# Patient Record
Sex: Female | Born: 1945 | Hispanic: No | Marital: Married | Smoking: Never smoker
Health system: Southern US, Community
[De-identification: ages and names within clinical notes are randomized; demographics above are authoritative.]

## PROBLEM LIST (undated history)

## (undated) DIAGNOSIS — I10 Essential (primary) hypertension: Secondary | ICD-10-CM

## (undated) DIAGNOSIS — D649 Anemia, unspecified: Secondary | ICD-10-CM

## (undated) HISTORY — PX: ABDOMINAL HYSTERECTOMY: SHX81

---

## 2009-07-20 ENCOUNTER — Ambulatory Visit: Payer: Self-pay | Admitting: Internal Medicine

## 2014-09-28 ENCOUNTER — Ambulatory Visit: Payer: Self-pay | Admitting: Family Medicine

## 2014-09-28 IMAGING — US US EXTREM LOW VENOUS*R*
1 series · 13 of 24 positions shown · non-contrast
Comparison: None.

CLINICAL DATA: Right lower extremity swelling and pain



[Series 1: us extrem low venous*right* · 0.06mm/px · 13 of 46 slices shown]
[im 1/46]
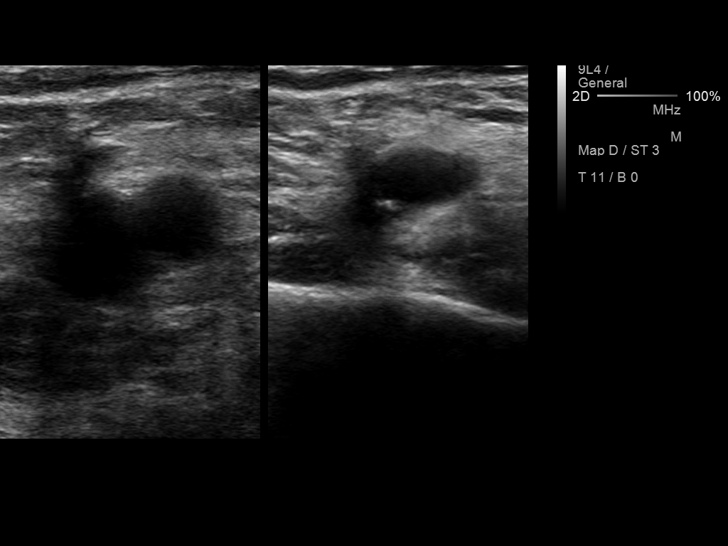
[im 4/46]
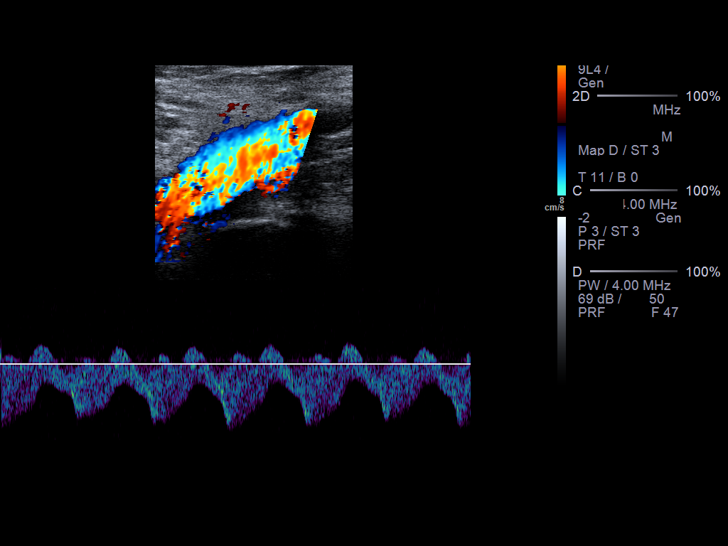
[im 8/46]
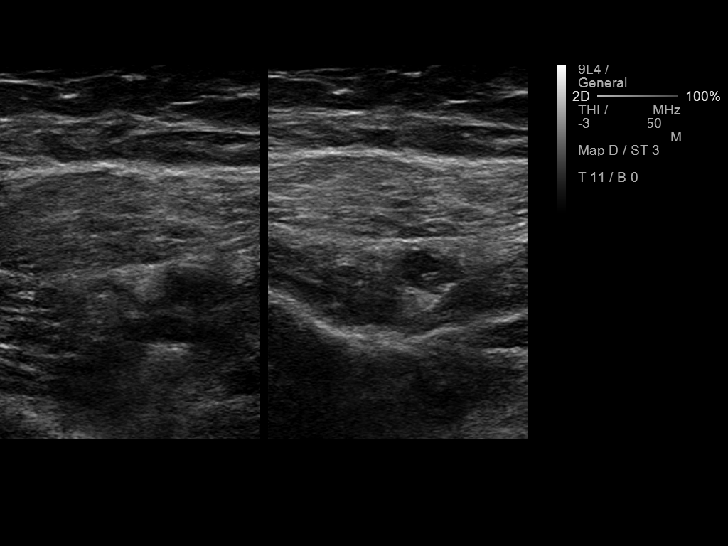
[im 12/46]
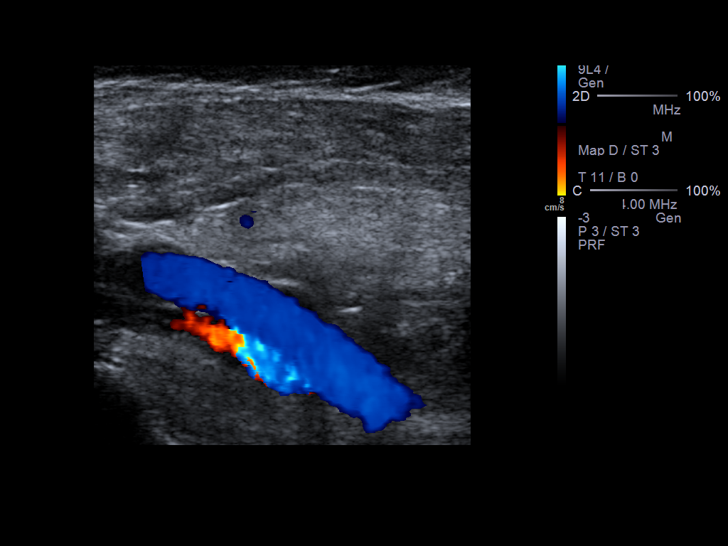
[im 16/46]
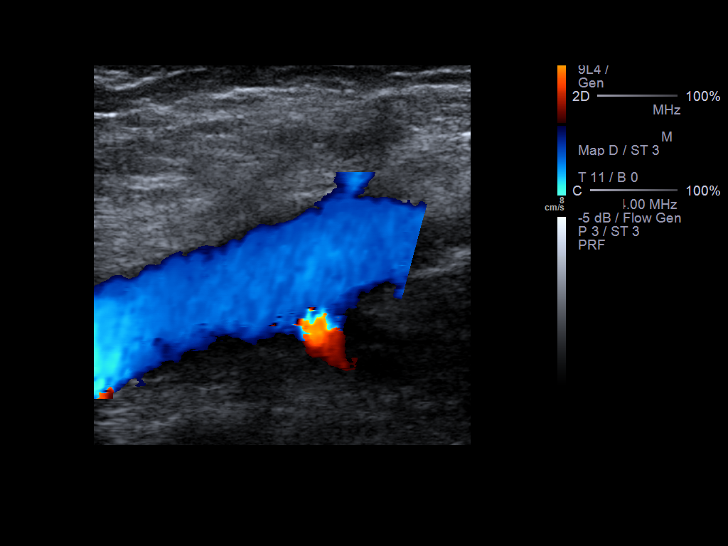
[im 20/46]
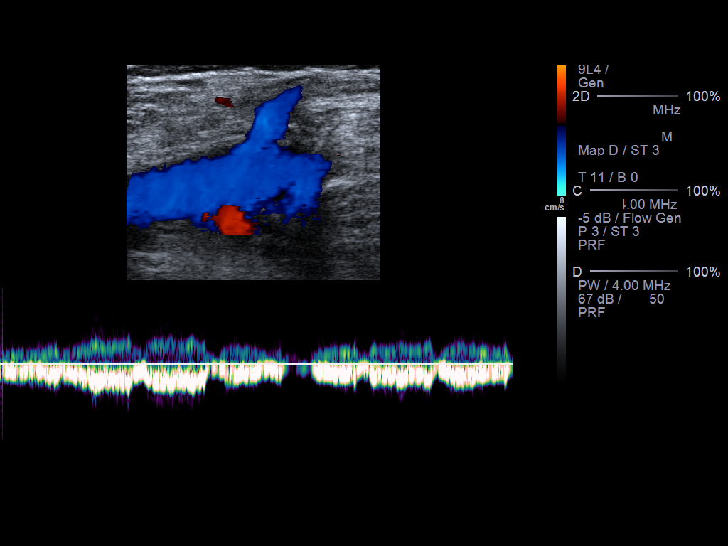
[im 24/46]
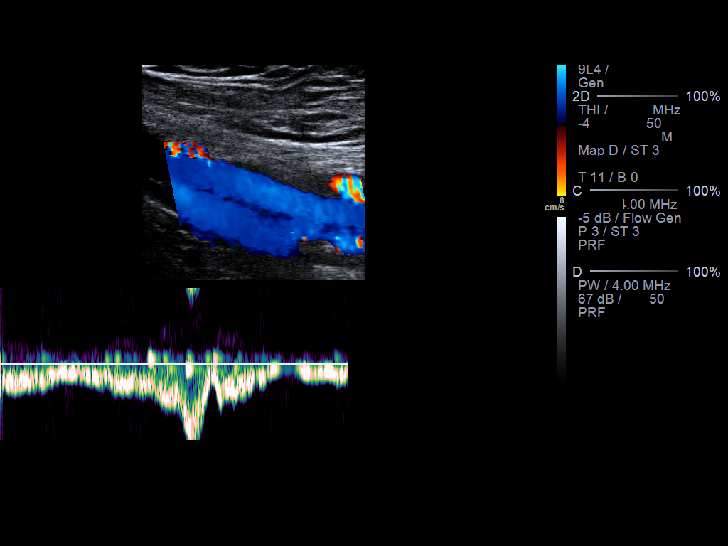
[im 26/46]
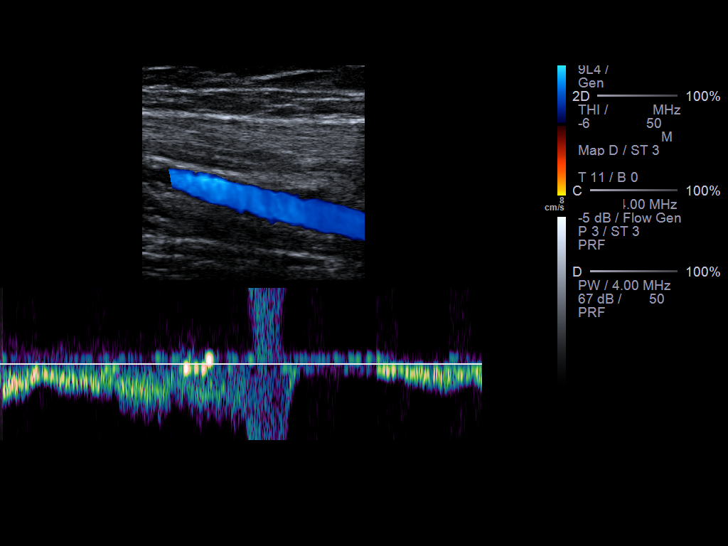
[im 30/46]
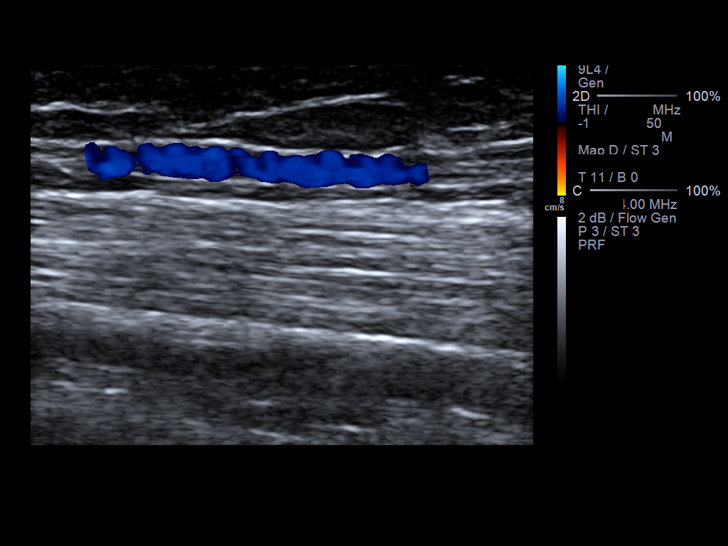
[im 34/46]
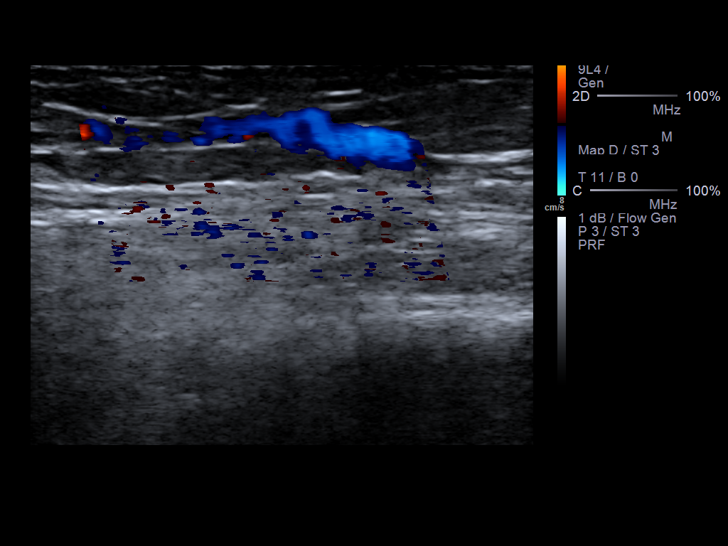
[im 38/46]
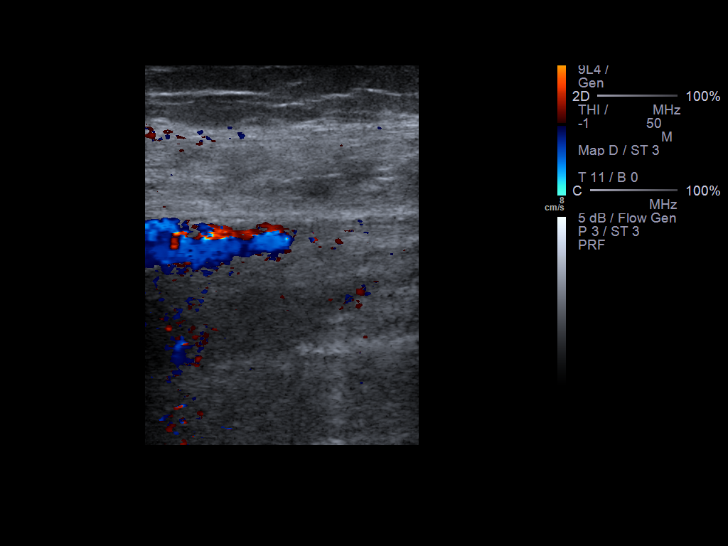
[im 42/46]
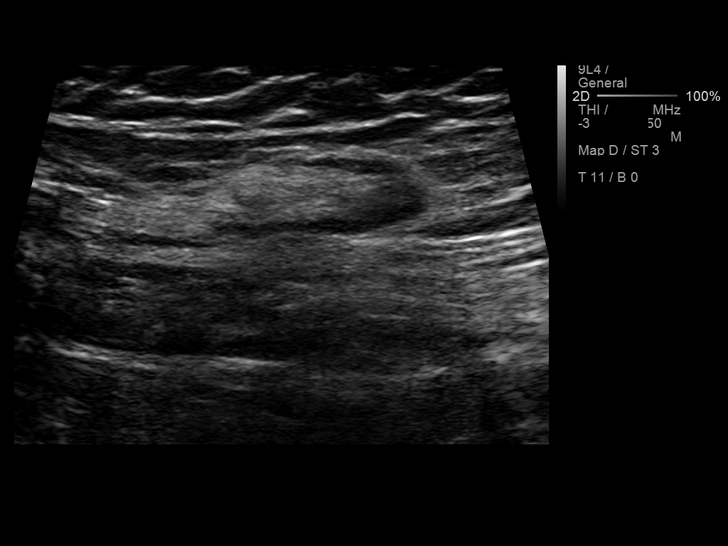
[im 46/46]
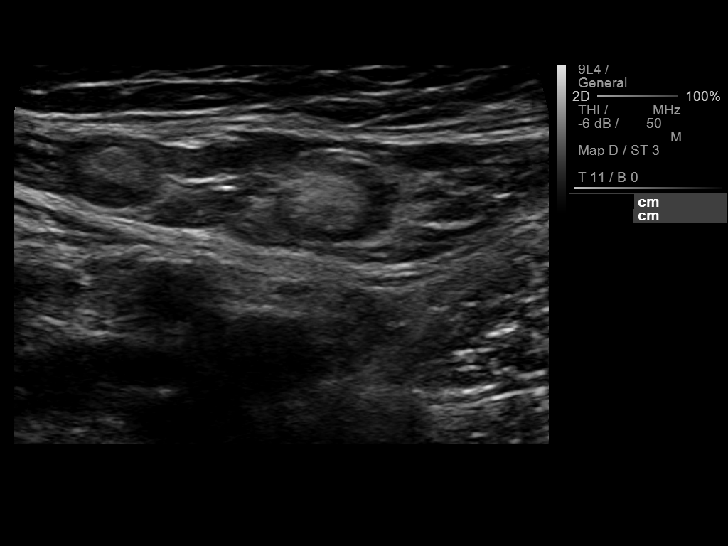

[13 of 24 positions shown; findings below may reference images not displayed]

FINDINGS: Contralateral Common Femoral Vein: Respiratory phasicity is normal
and symmetric with the symptomatic side. No evidence of thrombus.
Normal compressibility.

Common Femoral Vein: No evidence of thrombus. Normal
compressibility, respiratory phasicity and response to augmentation.

Saphenofemoral Junction: No evidence of thrombus. Normal
compressibility and flow on color Doppler imaging.

Profunda Femoral Vein: No evidence of thrombus. Normal
compressibility and flow on color Doppler imaging.

Femoral Vein: No evidence of thrombus. Normal compressibility,
respiratory phasicity and response to augmentation.

Popliteal Vein: No evidence of thrombus. Normal compressibility,
respiratory phasicity and response to augmentation.

Calf Veins: No evidence of thrombus. Normal compressibility and flow
on color Doppler imaging.

Superficial Great Saphenous Vein: No evidence of thrombus. Normal
compressibility and flow on color Doppler imaging.

Venous Reflux:  None.

Other Findings:  None.
IMPRESSION: No evidence of deep venous thrombosis.

These results will be called to the ordering clinician or
representative by the Radiologist Assistant, and communication
documented in the PACS or zVision Dashboard.

## 2014-12-25 ENCOUNTER — Emergency Department: Payer: Self-pay | Admitting: Emergency Medicine

## 2014-12-25 LAB — BASIC METABOLIC PANEL
ANION GAP: 8 (ref 7–16)
BUN: 15 mg/dL (ref 7–18)
Calcium, Total: 9.2 mg/dL (ref 8.5–10.1)
Chloride: 105 mmol/L (ref 98–107)
Co2: 28 mmol/L (ref 21–32)
Creatinine: 0.78 mg/dL (ref 0.60–1.30)
Glucose: 108 mg/dL — ABNORMAL HIGH (ref 65–99)
OSMOLALITY: 283 (ref 275–301)
Potassium: 3.5 mmol/L (ref 3.5–5.1)
Sodium: 141 mmol/L (ref 136–145)

## 2014-12-25 LAB — CBC WITH DIFFERENTIAL/PLATELET
Basophil #: 0.1 10*3/uL (ref 0.0–0.1)
Basophil %: 0.7 %
EOS ABS: 0.2 10*3/uL (ref 0.0–0.7)
Eosinophil %: 2.5 %
HCT: 40.9 % (ref 35.0–47.0)
HGB: 13.2 g/dL (ref 12.0–16.0)
LYMPHS PCT: 19 %
Lymphocyte #: 1.6 10*3/uL (ref 1.0–3.6)
MCH: 29 pg (ref 26.0–34.0)
MCHC: 32.3 g/dL (ref 32.0–36.0)
MCV: 90 fL (ref 80–100)
Monocyte #: 0.5 x10 3/mm (ref 0.2–0.9)
Monocyte %: 6 %
NEUTROS PCT: 71.8 %
Neutrophil #: 6.2 10*3/uL (ref 1.4–6.5)
Platelet: 224 10*3/uL (ref 150–440)
RBC: 4.56 10*6/uL (ref 3.80–5.20)
RDW: 14.6 % — ABNORMAL HIGH (ref 11.5–14.5)
WBC: 8.7 10*3/uL (ref 3.6–11.0)

## 2014-12-25 LAB — TROPONIN I
Troponin-I: <0.02 ng/mL
Troponin-I: <0.02 ng/mL

## 2015-05-08 ENCOUNTER — Other Ambulatory Visit: Payer: Self-pay | Admitting: Family Medicine

## 2015-05-08 ENCOUNTER — Ambulatory Visit
Admission: RE | Admit: 2015-05-08 | Discharge: 2015-05-08 | Disposition: A | Discharge status: Home / Self Care | Payer: Medicare Other | Source: Ambulatory Visit | Attending: Family Medicine | Admitting: Family Medicine

## 2015-05-08 DIAGNOSIS — Z1231 Encounter for screening mammogram for malignant neoplasm of breast: Secondary | ICD-10-CM

## 2015-05-08 IMAGING — MG MM DIGITAL SCREENING BILAT W/ CAD
5 series · 5 of 5 positions shown · non-contrast
Comparison: Outside prior exams from [place] are not available

CLINICAL DATA: Screening.

EXAM:
DIGITAL SCREENING BILATERAL MAMMOGRAM WITH CAD

[R MLO]
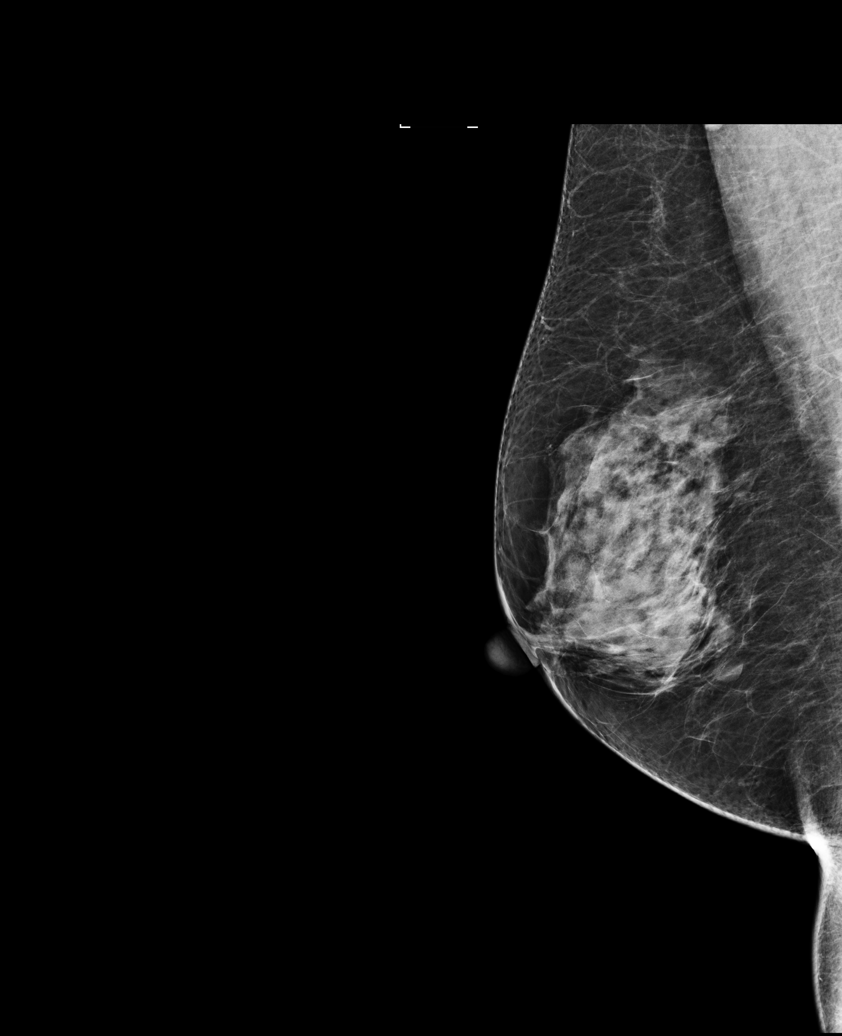

[L MLO]
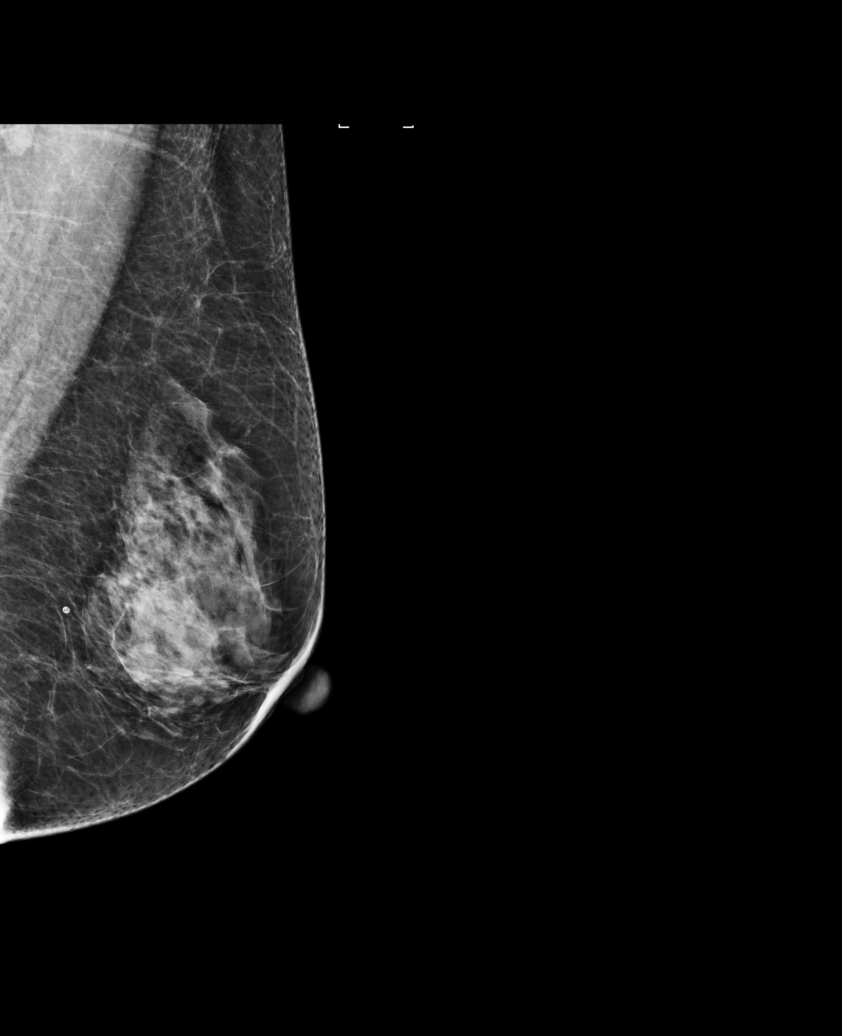

[L XCCL]
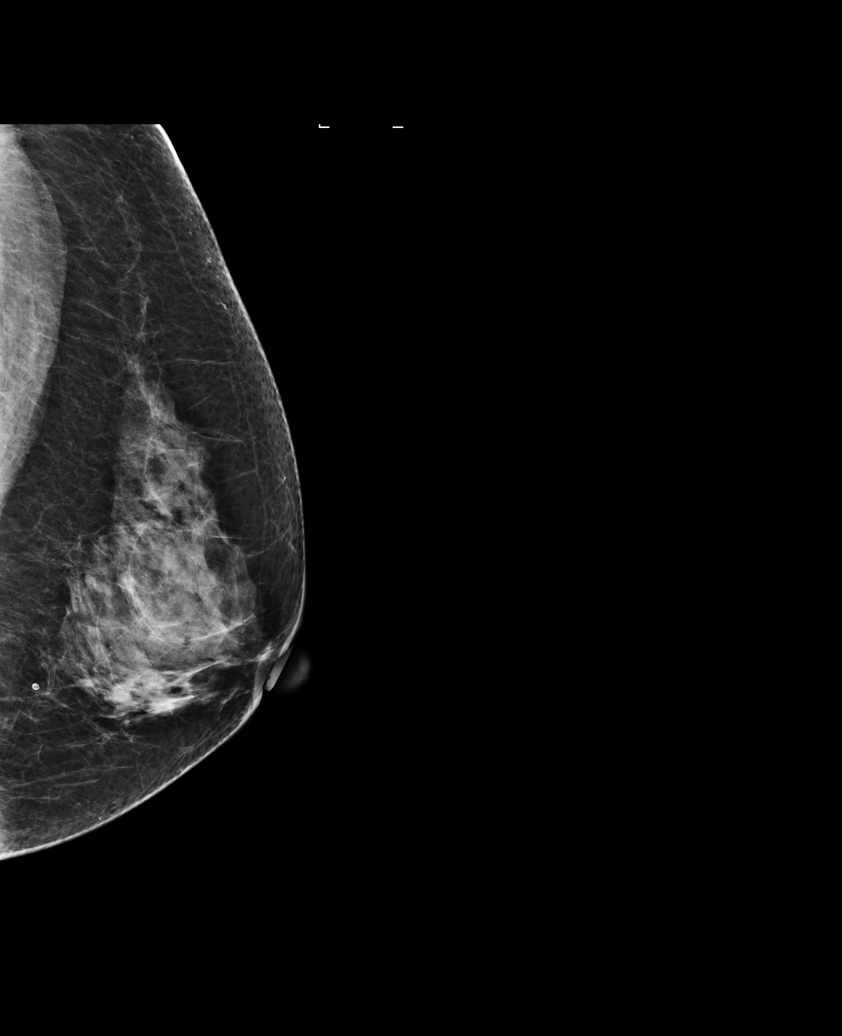

[L CC]
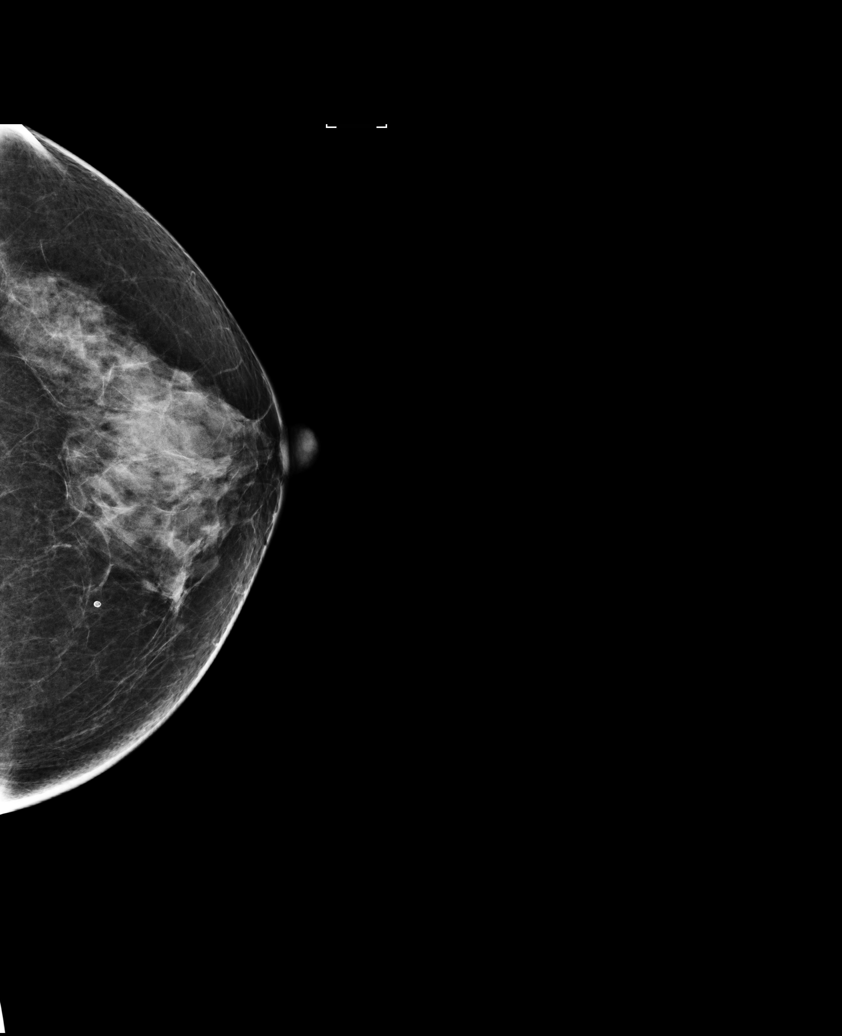

[R CC]
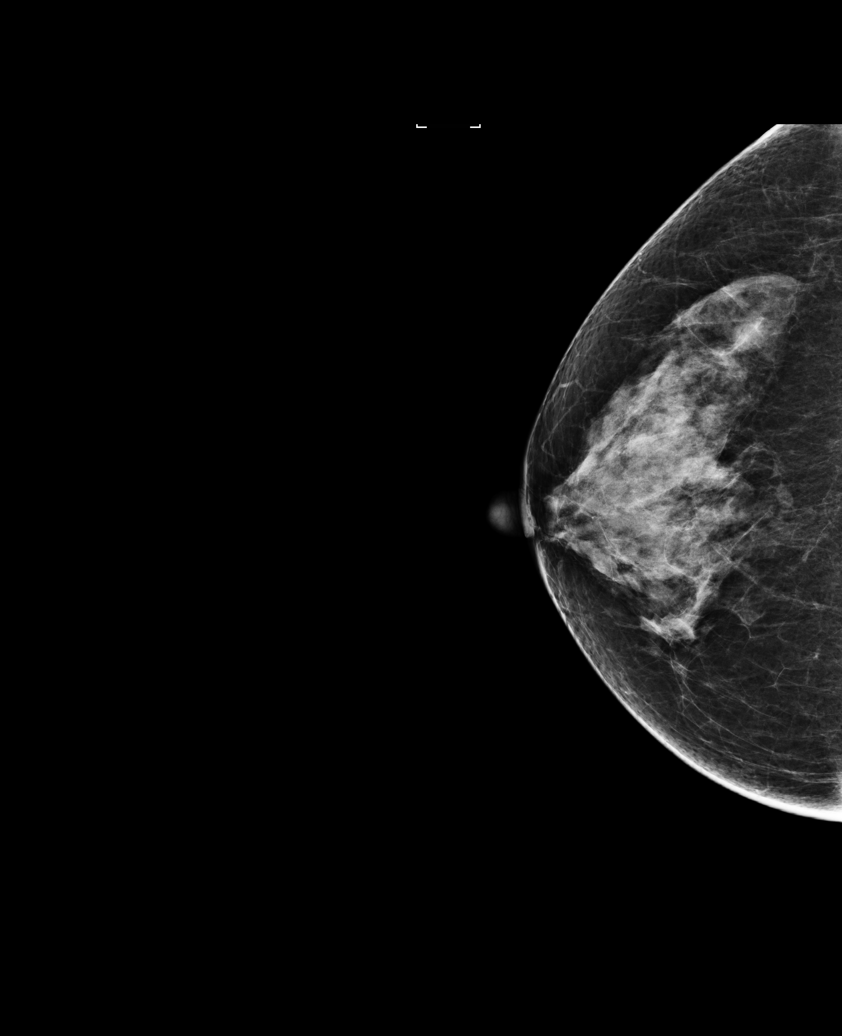

[5 of 5 positions shown; findings below may reference images not displayed]

ACR Breast Density Category d: The breast tissue is extremely dense,
which lowers the sensitivity of mammography.
FINDINGS: There are no findings suspicious for malignancy. Images were
processed with CAD.
IMPRESSION: No mammographic evidence of malignancy. A result letter of this
screening mammogram will be mailed directly to the patient.

RECOMMENDATION:
Screening mammogram in one year. (Code:[W9])

BI-RADS CATEGORY  1: Negative.

## 2015-12-31 ENCOUNTER — Other Ambulatory Visit: Payer: Self-pay | Admitting: Family Medicine

## 2015-12-31 DIAGNOSIS — N644 Mastodynia: Secondary | ICD-10-CM

## 2016-01-01 ENCOUNTER — Ambulatory Visit: Payer: Medicare Other

## 2016-01-01 ENCOUNTER — Other Ambulatory Visit: Payer: Self-pay | Admitting: Family Medicine

## 2016-01-01 DIAGNOSIS — M7989 Other specified soft tissue disorders: Principal | ICD-10-CM

## 2016-01-01 DIAGNOSIS — M79661 Pain in right lower leg: Secondary | ICD-10-CM

## 2016-01-13 ENCOUNTER — Other Ambulatory Visit: Payer: Medicare Other

## 2016-01-13 ENCOUNTER — Ambulatory Visit: Payer: Medicare Other | Attending: Family Medicine

## 2016-01-17 ENCOUNTER — Other Ambulatory Visit: Payer: Self-pay | Admitting: Family Medicine

## 2016-01-17 DIAGNOSIS — Z13820 Encounter for screening for osteoporosis: Secondary | ICD-10-CM

## 2016-04-29 ENCOUNTER — Other Ambulatory Visit: Payer: Self-pay | Admitting: Family Medicine

## 2016-04-29 DIAGNOSIS — Z1231 Encounter for screening mammogram for malignant neoplasm of breast: Secondary | ICD-10-CM

## 2016-05-27 ENCOUNTER — Ambulatory Visit: Payer: Medicare Other

## 2016-07-21 ENCOUNTER — Other Ambulatory Visit: Payer: Self-pay | Admitting: Family Medicine

## 2016-07-21 ENCOUNTER — Ambulatory Visit
Admission: RE | Admit: 2016-07-21 | Discharge: 2016-07-21 | Disposition: A | Discharge status: Home / Self Care | Payer: Medicare Other | Source: Ambulatory Visit | Attending: Family Medicine | Admitting: Family Medicine

## 2016-07-21 DIAGNOSIS — N959 Unspecified menopausal and perimenopausal disorder: Secondary | ICD-10-CM | POA: Insufficient documentation

## 2016-07-21 DIAGNOSIS — Z1231 Encounter for screening mammogram for malignant neoplasm of breast: Secondary | ICD-10-CM

## 2016-07-21 DIAGNOSIS — M8580 Other specified disorders of bone density and structure, unspecified site: Secondary | ICD-10-CM | POA: Diagnosis not present

## 2016-07-21 DIAGNOSIS — Z13820 Encounter for screening for osteoporosis: Secondary | ICD-10-CM | POA: Diagnosis present

## 2016-07-21 IMAGING — MG MM DIGITAL SCREENING BILAT W/ TOMO W/ CAD
9 of 12 series · 9 of 28 positions shown · non-contrast
Comparison: Previous exam(s).

CLINICAL DATA: Screening.

EXAM:
2D DIGITAL SCREENING BILATERAL MAMMOGRAM WITH CAD AND ADJUNCT TOMO

[L CC]
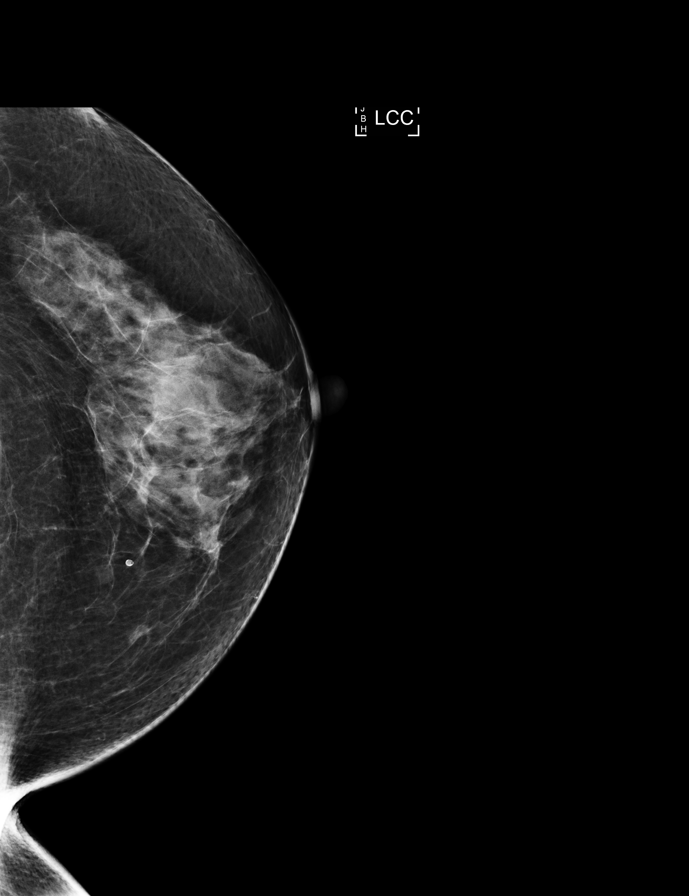

[L CC synth-2D]
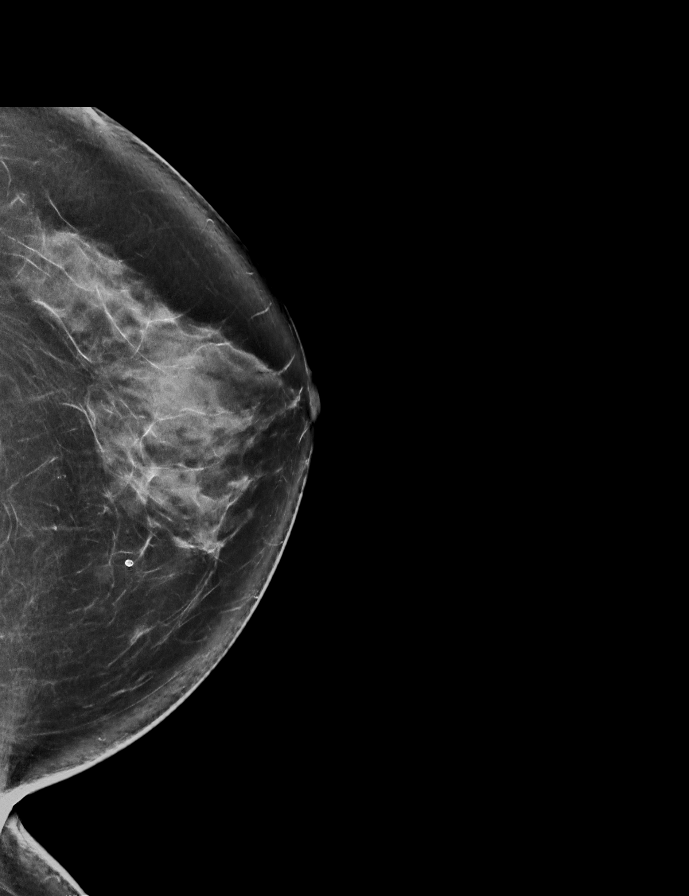

[R CC synth-2D]
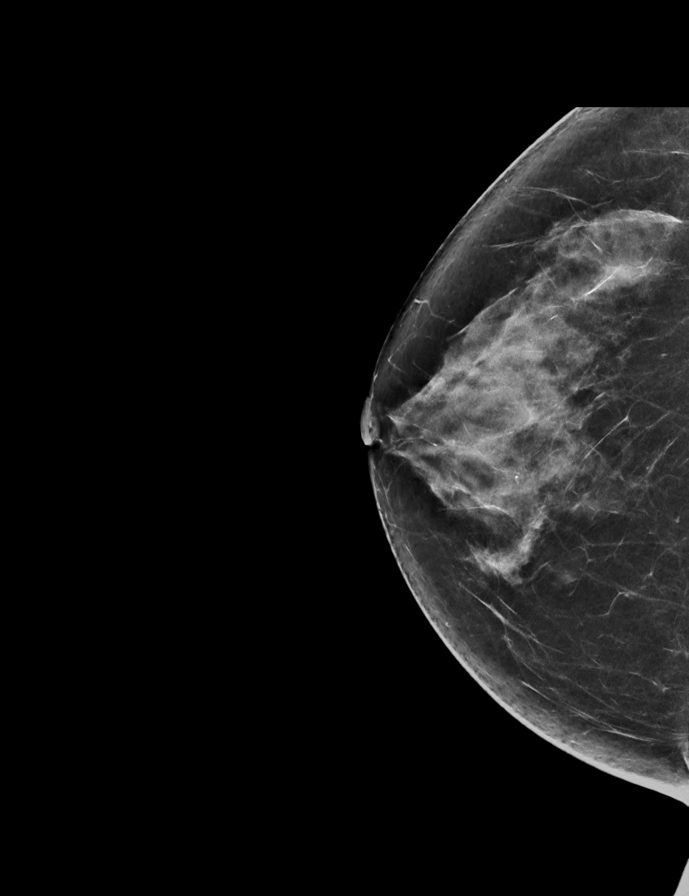

[L MLO synth-2D]
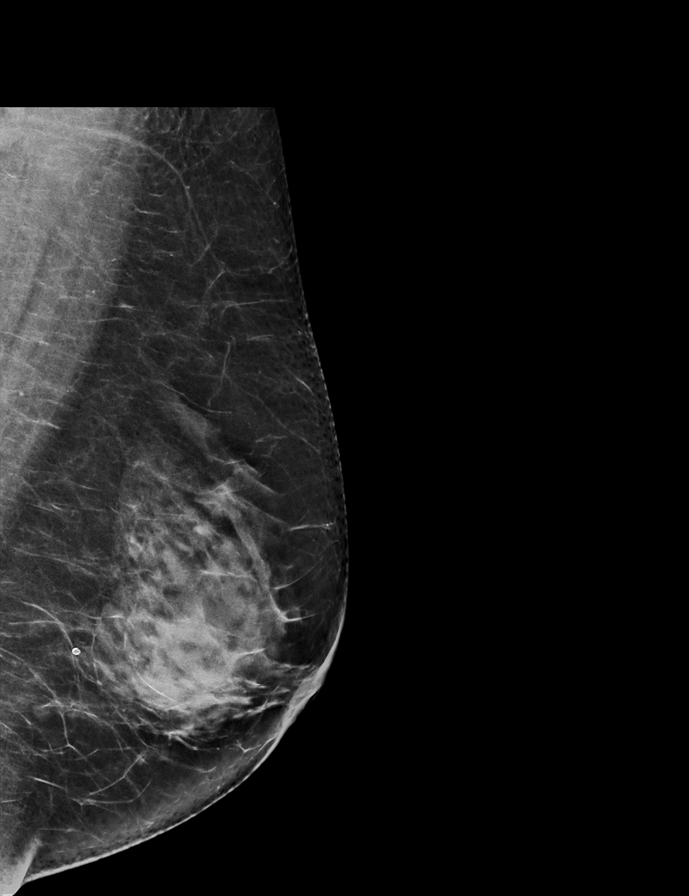

[R MLO]
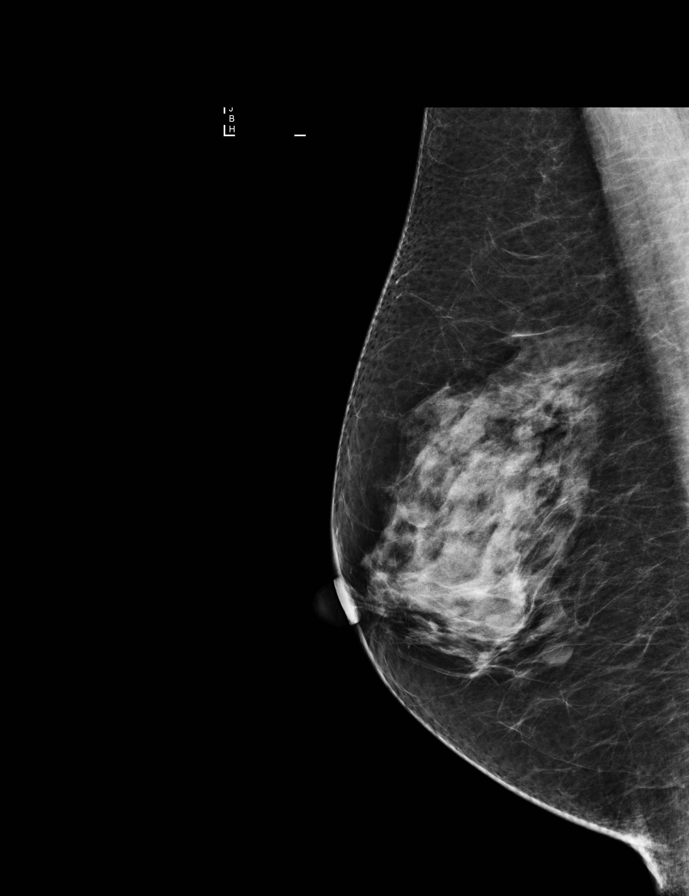

[L MLO]
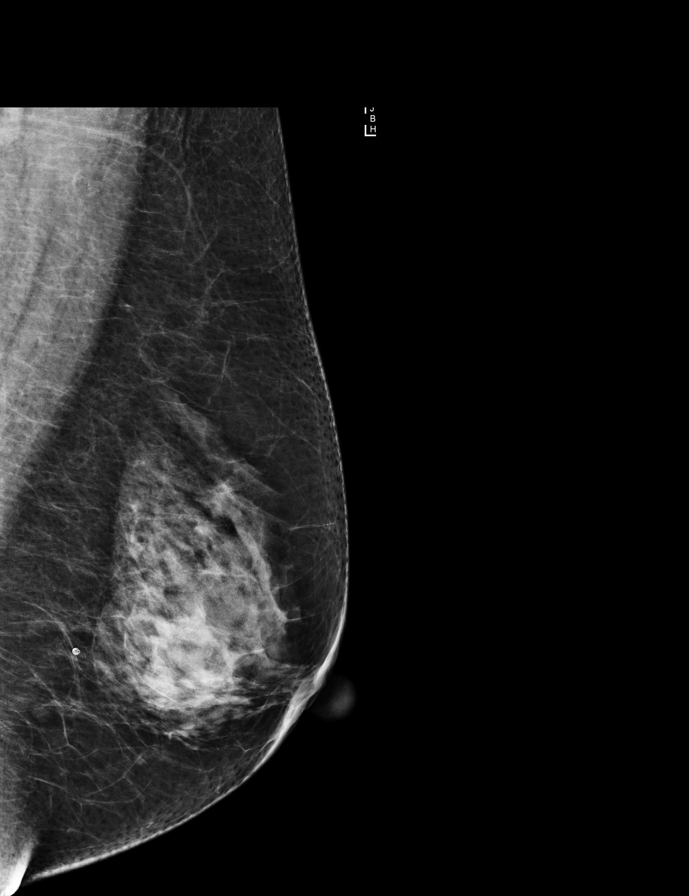

[R MLO synth-2D]
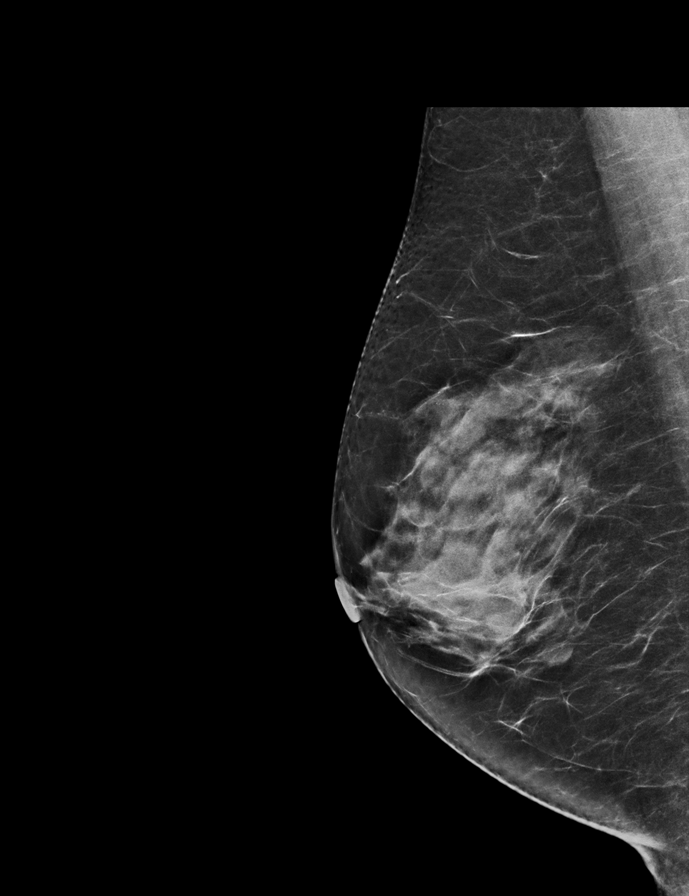

[R CC]
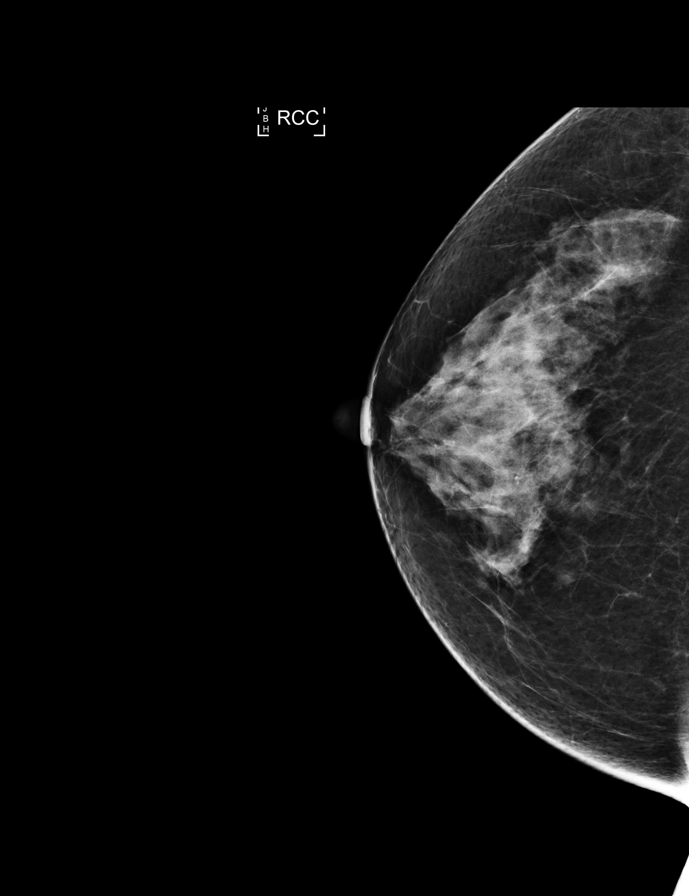

[L CC tomo · tomo slice 40/79.0]
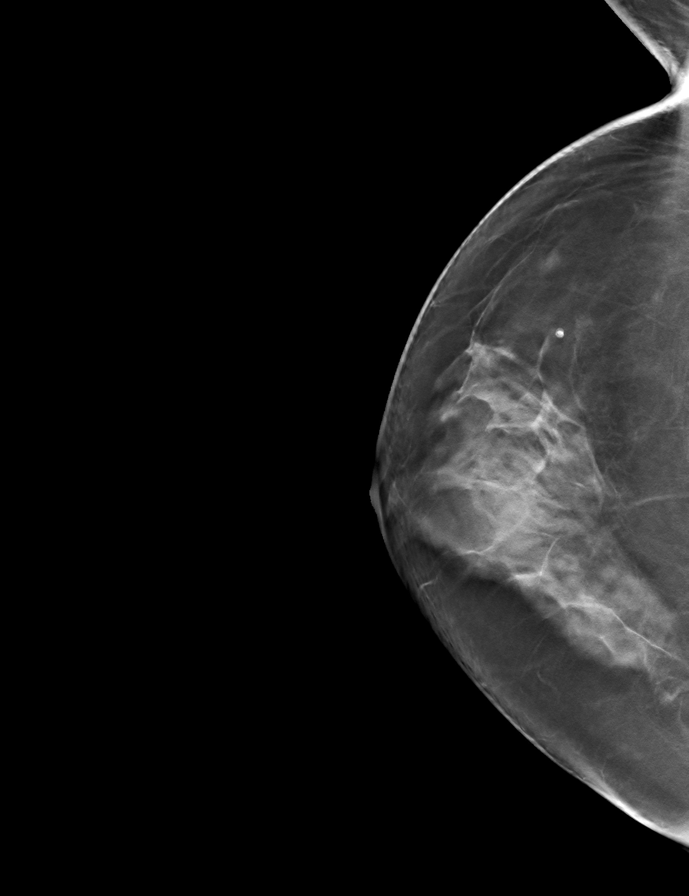

[9 of 28 positions shown; findings below may reference images not displayed]

ACR Breast Density Category c: The breast tissue is heterogeneously
dense, which may obscure small masses.
FINDINGS: There are no findings suspicious for malignancy. Images were
processed with CAD.
IMPRESSION: No mammographic evidence of malignancy. A result letter of this
screening mammogram will be mailed directly to the patient.

RECOMMENDATION:
Screening mammogram in one year. (Code:[TA])

BI-RADS CATEGORY  1: Negative.

## 2019-02-20 ENCOUNTER — Other Ambulatory Visit: Payer: Self-pay | Admitting: Orthopedic Surgery

## 2019-02-20 DIAGNOSIS — M4807 Spinal stenosis, lumbosacral region: Secondary | ICD-10-CM

## 2019-02-20 DIAGNOSIS — M545 Low back pain, unspecified: Secondary | ICD-10-CM

## 2019-03-16 ENCOUNTER — Ambulatory Visit
Admission: RE | Admit: 2019-03-16 | Discharge: 2019-03-16 | Disposition: A | Discharge status: Home / Self Care | Payer: Medicare Other | Source: Ambulatory Visit | Attending: Orthopedic Surgery | Admitting: Orthopedic Surgery

## 2019-03-16 ENCOUNTER — Ambulatory Visit: Payer: Medicare Other

## 2019-03-16 ENCOUNTER — Other Ambulatory Visit: Payer: Self-pay

## 2019-03-16 DIAGNOSIS — M545 Low back pain, unspecified: Secondary | ICD-10-CM

## 2019-03-16 DIAGNOSIS — M4807 Spinal stenosis, lumbosacral region: Secondary | ICD-10-CM | POA: Diagnosis not present

## 2019-03-16 IMAGING — MR MRI LUMBAR SPINE WITHOUT CONTRAST
5 of 7 series · 31 of 48 positions shown · non-contrast
Comparison: None.

CLINICAL DATA: Low back pain and bilateral lower extremity pain

EXAM:
MRI LUMBAR SPINE WITHOUT CONTRAST
TECHNIQUE: Multiplanar, multisequence MR imaging of the lumbar spine was
performed. No intravenous contrast was administered.

[Series 5: T2 · sagittal · 4.0mm · 0.75mm/px · 8 of 17 slices shown (1 of 3)]
[im 1/17]
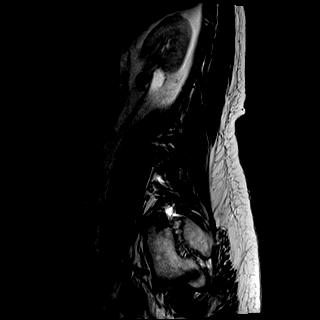
[im 3/17]
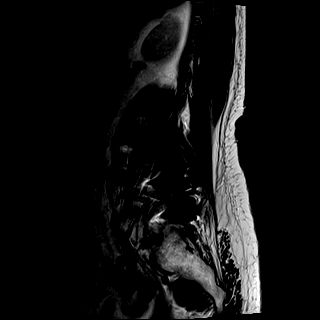
[im 5/17]
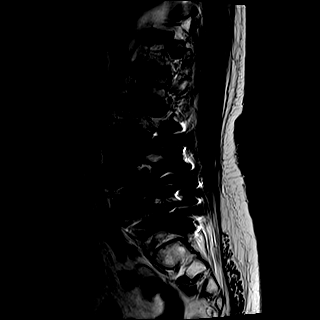
[im 7/17]
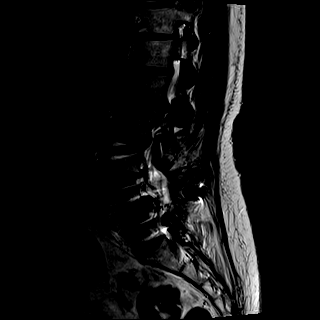
[im 10/17]
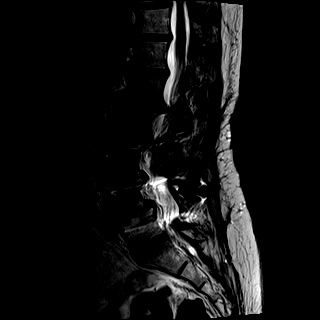
[im 12/17]
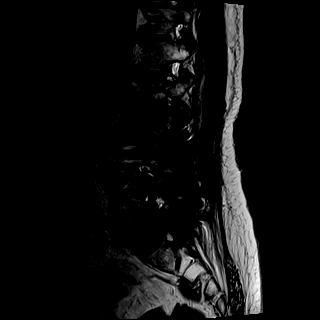
[im 14/17]
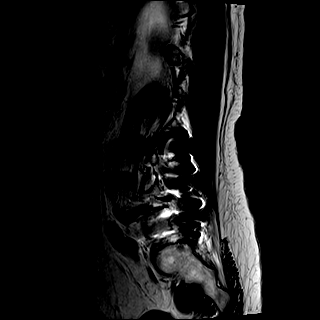
[im 17/17]
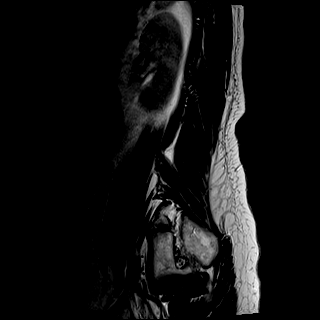

[Series 6: T1 · sagittal · 4.0mm · 0.81mm/px · 8 of 17 slices shown (1 of 2)]
[im 1/17]
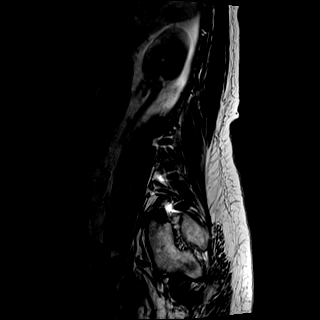
[im 3/17]
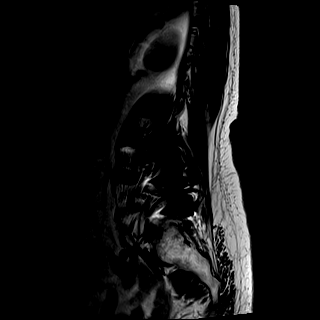
[im 5/17]
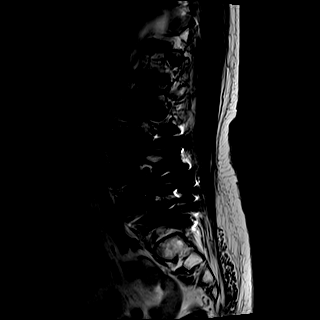
[im 7/17]
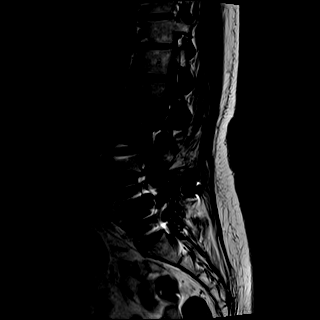
[im 10/17]
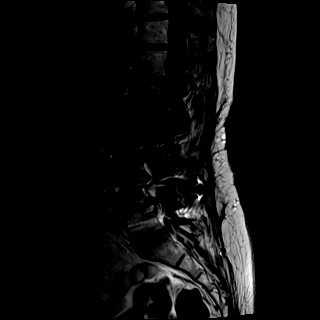
[im 12/17]
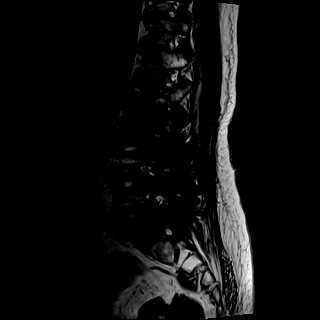
[im 14/17]
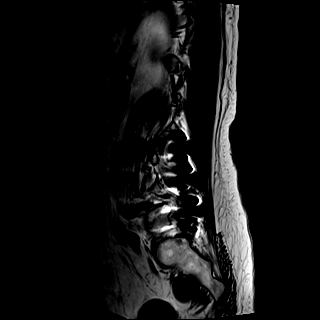
[im 17/17]
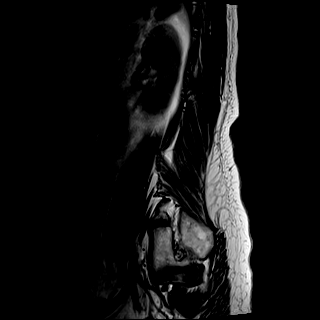

[Series 8: T2 · axial · 4.0mm · 0.57mm/px · z∈[+1,+55]mm · 5 of 12 slices shown (2 of 3)]
[im 1/12]
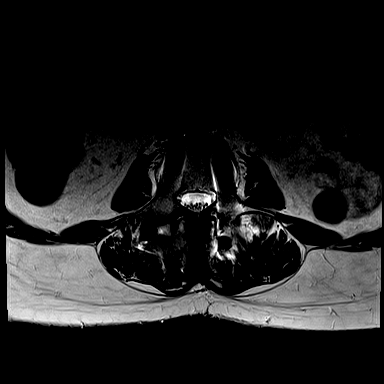
[im 3/12]
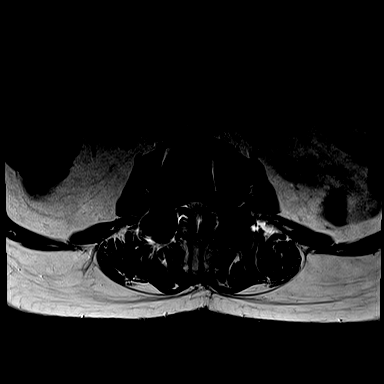
[im 6/12]
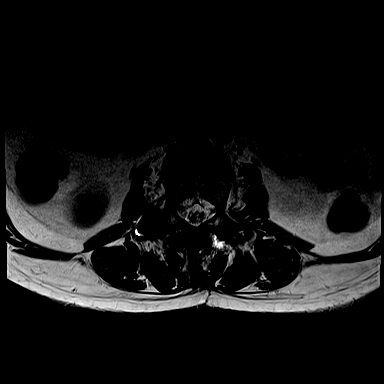
[im 9/12]
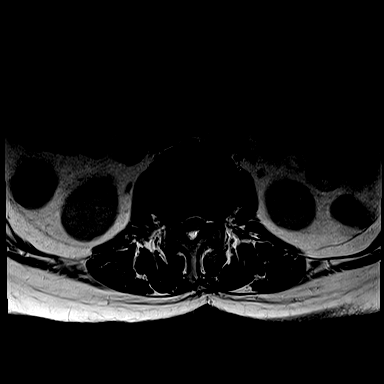
[im 12/12]
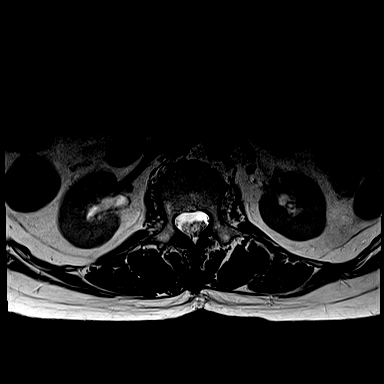

[Series 9: T2 · axial · 4.0mm · 0.57mm/px · z∈[-105,-27]mm · 7 of 17 slices shown (3 of 3)]
[im 1/17]
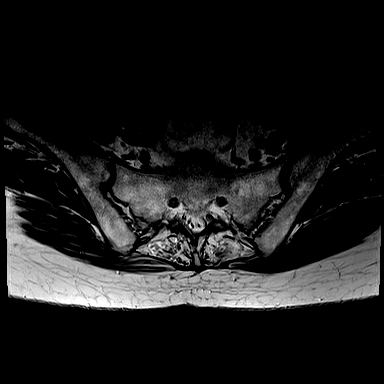
[im 3/17]
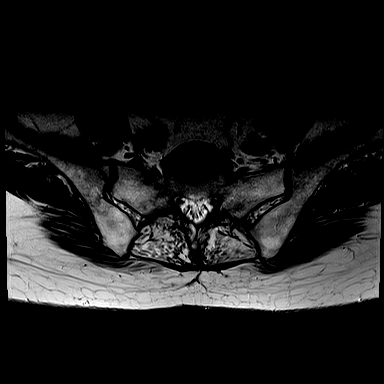
[im 6/17]
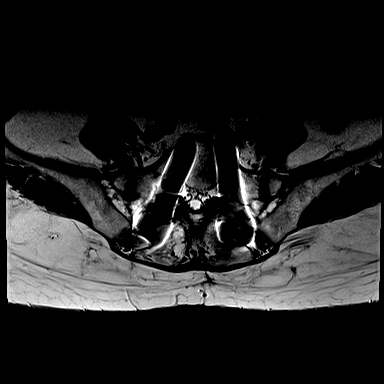
[im 9/17]
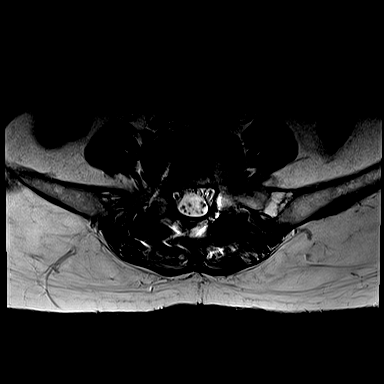
[im 11/17]
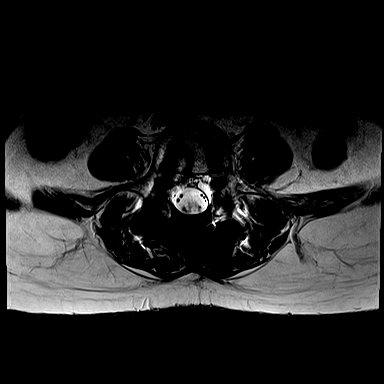
[im 14/17]
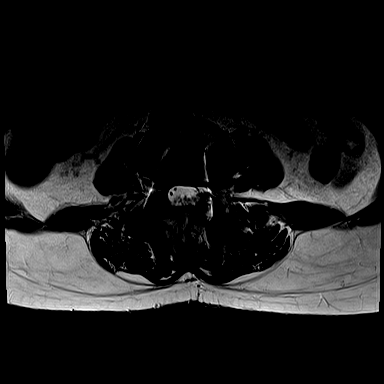
[im 17/17]
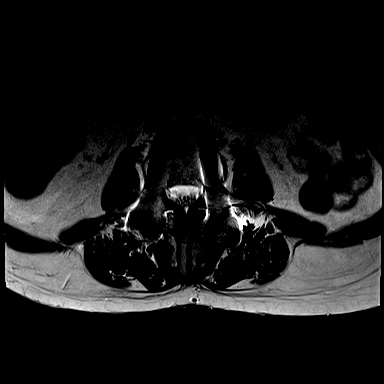

[Series 10: T1 · axial · 4.0mm · 0.34mm/px · z∈[+1,+26]mm · 3 of 12 slices shown (2 of 2)]
[im 1/12]
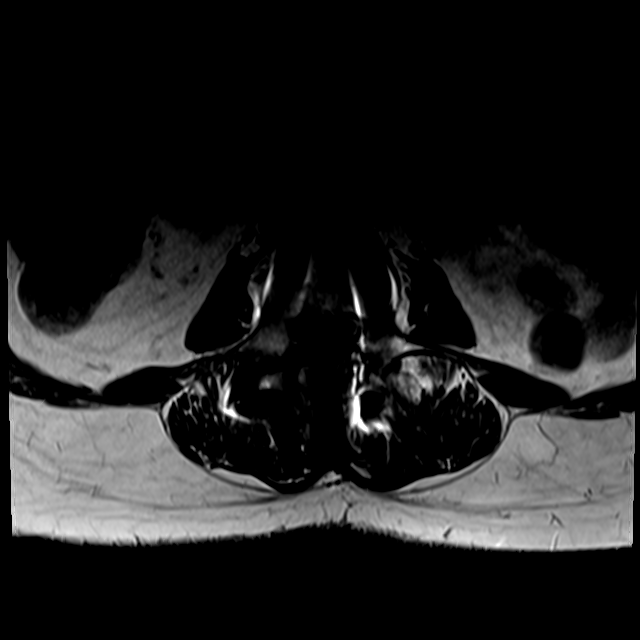
[im 3/12]
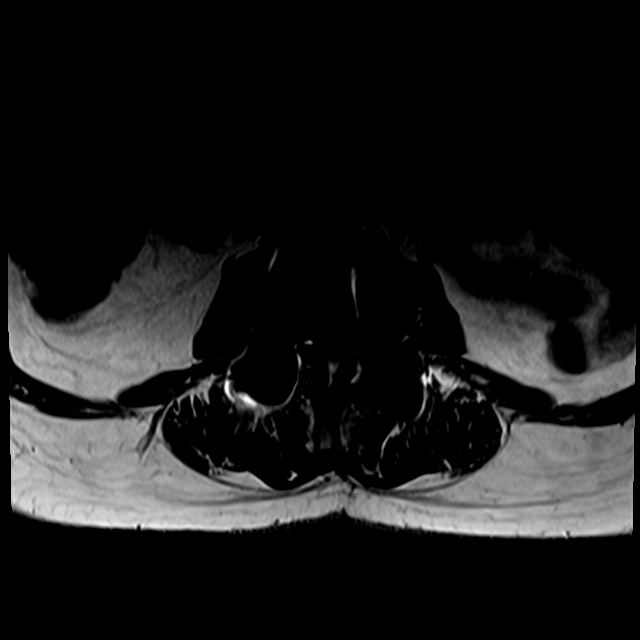
[im 6/12]
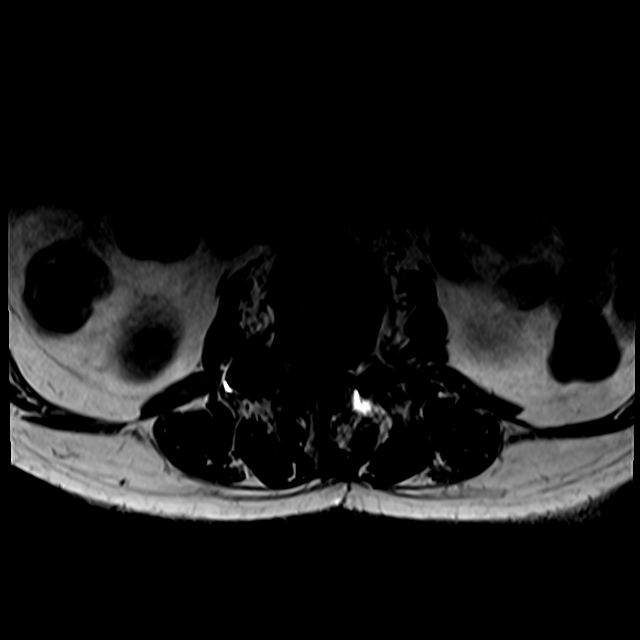

[31 of 48 positions shown; findings below may reference images not displayed]

FINDINGS: Segmentation:  Standard.

Alignment: There is grade 1 L2-L3 retrolisthesis and grade 1
anterolisthesis at L3-4 and L4-5.

Vertebrae: There is L3-L5 PLIF. Degenerative endplate changes at
L1-2. No acute fracture or discitis-osteomyelitis.

Conus medullaris and cauda equina: Conus extends to the T12-L1
level. Conus and cauda equina appear normal.

Paraspinal and other soft tissues: Negative.

Disc levels:

T10-11: Small disc bulge without spinal canal or neural foraminal
stenosis.

T11-12: Small disc bulge without spinal canal or neural foraminal
stenosis.

L1-2: Large disc osteophyte complex with moderate spinal canal
stenosis. Moderate right and severe left neural foraminal stenosis.
Mild facet hypertrophy.

L2-3: Disc desiccation with mild bulge. Mild spinal canal stenosis.
Mild right and moderate left neural foraminal stenosis.

L3-4: Disc desiccation and small bulge with postfusion changes. No
spinal canal stenosis. Moderate right and mild left neural foraminal
stenosis.

L4-5: Intermediate disc bulge and postfusion changes. No spinal
canal stenosis. Severe right and mild left neural foraminal
stenosis.

L5-S1: Normal disc.  No spinal canal or neural foraminal stenosis.
IMPRESSION: 1. L1-2 moderate spinal canal stenosis with moderate right and
severe left neural foraminal stenosis.
2. L2-3 mild spinal canal stenosis and mild right, moderate left
neural foraminal stenosis.
3. L3-4 moderate right neural foraminal stenosis.
4. L4-5 severe right neural foraminal stenosis.
5. Grade 1 L2-3 retrolisthesis and grade 1 L3-4, L4-5
anterolisthesis.

## 2019-04-14 ENCOUNTER — Emergency Department: Payer: Medicare Other

## 2019-04-14 ENCOUNTER — Encounter: Payer: Self-pay | Admitting: Emergency Medicine

## 2019-04-14 ENCOUNTER — Inpatient Hospital Stay
Admission: EM | Admit: 2019-04-14 | Discharge: 2019-04-30 | DRG: 871 | Disposition: A | Discharge status: Home Health | Payer: Medicare Other | Attending: Internal Medicine | Admitting: Internal Medicine

## 2019-04-14 ENCOUNTER — Other Ambulatory Visit: Payer: Self-pay

## 2019-04-14 ENCOUNTER — Observation Stay: Payer: Medicare Other

## 2019-04-14 DIAGNOSIS — G8929 Other chronic pain: Secondary | ICD-10-CM | POA: Diagnosis present

## 2019-04-14 DIAGNOSIS — I480 Paroxysmal atrial fibrillation: Secondary | ICD-10-CM | POA: Diagnosis present

## 2019-04-14 DIAGNOSIS — Z20828 Contact with and (suspected) exposure to other viral communicable diseases: Secondary | ICD-10-CM | POA: Diagnosis present

## 2019-04-14 DIAGNOSIS — E876 Hypokalemia: Secondary | ICD-10-CM | POA: Diagnosis present

## 2019-04-14 DIAGNOSIS — N179 Acute kidney failure, unspecified: Secondary | ICD-10-CM

## 2019-04-14 DIAGNOSIS — R7989 Other specified abnormal findings of blood chemistry: Secondary | ICD-10-CM

## 2019-04-14 DIAGNOSIS — D62 Acute posthemorrhagic anemia: Secondary | ICD-10-CM | POA: Diagnosis not present

## 2019-04-14 DIAGNOSIS — K2900 Acute gastritis without bleeding: Secondary | ICD-10-CM

## 2019-04-14 DIAGNOSIS — K254 Chronic or unspecified gastric ulcer with hemorrhage: Secondary | ICD-10-CM | POA: Diagnosis present

## 2019-04-14 DIAGNOSIS — A040 Enteropathogenic Escherichia coli infection: Secondary | ICD-10-CM | POA: Diagnosis present

## 2019-04-14 DIAGNOSIS — G459 Transient cerebral ischemic attack, unspecified: Secondary | ICD-10-CM | POA: Diagnosis not present

## 2019-04-14 DIAGNOSIS — Z23 Encounter for immunization: Secondary | ICD-10-CM

## 2019-04-14 DIAGNOSIS — K567 Ileus, unspecified: Secondary | ICD-10-CM

## 2019-04-14 DIAGNOSIS — R571 Hypovolemic shock: Secondary | ICD-10-CM | POA: Diagnosis not present

## 2019-04-14 DIAGNOSIS — N170 Acute kidney failure with tubular necrosis: Secondary | ICD-10-CM | POA: Diagnosis not present

## 2019-04-14 DIAGNOSIS — E860 Dehydration: Secondary | ICD-10-CM

## 2019-04-14 DIAGNOSIS — D6959 Other secondary thrombocytopenia: Secondary | ICD-10-CM | POA: Diagnosis present

## 2019-04-14 DIAGNOSIS — R6520 Severe sepsis without septic shock: Secondary | ICD-10-CM | POA: Diagnosis present

## 2019-04-14 DIAGNOSIS — N183 Chronic kidney disease, stage 3 (moderate): Secondary | ICD-10-CM | POA: Diagnosis present

## 2019-04-14 DIAGNOSIS — D500 Iron deficiency anemia secondary to blood loss (chronic): Secondary | ICD-10-CM

## 2019-04-14 DIAGNOSIS — K264 Chronic or unspecified duodenal ulcer with hemorrhage: Secondary | ICD-10-CM | POA: Diagnosis present

## 2019-04-14 DIAGNOSIS — R29700 NIHSS score 0: Secondary | ICD-10-CM | POA: Diagnosis present

## 2019-04-14 DIAGNOSIS — R29810 Facial weakness: Secondary | ICD-10-CM | POA: Diagnosis not present

## 2019-04-14 DIAGNOSIS — M8580 Other specified disorders of bone density and structure, unspecified site: Secondary | ICD-10-CM | POA: Diagnosis present

## 2019-04-14 DIAGNOSIS — K648 Other hemorrhoids: Secondary | ICD-10-CM | POA: Diagnosis present

## 2019-04-14 DIAGNOSIS — I9589 Other hypotension: Secondary | ICD-10-CM | POA: Diagnosis present

## 2019-04-14 DIAGNOSIS — R945 Abnormal results of liver function studies: Secondary | ICD-10-CM | POA: Diagnosis present

## 2019-04-14 DIAGNOSIS — Z7982 Long term (current) use of aspirin: Secondary | ICD-10-CM

## 2019-04-14 DIAGNOSIS — A4151 Sepsis due to Escherichia coli [E. coli]: Principal | ICD-10-CM | POA: Diagnosis present

## 2019-04-14 DIAGNOSIS — K644 Residual hemorrhoidal skin tags: Secondary | ICD-10-CM | POA: Diagnosis present

## 2019-04-14 DIAGNOSIS — I129 Hypertensive chronic kidney disease with stage 1 through stage 4 chronic kidney disease, or unspecified chronic kidney disease: Secondary | ICD-10-CM | POA: Diagnosis present

## 2019-04-14 DIAGNOSIS — D696 Thrombocytopenia, unspecified: Secondary | ICD-10-CM

## 2019-04-14 DIAGNOSIS — D649 Anemia, unspecified: Secondary | ICD-10-CM

## 2019-04-14 DIAGNOSIS — Z452 Encounter for adjustment and management of vascular access device: Secondary | ICD-10-CM

## 2019-04-14 DIAGNOSIS — K529 Noninfective gastroenteritis and colitis, unspecified: Secondary | ICD-10-CM | POA: Diagnosis present

## 2019-04-14 HISTORY — DX: Essential (primary) hypertension: I10

## 2019-04-14 HISTORY — DX: Anemia, unspecified: D64.9

## 2019-04-14 LAB — TROPONIN I: Troponin I: 0.04 ng/mL (ref <0.03)

## 2019-04-14 LAB — URINALYSIS, COMPLETE (UACMP) WITH MICROSCOPIC
Bacteria, UA: NONE SEEN
Bilirubin Urine: NEGATIVE
Glucose, UA: NEGATIVE mg/dL
Ketones, ur: 5 mg/dL — AB
Leukocytes,Ua: NEGATIVE
Nitrite: NEGATIVE
Protein, ur: 100 mg/dL — AB
Specific Gravity, Urine: 1.014 (ref 1.005–1.030)
Squamous Epithelial / HPF: NONE SEEN (ref 0–5)
pH: 6 (ref 5.0–8.0)

## 2019-04-14 LAB — COMPREHENSIVE METABOLIC PANEL
ALT: 78 U/L — ABNORMAL HIGH (ref 0–44)
AST: 102 U/L — ABNORMAL HIGH (ref 15–41)
Albumin: 3.5 g/dL (ref 3.5–5.0)
Alkaline Phosphatase: 130 U/L — ABNORMAL HIGH (ref 38–126)
Anion gap: 11 (ref 5–15)
BUN: 27 mg/dL — ABNORMAL HIGH (ref 8–23)
CO2: 27 mmol/L (ref 22–32)
Calcium: 9.1 mg/dL (ref 8.9–10.3)
Chloride: 97 mmol/L — ABNORMAL LOW (ref 98–111)
Creatinine, Ser: 1.3 mg/dL — ABNORMAL HIGH (ref 0.44–1.00)
GFR calc Af Amer: 47 mL/min — ABNORMAL LOW (ref >60)
GFR calc non Af Amer: 41 mL/min — ABNORMAL LOW (ref >60)
Glucose, Bld: 113 mg/dL — ABNORMAL HIGH (ref 70–99)
Potassium: 2.2 mmol/L — CL (ref 3.5–5.1)
Sodium: 135 mmol/L (ref 135–145)
Total Bilirubin: 1.1 mg/dL (ref 0.3–1.2)
Total Protein: 7.4 g/dL (ref 6.5–8.1)

## 2019-04-14 LAB — LIPASE, BLOOD: Lipase: 26 U/L (ref 11–51)

## 2019-04-14 LAB — CBC WITH DIFFERENTIAL/PLATELET
Abs Immature Granulocytes: 0.06 10*3/uL (ref 0.00–0.07)
Basophils Absolute: 0 10*3/uL (ref 0.0–0.1)
Basophils Relative: 0 %
Eosinophils Absolute: 0.2 10*3/uL (ref 0.0–0.5)
Eosinophils Relative: 2 %
HCT: 35.5 % — ABNORMAL LOW (ref 36.0–46.0)
Hemoglobin: 11.8 g/dL — ABNORMAL LOW (ref 12.0–15.0)
Immature Granulocytes: 1 %
Lymphocytes Relative: 7 %
Lymphs Abs: 0.6 10*3/uL — ABNORMAL LOW (ref 0.7–4.0)
MCH: 31.1 pg (ref 26.0–34.0)
MCHC: 33.2 g/dL (ref 30.0–36.0)
MCV: 93.7 fL (ref 80.0–100.0)
Monocytes Absolute: 0.5 10*3/uL (ref 0.1–1.0)
Monocytes Relative: 6 %
Neutro Abs: 7.6 10*3/uL (ref 1.7–7.7)
Neutrophils Relative %: 84 %
Platelets: 149 10*3/uL — ABNORMAL LOW (ref 150–400)
RBC: 3.79 MIL/uL — ABNORMAL LOW (ref 3.87–5.11)
RDW: 12.8 % (ref 11.5–15.5)
WBC: 8.9 10*3/uL (ref 4.0–10.5)
nRBC: 0 % (ref 0.0–0.2)

## 2019-04-14 LAB — POTASSIUM
Potassium: 2.6 mmol/L — CL (ref 3.5–5.1)
Potassium: 3.1 mmol/L — ABNORMAL LOW (ref 3.5–5.1)

## 2019-04-14 LAB — MAGNESIUM
Magnesium: 1.8 mg/dL (ref 1.7–2.4)
Magnesium: 1.9 mg/dL (ref 1.7–2.4)

## 2019-04-14 LAB — SARS CORONAVIRUS 2 BY RT PCR (HOSPITAL ORDER, PERFORMED IN ~~LOC~~ HOSPITAL LAB): SARS Coronavirus 2: NEGATIVE

## 2019-04-14 LAB — PHOSPHORUS: Phosphorus: 3 mg/dL (ref 2.5–4.6)

## 2019-04-14 LAB — MRSA PCR SCREENING: MRSA by PCR: NEGATIVE

## 2019-04-14 IMAGING — DX PORTABLE CHEST - 1 VIEW
1 series · 1 of 1 positions shown · non-contrast
Comparison: None.

CLINICAL DATA: Weakness.  Nausea and vomiting for 3 days.

EXAM:
PORTABLE CHEST 1 VIEW

[chest ap]
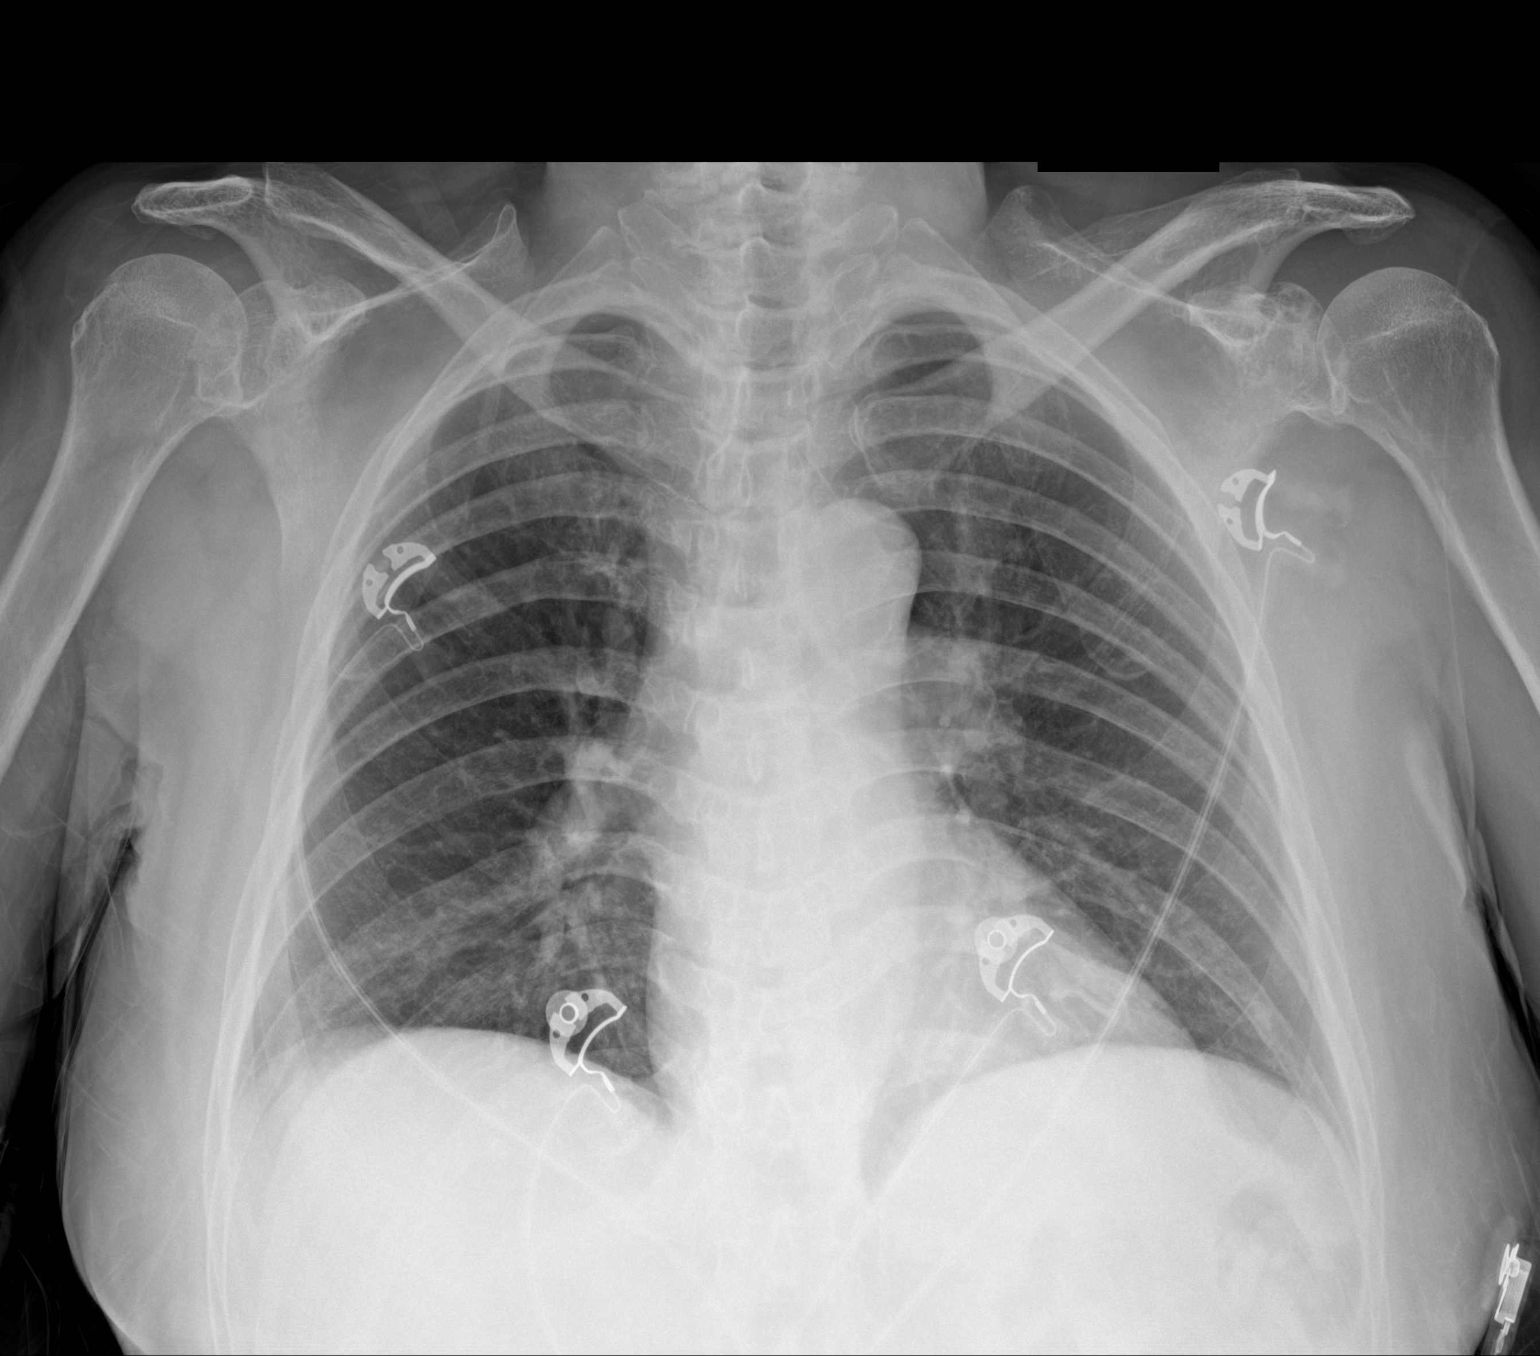

[1 of 1 positions shown; findings below may reference images not displayed]

FINDINGS: The cardiomediastinal silhouette is within normal limits. The lungs
are mildly hypoinflated with mild bronchovascular crowding in the
lung bases. No airspace consolidation, edema, pleural effusion,
pneumothorax is identified. No acute osseous abnormality is seen.
IMPRESSION: No active disease.

## 2019-04-14 IMAGING — DX ABDOMEN - 1 VIEW
1 series · 1 of 1 positions shown · non-contrast
Comparison: Chest radiograph same date.

CLINICAL DATA: Nausea and vomiting for 3 days.

EXAM:
ABDOMEN - 1 VIEW

[abdomen supine]
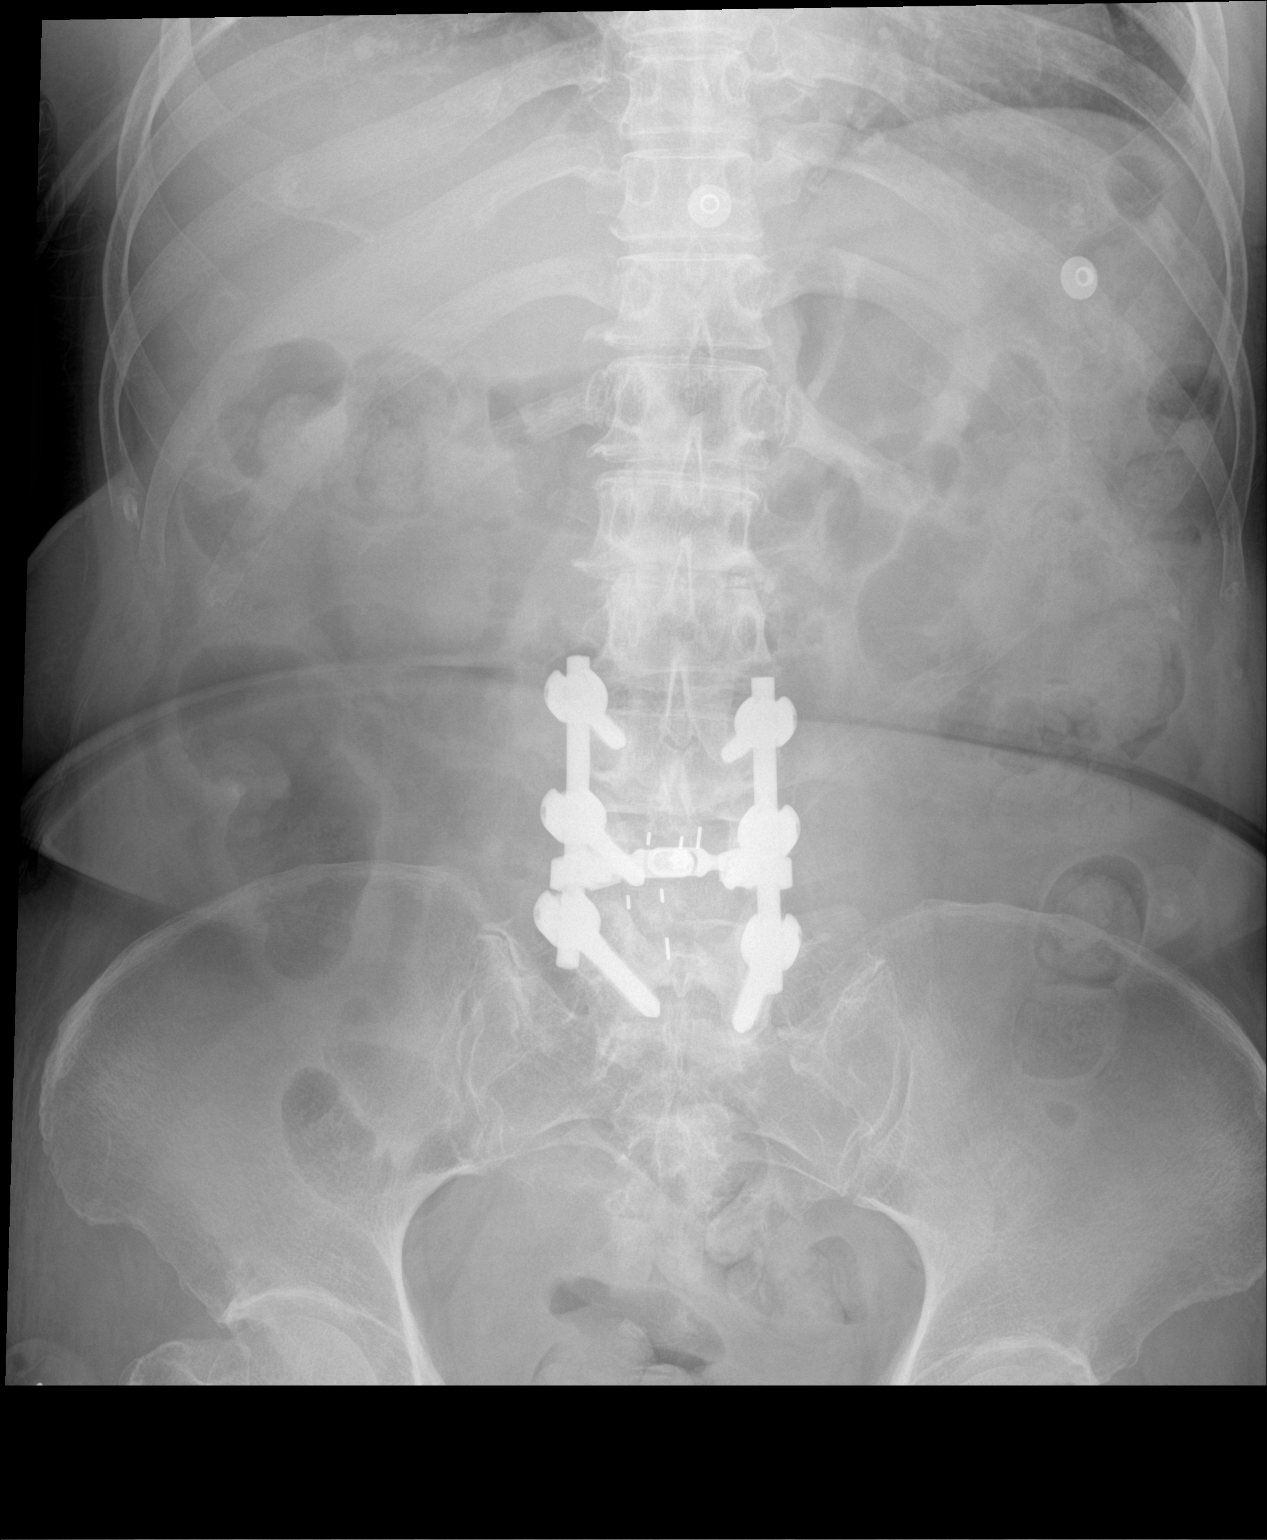

[1 of 1 positions shown; findings below may reference images not displayed]

FINDINGS: The bowel gas pattern is normal. There is mildly prominent stool in
the distal colon. There is no supine evidence of free
intraperitoneal air or suspicious abdominal calcification. There are
postsurgical changes in the lumbar spine status post L3 through 5
fusion. No acute osseous findings.
IMPRESSION: No acute abdominal findings. Mildly increased distal colonic stool
burden, suggesting constipation.

## 2019-04-14 MED ORDER — ENOXAPARIN SODIUM 30 MG/0.3ML ~~LOC~~ SOLN
30.0000 mg | SUBCUTANEOUS | Status: DC
  Administered 2019-04-14: 30 mg via SUBCUTANEOUS
  Filled 2019-04-14: qty 0.3

## 2019-04-14 MED ORDER — ACETAMINOPHEN 650 MG RE SUPP: 650.0000 mg | Freq: Four times a day (QID) | RECTAL | Status: DC | PRN

## 2019-04-14 MED ORDER — POTASSIUM CHLORIDE CRYS ER 20 MEQ PO TBCR: 40.0000 meq | EXTENDED_RELEASE_TABLET | Freq: Once | ORAL | Status: DC

## 2019-04-14 MED ORDER — MAGNESIUM SULFATE 2 GM/50ML IV SOLN
2.0000 g | Freq: Once | INTRAVENOUS | Status: AC
  Administered 2019-04-14: 2 g via INTRAVENOUS
  Filled 2019-04-14: qty 50

## 2019-04-14 MED ORDER — FELODIPINE ER 10 MG PO TB24
10.0000 mg | ORAL_TABLET | Freq: Every day | ORAL | Status: DC
  Administered 2019-04-14 – 2019-04-15 (×2): 10 mg via ORAL
  Filled 2019-04-14 (×4): qty 1

## 2019-04-14 MED ORDER — MELOXICAM 7.5 MG PO TABS
15.0000 mg | ORAL_TABLET | Freq: Every day | ORAL | Status: DC
  Administered 2019-04-14 – 2019-04-15 (×2): 15 mg via ORAL
  Filled 2019-04-14 (×3): qty 2

## 2019-04-14 MED ORDER — ONDANSETRON HCL 4 MG/2ML IJ SOLN
4.0000 mg | Freq: Once | INTRAMUSCULAR | Status: AC
  Administered 2019-04-14: 4 mg via INTRAVENOUS
  Filled 2019-04-14: qty 2

## 2019-04-14 MED ORDER — ASPIRIN 81 MG PO CHEW
81.0000 mg | CHEWABLE_TABLET | Freq: Every day | ORAL | Status: DC
  Administered 2019-04-14 – 2019-04-30 (×14): 81 mg via ORAL
  Filled 2019-04-14 (×15): qty 1

## 2019-04-14 MED ORDER — PNEUMOCOCCAL VAC POLYVALENT 25 MCG/0.5ML IJ INJ
0.5000 mL | INJECTION | INTRAMUSCULAR | Status: AC
  Administered 2019-04-15: 0.5 mL via INTRAMUSCULAR
  Filled 2019-04-14: qty 0.5

## 2019-04-14 MED ORDER — SENNOSIDES-DOCUSATE SODIUM 8.6-50 MG PO TABS: 1.0000 | ORAL_TABLET | Freq: Every evening | ORAL | Status: DC | PRN

## 2019-04-14 MED ORDER — ACETAMINOPHEN 325 MG PO TABS
650.0000 mg | ORAL_TABLET | Freq: Four times a day (QID) | ORAL | Status: DC | PRN
  Administered 2019-04-14 – 2019-04-18 (×6): 650 mg via ORAL
  Filled 2019-04-14 (×6): qty 2

## 2019-04-14 MED ORDER — POTASSIUM CHLORIDE IN NACL 40-0.9 MEQ/L-% IV SOLN
INTRAVENOUS | Status: DC
  Administered 2019-04-14 – 2019-04-15 (×2): 100 mL/h via INTRAVENOUS
  Filled 2019-04-14 (×3): qty 1000

## 2019-04-14 MED ORDER — POTASSIUM CHLORIDE 10 MEQ/100ML IV SOLN
10.0000 meq | Freq: Once | INTRAVENOUS | Status: AC
  Administered 2019-04-14: 12:00:00 10 meq via INTRAVENOUS
  Filled 2019-04-14: qty 100

## 2019-04-14 MED ORDER — ONDANSETRON HCL 4 MG PO TABS: 4.0000 mg | ORAL_TABLET | Freq: Four times a day (QID) | ORAL | Status: DC | PRN

## 2019-04-14 MED ORDER — SIMVASTATIN 20 MG PO TABS: 20.0000 mg | ORAL_TABLET | Freq: Every day | ORAL | Status: DC

## 2019-04-14 MED ORDER — ATORVASTATIN CALCIUM 20 MG PO TABS
80.0000 mg | ORAL_TABLET | Freq: Every day | ORAL | Status: DC
  Administered 2019-04-14 – 2019-04-18 (×5): 80 mg via ORAL
  Filled 2019-04-14 (×5): qty 4

## 2019-04-14 MED ORDER — ONDANSETRON HCL 4 MG/2ML IJ SOLN
4.0000 mg | Freq: Four times a day (QID) | INTRAMUSCULAR | Status: DC | PRN
  Administered 2019-04-14 – 2019-04-23 (×4): 4 mg via INTRAVENOUS
  Filled 2019-04-14 (×4): qty 2

## 2019-04-14 MED ORDER — POTASSIUM CHLORIDE CRYS ER 20 MEQ PO TBCR
40.0000 meq | EXTENDED_RELEASE_TABLET | ORAL | Status: AC
  Administered 2019-04-14 (×2): 40 meq via ORAL
  Filled 2019-04-14 (×3): qty 2

## 2019-04-14 MED ORDER — LOSARTAN POTASSIUM 50 MG PO TABS
50.0000 mg | ORAL_TABLET | Freq: Every day | ORAL | Status: DC
  Administered 2019-04-14 – 2019-04-15 (×2): 50 mg via ORAL
  Filled 2019-04-14 (×2): qty 1

## 2019-04-14 MED ORDER — SODIUM CHLORIDE 0.9% FLUSH
3.0000 mL | Freq: Once | INTRAVENOUS | Status: AC
  Administered 2019-04-22: 10:00:00 3 mL via INTRAVENOUS

## 2019-04-14 MED ORDER — VENLAFAXINE HCL ER 37.5 MG PO CP24
37.5000 mg | ORAL_CAPSULE | Freq: Every day | ORAL | Status: DC
  Administered 2019-04-15 – 2019-04-30 (×14): 37.5 mg via ORAL
  Filled 2019-04-14 (×16): qty 1

## 2019-04-14 MED ORDER — POTASSIUM CHLORIDE 10 MEQ/100ML IV SOLN
10.0000 meq | INTRAVENOUS | Status: AC
  Administered 2019-04-14 (×5): 10 meq via INTRAVENOUS
  Filled 2019-04-14 (×5): qty 100

## 2019-04-14 MED ORDER — SODIUM CHLORIDE 0.9 % IV SOLN
1000.0000 mL | Freq: Once | INTRAVENOUS | Status: AC
  Administered 2019-04-14: 1000 mL via INTRAVENOUS

## 2019-04-14 NOTE — ED Notes (Signed)
ED TO INPATIENT HANDOFF REPORT  ED Nurse Name and Phone #: Vikki Portsvalerie [phone]  S Name/Age/Gender Sarah Sullivan 73 y.o. female Room/Bed: ED17A/ED17A  Code Status   Code Status: Not on file  Home/SNF/Other Home Patient oriented to: self, place, time and situation Is this baseline? Yes   Triage Complete: Triage complete  Chief Complaint nausea;vomiting  Triage Note Arrives via [facility].  Per EMS patient c/o N/V x 3 days.  Unable to tolerate PO. Also c/o weakness and recent falls.  Arrives from home.  #18 g LFA started by EMS.   Allergies No Known Allergies  Level of Care/Admitting Diagnosis ED Disposition    ED Disposition Condition Comment   Admit  Hospital [facility]: [facility] [[id number]]  Level of Care: Med-Surg [16]  Covid Evaluation: N/A  Diagnosis: Hypokalemia [[id number]][[id number]]  Admitting Physician: Ihor AustinPYREDDY, PAVAN [[id number]]  Attending Physician: Ihor AustinPYREDDY, PAVAN [[id number]]  PT Class (Do Not Modify): Observation [104]  PT Acc Code (Do Not Modify): Observation [10022]       B Medical/Surgery History Past Medical History:  Diagnosis Date  . Hypertension    Past Surgical History:  Procedure Laterality Date  . ABDOMINAL HYSTERECTOMY       A IV Location/Drains/Wounds Patient Lines/Drains/Airways Status   Active Line/Drains/Airways    Name:   Placement date:   Placement time:   Site:   Days:   Peripheral IV 04/14/19 Left Forearm   04/14/19    1031    Forearm   less than 1          Intake/Output Last 24 hours No intake or output data in the 24 hours ending 04/14/19 1411  Labs/Imaging Results for orders placed or performed during the hospital encounter of 04/14/19 (from the past 48 hour(s))  CBC with Differential     Status: Abnormal   Collection Time: 04/14/19 10:32 AM  Result Value Ref Range   WBC 8.9 4.0 - 10.5 K/uL   RBC 3.79 (L) 3.87 - 5.11 MIL/uL   Hemoglobin 11.8 (L) 12.0 - 15.0 g/dL   HCT 56.235.5 (L) 13.036.0 - 86.546.0 %   MCV 93.7 80.0 - 100.0 fL   MCH  31.1 26.0 - 34.0 pg   MCHC 33.2 30.0 - 36.0 g/dL   RDW 78.412.8 69.611.5 - 29.515.5 %   Platelets 149 (L) 150 - 400 K/uL   nRBC 0.0 0.0 - 0.2 %   Neutrophils Relative % 84 %   Neutro Abs 7.6 1.7 - 7.7 K/uL   Lymphocytes Relative 7 %   Lymphs Abs 0.6 (L) 0.7 - 4.0 K/uL   Monocytes Relative 6 %   Monocytes Absolute 0.5 0.1 - 1.0 K/uL   Eosinophils Relative 2 %   Eosinophils Absolute 0.2 0.0 - 0.5 K/uL   Basophils Relative 0 %   Basophils Absolute 0.0 0.0 - 0.1 K/uL   Immature Granulocytes 1 %   Abs Immature Granulocytes 0.06 0.00 - 0.07 K/uL    Comment: Performed at [facility], [address] [place], [place] [address]  Comprehensive metabolic panel     Status: Abnormal   Collection Time: 04/14/19 10:32 AM  Result Value Ref Range   Sodium 135 135 - 145 mmol/L   Potassium 2.2 (LL) 3.5 - 5.1 mmol/L    Comment: CRITICAL RESULT CALLED TO, READ BACK BY AND VERIFIED WITH Mechelle Pates AT 1100 ON 04/14/19 KLM   Chloride 97 (L) 98 - 111 mmol/L   CO2 27 22 - 32 mmol/L   Glucose, Bld  113 (H) 70 - 99 mg/dL   BUN 27 (H) 8 - 23 mg/dL   Creatinine, Ser 1.61 (H) 0.44 - 1.00 mg/dL   Calcium 9.1 8.9 - 09.6 mg/dL   Total Protein 7.4 6.5 - 8.1 g/dL   Albumin 3.5 3.5 - 5.0 g/dL   AST 045 (H) 15 - 41 U/L   ALT 78 (H) 0 - 44 U/L   Alkaline Phosphatase 130 (H) 38 - 126 U/L   Total Bilirubin 1.1 0.3 - 1.2 mg/dL   GFR calc non Af Amer 41 (L) >60 mL/min   GFR calc Af Amer 47 (L) >60 mL/min   Anion gap 11 5 - 15    Comment: Performed at [facility], [address] [place], [place] [address]  Lipase, blood     Status: None   Collection Time: 04/14/19 10:32 AM  Result Value Ref Range   Lipase 26 11 - 51 U/L    Comment: Performed at [facility], [address] [place], [place] [address]  Troponin I - ONCE - STAT     Status: Abnormal   Collection Time: 04/14/19 10:32 AM  Result Value Ref Range   Troponin I 0.04 (HH) <0.03 ng/mL    Comment: CRITICAL RESULT CALLED TO, READ  BACK BY AND VERIFIED WITH Daishia Fetterly AT 1100 ON 04/14/19 KLM Performed at [facility], [address] [place], [place] [address]   SARS Coronavirus 2 (CEPHEID - Performed in [facility] hospital lab), Hosp Order     Status: None   Collection Time: 04/14/19 10:32 AM  Result Value Ref Range   SARS Coronavirus 2 NEGATIVE NEGATIVE    Comment: (NOTE) If result is NEGATIVE SARS-CoV-2 target nucleic acids are NOT DETECTED. The SARS-CoV-2 RNA is generally detectable in upper and lower  respiratory specimens during the acute phase of infection. The lowest  concentration of SARS-CoV-2 viral copies this assay can detect is 250  copies / mL. A negative result does not preclude SARS-CoV-2 infection  and should not be used as the sole basis for treatment or other  patient management decisions.  A negative result may occur with  improper specimen collection / handling, submission of specimen other  than nasopharyngeal swab, presence of viral mutation(s) within the  areas targeted by this assay, and inadequate number of viral copies  (<250 copies / mL). A negative result must be combined with clinical  observations, patient history, and epidemiological information. If result is POSITIVE SARS-CoV-2 target nucleic acids are DETECTED. The SARS-CoV-2 RNA is generally detectable in upper and lower  respiratory specimens dur ing the acute phase of infection.  Positive  results are indicative of active infection with SARS-CoV-2.  Clinical  correlation with patient history and other diagnostic information is  necessary to determine patient infection status.  Positive results do  not rule out bacterial infection or co-infection with other viruses. If result is PRESUMPTIVE POSTIVE SARS-CoV-2 nucleic acids MAY BE PRESENT.   A presumptive positive result was obtained on the submitted specimen  and confirmed on repeat testing.  While 2019 novel coronavirus  (SARS-CoV-2) nucleic acids may be  present in the submitted sample  additional confirmatory testing may be necessary for epidemiological  and / or clinical management purposes  to differentiate between  SARS-CoV-2 and other Sarbecovirus currently known to infect humans.  If clinically indicated additional testing with an alternate test  methodology [phone]) is advised. The SARS-CoV-2 RNA is generally  detectable in upper and lower respiratory  sp ecimens during the acute  phase of infection. The expected result is Negative. Fact Sheet for Patients:  BoilerBrush.com.cy Fact Sheet for Healthcare Providers: https://pope.com/ This test is not yet approved or cleared by the [place] FDA and has been authorized for detection and/or diagnosis of SARS-CoV-2 by FDA under an Emergency Use Authorization (EUA).  This EUA will remain in effect (meaning this test can be used) for the duration of the COVID-19 declaration under Section 564(b)(1) of the Act, 21 U.S.C. section 360bbb-3(b)(1), unless the authorization is terminated or revoked sooner. Performed at [facility], [address] [place], [place] [address]   Magnesium     Status: None   Collection Time: 04/14/19 10:32 AM  Result Value Ref Range   Magnesium 1.8 1.7 - 2.4 mg/dL    Comment: Performed at [facility], [address] [place], [place] [address]   Dg Chest Port 1 View  Result Date: 04/14/2019 CLINICAL DATA:  Weakness.  Nausea and vomiting for 3 days. EXAM: PORTABLE CHEST 1 VIEW COMPARISON:  None. FINDINGS: The cardiomediastinal silhouette is within normal limits. The lungs are mildly hypoinflated with mild bronchovascular crowding in the lung bases. No airspace consolidation, edema, pleural effusion, pneumothorax is identified. No acute osseous abnormality is seen. IMPRESSION: No active disease. Electronically Signed   By: Sebastian Ache M.D.   On: 04/14/2019 11:15    Pending Labs Unresulted  Labs (From admission, onward)    Start     Ordered   04/14/19 2000  Potassium  Once-Timed,   STAT     04/14/19 1334   04/14/19 1028  Urinalysis, Complete w Microscopic  ONCE - STAT,   STAT     04/14/19 1027   Signed and Held  CBC  (enoxaparin (LOVENOX)    CrCl >/= 30 ml/min)  Once,   R    Comments:  Baseline for enoxaparin therapy IF NOT ALREADY DRAWN.  Notify MD if PLT < 100 K.    Signed and Held   Signed and Held  Creatinine, serum  (enoxaparin (LOVENOX)    CrCl >/= 30 ml/min)  Once,   R    Comments:  Baseline for enoxaparin therapy IF NOT ALREADY DRAWN.    Signed and Held   Signed and Held  Creatinine, serum  (enoxaparin (LOVENOX)    CrCl >/= 30 ml/min)  Weekly,   R    Comments:  while on enoxaparin therapy    Signed and Held   Signed and Held  Basic metabolic panel  Tomorrow morning,   R     Signed and Held   Signed and Held  CBC  Tomorrow morning,   R     Signed and Held          Vitals/Pain Today's Vitals   04/14/19 1200 04/14/19 1230 04/14/19 1300 04/14/19 1330  BP: (!) 100/46 (!) 109/51 (!) 105/48 (!) 104/46  Pulse: 63 67 68 66  Resp: (!) 23 (!) 23 (!) 26 (!) 22  Temp:      TempSrc:      SpO2: 100% 100% 99% 99%  Weight:      Height:      PainSc:        Isolation Precautions No active isolations  Medications Medications  sodium chloride flush (NS) 0.9 % injection 3 mL (3 mLs Intravenous Not Given 04/14/19 1106)  magnesium sulfate IVPB 2 g 50 mL (2 g Intravenous New Bag/Given 04/14/19 1348)  potassium chloride 10 mEq in 100  mL IVPB (10 mEq Intravenous New Bag/Given 04/14/19 1349)  0.9 %  sodium chloride infusion (0 mLs Intravenous Stopped 04/14/19 1210)  ondansetron (ZOFRAN) injection 4 mg (4 mg Intravenous Given 04/14/19 1036)  potassium chloride 10 mEq in 100 mL IVPB (0 mEq Intravenous Stopped 04/14/19 1342)    Mobility walks Low fall risk   Focused Assessments Cardiac Assessment Handoff:  Cardiac Rhythm: Normal sinus rhythm Lab Results  Component  Value Date   TROPONINI 0.04 (HH) 04/14/2019   No results found for: DDIMER Does the Patient currently have chest pain? No     R Recommendations: See Admitting Provider Note  Report given to:   Additional Notes:  Huston Foley into 17s, MD is aware. Remains able to answer all questions. BP WNL. K and Mg infusing.

## 2019-04-14 NOTE — ED Notes (Signed)
Called and updated patients husband with her verbal permission.

## 2019-04-14 NOTE — Progress Notes (Signed)
Advanced care plan. Purpose of the Encounter: CODE STATUS Parties in Attendance: Patient Patient's Decision Capacity: Good Subjective/Patient's story:  Sarah Sullivan  is a 73 y.o. female with a known history of hypertension presented to the emergency room with nausea and vomiting.  Patient appears dry and dehydrated.  Also complains of dizziness.  No recent travel.  No fever, cough.  She was evaluated in the emergency room found to be dehydrated and potassium level is low. Objective/Medical story Patient is dry and dehydrated.  Needs IV fluids and potassium replacement. Goals of care determination:  Advance care directives goals of care and treatment plan discussed. Patient wants everything done which includes CPR, intubation ventilator if the need arises. CODE STATUS: Full code Time spent discussing advanced care planning: 16 minutes

## 2019-04-14 NOTE — H&P (Signed)
[facility] -  at [facility]   PATIENT NAME: Sarah Sullivan    MR#:  [id number]  DATE OF BIRTH:  1946/11/20  DATE OF ADMISSION:  04/14/2019  PRIMARY CARE PHYSICIAN: Titus MouldWhite, Elizabeth Burney, NP   REQUESTING/REFERRING PHYSICIAN:   CHIEF COMPLAINT:   Chief Complaint  Patient presents with  . Nausea  . Emesis    HISTORY OF PRESENT ILLNESS: Sarah Sullivan  is a 73 y.o. female with a known history of hypertension presented to the emergency room with nausea and vomiting.  Patient appears dry and dehydrated.  Also complains of dizziness.  No recent travel.  No fever, cough.  She was evaluated in the emergency room found to be dehydrated and potassium level is low.  PAST MEDICAL HISTORY:   Past Medical History:  Diagnosis Date  . Hypertension     PAST SURGICAL HISTORY:  Past Surgical History:  Procedure Laterality Date  . ABDOMINAL HYSTERECTOMY      SOCIAL HISTORY:  Social History   Tobacco Use  . Smoking status: Never Smoker  . Smokeless tobacco: Never Used  Substance Use Topics  . Alcohol use: Not Currently    FAMILY HISTORY: Mother and father deceased No history of COPD diabetes cancer in the family  DRUG ALLERGIES: No Known Allergies  REVIEW OF SYSTEMS:   CONSTITUTIONAL: No fever, has fatigue and weakness.  EYES: No blurred or double vision.  EARS, NOSE, AND THROAT: No tinnitus or ear pain.  RESPIRATORY: No cough, shortness of breath, wheezing or hemoptysis.  CARDIOVASCULAR: No chest pain, orthopnea, edema.  GASTROINTESTINAL: No nausea, vomiting, diarrhea or abdominal pain.  GENITOURINARY: No dysuria, hematuria.  ENDOCRINE: No polyuria, nocturia,  HEMATOLOGY: No anemia, easy bruising or bleeding SKIN: No rash or lesion. MUSCULOSKELETAL: No joint pain or arthritis.   NEUROLOGIC: No tingling, numbness, weakness. Has dizziness  PSYCHIATRY: No anxiety or depression.   MEDICATIONS AT HOME:  Prior to Admission medications    Medication Sig Start Date End Date Taking? Authorizing Provider  aspirin 81 MG chewable tablet Chew 81 mg by mouth daily.   Yes [provider]  atorvastatin (LIPITOR) 80 MG tablet Take 80 mg by mouth daily.   Yes [provider]  chlorthalidone (HYGROTON) 25 MG tablet Take 25 mg by mouth daily.   Yes [provider]  desvenlafaxine (PRISTIQ) 50 MG 24 hr tablet Take 50 mg by mouth daily.   Yes [provider]  felodipine (PLENDIL) 10 MG 24 hr tablet Take 10 mg by mouth daily.   Yes [provider]  losartan (COZAAR) 50 MG tablet Take 50 mg by mouth daily.   Yes [provider]  meloxicam (MOBIC) 15 MG tablet Take 15 mg by mouth daily as needed for pain.    Yes [provider]  simvastatin (ZOCOR) 20 MG tablet Take 20 mg by mouth daily.    [provider]      PHYSICAL EXAMINATION:   VITAL SIGNS: Blood pressure (!) 109/51, pulse 67, temperature 99.4 F (37.4 C), temperature source Oral, resp. rate (!) 23, height 4\' 8"  (1.422 m), weight 45.4 kg, SpO2 100 %.  GENERAL:  73 y.o.-year-old patient lying in the bed with no acute distress.  EYES: Pupils equal, round, reactive to light and accommodation. No scleral icterus. Extraocular muscles intact.  HEENT: Head atraumatic, normocephalic. Oropharynx dry and nasopharynx clear.  NECK:  Supple, no jugular venous distention. No thyroid enlargement, no tenderness.  LUNGS: Normal breath sounds bilaterally, no wheezing, rales,rhonchi  or crepitation. No use of accessory muscles of respiration.  CARDIOVASCULAR: S1, S2 normal. No murmurs, rubs, or gallops.  ABDOMEN: Soft, nontender, nondistended. Bowel sounds present. No organomegaly or mass.  EXTREMITIES: No pedal edema, cyanosis, or clubbing.  NEUROLOGIC: Cranial nerves II through XII are intact. Muscle strength 5/5 in all extremities. Sensation intact. Gait not checked.  PSYCHIATRIC: The patient is alert and oriented x 3.  SKIN: No  obvious rash, lesion, or ulcer.   LABORATORY PANEL:   CBC Recent Labs  Lab 04/14/19 1032  WBC 8.9  HGB 11.8*  HCT 35.5*  PLT 149*  MCV 93.7  MCH 31.1  MCHC 33.2  RDW 12.8  LYMPHSABS 0.6*  MONOABS 0.5  EOSABS 0.2  BASOSABS 0.0   ------------------------------------------------------------------------------------------------------------------  Chemistries  Recent Labs  Lab 04/14/19 1032  NA 135  K 2.2*  CL 97*  CO2 27  GLUCOSE 113*  BUN 27*  CREATININE 1.30*  CALCIUM 9.1  MG 1.8  AST 102*  ALT 78*  ALKPHOS 130*  BILITOT 1.1   ------------------------------------------------------------------------------------------------------------------ estimated creatinine clearance is 24.3 mL/min (A) (by C-G formula based on SCr of 1.3 mg/dL (H)). ------------------------------------------------------------------------------------------------------------------ No results for input(s): TSH, T4TOTAL, T3FREE, THYROIDAB in the last 72 hours.  Invalid input(s): FREET3   Coagulation profile No results for input(s): INR, PROTIME in the last 168 hours. ------------------------------------------------------------------------------------------------------------------- No results for input(s): DDIMER in the last 72 hours. -------------------------------------------------------------------------------------------------------------------  Cardiac Enzymes Recent Labs  Lab 04/14/19 1032  TROPONINI 0.04*   ------------------------------------------------------------------------------------------------------------------ Invalid input(s): POCBNP  ---------------------------------------------------------------------------------------------------------------  Urinalysis No results found for: COLORURINE, APPEARANCEUR, LABSPEC, PHURINE, GLUCOSEU, HGBUR, BILIRUBINUR, KETONESUR, PROTEINUR, UROBILINOGEN, NITRITE, LEUKOCYTESUR   RADIOLOGY: Dg Chest Port 1 View  Result Date:  04/14/2019 CLINICAL DATA:  Weakness.  Nausea and vomiting for 3 days. EXAM: PORTABLE CHEST 1 VIEW COMPARISON:  None. FINDINGS: The cardiomediastinal silhouette is within normal limits. The lungs are mildly hypoinflated with mild bronchovascular crowding in the lung bases. No airspace consolidation, edema, pleural effusion, pneumothorax is identified. No acute osseous abnormality is seen. IMPRESSION: No active disease. Electronically Signed   By: Sebastian Ache M.D.   On: 04/14/2019 11:15    EKG: Orders placed or performed during the hospital encounter of 04/14/19  . ED EKG  . ED EKG  . EKG 12-Lead  . EKG 12-Lead    IMPRESSION AND PLAN:  73 year old female patient with history of hypertension presented to the emergency room for nausea and vomiting and weakness  -Acute dehydration Aggressive IV fluids  -Acute hypokalemia Replace potassium intravenously  -Dizziness Could be from dehydration Check for orthostatic hypotension  -Nausea and vomiting Abdominal x-ray to check for any ileus Antiemetics  -DVT prophylaxis subcu Lovenox daily  All the records are reviewed and case discussed with ED provider. Management plans discussed with the patient, family and they are in agreement.  CODE STATUS:Full code  TOTAL TIME TAKING CARE OF THIS PATIENT: 52 minutes.    Ihor Austin M.D on 04/14/2019 at 1:31 PM  Between 7am to 6pm - Pager - [phone]  After 6pm go to [email or web address] - password [email or web address] [facility]  [facility]  Office  [phone]  CC: Primary care physician; Titus Mould, NP

## 2019-04-14 NOTE — Progress Notes (Signed)
PHARMACIST - PHYSICIAN COMMUNICATION  CONCERNING:  Enoxaparin (Lovenox) for DVT Prophylaxis    RECOMMENDATION: Patient was prescribed enoxaprin 40mg  q24 hours for VTE prophylaxis.   Filed Weights   04/14/19 1026  Weight: 100 lb (45.4 kg)    Body mass index is 22.42 kg/m.  Estimated Creatinine Clearance: 24.3 mL/min (A) (by C-G formula based on SCr of 1.3 mg/dL (H)).   Patient is candidate for enoxaparin 30mg  every 24 hours based on CrCl <98ml/min or Weight less then 45kg for female and 50kg for female  DESCRIPTION: Pharmacy has adjusted enoxaparin dose per [facility] policy.  Patient is now receiving enoxaparin 30mg  every 24 hours.  Dior Stepter L, RPh 04/14/2019 4:12 PM

## 2019-04-14 NOTE — ED Notes (Signed)
Dr Cyril Loosen notified of k 2.2 and troponin 0.04

## 2019-04-14 NOTE — Consult Note (Signed)
PHARMACY CONSULT NOTE - FOLLOW UP  Pharmacy Consult for Electrolyte Monitoring and Replacement   Recent Labs: Potassium (mmol/L)  Date Value  04/14/2019 2.2 (LL)  12/25/2014 3.5   Magnesium (mg/dL)  Date Value  09/32/6712 1.8   Calcium (mg/dL)  Date Value  45/80/9983 9.1   Calcium, Total (mg/dL)  Date Value  38/25/0539 9.2   Albumin (g/dL)  Date Value  76/73/4193 3.5   Sodium (mmol/L)  Date Value  04/14/2019 135  12/25/2014 141    Assessment: Pharmacy consulted for electrolyte monitoring and replacement in 73 yo female admitted with N/V x 3 days and hypokalemia. Potassium 2.2 on admission.   Goal of Therapy:  Electrolytes WNL K: 3.5-5 Mg: >1.8   Plan:  Patient has received potassium 10 mEq IV x 1 dose. Will order Magnesium 2g IV x 1 dose and KCL IV x 5 doses. Will recheck K+ level this evening after last IV dose.   Pharmacy will continue to monitor labs and order replacement as needed.   Gardner Candle, PharmD, BCPS Clinical Pharmacist 04/14/2019 1:33 PM

## 2019-04-14 NOTE — ED Notes (Signed)
Attempted to call husband and update about room number. No answer and no voicemail set up

## 2019-04-14 NOTE — Consult Note (Addendum)
PHARMACY CONSULT NOTE - FOLLOW UP  Pharmacy Consult for Electrolyte Monitoring and Replacement   Recent Labs: Potassium (mmol/L)  Date Value  04/14/2019 3.1 (L)  12/25/2014 3.5   Magnesium (mg/dL)  Date Value  00/17/4944 1.8  04/14/2019 1.9   Calcium (mg/dL)  Date Value  96/75/9163 9.1   Calcium, Total (mg/dL)  Date Value  84/66/5993 9.2   Albumin (g/dL)  Date Value  57/11/7791 3.5   Phosphorus (mg/dL)  Date Value  90/30/0923 3.0   Sodium (mmol/L)  Date Value  04/14/2019 135  12/25/2014 141    Assessment: Pharmacy consulted for electrolyte monitoring and replacement in 73 yo female admitted with N/V x 3 days and hypokalemia. Potassium 2.2 on admission.   Goal of Therapy:  Electrolytes WNL K: 3.5-5 Mg: >1.8   Plan:  Patient has received potassium 10 mEq IV x 6, magnesium 2 g IV x 1. Will order KCl PO 40 mEq x 2. Pt also has a NS w/ KCl 40 mEq bag running.  Will recheck K+ level with AM labs.   Pharmacy will continue to monitor labs and order replacement as needed.   Ronnald Ramp, PharmD, BCPS Clinical Pharmacist 04/14/2019 8:18 PM

## 2019-04-14 NOTE — Progress Notes (Signed)
MD notified: Critical lab value potassium 2.6

## 2019-04-14 NOTE — Care Management Obs Status (Signed)
MEDICARE OBSERVATION STATUS NOTIFICATION   Patient Details  Name: Sarah Sullivan MRN: [id number] Date of Birth: Sep 22, 1946   Medicare Observation Status Notification Given:  Yes    Eber Hong, RN 04/14/2019, 2:18 PM

## 2019-04-14 NOTE — ED Triage Notes (Signed)
Arrives via [facility].  Per EMS patient c/o N/V x 3 days.  Unable to tolerate PO. Also c/o weakness and recent falls.  Arrives from home.  #18 g LFA started by EMS.

## 2019-04-14 NOTE — ED Provider Notes (Signed)
[facility] Emergency Department Provider Note   ____________________________________________    I have reviewed the triage vital signs and the nursing notes.   HISTORY  Chief Complaint Nausea and Emesis     HPI Sarah Sullivan is a 73 y.o. female who presents with complaints of nausea and vomiting for approximately 3 days.  She reports whenever she eats something it seems to come right back up again.  She denies abdominal pain.  Denies fevers or chills.  No sick contacts.  No recent travel.  Denies diarrhea.  Has not taken anything for this.  Feels weak and dizzy and dehydrated   Past Medical History:  Diagnosis Date  . Hypertension     There are no active problems to display for this patient.   Past Surgical History:  Procedure Laterality Date  . ABDOMINAL HYSTERECTOMY      Prior to Admission medications   Medication Sig Start Date End Date Taking? Authorizing Provider  aspirin 81 MG chewable tablet Chew 81 mg by mouth daily.   Yes [provider]  atorvastatin (LIPITOR) 80 MG tablet Take 80 mg by mouth daily.   Yes [provider]  chlorthalidone (HYGROTON) 25 MG tablet Take 25 mg by mouth daily.   Yes [provider]  desvenlafaxine (PRISTIQ) 50 MG 24 hr tablet Take 50 mg by mouth daily.   Yes [provider]  felodipine (PLENDIL) 10 MG 24 hr tablet Take 10 mg by mouth daily.   Yes [provider]  losartan (COZAAR) 50 MG tablet Take 50 mg by mouth daily.   Yes [provider]  meloxicam (MOBIC) 15 MG tablet Take 15 mg by mouth daily as needed for pain.    Yes [provider]  simvastatin (ZOCOR) 20 MG tablet Take 20 mg by mouth daily.    [provider]     Allergies Patient has no known allergies.  No family history on file.  Social History Social History   Tobacco Use  . Smoking status: Never Smoker  . Smokeless tobacco: Never Used  Substance Use Topics   . Alcohol use: Not Currently  . Drug use: Not Currently    Review of Systems  Constitutional: No fever/chills Eyes: No visual changes.  ENT: No sore throat. Cardiovascular: Denies chest pain. Respiratory: Denies shortness of breath. Gastrointestinal: As above Genitourinary: Negative for dysuria. Musculoskeletal: No myalgias Skin: Negative for rash. Neurological: Negative for headaches     ____________________________________________   PHYSICAL EXAM:  VITAL SIGNS: ED Triage Vitals  Enc Vitals Group     BP 04/14/19 1028 105/63     Pulse Rate 04/14/19 1028 85     Resp 04/14/19 1028 17     Temp 04/14/19 1029 99.4 F (37.4 C)     Temp Source 04/14/19 1029 Oral     SpO2 04/14/19 1028 100 %     Weight 04/14/19 1026 45.4 kg (100 lb)     Height 04/14/19 1026 1.422 m (4\' 8" )     Head Circumference --      Peak Flow --      Pain Score 04/14/19 1026 0     Pain Loc --      Pain Edu? --      Excl. in GC? --     Constitutional: Alert and oriented.  Eyes: Conjunctivae are normal.   Nose: No congestion/rhinnorhea. Mouth/Throat: Mucous membranes are dry Neck:  Painless ROM Cardiovascular: Normal rate, regular rhythm. Grossly normal heart  sounds.  Good peripheral circulation. Respiratory: Normal respiratory effort.  No retractions. Lungs CTAB. Gastrointestinal: Soft and nontender. No distention.  No CVA tenderness.  Musculoskeletal: Warm and well perfused Neurologic:  Normal speech and language. No gross focal neurologic deficits are appreciated.  Skin:  Skin is warm, dry and intact. No rash noted. Psychiatric: Mood and affect are normal. Speech and behavior are normal.  ____________________________________________   LABS (all labs ordered are listed, but only abnormal results are displayed)  Labs Reviewed  CBC WITH DIFFERENTIAL/PLATELET - Abnormal; Notable for the following components:      Result Value   RBC 3.79 (*)    Hemoglobin 11.8 (*)    HCT 35.5 (*)     Platelets 149 (*)    Lymphs Abs 0.6 (*)    All other components within normal limits  COMPREHENSIVE METABOLIC PANEL - Abnormal; Notable for the following components:   Potassium 2.2 (*)    Chloride 97 (*)    Glucose, Bld 113 (*)    BUN 27 (*)    Creatinine, Ser 1.30 (*)    AST 102 (*)    ALT 78 (*)    Alkaline Phosphatase 130 (*)    GFR calc non Af Amer 41 (*)    GFR calc Af Amer 47 (*)    All other components within normal limits  TROPONIN I - Abnormal; Notable for the following components:   Troponin I 0.04 (*)    All other components within normal limits  SARS CORONAVIRUS 2 (HOSPITAL ORDER, PERFORMED IN [place] [facility])  LIPASE, BLOOD  URINALYSIS, COMPLETE (UACMP) WITH MICROSCOPIC   ____________________________________________  EKG  ED ECG REPORT I, Jene Everyobert Treydon Henricks, the attending physician, personally viewed and interpreted this ECG.  Date: 04/14/2019  Rhythm: normal sinus rhythm QRS Axis: normal Intervals: normal ST/T Wave abnormalities: normal Narrative Interpretation: PVCs  ____________________________________________  RADIOLOGY  Chest x-ray unremarkable ____________________________________________   PROCEDURES  Procedure(s) performed: No  Procedures   Critical Care performed: No ____________________________________________   INITIAL IMPRESSION / ASSESSMENT AND PLAN / ED COURSE  Pertinent labs & imaging results that were available during my care of the patient were reviewed by me and considered in my medical decision making (see chart for details).  Patient presents with nausea vomiting without abdominal pain for several days.  Clinically appears dehydrated, no abdominal tenderness to palpation.  Will give IV fluids check labs including electrolytes including chest x-ray COVID, giving description of weakness  Electrolytes significant for potassium of 2.2, we will replete with IV potassium.  Chest x-ray unremarkable.  BUN/creatinine  consistent with dehydration  COVID test is negative.  Patient will require admission for significant dehydration and hypokalemia   ____________________________________________   FINAL CLINICAL IMPRESSION(S) / ED DIAGNOSES  Final diagnoses:  Dehydration  Hypokalemia  Acute gastritis without hemorrhage, unspecified gastritis type        Note:  This document was prepared using Dragon voice recognition software and may include unintentional dictation errors.   Jene EveryKinner, Pluma Diniz, MD 04/14/19 [phone]

## 2019-04-14 NOTE — ED Notes (Signed)
Dr Cyril Loosen notified pt intermittently brady into upper 30/low 40

## 2019-04-14 NOTE — ED Notes (Signed)
Pt oriented. NAD at this time.

## 2019-04-15 ENCOUNTER — Observation Stay: Payer: Medicare Other

## 2019-04-15 ENCOUNTER — Other Ambulatory Visit: Payer: Self-pay

## 2019-04-15 LAB — BASIC METABOLIC PANEL
Anion gap: 6 (ref 5–15)
Anion gap: 9 (ref 5–15)
Anion gap: 8 (ref 5–15)
BUN: 33 mg/dL — ABNORMAL HIGH (ref 8–23)
BUN: 35 mg/dL — ABNORMAL HIGH (ref 8–23)
BUN: 32 mg/dL — ABNORMAL HIGH (ref 8–23)
CO2: 20 mmol/L — ABNORMAL LOW (ref 22–32)
CO2: 20 mmol/L — ABNORMAL LOW (ref 22–32)
CO2: 18 mmol/L — ABNORMAL LOW (ref 22–32)
Calcium: 8 mg/dL — ABNORMAL LOW (ref 8.9–10.3)
Calcium: 8.1 mg/dL — ABNORMAL LOW (ref 8.9–10.3)
Calcium: 7.3 mg/dL — ABNORMAL LOW (ref 8.9–10.3)
Chloride: 111 mmol/L (ref 98–111)
Chloride: 107 mmol/L (ref 98–111)
Chloride: 111 mmol/L (ref 98–111)
Creatinine, Ser: 2.22 mg/dL — ABNORMAL HIGH (ref 0.44–1.00)
Creatinine, Ser: 2.38 mg/dL — ABNORMAL HIGH (ref 0.44–1.00)
Creatinine, Ser: 2.28 mg/dL — ABNORMAL HIGH (ref 0.44–1.00)
GFR calc Af Amer: 25 mL/min — ABNORMAL LOW (ref >60)
GFR calc Af Amer: 23 mL/min — ABNORMAL LOW (ref >60)
GFR calc Af Amer: 24 mL/min — ABNORMAL LOW (ref >60)
GFR calc non Af Amer: 21 mL/min — ABNORMAL LOW (ref >60)
GFR calc non Af Amer: 20 mL/min — ABNORMAL LOW (ref >60)
GFR calc non Af Amer: 21 mL/min — ABNORMAL LOW (ref >60)
Glucose, Bld: 158 mg/dL — ABNORMAL HIGH (ref 70–99)
Glucose, Bld: 101 mg/dL — ABNORMAL HIGH (ref 70–99)
Glucose, Bld: 100 mg/dL — ABNORMAL HIGH (ref 70–99)
Potassium: 5.4 mmol/L — ABNORMAL HIGH (ref 3.5–5.1)
Potassium: 4.6 mmol/L (ref 3.5–5.1)
Potassium: 3.8 mmol/L (ref 3.5–5.1)
Sodium: 137 mmol/L (ref 135–145)
Sodium: 136 mmol/L (ref 135–145)
Sodium: 137 mmol/L (ref 135–145)

## 2019-04-15 LAB — THYROID PANEL WITH TSH
Free Thyroxine Index: 2.1 (ref 1.2–4.9)
T3 Uptake Ratio: 28 % (ref 24–39)
T4, Total: 7.5 ug/dL (ref 4.5–12.0)
TSH: 0.468 u[IU]/mL (ref 0.450–4.500)

## 2019-04-15 LAB — CBC
HCT: 30.7 % — ABNORMAL LOW (ref 36.0–46.0)
Hemoglobin: 9.9 g/dL — ABNORMAL LOW (ref 12.0–15.0)
MCH: 31 pg (ref 26.0–34.0)
MCHC: 32.2 g/dL (ref 30.0–36.0)
MCV: 96.2 fL (ref 80.0–100.0)
Platelets: 107 10*3/uL — ABNORMAL LOW (ref 150–400)
RBC: 3.19 MIL/uL — ABNORMAL LOW (ref 3.87–5.11)
RDW: 13.1 % (ref 11.5–15.5)
WBC: 8.5 10*3/uL (ref 4.0–10.5)
nRBC: 0 % (ref 0.0–0.2)

## 2019-04-15 LAB — TROPONIN I: Troponin I: <0.03 ng/mL (ref <0.03)

## 2019-04-15 LAB — LACTIC ACID, PLASMA
Lactic Acid, Venous: 2.7 mmol/L (ref 0.5–1.9)
Lactic Acid, Venous: 3.9 mmol/L (ref 0.5–1.9)
Lactic Acid, Venous: 1.2 mmol/L (ref 0.5–1.9)

## 2019-04-15 LAB — MAGNESIUM: Magnesium: 1.9 mg/dL (ref 1.7–2.4)

## 2019-04-15 IMAGING — CT CT ABDOMEN AND PELVIS WITHOUT CONTRAST
2 of 4 series · 16 of 46 positions shown, 18 images · non-contrast
Comparison: None.

CLINICAL DATA: Nausea and vomiting, history of hypertension,
dizziness.

EXAM:
CT ABDOMEN AND PELVIS WITHOUT CONTRAST
TECHNIQUE: Multidetector CT imaging of the abdomen and pelvis was performed
following the standard protocol without IV contrast.

[Series 2: routine abd/pel wo · axial · 0.61mm/px · z∈[-1214,-824]mm · 13 of 86 slices shown, 15 images]
[im 4/86  soft-tissue]
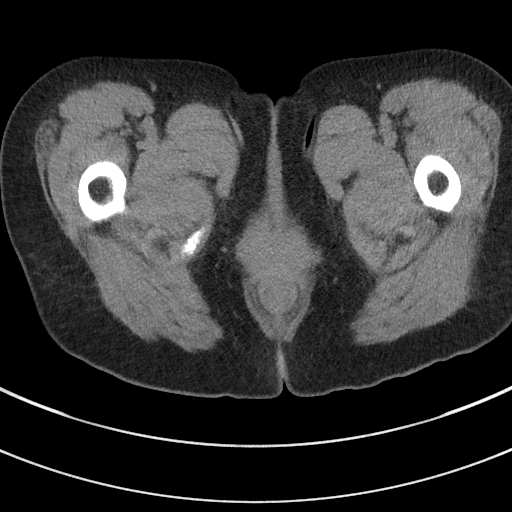
[im 4/86  bone]
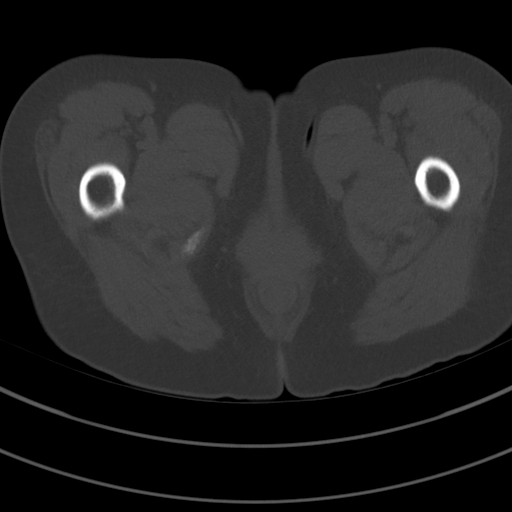
[im 11/86  soft-tissue]
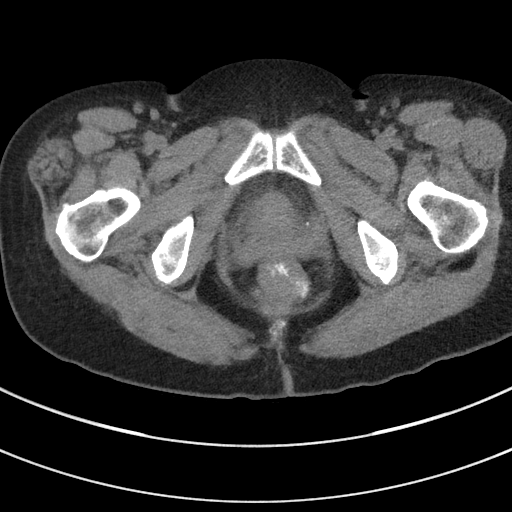
[im 18/86  soft-tissue]
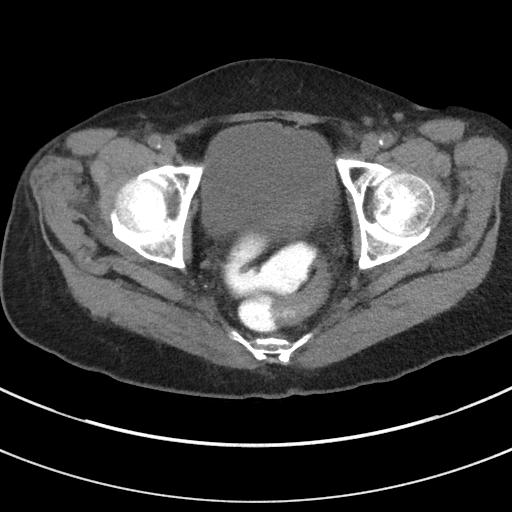
[im 24/86  soft-tissue]
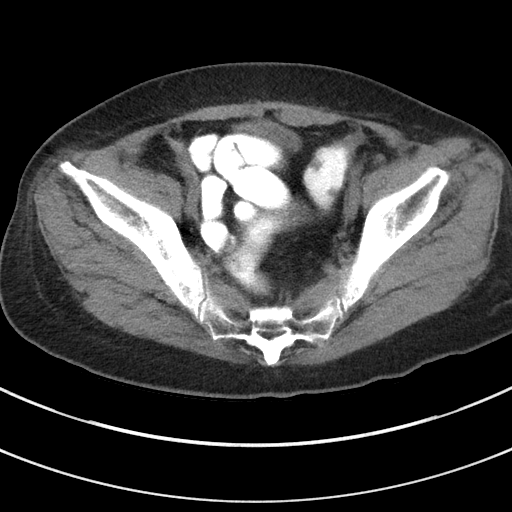
[im 31/86  soft-tissue]
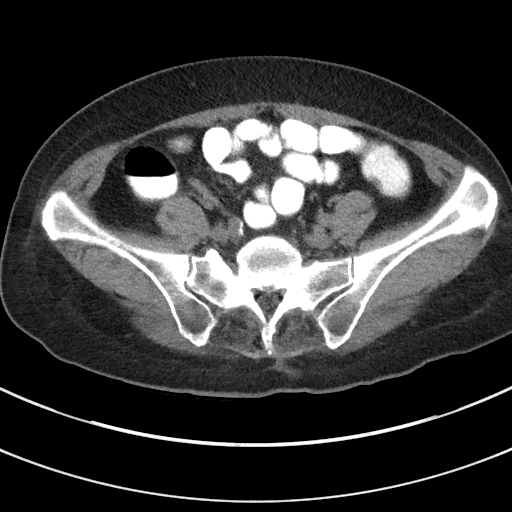
[im 38/86  soft-tissue]
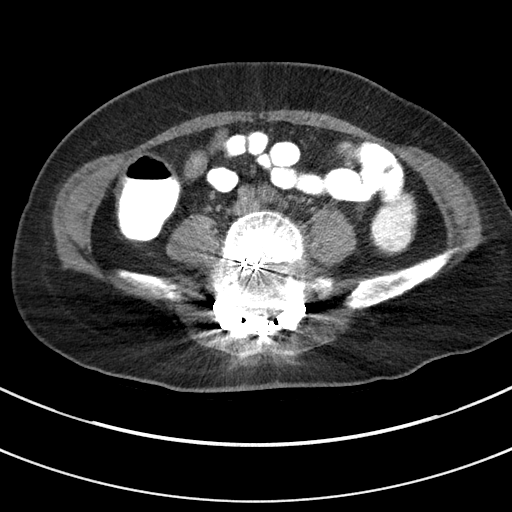
[im 45/86  soft-tissue]
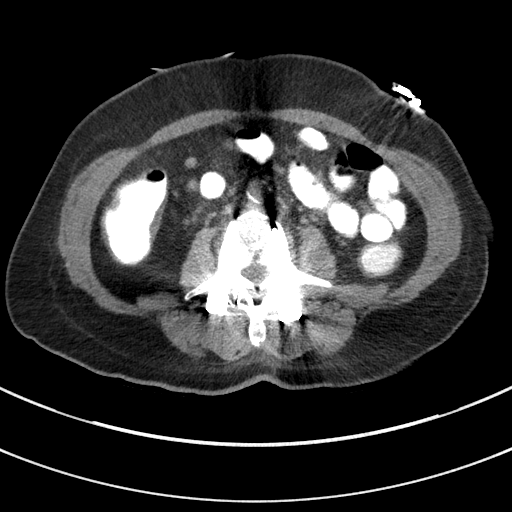
[im 48/86  soft-tissue]
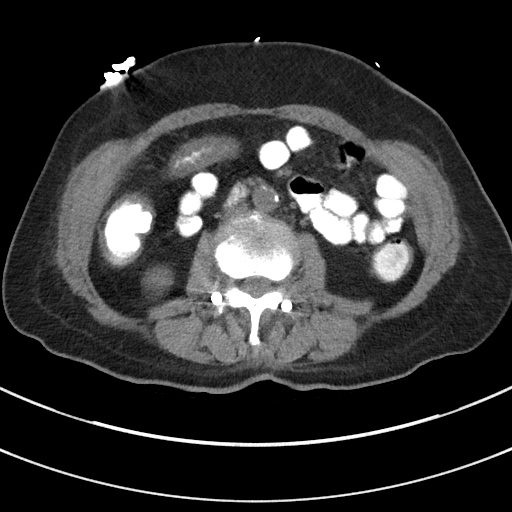
[im 55/86  soft-tissue]
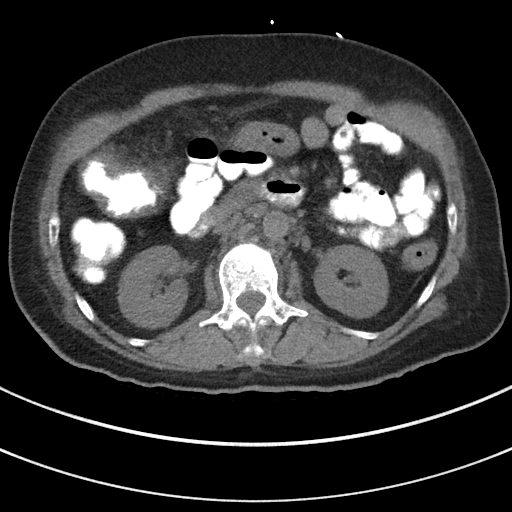
[im 55/86  bone]
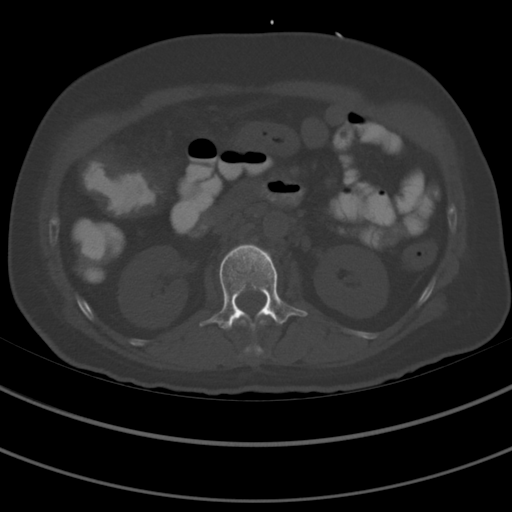
[im 62/86  soft-tissue]
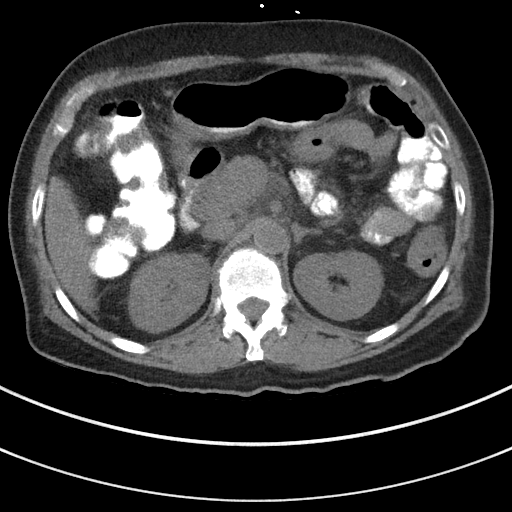
[im 69/86  soft-tissue]
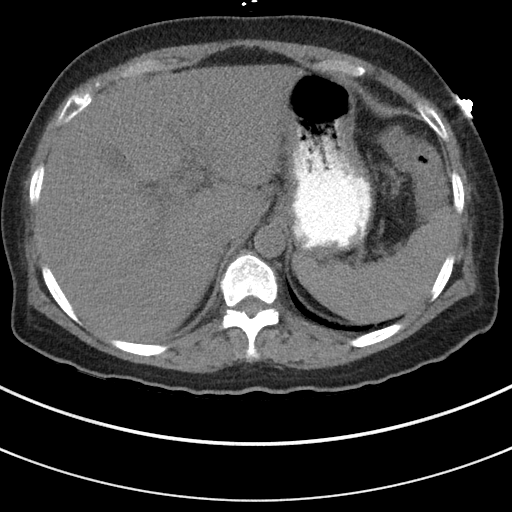
[im 75/86  soft-tissue]
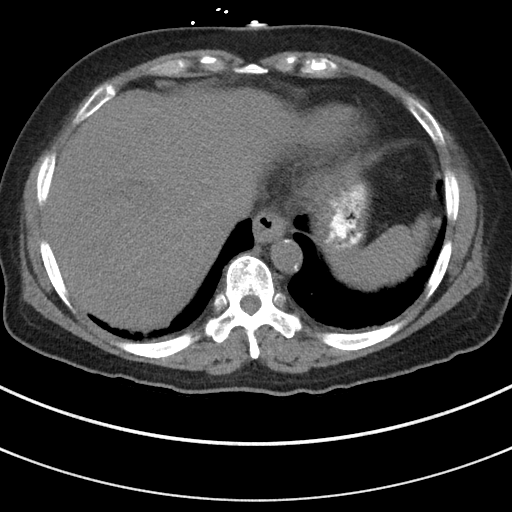
[im 82/86  soft-tissue]
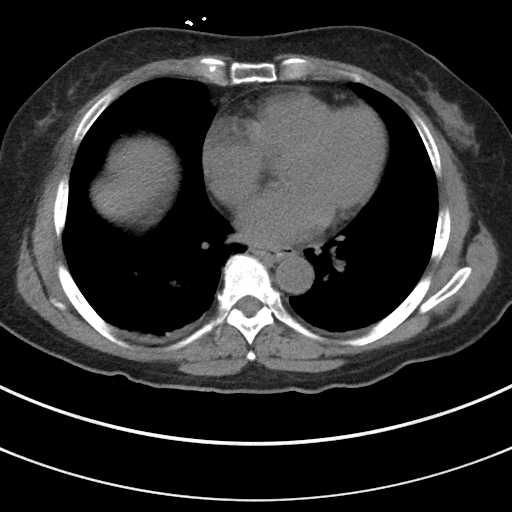

[Series 5: coronal st · coronal · 0.64mm/px · 3 of 76 slices shown]
[im 26/76  soft-tissue]
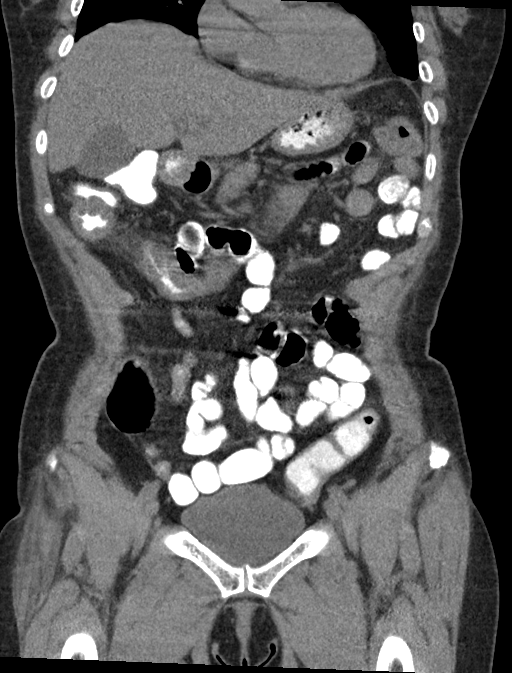
[im 34/76  soft-tissue]
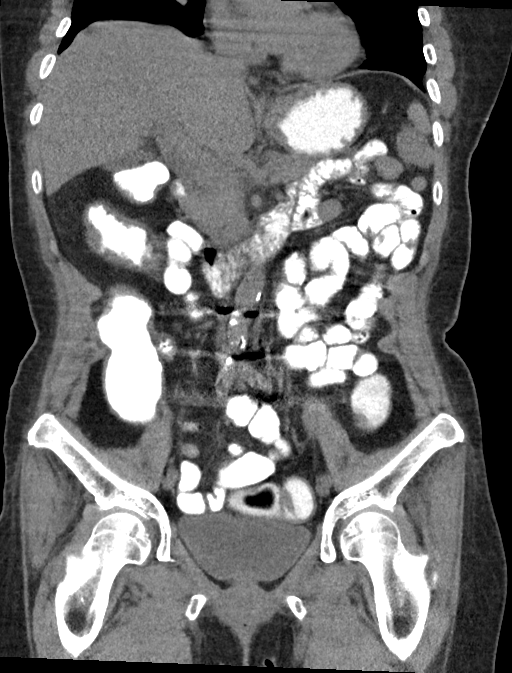
[im 42/76  soft-tissue]
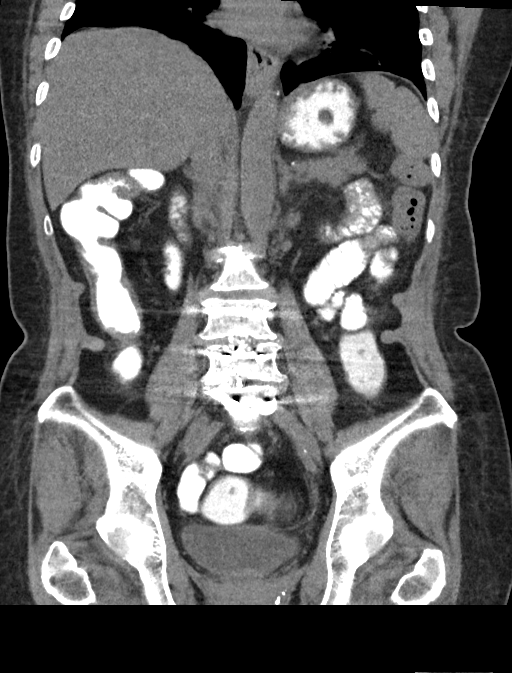

[16 of 46 positions shown; findings below may reference images not displayed]

FINDINGS: Lower chest: No acute abnormality.

Hepatobiliary: No focal liver abnormality is seen. No gallstones,
gallbladder wall thickening, or biliary dilatation.

Pancreas: Unremarkable. No pancreatic ductal dilatation or
surrounding inflammatory changes.

Spleen: Normal in size without focal abnormality.

Adrenals/Urinary Tract: Adrenal glands appear normal. Kidneys are
unremarkable without mass, stone or hydronephrosis. No perinephric
fluid. No ureteral or bladder calculi identified. Bladder appears
normal, partially decompressed.

Stomach/Bowel: No dilated large or small bowel loops. Questionable
thickening of the walls of the RIGHT colon and transverse colon with
mild pericolonic inflammation suggesting acute colitis. Appendix is
normal. Stomach is unremarkable, partially decompressed.

Vascular/Lymphatic: Aortic atherosclerosis. No enlarged lymph nodes
seen.

Reproductive: Presumed hysterectomy. No adnexal mass or free fluid
seen.

Other: No free fluid or abscess collection. No free intraperitoneal
air.

Musculoskeletal: No acute or suspicious osseous finding. Fixation
hardware at the L3 through L5 levels appears intact and
appropriately position.
IMPRESSION: 1. Suspected mild colitis of the transverse colon and RIGHT colon.
2. Remainder of the abdomen and pelvis CT is unremarkable. No bowel
obstruction. No free fluid or abscess collection. No evidence of
acute solid organ abnormality. Appendix is normal.

Aortic Atherosclerosis ([80]-[80]).

## 2019-04-15 MED ORDER — SODIUM CHLORIDE 0.9 % IV BOLUS
500.0000 mL | Freq: Once | INTRAVENOUS | Status: AC
  Administered 2019-04-15: 500 mL via INTRAVENOUS

## 2019-04-15 MED ORDER — SODIUM CHLORIDE 0.9 % IV SOLN
INTRAVENOUS | Status: DC
  Administered 2019-04-15 – 2019-04-18 (×4): via INTRAVENOUS

## 2019-04-15 MED ORDER — PIPERACILLIN-TAZOBACTAM 3.375 G IVPB
3.3750 g | Freq: Two times a day (BID) | INTRAVENOUS | Status: DC
  Administered 2019-04-15 – 2019-04-17 (×5): 3.375 g via INTRAVENOUS
  Filled 2019-04-15 (×5): qty 50

## 2019-04-15 MED ORDER — METOPROLOL TARTRATE 5 MG/5ML IV SOLN
2.5000 mg | Freq: Once | INTRAVENOUS | Status: AC
  Administered 2019-04-15: 2.5 mg via INTRAVENOUS
  Filled 2019-04-15: qty 5

## 2019-04-15 MED ORDER — SODIUM CHLORIDE 0.9 % IV BOLUS
1000.0000 mL | Freq: Once | INTRAVENOUS | Status: AC
  Administered 2019-04-15: 1000 mL via INTRAVENOUS

## 2019-04-15 MED ORDER — HEPARIN SODIUM (PORCINE) 5000 UNIT/ML IJ SOLN
5000.0000 [IU] | Freq: Three times a day (TID) | INTRAMUSCULAR | Status: DC
  Administered 2019-04-15 – 2019-04-16 (×4): 5000 [IU] via SUBCUTANEOUS
  Filled 2019-04-15 (×4): qty 1

## 2019-04-15 MED ORDER — IOHEXOL 240 MG/ML SOLN
25.0000 mL | INTRAMUSCULAR | Status: AC
  Administered 2019-04-15 (×2): 25 mL via ORAL

## 2019-04-15 NOTE — Progress Notes (Signed)
Pharmacy Electrolyte Monitoring Consult:  Pharmacy consulted to assist in monitoring and replacing electrolytes in this 73 y.o. female admitted on 04/14/2019 with Nausea and Emesis   Labs:  Sodium (mmol/L)  Date Value  04/15/2019 137  12/25/2014 141   Potassium (mmol/L)  Date Value  04/15/2019 5.4 (H)  12/25/2014 3.5   Magnesium (mg/dL)  Date Value  27/04/2375 1.8  04/14/2019 1.9   Phosphorus (mg/dL)  Date Value  28/31/5176 3.0   Calcium (mg/dL)  Date Value  16/05/3709 8.0 (L)   Calcium, Total (mg/dL)  Date Value  62/69/4854 9.2   Albumin (g/dL)  Date Value  62/70/3500 3.5    Assessment/Plan: Patient received total of 140 mEq of IV and oral potassium replacement on 5/22 on top of receiving NS/40K as MIVF.   Patient in acute renal failure, serum creatinine has increased from 1.3 to 2.22. Potassium now elevated at 5.4.  Current MIVF NS at 47mL/hr. Patient ordered NS bolus.   Electrolytes with am labs.   Pharmacy will continue to monitor and adjust per consult.   Adelbert Gaspard L 04/15/2019 1:14 PM

## 2019-04-15 NOTE — Progress Notes (Signed)
Rechecked BP after bolus of fluids- 94/81

## 2019-04-15 NOTE — Progress Notes (Signed)
BP 80/43. MD notified. Ordered bolus of Sodium Chloride. Orders followed. Will continue to monitor.

## 2019-04-15 NOTE — Progress Notes (Signed)
PT Cancellation Note  Patient Details Name: Sarah Sullivan MRN: [id number] DOB: March 04, 1946   Cancelled Treatment:    Reason Eval/Treat Not Completed: Medical issues which prohibited therapy(Last labs revela K+: 5.4. Will wait until lytes are back in safe range for OOB/exertion. PT to evaluate at later date/time. )  8:54 AM, 04/15/19 Rosamaria Lints, PT, DPT Physical Therapist - [facility]  [phone] (ASCOM)    Baylon Santelli C 04/15/2019, 8:53 AM

## 2019-04-15 NOTE — Progress Notes (Addendum)
[employer] - [place] [facility]   PATIENT NAME: Sarah Sullivan    MR#:  [id number]  DATE OF BIRTH:  Apr 28, 1946  SUBJECTIVE:  CHIEF COMPLAINT:   Chief Complaint  Patient presents with  . Nausea  . Emesis   No diarrhea Febrile overnight. Vomiting is resolved.  Systolic blood pressure in the 80s  REVIEW OF SYSTEMS:    Review of Systems  Constitutional: Positive for fever and malaise/fatigue. Negative for chills.  HENT: Negative for sore throat.   Eyes: Negative for blurred vision, double vision and pain.  Respiratory: Negative for cough, hemoptysis, shortness of breath and wheezing.   Cardiovascular: Negative for chest pain, palpitations, orthopnea and leg swelling.  Gastrointestinal: Positive for nausea. Negative for abdominal pain, constipation, diarrhea, heartburn and vomiting.  Genitourinary: Negative for dysuria and hematuria.  Musculoskeletal: Negative for back pain and joint pain.  Skin: Negative for rash.  Neurological: Negative for sensory change, speech change, focal weakness and headaches.  Endo/Heme/Allergies: Does not bruise/bleed easily.  Psychiatric/Behavioral: Negative for depression. The patient is not nervous/anxious.     DRUG ALLERGIES:  No Known Allergies  VITALS:  Blood pressure 94/81, pulse 70, temperature 98.7 F (37.1 C), resp. rate 17, height 4\' 8"  (1.422 m), weight 45.4 kg, SpO2 100 %.  PHYSICAL EXAMINATION:   Physical Exam  GENERAL:  73 y.o.-year-old patient lying in the bed with no acute distress.  EYES: Pupils equal, round, reactive to light and accommodation. No scleral icterus. Extraocular muscles intact.  HEENT: Head atraumatic, normocephalic. Oropharynx and nasopharynx clear.  NECK:  Supple, no jugular venous distention. No thyroid enlargement, no tenderness.  LUNGS: Normal breath sounds bilaterally, no wheezing, rales, rhonchi. No use of accessory muscles of respiration.  CARDIOVASCULAR: S1, S2 normal. No murmurs,  rubs, or gallops.  ABDOMEN: Soft, nontender, nondistended. Bowel sounds present. No organomegaly or mass.  EXTREMITIES: No cyanosis, clubbing or edema b/l.    NEUROLOGIC: Cranial nerves II through XII are intact. No focal Motor or sensory deficits b/l.   PSYCHIATRIC: The patient is alert and oriented x 3.  SKIN: No obvious rash, lesion, or ulcer.   LABORATORY PANEL:   CBC Recent Labs  Lab 04/15/19 0521  WBC 8.5  HGB 9.9*  HCT 30.7*  PLT 107*   ------------------------------------------------------------------------------------------------------------------ Chemistries  Recent Labs  Lab 04/14/19 1032  04/15/19 1325  NA 135   < > 136  K 2.2*   < > 4.6  CL 97*   < > 107  CO2 27   < > 20*  GLUCOSE 113*   < > 101*  BUN 27*   < > 35*  CREATININE 1.30*   < > 2.38*  CALCIUM 9.1   < > 8.1*  MG 1.9  1.8  --   --   AST 102*  --   --   ALT 78*  --   --   ALKPHOS 130*  --   --   BILITOT 1.1  --   --    < > = values in this interval not displayed.   ------------------------------------------------------------------------------------------------------------------  Cardiac Enzymes Recent Labs  Lab 04/14/19 1032  TROPONINI 0.04*   ------------------------------------------------------------------------------------------------------------------  RADIOLOGY:  Ct Abdomen Pelvis Wo Contrast  Result Date: 04/15/2019 CLINICAL DATA:  Nausea and vomiting, history of hypertension, dizziness. EXAM: CT ABDOMEN AND PELVIS WITHOUT CONTRAST TECHNIQUE: Multidetector CT imaging of the abdomen and pelvis was performed following the standard protocol without IV contrast. COMPARISON:  None. FINDINGS: Lower chest: No acute abnormality.  Hepatobiliary: No focal liver abnormality is seen. No gallstones, gallbladder wall thickening, or biliary dilatation. Pancreas: Unremarkable. No pancreatic ductal dilatation or surrounding inflammatory changes. Spleen: Normal in size without focal abnormality.  Adrenals/Urinary Tract: Adrenal glands appear normal. Kidneys are unremarkable without mass, stone or hydronephrosis. No perinephric fluid. No ureteral or bladder calculi identified. Bladder appears normal, partially decompressed. Stomach/Bowel: No dilated large or small bowel loops. Questionable thickening of the walls of the RIGHT colon and transverse colon with mild pericolonic inflammation suggesting acute colitis. Appendix is normal. Stomach is unremarkable, partially decompressed. Vascular/Lymphatic: Aortic atherosclerosis. No enlarged lymph nodes seen. Reproductive: Presumed hysterectomy. No adnexal mass or free fluid seen. Other: No free fluid or abscess collection. No free intraperitoneal air. Musculoskeletal: No acute or suspicious osseous finding. Fixation hardware at the L3 through L5 levels appears intact and appropriately position. IMPRESSION: 1. Suspected mild colitis of the transverse colon and RIGHT colon. 2. Remainder of the abdomen and pelvis CT is unremarkable. No bowel obstruction. No free fluid or abscess collection. No evidence of acute solid organ abnormality. Appendix is normal. Aortic Atherosclerosis (ICD10-I70.0). Electronically Signed   By: Bary Richard M.D.   On: 04/15/2019 11:53   Dg Abd 1 View  Result Date: 04/14/2019 CLINICAL DATA:  Nausea and vomiting for 3 days. EXAM: ABDOMEN - 1 VIEW COMPARISON:  Chest radiograph same date. FINDINGS: The bowel gas pattern is normal. There is mildly prominent stool in the distal colon. There is no supine evidence of free intraperitoneal air or suspicious abdominal calcification. There are postsurgical changes in the lumbar spine status post L3 through 5 fusion. No acute osseous findings. IMPRESSION: No acute abdominal findings. Mildly increased distal colonic stool burden, suggesting constipation. Electronically Signed   By: Carey Bullocks M.D.   On: 04/14/2019 14:32   Dg Chest Port 1 View  Result Date: 04/14/2019 CLINICAL DATA:  Weakness.   Nausea and vomiting for 3 days. EXAM: PORTABLE CHEST 1 VIEW COMPARISON:  None. FINDINGS: The cardiomediastinal silhouette is within normal limits. The lungs are mildly hypoinflated with mild bronchovascular crowding in the lung bases. No airspace consolidation, edema, pleural effusion, pneumothorax is identified. No acute osseous abnormality is seen. IMPRESSION: No active disease. Electronically Signed   By: Sebastian Ache M.D.   On: 04/14/2019 11:15     ASSESSMENT AND PLAN:   *Acute colitis with sepsis and hypotension Stat bolus of normal saline.  Lactic acid checked and elevated at 2.7.  Will repeat in 3 hours. Stat CT scan checked Start IV Zosyn  *Acute kidney injury likely due to ATN.  Patient also on meloxicam.  Stop. Fluid bolus and maintenance fluids.  Monitor input and output.  No obstruction on CT scan.  *Hypertension.  Hold losartan due to hypotension and acute kidney injury.  DVT prophylaxis with heparin  All the records are reviewed and case discussed with Care Management/Social Worker. Management plans discussed with the patient, family and they are in agreement.  CODE STATUS: Full code  DVT Prophylaxis: SCDs  TOTAL CC TIME TAKING CARE OF THIS PATIENT: 35 minutes.   POSSIBLE D/C IN 1-2 DAYS, DEPENDING ON CLINICAL CONDITION.  Sarah Sullivan M.D on 04/15/2019 at 2:16 PM  Between 7am to 6pm - Pager - [phone]  After 6pm go to [email or web address] - password [email or web address] [facility]  [facility]  Office  [phone]  CC: Primary care physician; White, Arlyss Repress, NP  Note: This dictation was prepared with Dragon dictation along with smaller phrase technology. Any transcriptional errors  that result from this process are unintentional.

## 2019-04-15 NOTE — Progress Notes (Addendum)
Pnt's HR is 120-140's and converted to AFIB. Notified Angela.Seals NP who is putting in for transfer to telemetry. Denies chest pain or SOB.  Gave tylenol for potential temperature to be contributing to AFIB. Gave 2.5mg  of IV metoprolol and 500 ml bolus over 2 hours as ordered. Heart rate did decrease between 90-115 following metoprolol, however pnt continued to be in AFIB. Notified Janeann Merl of all vitals and EKG result which stated AFIB w/RVR. Pnt continues to remain in no obvious distress.   Gave report to [place] on 2A once bed was approved and 2A was able to accept patient.  Called pnts husband to inform him of AFIB and room change per pnt request.

## 2019-04-15 NOTE — Progress Notes (Signed)
Pharmacy Antibiotic Note  Sarah Sullivan is a 73 y.o. female admitted on 04/14/2019 with intra-abdominal infection.  Pharmacy has been consulted for Zosyn dosing.  Plan: Zosyn 3.375 g IV q12h EI  Height: 4\' 8"  (142.2 cm) Weight: 100 lb (45.4 kg) IBW/kg (Calculated) : 36.3  Temp (24hrs), Avg:100 F (37.8 C), Min:98.6 F (37 C), Max:103.2 F (39.6 C)  Recent Labs  Lab 04/14/19 1032 04/15/19 0521 04/15/19 1325  WBC 8.9 8.5  --   CREATININE 1.30* 2.22* 2.38*    Estimated Creatinine Clearance: 13.3 mL/min (A) (by C-G formula based on SCr of 2.38 mg/dL (H)).    No Known Allergies  Antimicrobials this admission: Zosyn 5/23 >>  Dose adjustments this admission:   Microbiology results: SARS-CoV-2 neg 5/22 MRSA PCR: neg  Thank you for allowing pharmacy to be a part of this patient's care.  Marty Heck 04/15/2019 1:50 PM

## 2019-04-15 NOTE — Progress Notes (Signed)
CRITICAL VALUE ALERT  Critical Value:  Lactic acid 2.7  Date & Time Notied:  04/15/19   1353  Provider Notified: Dr. Elpidio Anis  Orders Received/Actions taken: Orders given to repeat lactic acid lab in 3 hours. Will continue to monitor.

## 2019-04-15 NOTE — Progress Notes (Signed)
CRITICAL VALUE ALERT  Critical Value: 3.9- Lactic Acid  Date & Time Notied:  1704 04/15/19  Provider Notified: Dr. Elpidio Anis  Orders Received/Actions taken: Notified MD. Orders given to give patient bolus sodium chloride. Orders followed. Will continue to monitor.

## 2019-04-16 ENCOUNTER — Inpatient Hospital Stay
Admit: 2019-04-16 | Discharge: 2019-04-16 | Disposition: A | Discharge status: Home / Self Care | Payer: Medicare Other | Attending: Internal Medicine | Admitting: Internal Medicine

## 2019-04-16 ENCOUNTER — Encounter: Payer: Self-pay | Admitting: Internal Medicine

## 2019-04-16 DIAGNOSIS — N170 Acute kidney failure with tubular necrosis: Secondary | ICD-10-CM | POA: Diagnosis not present

## 2019-04-16 DIAGNOSIS — D649 Anemia, unspecified: Secondary | ICD-10-CM | POA: Diagnosis not present

## 2019-04-16 DIAGNOSIS — N183 Chronic kidney disease, stage 3 (moderate): Secondary | ICD-10-CM | POA: Diagnosis present

## 2019-04-16 DIAGNOSIS — E860 Dehydration: Secondary | ICD-10-CM | POA: Diagnosis present

## 2019-04-16 DIAGNOSIS — R29810 Facial weakness: Secondary | ICD-10-CM | POA: Diagnosis not present

## 2019-04-16 DIAGNOSIS — K250 Acute gastric ulcer with hemorrhage: Secondary | ICD-10-CM | POA: Diagnosis not present

## 2019-04-16 DIAGNOSIS — K279 Peptic ulcer, site unspecified, unspecified as acute or chronic, without hemorrhage or perforation: Secondary | ICD-10-CM | POA: Diagnosis not present

## 2019-04-16 DIAGNOSIS — K644 Residual hemorrhoidal skin tags: Secondary | ICD-10-CM | POA: Diagnosis present

## 2019-04-16 DIAGNOSIS — I129 Hypertensive chronic kidney disease with stage 1 through stage 4 chronic kidney disease, or unspecified chronic kidney disease: Secondary | ICD-10-CM | POA: Diagnosis present

## 2019-04-16 DIAGNOSIS — D6959 Other secondary thrombocytopenia: Secondary | ICD-10-CM | POA: Diagnosis present

## 2019-04-16 DIAGNOSIS — Z23 Encounter for immunization: Secondary | ICD-10-CM | POA: Diagnosis present

## 2019-04-16 DIAGNOSIS — R571 Hypovolemic shock: Secondary | ICD-10-CM | POA: Diagnosis not present

## 2019-04-16 DIAGNOSIS — D696 Thrombocytopenia, unspecified: Secondary | ICD-10-CM | POA: Diagnosis not present

## 2019-04-16 DIAGNOSIS — K743 Primary biliary cirrhosis: Secondary | ICD-10-CM | POA: Diagnosis not present

## 2019-04-16 DIAGNOSIS — D62 Acute posthemorrhagic anemia: Secondary | ICD-10-CM | POA: Diagnosis not present

## 2019-04-16 DIAGNOSIS — E876 Hypokalemia: Secondary | ICD-10-CM | POA: Diagnosis present

## 2019-04-16 DIAGNOSIS — I9589 Other hypotension: Secondary | ICD-10-CM | POA: Diagnosis present

## 2019-04-16 DIAGNOSIS — G459 Transient cerebral ischemic attack, unspecified: Secondary | ICD-10-CM | POA: Diagnosis not present

## 2019-04-16 DIAGNOSIS — A4151 Sepsis due to Escherichia coli [E. coli]: Secondary | ICD-10-CM | POA: Diagnosis present

## 2019-04-16 DIAGNOSIS — K2900 Acute gastritis without bleeding: Secondary | ICD-10-CM | POA: Diagnosis not present

## 2019-04-16 DIAGNOSIS — K254 Chronic or unspecified gastric ulcer with hemorrhage: Secondary | ICD-10-CM | POA: Diagnosis present

## 2019-04-16 DIAGNOSIS — Z538 Procedure and treatment not carried out for other reasons: Secondary | ICD-10-CM | POA: Diagnosis not present

## 2019-04-16 DIAGNOSIS — R945 Abnormal results of liver function studies: Secondary | ICD-10-CM | POA: Diagnosis not present

## 2019-04-16 DIAGNOSIS — G8929 Other chronic pain: Secondary | ICD-10-CM | POA: Diagnosis present

## 2019-04-16 DIAGNOSIS — I480 Paroxysmal atrial fibrillation: Secondary | ICD-10-CM | POA: Diagnosis present

## 2019-04-16 DIAGNOSIS — K921 Melena: Secondary | ICD-10-CM | POA: Diagnosis not present

## 2019-04-16 DIAGNOSIS — N189 Chronic kidney disease, unspecified: Secondary | ICD-10-CM | POA: Diagnosis not present

## 2019-04-16 DIAGNOSIS — Z7982 Long term (current) use of aspirin: Secondary | ICD-10-CM | POA: Diagnosis not present

## 2019-04-16 DIAGNOSIS — Z20828 Contact with and (suspected) exposure to other viral communicable diseases: Secondary | ICD-10-CM | POA: Diagnosis present

## 2019-04-16 DIAGNOSIS — K264 Chronic or unspecified duodenal ulcer with hemorrhage: Secondary | ICD-10-CM | POA: Diagnosis present

## 2019-04-16 DIAGNOSIS — R29700 NIHSS score 0: Secondary | ICD-10-CM | POA: Diagnosis present

## 2019-04-16 DIAGNOSIS — R6520 Severe sepsis without septic shock: Secondary | ICD-10-CM | POA: Diagnosis present

## 2019-04-16 DIAGNOSIS — I120 Hypertensive chronic kidney disease with stage 5 chronic kidney disease or end stage renal disease: Secondary | ICD-10-CM | POA: Diagnosis not present

## 2019-04-16 DIAGNOSIS — E861 Hypovolemia: Secondary | ICD-10-CM | POA: Diagnosis not present

## 2019-04-16 DIAGNOSIS — N179 Acute kidney failure, unspecified: Secondary | ICD-10-CM | POA: Diagnosis not present

## 2019-04-16 DIAGNOSIS — D500 Iron deficiency anemia secondary to blood loss (chronic): Secondary | ICD-10-CM | POA: Diagnosis not present

## 2019-04-16 DIAGNOSIS — A09 Infectious gastroenteritis and colitis, unspecified: Secondary | ICD-10-CM | POA: Diagnosis not present

## 2019-04-16 DIAGNOSIS — K648 Other hemorrhoids: Secondary | ICD-10-CM | POA: Diagnosis present

## 2019-04-16 DIAGNOSIS — A040 Enteropathogenic Escherichia coli infection: Secondary | ICD-10-CM | POA: Diagnosis present

## 2019-04-16 DIAGNOSIS — K529 Noninfective gastroenteritis and colitis, unspecified: Secondary | ICD-10-CM | POA: Diagnosis not present

## 2019-04-16 DIAGNOSIS — N171 Acute kidney failure with acute cortical necrosis: Secondary | ICD-10-CM | POA: Diagnosis not present

## 2019-04-16 LAB — CBC WITH DIFFERENTIAL/PLATELET
Abs Immature Granulocytes: 0.07 10*3/uL (ref 0.00–0.07)
Basophils Absolute: 0 10*3/uL (ref 0.0–0.1)
Basophils Relative: 0 %
Eosinophils Absolute: 0.4 10*3/uL (ref 0.0–0.5)
Eosinophils Relative: 5 %
HCT: 36.1 % (ref 36.0–46.0)
Hemoglobin: 11.6 g/dL — ABNORMAL LOW (ref 12.0–15.0)
Immature Granulocytes: 1 %
Lymphocytes Relative: 8 %
Lymphs Abs: 0.6 10*3/uL — ABNORMAL LOW (ref 0.7–4.0)
MCH: 30.9 pg (ref 26.0–34.0)
MCHC: 32.1 g/dL (ref 30.0–36.0)
MCV: 96.3 fL (ref 80.0–100.0)
Monocytes Absolute: 0.3 10*3/uL (ref 0.1–1.0)
Monocytes Relative: 4 %
Neutro Abs: 6.3 10*3/uL (ref 1.7–7.7)
Neutrophils Relative %: 82 %
Platelets: 106 10*3/uL — ABNORMAL LOW (ref 150–400)
RBC: 3.75 MIL/uL — ABNORMAL LOW (ref 3.87–5.11)
RDW: 13.8 % (ref 11.5–15.5)
Smear Review: NORMAL
WBC: 7.7 10*3/uL (ref 4.0–10.5)
nRBC: 0 % (ref 0.0–0.2)

## 2019-04-16 LAB — COMPREHENSIVE METABOLIC PANEL
ALT: 176 U/L — ABNORMAL HIGH (ref 0–44)
AST: 270 U/L — ABNORMAL HIGH (ref 15–41)
Albumin: 2.5 g/dL — ABNORMAL LOW (ref 3.5–5.0)
Alkaline Phosphatase: 173 U/L — ABNORMAL HIGH (ref 38–126)
Anion gap: 10 (ref 5–15)
BUN: 31 mg/dL — ABNORMAL HIGH (ref 8–23)
CO2: 17 mmol/L — ABNORMAL LOW (ref 22–32)
Calcium: 7.3 mg/dL — ABNORMAL LOW (ref 8.9–10.3)
Chloride: 112 mmol/L — ABNORMAL HIGH (ref 98–111)
Creatinine, Ser: 2.11 mg/dL — ABNORMAL HIGH (ref 0.44–1.00)
GFR calc Af Amer: 26 mL/min — ABNORMAL LOW (ref >60)
GFR calc non Af Amer: 23 mL/min — ABNORMAL LOW (ref >60)
Glucose, Bld: 78 mg/dL (ref 70–99)
Potassium: 3.7 mmol/L (ref 3.5–5.1)
Sodium: 139 mmol/L (ref 135–145)
Total Bilirubin: 2.1 mg/dL — ABNORMAL HIGH (ref 0.3–1.2)
Total Protein: 5.3 g/dL — ABNORMAL LOW (ref 6.5–8.1)

## 2019-04-16 LAB — ECHOCARDIOGRAM COMPLETE
Height: 56 in
Weight: 1800 oz

## 2019-04-16 LAB — TROPONIN I
Troponin I: <0.03 ng/mL (ref <0.03)
Troponin I: 0.03 ng/mL (ref <0.03)

## 2019-04-16 LAB — CK: Total CK: 272 U/L — ABNORMAL HIGH (ref 38–234)

## 2019-04-16 LAB — MAGNESIUM: Magnesium: 2 mg/dL (ref 1.7–2.4)

## 2019-04-16 MED ORDER — SODIUM CHLORIDE 0.9 % IV BOLUS: 1000.0000 mL | Freq: Once | INTRAVENOUS | Status: DC

## 2019-04-16 NOTE — Evaluation (Signed)
Physical Therapy Evaluation Patient Details Name: Sarah Sullivan MRN: [id number] DOB: 06/23/1946 Today's Date: 04/16/2019   History of Present Illness  presented to ER secondary to nausea/vomiting; admitted with dehydration, hypokalemia (2.2 at admit) and sepsis due to acute colitis.  Rapid COVID negative; K+ improved 3.7  Clinical Impression  Upon evaluation, patient alert and oriented; follows commands, min encouragement for participation with session at times.  Bilat UE/LEs generally weak and deconditioned, but grossly [facility] for basic transfers and mobility.  No focal weakness, sensory deficit or pain reported.  Able to complete bed mobility with min assist; sit/stand, basic transfers and gait (200') with IV pole, min assist.  Attempted release of IV pole for challenge balance; patient generally unsteady and unsafe to complete without assist device.  Will plan to trial RW next session; anticipate need for RW at discharge. Vitals stable and WFL throughout session; HR 80-90s throughout with appropriate chronotropic response to activity.  Denies chest pain, palpitations or syncopal symptoms. Would benefit from skilled PT to address above deficits and promote optimal return to PLOF; Recommend transition to HHPT upon discharge from acute hospitalization.     Follow Up Recommendations Home health PT    Equipment Recommendations  Rolling walker with 5" wheels    Recommendations for Other Services       Precautions / Restrictions Precautions Precautions: Fall Restrictions Weight Bearing Restrictions: No      Mobility  Bed Mobility Overal bed mobility: Needs Assistance Bed Mobility: Supine to Sit     Supine to sit: Min assist        Transfers Overall transfer level: Needs assistance Equipment used: 1 person hand held assist Transfers: Sit to/from Stand Sit to Stand: Min assist         General transfer comment: min assist for lift off, initial standing  balance  Ambulation/Gait Ambulation/Gait assistance: Min assist Gait Distance (Feet): 200 Feet Assistive device: IV Pole       General Gait Details: reciprocal stepping pattern, decreased step height/length; slow and guarded, limited balance reactions.  Does require UE support throughout for optimal balance/safety; walker placed in room for additional trials in future.  Stairs            Wheelchair Mobility    Modified Rankin (Stroke Patients Only)       Balance Overall balance assessment: Needs assistance Sitting-balance support: No upper extremity supported;Feet supported Sitting balance-Leahy Scale: Good     Standing balance support: Bilateral upper extremity supported Standing balance-Leahy Scale: Fair                               Pertinent Vitals/Pain Pain Assessment: No/denies pain    Home Living Family/patient expects to be discharged to:: Private residence Living Arrangements: Spouse/significant other Available Help at Discharge: Family Type of Home: House Home Access: Level entry     Home Layout: One level Home Equipment: None      Prior Function Level of Independence: Independent         Comments: Indep with ADLs, household and community mobilization without assist device     Hand Dominance        Extremity/Trunk Assessment   Upper Extremity Assessment Upper Extremity Assessment: Generalized weakness    Lower Extremity Assessment Lower Extremity Assessment: Generalized weakness(grossly 4-/5 throughout)       Communication   Communication: No difficulties  Cognition Arousal/Alertness: Awake/alert Behavior During Therapy: WFL for tasks assessed/performed Overall Cognitive Status:  Within Functional Limits for tasks assessed                                 General Comments: mild language barrier with complex information/conversation?      General Comments      Exercises     Assessment/Plan     PT Assessment Patient needs continued PT services  PT Problem List Decreased strength;Decreased activity tolerance;Decreased balance;Decreased mobility;Decreased safety awareness;Decreased knowledge of precautions;Cardiopulmonary status limiting activity       PT Treatment Interventions DME instruction;Gait training;Functional mobility training;Therapeutic activities;Therapeutic exercise;Balance training;Patient/family education    PT Goals (Current goals can be found in the Care Plan section)  Acute Rehab PT Goals Patient Stated Goal: to return home PT Goal Formulation: With patient Time For Goal Achievement: 04/30/19 Potential to Achieve Goals: Good    Frequency Min 2X/week   Barriers to discharge        Co-evaluation               AM-PAC PT "6 Clicks" Mobility  Outcome Measure Help needed turning from your back to your side while in a flat bed without using bedrails?: A Little Help needed moving from lying on your back to sitting on the side of a flat bed without using bedrails?: A Little Help needed moving to and from a bed to a chair (including a wheelchair)?: A Little Help needed standing up from a chair using your arms (e.g., wheelchair or bedside chair)?: A Little Help needed to walk in hospital room?: A Little Help needed climbing 3-5 steps with a railing? : A Little 6 Click Score: 18    End of Session Equipment Utilized During Treatment: Gait belt Activity Tolerance: Patient tolerated treatment well Patient left: in chair;with call bell/phone within reach;with chair alarm set Nurse Communication: Mobility status PT Visit Diagnosis: Muscle weakness (generalized) (M62.81);Difficulty in walking, not elsewhere classified (R26.2)    Time: 1210-1229 PT Time Calculation (min) (ACUTE ONLY): 19 min   Charges:   PT Evaluation $PT Eval Moderate Complexity: 1 Mod          Acquanetta Cabanilla H. Manson Passey, PT, DPT, NCS 04/16/19, 1:26 PM [phone]

## 2019-04-16 NOTE — Progress Notes (Signed)
Pt sat up in chair for long periods today, then walked with RN around unit. Still co mild dizzyness when up, BP has been mostly 90-105 systolic. NO co pf pain or discomfort. Pt has not eaten well today, has had 2 BM, that were loose.

## 2019-04-16 NOTE — Progress Notes (Signed)
Notified MD of pt's low blood pressure. No new orders at this time. Will continue to monitor and assess

## 2019-04-16 NOTE — Consult Note (Signed)
[facility]  CARDIOLOGY CONSULT NOTE  Patient ID: Sarah Sullivan MRN: [id number] DOB/AGE: 74-Jul-1947 74 y.o.  Admit date: 04/14/2019 Referring Physician Sudini Primary Physician [facility] Primary Cardiologist  Reason for Consultation atrial fibrillation  HPI: 73 year old female referred for evaluation of paroxysmal atrial fibrillation.  Patient presents with nausea, vomiting, dizziness, and dehydration.  Admission labs notable for potassium of 2.2, BUN and creatinine of 32 and 2.28, respectively.  Patient was orthostatic with low blood pressure.  Elevated lactic acid  consistent with sepsis in the setting of acute colitis.  The patient experienced intermittent atrial fibrillation with rapid ventricular rate, currently in sinus rhythm at 80 bpm.  She was treated with intravenous fluids, potassium repletion, and broad-spectrum antibiotics.  Patient denies history of atrial fibrillation.  Review of systems complete and found to be negative unless listed above     Past Medical History:  Diagnosis Date  . Hypertension     Past Surgical History:  Procedure Laterality Date  . ABDOMINAL HYSTERECTOMY      Medications Prior to Admission  Medication Sig Dispense Refill Last Dose  . aspirin 81 MG chewable tablet Chew 81 mg by mouth daily.   unknown at unknown  . atorvastatin (LIPITOR) 80 MG tablet Take 80 mg by mouth daily.   unknown at unknown  . chlorthalidone (HYGROTON) 25 MG tablet Take 25 mg by mouth daily.   unknown at unknown  . desvenlafaxine (PRISTIQ) 50 MG 24 hr tablet Take 50 mg by mouth daily.   unknown at unknown  . felodipine (PLENDIL) 10 MG 24 hr tablet Take 10 mg by mouth daily.   unknown at unknown  . losartan (COZAAR) 50 MG tablet Take 50 mg by mouth daily.   unknown at unknown  . meloxicam (MOBIC) 15 MG tablet Take 15 mg by mouth daily as needed for pain.    unknown at unknown  . simvastatin (ZOCOR) 20 MG tablet Take 20 mg by mouth daily.      Social History   Socioeconomic  History  . Marital status: Married    Spouse name: Not on file  . Number of children: Not on file  . Years of education: Not on file  . Highest education level: Not on file  Occupational History  . Not on file  Social Needs  . Financial resource strain: Not on file  . Food insecurity:    Worry: Not on file    Inability: Not on file  . Transportation needs:    Medical: Not on file    Non-medical: Not on file  Tobacco Use  . Smoking status: Never Smoker  . Smokeless tobacco: Never Used  Substance and Sexual Activity  . Alcohol use: Not Currently  . Drug use: Not Currently  . Sexual activity: Not on file  Lifestyle  . Physical activity:    Days per week: Not on file    Minutes per session: Not on file  . Stress: Not on file  Relationships  . Social connections:    Talks on phone: Not on file    Gets together: Not on file    Attends religious service: Not on file    Active member of club or organization: Not on file    Attends meetings of clubs or organizations: Not on file    Relationship status: Not on file  . Intimate partner violence:    Fear of current or ex partner: Not on file    Emotionally abused: Not on file  Physically abused: Not on file    Forced sexual activity: Not on file  Other Topics Concern  . Not on file  Social History Narrative  . Not on file    History reviewed. No pertinent family history.    Review of systems complete and found to be negative unless listed above      PHYSICAL EXAM  General: Well developed, well nourished, in no acute distress HEENT:  Normocephalic and atramatic Neck:  No JVD.  Lungs: Clear bilaterally to auscultation and percussion. Heart: HRRR . Normal S1 and S2 without gallops or murmurs.  Abdomen: Bowel sounds are positive, abdomen soft and non-tender  Msk:  Back normal, normal gait. Normal strength and tone for age. Extremities: No clubbing, cyanosis or edema.   Neuro: Alert and oriented X 3. Psych:  Good  affect, responds appropriately  Labs:   Lab Results  Component Value Date   WBC 7.7 04/16/2019   HGB 11.6 (L) 04/16/2019   HCT 36.1 04/16/2019   MCV 96.3 04/16/2019   PLT 106 (L) 04/16/2019    Recent Labs  Lab 04/16/19 0351  NA 139  K 3.7  CL 112*  CO2 17*  BUN 31*  CREATININE 2.11*  CALCIUM 7.3*  PROT 5.3*  BILITOT 2.1*  ALKPHOS 173*  ALT 176*  AST 270*  GLUCOSE 78   Lab Results  Component Value Date   TROPONINI <0.03 04/16/2019   No results found for: CHOL No results found for: HDL No results found for: LDLCALC No results found for: TRIG No results found for: CHOLHDL No results found for: LDLDIRECT    Radiology: Ct Abdomen Pelvis Wo Contrast  Result Date: 04/15/2019 CLINICAL DATA:  Nausea and vomiting, history of hypertension, dizziness. EXAM: CT ABDOMEN AND PELVIS WITHOUT CONTRAST TECHNIQUE: Multidetector CT imaging of the abdomen and pelvis was performed following the standard protocol without IV contrast. COMPARISON:  None. FINDINGS: Lower chest: No acute abnormality. Hepatobiliary: No focal liver abnormality is seen. No gallstones, gallbladder wall thickening, or biliary dilatation. Pancreas: Unremarkable. No pancreatic ductal dilatation or surrounding inflammatory changes. Spleen: Normal in size without focal abnormality. Adrenals/Urinary Tract: Adrenal glands appear normal. Kidneys are unremarkable without mass, stone or hydronephrosis. No perinephric fluid. No ureteral or bladder calculi identified. Bladder appears normal, partially decompressed. Stomach/Bowel: No dilated large or small bowel loops. Questionable thickening of the walls of the RIGHT colon and transverse colon with mild pericolonic inflammation suggesting acute colitis. Appendix is normal. Stomach is unremarkable, partially decompressed. Vascular/Lymphatic: Aortic atherosclerosis. No enlarged lymph nodes seen. Reproductive: Presumed hysterectomy. No adnexal mass or free fluid seen. Other: No free fluid  or abscess collection. No free intraperitoneal air. Musculoskeletal: No acute or suspicious osseous finding. Fixation hardware at the L3 through L5 levels appears intact and appropriately position. IMPRESSION: 1. Suspected mild colitis of the transverse colon and RIGHT colon. 2. Remainder of the abdomen and pelvis CT is unremarkable. No bowel obstruction. No free fluid or abscess collection. No evidence of acute solid organ abnormality. Appendix is normal. Aortic Atherosclerosis (ICD10-I70.0). Electronically Signed   By: Bary Richard M.D.   On: 04/15/2019 11:53   Dg Abd 1 View  Result Date: 04/14/2019 CLINICAL DATA:  Nausea and vomiting for 3 days. EXAM: ABDOMEN - 1 VIEW COMPARISON:  Chest radiograph same date. FINDINGS: The bowel gas pattern is normal. There is mildly prominent stool in the distal colon. There is no supine evidence of free intraperitoneal air or suspicious abdominal calcification. There are postsurgical changes in the  lumbar spine status post L3 through 5 fusion. No acute osseous findings. IMPRESSION: No acute abdominal findings. Mildly increased distal colonic stool burden, suggesting constipation. Electronically Signed   By: Carey Bullocks M.D.   On: 04/14/2019 14:32   Dg Chest Port 1 View  Result Date: 04/14/2019 CLINICAL DATA:  Weakness.  Nausea and vomiting for 3 days. EXAM: PORTABLE CHEST 1 VIEW COMPARISON:  None. FINDINGS: The cardiomediastinal silhouette is within normal limits. The lungs are mildly hypoinflated with mild bronchovascular crowding in the lung bases. No airspace consolidation, edema, pleural effusion, pneumothorax is identified. No acute osseous abnormality is seen. IMPRESSION: No active disease. Electronically Signed   By: Sebastian Ache M.D.   On: 04/14/2019 11:15    EKG: Atrial fibrillation at a rate 124 bpm  ASSESSMENT AND PLAN:   1.  Paroxysmal atrial fibrillation with rapid ventricular rate, secondary to hypokalemia / dehydration / sepsis, currently in  sinus rhythm after rehydration and potassium repletion 2.  Hypokalemia, normalized after potassium repletion 3.  Dehydration/ AKI / ATN 4.  Acute colitis / sepsis  Recommendations  1.  Agree with current therapy 2.  No indication for anticoagulation since atrial fibrillation secondary to reversible cause 3.  Replete potassium 4.  No indication for AV nodal blocking agent at this time  Sign off for now, please call if any questions  Signed: Marcina Millard MD,PhD, [facility] 04/16/2019, 8:09 AM

## 2019-04-16 NOTE — Progress Notes (Signed)
Pt transferred from [facility]. Pt alert and oriented with no complaints of pain. Blood pressure low, 80 systolic. Will continue to monitor and assess.

## 2019-04-16 NOTE — Progress Notes (Signed)
Pt requested regular coca cola, RN called dining services and was told to place a note that is ok. It is acceptable.

## 2019-04-16 NOTE — Progress Notes (Signed)
[facility] [place] [facility]   PATIENT NAME: Sarah Sullivan    MR#:  [id number]  DATE OF BIRTH:  22-Feb-1946  SUBJECTIVE:  CHIEF COMPLAINT:   Chief Complaint  Patient presents with  . Nausea  . Emesis   No abdominal pain or diarrhea.  Complains of chronic bilateral thigh pain.  Gets dizzy on standing.  Extremely fatigued. Afebrile.  Had episode of atrial fibrillation overnight and received IV metoprolol.  Today in normal sinus rhythm.  Seen by cardiology.  REVIEW OF SYSTEMS:    Review of Systems  Constitutional: Positive for fever and malaise/fatigue. Negative for chills.  HENT: Negative for sore throat.   Eyes: Negative for blurred vision, double vision and pain.  Respiratory: Negative for cough, hemoptysis, shortness of breath and wheezing.   Cardiovascular: Negative for chest pain, palpitations, orthopnea and leg swelling.  Gastrointestinal: Positive for nausea. Negative for abdominal pain, constipation, diarrhea, heartburn and vomiting.  Genitourinary: Negative for dysuria and hematuria.  Musculoskeletal: Negative for back pain and joint pain.  Skin: Negative for rash.  Neurological: Negative for sensory change, speech change, focal weakness and headaches.  Endo/Heme/Allergies: Does not bruise/bleed easily.  Psychiatric/Behavioral: Negative for depression. The patient is not nervous/anxious.     DRUG ALLERGIES:  No Known Allergies  VITALS:  Blood pressure (!) 108/56, pulse 83, temperature (!) 97.3 F (36.3 C), temperature source Oral, resp. rate 20, height 4\' 8"  (1.422 m), weight 51 kg, SpO2 95 %.  PHYSICAL EXAMINATION:   Physical Exam  GENERAL:  73 y.o.-year-old patient lying in the bed with no acute distress.  EYES: Pupils equal, round, reactive to light and accommodation. No scleral icterus. Extraocular muscles intact.  HEENT: Head atraumatic, normocephalic. Oropharynx and nasopharynx clear.  NECK:  Supple, no jugular venous distention. No  thyroid enlargement, no tenderness.  LUNGS: Normal breath sounds bilaterally, no wheezing, rales, rhonchi. No use of accessory muscles of respiration.  CARDIOVASCULAR: S1, S2 normal. No murmurs, rubs, or gallops.  ABDOMEN: Soft, nontender, nondistended. Bowel sounds present. No organomegaly or mass.  EXTREMITIES: No cyanosis, clubbing or edema b/l.    NEUROLOGIC: Cranial nerves II through XII are intact. No focal Motor or sensory deficits b/l.   PSYCHIATRIC: The patient is alert and oriented x 3.  SKIN: No obvious rash, lesion, or ulcer.   LABORATORY PANEL:   CBC Recent Labs  Lab 04/16/19 0351  WBC 7.7  HGB 11.6*  HCT 36.1  PLT 106*   ------------------------------------------------------------------------------------------------------------------ Chemistries  Recent Labs  Lab 04/16/19 0351  NA 139  K 3.7  CL 112*  CO2 17*  GLUCOSE 78  BUN 31*  CREATININE 2.11*  CALCIUM 7.3*  MG 2.0  AST 270*  ALT 176*  ALKPHOS 173*  BILITOT 2.1*   ------------------------------------------------------------------------------------------------------------------  Cardiac Enzymes Recent Labs  Lab 04/16/19 0946  TROPONINI 0.03*   ------------------------------------------------------------------------------------------------------------------  RADIOLOGY:  Ct Abdomen Pelvis Wo Contrast  Result Date: 04/15/2019 CLINICAL DATA:  Nausea and vomiting, history of hypertension, dizziness. EXAM: CT ABDOMEN AND PELVIS WITHOUT CONTRAST TECHNIQUE: Multidetector CT imaging of the abdomen and pelvis was performed following the standard protocol without IV contrast. COMPARISON:  None. FINDINGS: Lower chest: No acute abnormality. Hepatobiliary: No focal liver abnormality is seen. No gallstones, gallbladder wall thickening, or biliary dilatation. Pancreas: Unremarkable. No pancreatic ductal dilatation or surrounding inflammatory changes. Spleen: Normal in size without focal abnormality.  Adrenals/Urinary Tract: Adrenal glands appear normal. Kidneys are unremarkable without mass, stone or hydronephrosis. No perinephric fluid. No ureteral or  bladder calculi identified. Bladder appears normal, partially decompressed. Stomach/Bowel: No dilated large or small bowel loops. Questionable thickening of the walls of the RIGHT colon and transverse colon with mild pericolonic inflammation suggesting acute colitis. Appendix is normal. Stomach is unremarkable, partially decompressed. Vascular/Lymphatic: Aortic atherosclerosis. No enlarged lymph nodes seen. Reproductive: Presumed hysterectomy. No adnexal mass or free fluid seen. Other: No free fluid or abscess collection. No free intraperitoneal air. Musculoskeletal: No acute or suspicious osseous finding. Fixation hardware at the L3 through L5 levels appears intact and appropriately position. IMPRESSION: 1. Suspected mild colitis of the transverse colon and RIGHT colon. 2. Remainder of the abdomen and pelvis CT is unremarkable. No bowel obstruction. No free fluid or abscess collection. No evidence of acute solid organ abnormality. Appendix is normal. Aortic Atherosclerosis (ICD10-I70.0). Electronically Signed   By: Bary RichardStan  Maynard M.D.   On: 04/15/2019 11:53   Dg Abd 1 View  Result Date: 04/14/2019 CLINICAL DATA:  Nausea and vomiting for 3 days. EXAM: ABDOMEN - 1 VIEW COMPARISON:  Chest radiograph same date. FINDINGS: The bowel gas pattern is normal. There is mildly prominent stool in the distal colon. There is no supine evidence of free intraperitoneal air or suspicious abdominal calcification. There are postsurgical changes in the lumbar spine status post L3 through 5 fusion. No acute osseous findings. IMPRESSION: No acute abdominal findings. Mildly increased distal colonic stool burden, suggesting constipation. Electronically Signed   By: Carey BullocksWilliam  Veazey M.D.   On: 04/14/2019 14:32     ASSESSMENT AND PLAN:   *Acute colitis with sepsis and  hypotension IVF IV zosyn Blood Cx pending  * Afib RVR due to sepsis Now in NSR. No anti coagulation Appreciate cardiology input  *Acute kidney injury likely due to ATN.  Patient also on meloxicam.  Stopped. Fluid bolus and maintenance fluids.   Monitor UOP Repeat labs in AM  *Hypertension.  Hold losartan and felodipine due to hypotension  DVT prophylaxis with heparin  All the records are reviewed and case discussed with Care Management/Social Worker. Management plans discussed with the patient, family and they are in agreement.  CODE STATUS: Full code  DVT Prophylaxis: SCDs  TOTAL CC TIME TAKING CARE OF THIS PATIENT: 35 minutes.   POSSIBLE D/C IN 1-2 DAYS, DEPENDING ON CLINICAL CONDITION.  Molinda BailiffSrikar R Welford Christmas M.D on 04/16/2019 at 12:35 PM  Between 7am to 6pm - Pager - [phone]  After 6pm go to [email or web address] - password [email or web address] [facility]  [facility] [place] [facility]  Office  [phone]  CC: Primary care physician; White, Arlyss RepressElizabeth Burney, NP  Note: This dictation was prepared with Dragon dictation along with smaller phrase technology. Any transcriptional errors that result from this process are unintentional.

## 2019-04-16 NOTE — Progress Notes (Signed)
Pharmacy Electrolyte Monitoring Consult:  Pharmacy consulted to assist in monitoring and replacing electrolytes in this 73 y.o. female admitted on 04/14/2019 with Nausea and Emesis   Labs:  Sodium (mmol/L)  Date Value  04/16/2019 139  12/25/2014 141   Potassium (mmol/L)  Date Value  04/16/2019 3.7  12/25/2014 3.5   Magnesium (mg/dL)  Date Value  55/97/4163 2.0   Phosphorus (mg/dL)  Date Value  84/53/6468 3.0   Calcium (mg/dL)  Date Value  02/11/2247 7.3 (L)   Calcium, Total (mg/dL)  Date Value  25/00/3704 9.2   Albumin (g/dL)  Date Value  88/89/1694 2.5 (L)    Assessment/Plan: Patient received total of 140 mEq of IV and oral potassium replacement on 5/22 on top of receiving NS/40K as MIVF.   Patient in acute renal failure, serum creatinine has increased from 1.3 to 2.22. Potassium now elevated at 5.4.  5/24 K 3.7 - no supplementation needed at this time  Current MIVF NS at 54mL/hr.   Electrolytes with am labs.   Pharmacy will continue to monitor and adjust per consult.   Marty Heck 04/16/2019 3:06 PM

## 2019-04-17 ENCOUNTER — Inpatient Hospital Stay: Payer: Medicare Other

## 2019-04-17 DIAGNOSIS — K529 Noninfective gastroenteritis and colitis, unspecified: Secondary | ICD-10-CM

## 2019-04-17 DIAGNOSIS — N170 Acute kidney failure with tubular necrosis: Secondary | ICD-10-CM

## 2019-04-17 DIAGNOSIS — R945 Abnormal results of liver function studies: Secondary | ICD-10-CM

## 2019-04-17 DIAGNOSIS — I9589 Other hypotension: Secondary | ICD-10-CM

## 2019-04-17 DIAGNOSIS — E861 Hypovolemia: Secondary | ICD-10-CM

## 2019-04-17 DIAGNOSIS — K2900 Acute gastritis without bleeding: Secondary | ICD-10-CM

## 2019-04-17 LAB — CBC WITH DIFFERENTIAL/PLATELET
Abs Immature Granulocytes: 0.03 10*3/uL (ref 0.00–0.07)
Basophils Absolute: 0 10*3/uL (ref 0.0–0.1)
Basophils Relative: 0 %
Eosinophils Absolute: 0.5 10*3/uL (ref 0.0–0.5)
Eosinophils Relative: 8 %
HCT: 24.9 % — ABNORMAL LOW (ref 36.0–46.0)
Hemoglobin: 7.6 g/dL — ABNORMAL LOW (ref 12.0–15.0)
Immature Granulocytes: 1 %
Lymphocytes Relative: 10 %
Lymphs Abs: 0.6 10*3/uL — ABNORMAL LOW (ref 0.7–4.0)
MCH: 31 pg (ref 26.0–34.0)
MCHC: 30.5 g/dL (ref 30.0–36.0)
MCV: 101.6 fL — ABNORMAL HIGH (ref 80.0–100.0)
Monocytes Absolute: 0.2 10*3/uL (ref 0.1–1.0)
Monocytes Relative: 4 %
Neutro Abs: 4.2 10*3/uL (ref 1.7–7.7)
Neutrophils Relative %: 77 %
Platelets: 90 10*3/uL — ABNORMAL LOW (ref 150–400)
RBC: 2.45 MIL/uL — ABNORMAL LOW (ref 3.87–5.11)
RDW: 13.9 % (ref 11.5–15.5)
WBC: 5.5 10*3/uL (ref 4.0–10.5)
nRBC: 0 % (ref 0.0–0.2)

## 2019-04-17 LAB — COMPREHENSIVE METABOLIC PANEL
ALT: 281 U/L — ABNORMAL HIGH (ref 0–44)
AST: 482 U/L — ABNORMAL HIGH (ref 15–41)
Albumin: 1.8 g/dL — ABNORMAL LOW (ref 3.5–5.0)
Alkaline Phosphatase: 181 U/L — ABNORMAL HIGH (ref 38–126)
Anion gap: 7 (ref 5–15)
BUN: 58 mg/dL — ABNORMAL HIGH (ref 8–23)
CO2: 16 mmol/L — ABNORMAL LOW (ref 22–32)
Calcium: 6.7 mg/dL — ABNORMAL LOW (ref 8.9–10.3)
Chloride: 113 mmol/L — ABNORMAL HIGH (ref 98–111)
Creatinine, Ser: 2.91 mg/dL — ABNORMAL HIGH (ref 0.44–1.00)
GFR calc Af Amer: 18 mL/min — ABNORMAL LOW (ref >60)
GFR calc non Af Amer: 15 mL/min — ABNORMAL LOW (ref >60)
Glucose, Bld: 118 mg/dL — ABNORMAL HIGH (ref 70–99)
Potassium: 3.4 mmol/L — ABNORMAL LOW (ref 3.5–5.1)
Sodium: 136 mmol/L (ref 135–145)
Total Bilirubin: 1.2 mg/dL (ref 0.3–1.2)
Total Protein: 4.2 g/dL — ABNORMAL LOW (ref 6.5–8.1)

## 2019-04-17 LAB — HEMOGLOBIN AND HEMATOCRIT, BLOOD
HCT: 24.8 % — ABNORMAL LOW (ref 36.0–46.0)
HCT: 25.2 % — ABNORMAL LOW (ref 36.0–46.0)
HCT: 22.6 % — ABNORMAL LOW (ref 36.0–46.0)
Hemoglobin: 8 g/dL — ABNORMAL LOW (ref 12.0–15.0)
Hemoglobin: 8 g/dL — ABNORMAL LOW (ref 12.0–15.0)
Hemoglobin: 7.3 g/dL — ABNORMAL LOW (ref 12.0–15.0)

## 2019-04-17 LAB — IRON AND TIBC
Iron: 20 ug/dL — ABNORMAL LOW (ref 28–170)
Saturation Ratios: 14 % (ref 10.4–31.8)
TIBC: 142 ug/dL — ABNORMAL LOW (ref 250–450)
UIBC: 122 ug/dL

## 2019-04-17 LAB — FOLATE: Folate: 18.3 ng/mL (ref >5.9)

## 2019-04-17 LAB — PROTIME-INR
INR: 1 (ref 0.8–1.2)
Prothrombin Time: 13.1 seconds (ref 11.4–15.2)

## 2019-04-17 LAB — MRSA PCR SCREENING: MRSA by PCR: NEGATIVE

## 2019-04-17 LAB — PLATELET COUNT: Platelets: 84 10*3/uL — ABNORMAL LOW (ref 150–400)

## 2019-04-17 LAB — FERRITIN: Ferritin: 1380 ng/mL — ABNORMAL HIGH (ref 11–307)

## 2019-04-17 LAB — VITAMIN B12: Vitamin B-12: 783 pg/mL (ref 180–914)

## 2019-04-17 IMAGING — US US RENAL
1 series · 14 of 25 positions shown · non-contrast
Comparison: CT from [DATE]

CLINICAL DATA: Acute renal failure

EXAM:
RENAL / URINARY TRACT ULTRASOUND COMPLETE

[Series 1: us renal · 14 of 68 slices shown]
[im 1/68]
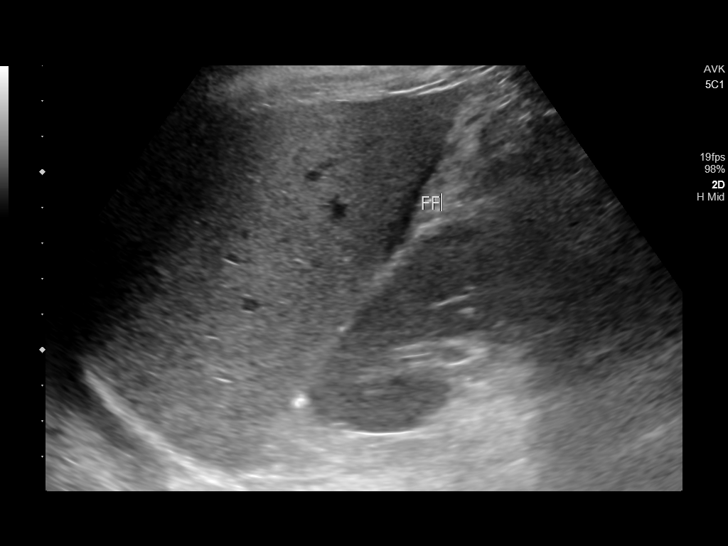
[im 6/68]
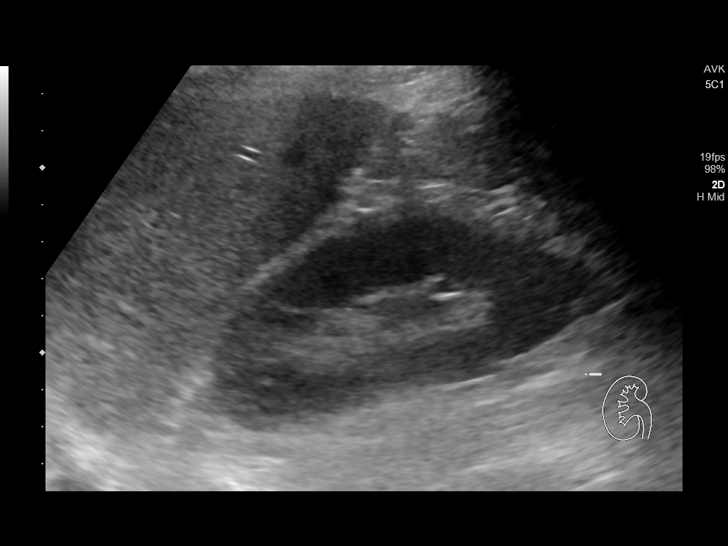
[im 12/68]
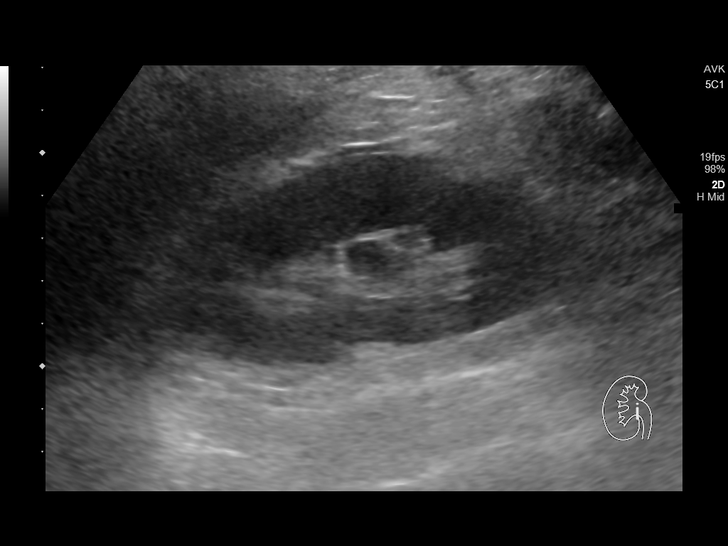
[im 17/68]
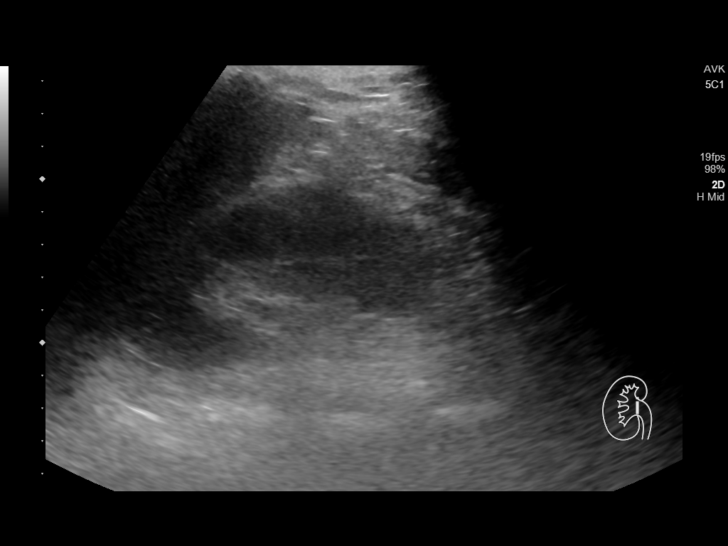
[im 23/68]
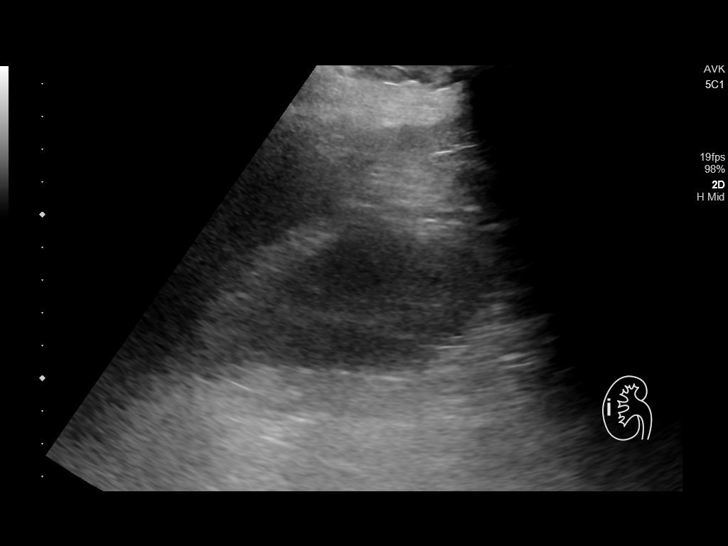
[im 26/68]
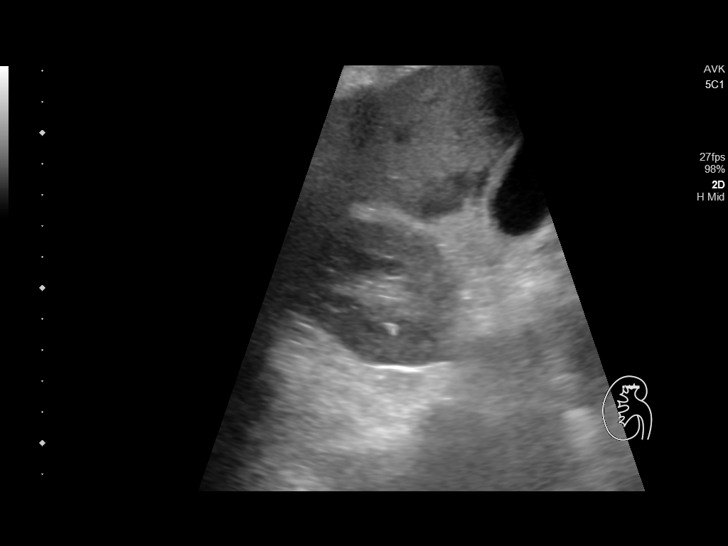
[im 31/68]
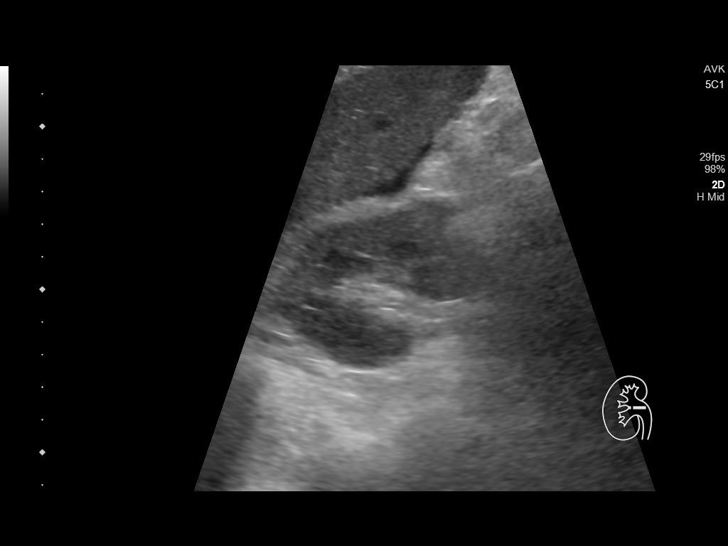
[im 37/68]
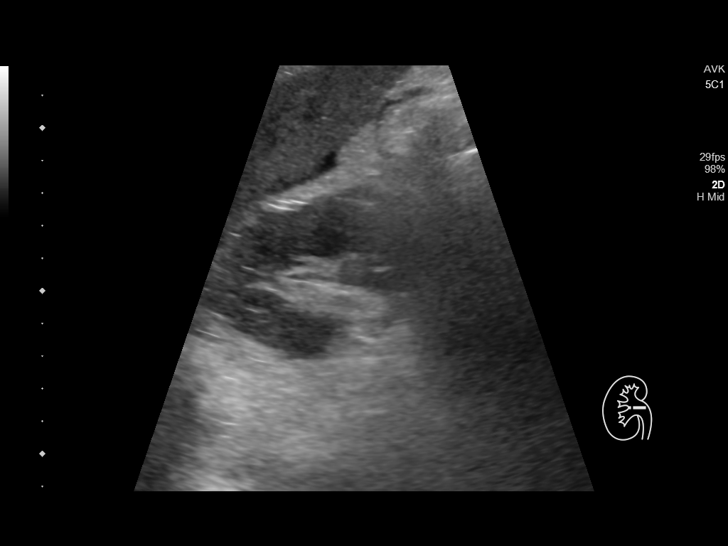
[im 42/68]
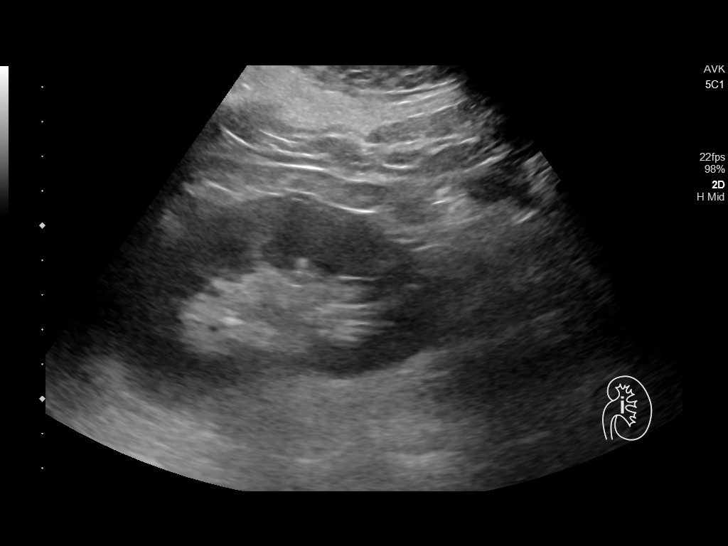
[im 45/68]
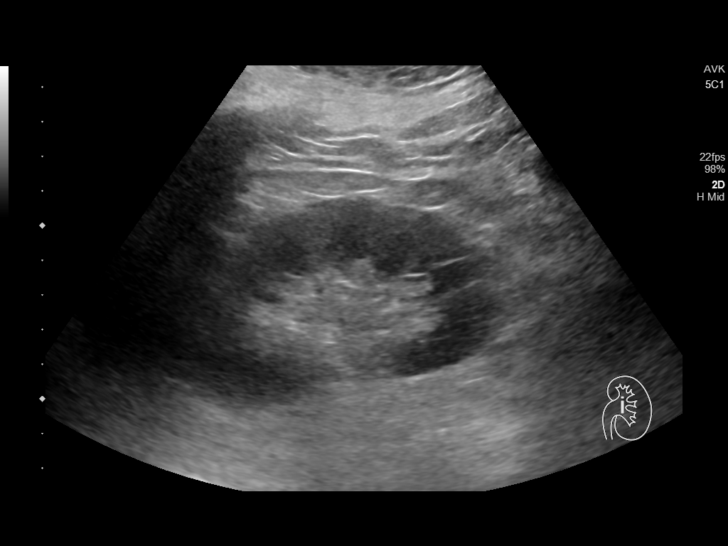
[im 51/68]
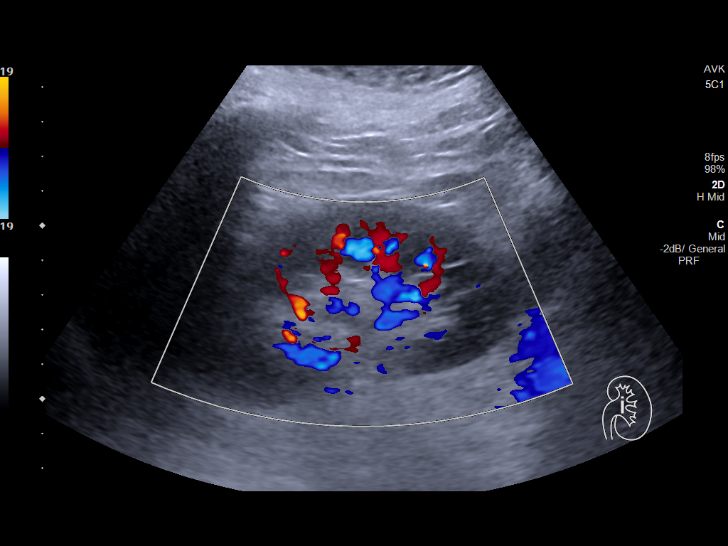
[im 56/68]
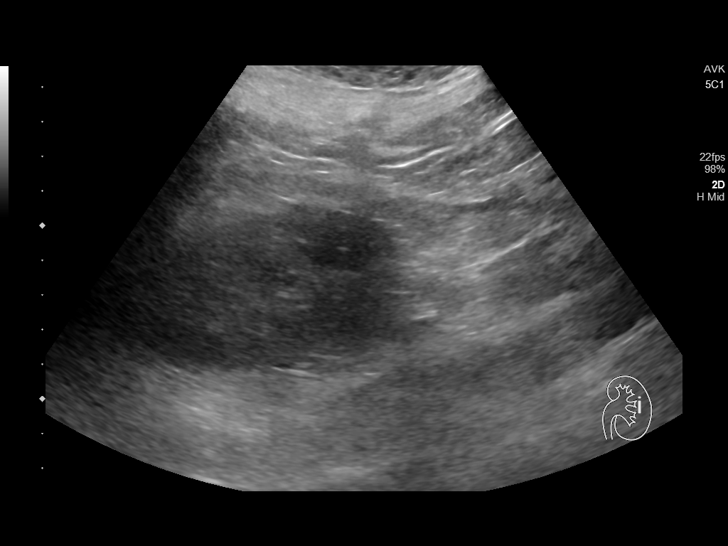
[im 62/68]
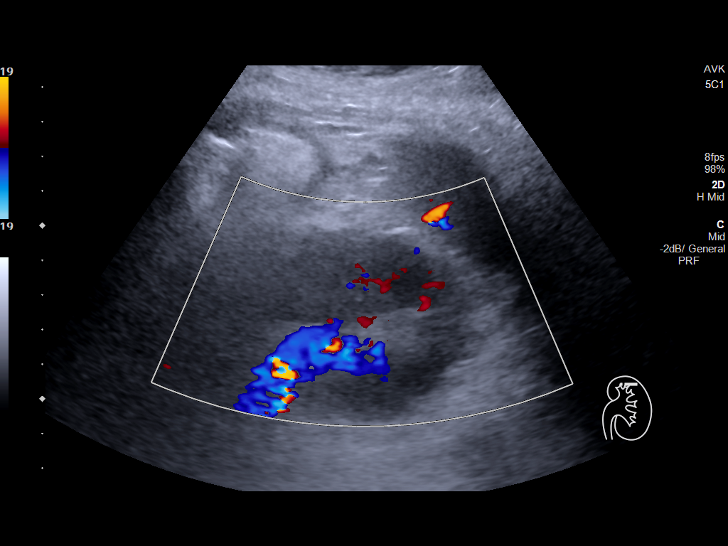
[im 68/68]
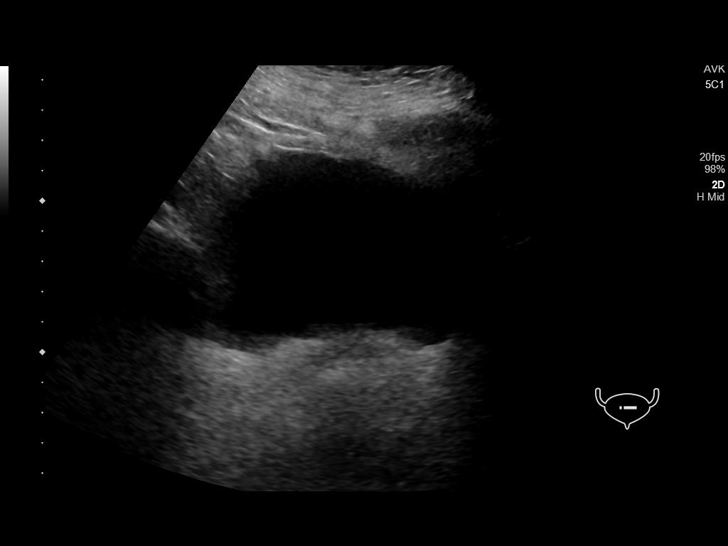

[14 of 25 positions shown; findings below may reference images not displayed]

FINDINGS: Right Kidney:

Renal measurements: 9.8 x 4.5 x 4.7 cm. = volume: 107 mL .
Echogenicity within normal limits. No mass or hydronephrosis
visualized.

Left Kidney:

Renal measurements: 9.7 x 5.1 x 5.6 cm. = volume: 145 mL.
Echogenicity within normal limits. No mass or hydronephrosis
visualized.

Bladder:

Appears normal for degree of bladder distention.

Increased echogenicity of the liver is noted consistent with fatty
infiltration. Minimal free fluid is noted adjacent to the liver.
IMPRESSION: Kidneys are within normal limits.

Fatty infiltration of the liver.

Minimal free fluid in the abdomen.

## 2019-04-17 MED ORDER — PANTOPRAZOLE SODIUM 40 MG IV SOLR
40.0000 mg | Freq: Two times a day (BID) | INTRAVENOUS | Status: DC
  Administered 2019-04-17 – 2019-04-24 (×14): 40 mg via INTRAVENOUS
  Filled 2019-04-17 (×15): qty 40

## 2019-04-17 MED ORDER — SODIUM CHLORIDE 0.9 % IV SOLN
2.0000 g | INTRAVENOUS | Status: DC
  Administered 2019-04-17 – 2019-04-19 (×2): 2 g via INTRAVENOUS
  Filled 2019-04-17 (×2): qty 2
  Filled 2019-04-17: qty 20

## 2019-04-17 MED ORDER — SODIUM CHLORIDE 0.9 % IV BOLUS
1000.0000 mL | Freq: Once | INTRAVENOUS | Status: AC
  Administered 2019-04-17: 1000 mL via INTRAVENOUS

## 2019-04-17 MED ORDER — METRONIDAZOLE IN NACL 5-0.79 MG/ML-% IV SOLN
500.0000 mg | Freq: Three times a day (TID) | INTRAVENOUS | Status: DC
  Administered 2019-04-18 – 2019-04-19 (×5): 500 mg via INTRAVENOUS
  Filled 2019-04-17 (×7): qty 100

## 2019-04-17 MED ORDER — MECLIZINE HCL 12.5 MG PO TABS
12.5000 mg | ORAL_TABLET | Freq: Three times a day (TID) | ORAL | Status: DC | PRN
  Filled 2019-04-17: qty 1

## 2019-04-17 MED ORDER — STERILE WATER FOR INJECTION IJ SOLN
INTRAMUSCULAR | Status: AC
  Administered 2019-04-17: 10 mL
  Filled 2019-04-17: qty 10

## 2019-04-17 MED ORDER — POTASSIUM CHLORIDE CRYS ER 10 MEQ PO TBCR
10.0000 meq | EXTENDED_RELEASE_TABLET | Freq: Once | ORAL | Status: AC
  Administered 2019-04-17: 10 meq via ORAL
  Filled 2019-04-17: qty 1

## 2019-04-17 NOTE — Progress Notes (Signed)
Called by RN that patient had a large bloody bowel movement. Will recheck stat H/H. Stop heparin and place on SCDs. Will plan to transfuse for hemoglobin <7.  Willadean Carol, MD

## 2019-04-17 NOTE — Progress Notes (Signed)
Notified MD of hgb of 7.6. Orders placed. Will continue to monitor and assess.

## 2019-04-17 NOTE — Consult Note (Signed)
[facility] CONSULT NOTE    Date: 04/17/2019                  Patient Name:  Sarah Sullivan  MRN: [id number]  DOB: 1946/03/27  Age / Sex: 73 y.o., female         PCP: Ricardo Jericho, NP                 Service Requesting Consult: Hospitalist                 Reason for Consult: Acute renal failure/CKD stage III            History of Present Illness: Patient is a 73 y.o. female with a PMHx of hypertension, degenerative disc disease, lumbar radiculitis, history of depression, osteopenia, who was admitted to [facility] on 04/14/2019 for evaluation of nausea, vomiting, and dizziness.  She had diminished p.o. intake 2 days prior to admission as well.  Upon presentation she was found to have acute renal failure with a BUN of 27 and creatinine 1.3.  However renal parameters have worsened over the weekend and the BUN is currently 58 with a creatinine of 2.91.  Liver enzymes are also elevated.  Patient is hypotensive with a blood pressure of 82/37.  She has been receiving IV fluid hydration.  At home she was on chlorthalidone, losartan, and meloxicam.  In addition she has now developed a GI bleed.   Medications: Outpatient medications: Medications Prior to Admission  Medication Sig Dispense Refill Last Dose  . aspirin 81 MG chewable tablet Chew 81 mg by mouth daily.   unknown at unknown  . atorvastatin (LIPITOR) 80 MG tablet Take 80 mg by mouth daily.   unknown at unknown  . chlorthalidone (HYGROTON) 25 MG tablet Take 25 mg by mouth daily.   unknown at unknown  . desvenlafaxine (PRISTIQ) 50 MG 24 hr tablet Take 50 mg by mouth daily.   unknown at unknown  . felodipine (PLENDIL) 10 MG 24 hr tablet Take 10 mg by mouth daily.   unknown at unknown  . losartan (COZAAR) 50 MG tablet Take 50 mg by mouth daily.   unknown at unknown  . meloxicam (MOBIC) 15 MG tablet Take 15 mg by mouth daily as needed for pain.    unknown at unknown  . simvastatin (ZOCOR) 20 MG tablet Take 20 mg  by mouth daily.       Current medications: Current Facility-Administered Medications  Medication Dose Route Frequency Provider Last Rate Last Dose  . 0.9 %  sodium chloride infusion   Intravenous Continuous Hillary Bow, MD 75 mL/hr at 04/16/19 0936    . acetaminophen (TYLENOL) tablet 650 mg  650 mg Oral Q6H PRN Saundra Shelling, MD   650 mg at 04/17/19 0212   Or  . acetaminophen (TYLENOL) suppository 650 mg  650 mg Rectal Q6H PRN Saundra Shelling, MD      . aspirin chewable tablet 81 mg  81 mg Oral Daily Pyreddy, Reatha Harps, MD   81 mg at 04/16/19 0933  . atorvastatin (LIPITOR) tablet 80 mg  80 mg Oral Daily Pyreddy, Reatha Harps, MD   80 mg at 04/17/19 0829  . ondansetron (ZOFRAN) tablet 4 mg  4 mg Oral Q6H PRN Saundra Shelling, MD       Or  . ondansetron (ZOFRAN) injection 4 mg  4 mg Intravenous Q6H PRN Saundra Shelling, MD   4 mg at 04/14/19 1708  . piperacillin-tazobactam (ZOSYN) IVPB 3.375 g  3.375 g Intravenous Q12H Charlett Nose, RPH 12.5 mL/hr at 04/17/19 0833 3.375 g at 04/17/19 9417  . senna-docusate (Senokot-S) tablet 1 tablet  1 tablet Oral QHS PRN Pyreddy, Pavan, MD      . sodium chloride 0.9 % bolus 1,000 mL  1,000 mL Intravenous Once Mayo, Pete Pelt, MD      . sodium chloride flush (NS) 0.9 % injection 3 mL  3 mL Intravenous Once Lavonia Drafts, MD      . venlafaxine XR (EFFEXOR-XR) 24 hr capsule 37.5 mg  37.5 mg Oral Q breakfast Pyreddy, Reatha Harps, MD   37.5 mg at 04/17/19 4081      Allergies: No Known Allergies    Past Medical History: Past Medical History:  Diagnosis Date  . Hypertension      Past Surgical History: Past Surgical History:  Procedure Laterality Date  . ABDOMINAL HYSTERECTOMY       Family History: History reviewed. No pertinent family history.   Social History: Social History   Socioeconomic History  . Marital status: Married    Spouse name: Not on file  . Number of children: Not on file  . Years of education: Not on file  . Highest education  level: Not on file  Occupational History  . Not on file  Social Needs  . Financial resource strain: Not on file  . Food insecurity:    Worry: Not on file    Inability: Not on file  . Transportation needs:    Medical: Not on file    Non-medical: Not on file  Tobacco Use  . Smoking status: Never Smoker  . Smokeless tobacco: Never Used  Substance and Sexual Activity  . Alcohol use: Not Currently  . Drug use: Not Currently  . Sexual activity: Not on file  Lifestyle  . Physical activity:    Days per week: Not on file    Minutes per session: Not on file  . Stress: Not on file  Relationships  . Social connections:    Talks on phone: Not on file    Gets together: Not on file    Attends religious service: Not on file    Active member of club or organization: Not on file    Attends meetings of clubs or organizations: Not on file    Relationship status: Not on file  . Intimate partner violence:    Fear of current or ex partner: Not on file    Emotionally abused: Not on file    Physically abused: Not on file    Forced sexual activity: Not on file  Other Topics Concern  . Not on file  Social History Narrative  . Not on file     Review of Systems: Review of Systems  Constitutional: Positive for malaise/fatigue. Negative for chills and fever.  HENT: Negative for congestion, hearing loss and tinnitus.   Eyes: Negative for blurred vision and double vision.  Respiratory: Negative for cough and hemoptysis.   Cardiovascular: Negative for chest pain, palpitations and orthopnea.  Gastrointestinal: Positive for blood in stool, nausea and vomiting. Negative for heartburn.  Genitourinary: Negative for dysuria and urgency.  Musculoskeletal: Positive for joint pain. Negative for myalgias.  Skin: Negative for itching and rash.  Neurological: Positive for dizziness.  Endo/Heme/Allergies: Does not bruise/bleed easily.  Psychiatric/Behavioral: Negative for memory loss. The patient is not  nervous/anxious.     Vital Signs: Blood pressure (!) 79/40, pulse 88, temperature 98 F (36.7 C), temperature source Oral, resp. rate 19,  height 4\' 8"  (1.422 m), weight 55.1 kg, SpO2 97 %.  Weight trends: Filed Weights   04/14/19 1026 04/16/19 0010 04/17/19 0301  Weight: 45.4 kg 51 kg 55.1 kg    Physical Exam: General: NAD, resting in bed  Head: Normocephalic, atraumatic.  Eyes: Anicteric, EOMI  Nose: Mucous membranes moist, not inflammed, nonerythematous.  Throat: Oropharynx nonerythematous, no exudate appreciated.   Neck: Supple, trachea midline.  Lungs:  Normal respiratory effort. Clear to auscultation BL without crackles or wheezes.  Heart: RRR. S1 and S2 normal without gallop, murmur, or rubs.  Abdomen:  BS normoactive. Soft, Nondistended, non-tender.  No masses or organomegaly.  Extremities: No pretibial edema.  Neurologic: A&O X3, Motor strength is 5/5 in the all 4 extremities  Skin: No visible rashes, scars.    Lab results: Basic Metabolic Panel: Recent Labs  Lab 04/14/19 1032 04/14/19 1649  04/15/19 2215 04/16/19 0351 04/17/19 0436  NA 135  --    < > 137 139 136  K 2.2* 2.6*   < > 3.8 3.7 3.4*  CL 97*  --    < > 111 112* 113*  CO2 27  --    < > 18* 17* 16*  GLUCOSE 113*  --    < > 100* 78 118*  BUN 27*  --    < > 32* 31* 58*  CREATININE 1.30*  --    < > 2.28* 2.11* 2.91*  CALCIUM 9.1  --    < > 7.3* 7.3* 6.7*  MG 1.9  1.8  --   --  1.9 2.0  --   PHOS  --  3.0  --   --   --   --    < > = values in this interval not displayed.    Liver Function Tests: Recent Labs  Lab 04/14/19 1032 04/16/19 0351 04/17/19 0436  AST 102* 270* 482*  ALT 78* 176* 281*  ALKPHOS 130* 173* 181*  BILITOT 1.1 2.1* 1.2  PROT 7.4 5.3* 4.2*  ALBUMIN 3.5 2.5* 1.8*   Recent Labs  Lab 04/14/19 1032  LIPASE 26   No results for input(s): AMMONIA in the last 168 hours.  CBC: Recent Labs  Lab 04/14/19 1032 04/15/19 0521 04/16/19 0351 04/17/19 0436 04/17/19 0650  WBC  8.9 8.5 7.7 5.5  --   NEUTROABS 7.6  --  6.3 4.2  --   HGB 11.8* 9.9* 11.6* 7.6* 8.0*  HCT 35.5* 30.7* 36.1 24.9* 24.8*  MCV 93.7 96.2 96.3 101.6*  --   PLT 149* 107* 106* 90*  --     Cardiac Enzymes: Recent Labs  Lab 04/14/19 1032 04/15/19 2215 04/16/19 0351 04/16/19 0946  CKTOTAL  --   --   --  272*  TROPONINI 0.04* <0.03 <0.03 0.03*    BNP: Invalid input(s): POCBNP  CBG: No results for input(s): GLUCAP in the last 168 hours.  Microbiology: Results for orders placed or performed during the hospital encounter of 04/14/19  SARS Coronavirus 2 (CEPHEID - Performed in [facility] hospital lab), Hosp Order     Status: None   Collection Time: 04/14/19 10:32 AM  Result Value Ref Range Status   SARS Coronavirus 2 NEGATIVE NEGATIVE Final    Comment: (NOTE) If result is NEGATIVE SARS-CoV-2 target nucleic acids are NOT DETECTED. The SARS-CoV-2 RNA is generally detectable in upper and lower  respiratory specimens during the acute phase of infection. The lowest  concentration of SARS-CoV-2 viral copies this assay can detect is 250  copies / mL. A negative result does not preclude SARS-CoV-2 infection  and should not be used as the sole basis for treatment or other  patient management decisions.  A negative result may occur with  improper specimen collection / handling, submission of specimen other  than nasopharyngeal swab, presence of viral mutation(s) within the  areas targeted by this assay, and inadequate number of viral copies  (<250 copies / mL). A negative result must be combined with clinical  observations, patient history, and epidemiological information. If result is POSITIVE SARS-CoV-2 target nucleic acids are DETECTED. The SARS-CoV-2 RNA is generally detectable in upper and lower  respiratory specimens dur ing the acute phase of infection.  Positive  results are indicative of active infection with SARS-CoV-2.  Clinical  correlation with patient history and other  diagnostic information is  necessary to determine patient infection status.  Positive results do  not rule out bacterial infection or co-infection with other viruses. If result is PRESUMPTIVE POSTIVE SARS-CoV-2 nucleic acids MAY BE PRESENT.   A presumptive positive result was obtained on the submitted specimen  and confirmed on repeat testing.  While 2019 novel coronavirus  (SARS-CoV-2) nucleic acids may be present in the submitted sample  additional confirmatory testing may be necessary for epidemiological  and / or clinical management purposes  to differentiate between  SARS-CoV-2 and other Sarbecovirus currently known to infect humans.  If clinically indicated additional testing with an alternate test  methodology [phone] is advised. The SARS-CoV-2 RNA is generally  detectable in upper and lower respiratory sp ecimens during the acute  phase of infection. The expected result is Negative. Fact Sheet for Patients:  BoilerBrush.com.cyhttps://www.fda.gov/media/136312/download Fact Sheet for Healthcare Providers: https://pope.com/https://www.fda.gov/media/136313/download This test is not yet approved or cleared by the [place] FDA and has been authorized for detection and/or diagnosis of SARS-CoV-2 by FDA under an Emergency Use Authorization (EUA).  This EUA will remain in effect (meaning this test can be used) for the duration of the COVID-19 declaration under Section 564(b)(1) of the Act, 21 U.S.C. section 360bbb-3(b)(1), unless the authorization is terminated or revoked sooner. Performed at [facility], [address] [place], [place] [address]   MRSA PCR Screening     Status: None   Collection Time: 04/14/19  7:22 PM  Result Value Ref Range Status   MRSA by PCR NEGATIVE NEGATIVE Final    Comment:        The GeneXpert MRSA Assay (FDA approved for NASAL specimens only), is one component of a comprehensive MRSA colonization surveillance program. It is not intended to diagnose MRSA infection  nor to guide or monitor treatment for MRSA infections. Performed at [facility], [address] [place], [place] [address]   CULTURE, BLOOD (ROUTINE X 2) w Reflex to ID Panel     Status: None (Preliminary result)   Collection Time: 04/16/19  7:47 AM  Result Value Ref Range Status   Specimen Description BLOOD BLOOD RIGHT HAND  Final   Special Requests   Final    BOTTLES DRAWN AEROBIC AND ANAEROBIC Blood Culture adequate volume   Culture   Final    NO GROWTH < 24 HOURS Performed at [facility], [address] [place], [place] [address]    Report Status PENDING  Incomplete  CULTURE, BLOOD (ROUTINE X 2) w Reflex to ID Panel     Status: None (Preliminary result)   Collection Time: 04/16/19  9:27 AM  Result Value Ref Range Status   Specimen  Description BLOOD BLOOD RIGHT HAND  Final   Special Requests   Final    BOTTLES DRAWN AEROBIC ONLY Blood Culture adequate volume   Culture   Final    NO GROWTH < 24 HOURS Performed at [facility], [place] [place],  [address]    Report Status PENDING  Incomplete    Coagulation Studies: No results for input(s): LABPROT, INR in the last 72 hours.  Urinalysis: Recent Labs    04/14/19 1922  COLORURINE YELLOW*  LABSPEC 1.014  PHURINE 6.0  GLUCOSEU NEGATIVE  HGBUR MODERATE*  BILIRUBINUR NEGATIVE  KETONESUR 5*  PROTEINUR 100*  NITRITE NEGATIVE  LEUKOCYTESUR NEGATIVE      Imaging:  No results found.   Assessment & Plan: Pt is a 73 y.o. female with a PMHx of hypertension, degenerative disc disease, lumbar radiculitis, history of depression, osteopenia, who was admitted to [facility] on 04/14/2019 for evaluation of nausea, vomiting, and dizziness.   1.  Acute renal failure/chronic kidney disease stage III baseline creatinine 1.0/proteinuria.  Suspect that acute renal failure now is related to acute tubular necrosis from nausea, vomiting, and volume loss from blood loss.  She was also on losartan,  chlorthalidone, and meloxicam at home.  Continue IV fluid hydration.  Consider moving the patient to the critical care unit so that she may receive pressors as her blood pressure is low.  Check renal ultrasound.  In addition we will check ANA, SPEP, UPEP, ANCA antibodies, GBM antibodies, C3, C4.  No urgent indication for dialysis however this may need to be considered if renal function continues to deteriorate.  2.  Hypotension.  Blood pressure noted to be 82/37 when at bedside.  Discussed with Dr. Darvin Neighbours.  Consider transfer to the critical care unit for consideration of pressors.  3.  Anemia blood loss.  Hemoglobin noted to be 8.  Continue to monitor CBC very closely.  4.  Thanks for consultation.

## 2019-04-17 NOTE — Progress Notes (Signed)
[facility] [place] [facility]   PATIENT NAME: Sarah EmeraldLamai Waheed    MR#:  [id number]  DATE OF BIRTH:  [birth year]  SUBJECTIVE:  CHIEF COMPLAINT:   Chief Complaint  Patient presents with  . Nausea  . Emesis   Had a large bloody bowel movement in the morning at 6 AM.  Blood pressure has been running low in systolic of 80s.  Afebrile.  No abdominal pain.  REVIEW OF SYSTEMS:    Review of Systems  Constitutional: Positive for fever and malaise/fatigue. Negative for chills.  HENT: Negative for sore throat.   Eyes: Negative for blurred vision, double vision and pain.  Respiratory: Negative for cough, hemoptysis, shortness of breath and wheezing.   Cardiovascular: Negative for chest pain, palpitations, orthopnea and leg swelling.  Gastrointestinal: Positive for nausea. Negative for abdominal pain, constipation, diarrhea, heartburn and vomiting.  Genitourinary: Negative for dysuria and hematuria.  Musculoskeletal: Negative for back pain and joint pain.  Skin: Negative for rash.  Neurological: Negative for sensory change, speech change, focal weakness and headaches.  Endo/Heme/Allergies: Does not bruise/bleed easily.  Psychiatric/Behavioral: Negative for depression. The patient is not nervous/anxious.     DRUG ALLERGIES:  No Known Allergies  VITALS:  Blood pressure (!) 98/52, pulse 63, temperature 98 F (36.7 C), temperature source Oral, resp. rate 19, height 4\' 8"  (1.422 m), weight 55.1 kg, SpO2 97 %.  PHYSICAL EXAMINATION:   Physical Exam  GENERAL:  73 y.o.-year-old patient lying in the bed . EYES: Pupils equal, round, reactive to light and accommodation. No scleral icterus. Extraocular muscles intact.  HEENT: Head atraumatic, normocephalic. Oropharynx and nasopharynx clear.  NECK:  Supple, no jugular venous distention. No thyroid enlargement, no tenderness.  LUNGS: Normal breath sounds bilaterally, no wheezing, rales, rhonchi. No use of accessory muscles of  respiration.  CARDIOVASCULAR: S1, S2 normal. No murmurs, rubs, or gallops.  ABDOMEN: Soft, nontender, nondistended. Bowel sounds present. No organomegaly or mass.  EXTREMITIES: No cyanosis, clubbing or edema b/l.    NEUROLOGIC: Cranial nerves II through XII are intact. No focal Motor or sensory deficits b/l.   PSYCHIATRIC: The patient is alert and oriented x 3.  SKIN: No obvious rash, lesion, or ulcer.   LABORATORY PANEL:   CBC Recent Labs  Lab 04/17/19 0436 04/17/19 0650  WBC 5.5  --   HGB 7.6* 8.0*  HCT 24.9* 24.8*  PLT 90*  --    ------------------------------------------------------------------------------------------------------------------ Chemistries  Recent Labs  Lab 04/16/19 0351 04/17/19 0436  NA 139 136  K 3.7 3.4*  CL 112* 113*  CO2 17* 16*  GLUCOSE 78 118*  BUN 31* 58*  CREATININE 2.11* 2.91*  CALCIUM 7.3* 6.7*  MG 2.0  --   AST 270* 482*  ALT 176* 281*  ALKPHOS 173* 181*  BILITOT 2.1* 1.2   ------------------------------------------------------------------------------------------------------------------  Cardiac Enzymes Recent Labs  Lab 04/16/19 0946  TROPONINI 0.03*   ------------------------------------------------------------------------------------------------------------------  RADIOLOGY:  Ct Abdomen Pelvis Wo Contrast  Result Date: 04/15/2019 CLINICAL DATA:  Nausea and vomiting, history of hypertension, dizziness. EXAM: CT ABDOMEN AND PELVIS WITHOUT CONTRAST TECHNIQUE: Multidetector CT imaging of the abdomen and pelvis was performed following the standard protocol without IV contrast. COMPARISON:  None. FINDINGS: Lower chest: No acute abnormality. Hepatobiliary: No focal liver abnormality is seen. No gallstones, gallbladder wall thickening, or biliary dilatation. Pancreas: Unremarkable. No pancreatic ductal dilatation or surrounding inflammatory changes. Spleen: Normal in size without focal abnormality. Adrenals/Urinary Tract: Adrenal glands  appear normal. Kidneys are unremarkable without mass,  stone or hydronephrosis. No perinephric fluid. No ureteral or bladder calculi identified. Bladder appears normal, partially decompressed. Stomach/Bowel: No dilated large or small bowel loops. Questionable thickening of the walls of the RIGHT colon and transverse colon with mild pericolonic inflammation suggesting acute colitis. Appendix is normal. Stomach is unremarkable, partially decompressed. Vascular/Lymphatic: Aortic atherosclerosis. No enlarged lymph nodes seen. Reproductive: Presumed hysterectomy. No adnexal mass or free fluid seen. Other: No free fluid or abscess collection. No free intraperitoneal air. Musculoskeletal: No acute or suspicious osseous finding. Fixation hardware at the L3 through L5 levels appears intact and appropriately position. IMPRESSION: 1. Suspected mild colitis of the transverse colon and RIGHT colon. 2. Remainder of the abdomen and pelvis CT is unremarkable. No bowel obstruction. No free fluid or abscess collection. No evidence of acute solid organ abnormality. Appendix is normal. Aortic Atherosclerosis (ICD10-I70.0). Electronically Signed   By: Bary Richard M.D.   On: 04/15/2019 11:53     ASSESSMENT AND PLAN:   *Acute lower GI bleed secondary to colitis. With acute blood loss anemia with hemoglobin dropping from 11-8.  We will bolus normal saline stat.  Make her n.p.o.  Discussed with Dr. Allegra Lai of GI.  DVT prophylaxis with heparin stopped. Check PT/INR Type and cross and hold 2 units We will transfer to ICU.  Discussed with Dr. Jayme Cloud.  Critically ill.  *Acute colitis with sepsis  IVF IV zosyn Blood Cx pending  * Afib RVR due to sepsis Now in NSR. No need for anti coagulation Appreciate cardiology input  *Acute kidney injury likely due to ATN.  Patient was also on meloxicam.  Stopped. Continue IV fluids Monitor UOP Discussed with nephrology Dr. Lourdes Sledge  *Hypertension.  Hold losartan and felodipine due  to hypotension  *Mild thrombocytopenia likely due to sepsis.  DVT prophylaxis with heparin  All the records are reviewed and case discussed with Care Management/Social Worker. Management plans discussed with the patient, family and they are in agreement.  CODE STATUS: Full code  DVT Prophylaxis: SCDs  TOTAL CRITICAL CARE TIME TAKING CARE OF THIS PATIENT: 35 minutes.   POSSIBLE D/C IN 1-2 DAYS, DEPENDING ON CLINICAL CONDITION.  Molinda Bailiff Sumer [place] M.D on 04/17/2019 at 11:27 AM  Between 7am to 6pm - Pager - [phone]  After 6pm go to [email or web address] - password [email or web address] [facility]  [facility]  Office  [phone]  CC: Primary care physician; White, Arlyss Repress, NP  Note: This dictation was prepared with Dragon dictation along with smaller phrase technology. Any transcriptional errors that result from this process are unintentional.

## 2019-04-17 NOTE — Consult Note (Signed)
Cephas Darby, MD [address]  [place]  [place], [place] [address]  Main: [phone]  Fax: [phone] Pager: [phone]   Consultation  Referring Provider:     No ref. provider found Primary Care Physician:  Ricardo Jericho, NP Primary Gastroenterologist: None       Reason for Consultation:     Colitis, elevated LFTs  Date of Admission:  04/14/2019 Date of Consultation:  04/17/2019         HPI:   Sarah Sullivan is a 73 y.o. female with history of hypertension who was admitted to [facility] on 04/14/2019 secondary to nausea, vomiting, nonbloody diarrhea, abdominal pain.  She was found to have severe hypokalemia, AKI.  CT abdomen on admission revealed right-sided colon wall thickening.  She was empirically started on antibiotics. She was initially admitted under observation however, her renal function continued to decline.  This morning, she was hypotensive, had 1 large episode of very dark/black bloody bowel movement.  Her hemoglobin dropped from 11.8 on admission to 8 today.  She also developed new onset of severe thrombocytopenia. Therefore, she is transferred to ICU.  Her hypotension improved with IV fluids.  She did not require any pressors.  She is also found to have mildly elevated transaminases on admission which have been worsening to date.  CT did not reveal any hepatopancreaticobiliary pathology.  Lipase was normal.  She does not have leukocytosis.  UA revealed proteinuria.  Followed by nephrology.  COVID-19 test was negative  Patient was in normal state of health last week.  She reports that she generally eats outside with her husband as her husband does not prefer her to cook at home.  She denies eating any undercooked beef or pork, seafood.  She takes meloxicam and Tylenol regularly for chronic back pain.  She reports having had regular, formed, brown bowel movements prior to this episode.  She denies fevers, chills.  She denies weight loss.  She was admitted to  [facility] in 2017 with similar presentation, at that time CT abdomen revealed mild right-sided colitis which was thought to be infectious or ischemic.  Colonoscopy was not pursued as she had 1 at [facility] in 2016 and reportedly normal.  IBD was thought to be unlikely at that time.  She denies alcohol use, IV drug abuse, smoking.  She is originally from [place], her husband is Optometrist.  Moved to [place] several years ago, her last visit to [place] was 4 years ago.  When I interviewed her in the ICU, she complained of upper and right-sided abdominal pain, nausea, loss of appetite.  She reports that she had a large black bowel movement after she had the oral contrast for CT scan from 2 days ago.  She did not have any abdominal surgeries  NSAIDs: Meloxicam regularly for back pain  Antiplts/Anticoagulants/Anti thrombotics: None  GI Procedures: Colonoscopy in 2016 at [facility], reportedly normal She denies family history of liver condition, GI malignancy  Past Medical History:  Diagnosis Date   Hypertension     Past Surgical History:  Procedure Laterality Date   ABDOMINAL HYSTERECTOMY      Prior to Admission medications   Medication Sig Start Date End Date Taking? Authorizing Provider  aspirin 81 MG chewable tablet Chew 81 mg by mouth daily.   Yes [provider]  atorvastatin (LIPITOR) 80 MG tablet Take 80 mg by mouth daily.   Yes [provider]  chlorthalidone (HYGROTON) 25 MG tablet Take 25 mg by  mouth daily.   Yes [provider]  desvenlafaxine (PRISTIQ) 50 MG 24 hr tablet Take 50 mg by mouth daily.   Yes [provider]  felodipine (PLENDIL) 10 MG 24 hr tablet Take 10 mg by mouth daily.   Yes [provider]  losartan (COZAAR) 50 MG tablet Take 50 mg by mouth daily.   Yes [provider]  meloxicam (MOBIC) 15 MG tablet Take 15 mg by mouth daily as needed for pain.    Yes [provider]  simvastatin (ZOCOR) 20 MG tablet Take  20 mg by mouth daily.    [provider]    Current Facility-Administered Medications:    0.9 %  sodium chloride infusion, , Intravenous, Continuous, Tyler Pita, MD, Last Rate: 125 mL/hr at 04/17/19 1345   acetaminophen (TYLENOL) tablet 650 mg, 650 mg, Oral, Q6H PRN, 650 mg at 04/17/19 0212 **OR** acetaminophen (TYLENOL) suppository 650 mg, 650 mg, Rectal, Q6H PRN, Pyreddy, Pavan, MD   aspirin chewable tablet 81 mg, 81 mg, Oral, Daily, Pyreddy, Pavan, MD, 81 mg at 04/16/19 0933   atorvastatin (LIPITOR) tablet 80 mg, 80 mg, Oral, Daily, Pyreddy, Pavan, MD, 80 mg at 04/17/19 0829   ondansetron (ZOFRAN) tablet 4 mg, 4 mg, Oral, Q6H PRN **OR** ondansetron (ZOFRAN) injection 4 mg, 4 mg, Intravenous, Q6H PRN, Pyreddy, Pavan, MD, 4 mg at 04/17/19 1350   piperacillin-tazobactam (ZOSYN) IVPB 3.375 g, 3.375 g, Intravenous, Q12H, Charlett Nose, RPH, Last Rate: 12.5 mL/hr at 04/17/19 0833, 3.375 g at 04/17/19 4098   senna-docusate (Senokot-S) tablet 1 tablet, 1 tablet, Oral, QHS PRN, Pyreddy, Pavan, MD   sodium chloride 0.9 % bolus 1,000 mL, 1,000 mL, Intravenous, Once, Mayo, Pete Pelt, MD   sodium chloride flush (NS) 0.9 % injection 3 mL, 3 mL, Intravenous, Once, Lavonia Drafts, MD   venlafaxine XR (EFFEXOR-XR) 24 hr capsule 37.5 mg, 37.5 mg, Oral, Q breakfast, Pyreddy, Pavan, MD, 37.5 mg at 04/17/19 1191  History reviewed. No pertinent family history.   Social History   Tobacco Use   Smoking status: Never Smoker   Smokeless tobacco: Never Used  Substance Use Topics   Alcohol use: Not Currently   Drug use: Not Currently    Allergies as of 04/14/2019   (No Known Allergies)    Review of Systems:    All systems reviewed and negative except where noted in HPI.   Physical Exam:  Vital signs in last 24 hours: Temp:  [98 F (36.7 C)-98.4 F (36.9 C)] 98 F (36.7 C) (05/25 1246) Pulse Rate:  [56-103] 74 (05/25 1300) Resp:  [16-19] 16 (05/25 1300) BP:  (76-106)/(32-62) 93/45 (05/25 1300) SpO2:  [90 %-100 %] 100 % (05/25 1300) Weight:  [55.1 kg-57.4 kg] 57.4 kg (05/25 1246) Last BM Date: 04/17/19 General:   Pleasant, cooperative in NAD Head:  Normocephalic and atraumatic. Eyes:   No icterus.   Conjunctiva pink. PERRLA. Ears:  Normal auditory acuity. Neck:  Supple; no masses or thyroidomegaly Lungs: Respirations even and unlabored. Lungs clear to auscultation bilaterally.   No wheezes, crackles, or rhonchi.  Heart:  Regular rate and rhythm;  Without murmur, clicks, rubs or gallops Abdomen:  Soft, nondistended, mild epigastric and right upper and lower abdominal tenderness. Normal bowel sounds. No appreciable masses or hepatomegaly.  No rebound or guarding.  Rectal:  Not performed. Msk:  Symmetrical without gross deformities.  Strength generalized weakness Extremities:  Without edema, cyanosis or clubbing. Neurologic:  Alert and oriented x3;  grossly normal  neurologically. Skin:  Intact without significant lesions or rashes. Cervical Nodes:  No significant cervical adenopathy. Psych:  Alert and cooperative. Normal affect.  LAB RESULTS: CBC Latest Ref Rng & Units 04/17/2019 04/17/2019 04/17/2019  WBC 4.0 - 10.5 K/uL - - 5.5  Hemoglobin 12.0 - 15.0 g/dL 8.0(L) 8.0(L) 7.6(L)  Hematocrit 36.0 - 46.0 % 25.2(L) 24.8(L) 24.9(L)  Platelets 150 - 400 K/uL - - 90(L)    BMET BMP Latest Ref Rng & Units 04/17/2019 04/16/2019 04/15/2019  Glucose 70 - 99 mg/dL 118(H) 78 100(H)  BUN 8 - 23 mg/dL 58(H) 31(H) 32(H)  Creatinine 0.44 - 1.00 mg/dL 2.91(H) 2.11(H) 2.28(H)  Sodium 135 - 145 mmol/L 136 139 137  Potassium 3.5 - 5.1 mmol/L 3.4(L) 3.7 3.8  Chloride 98 - 111 mmol/L 113(H) 112(H) 111  CO2 22 - 32 mmol/L 16(L) 17(L) 18(L)  Calcium 8.9 - 10.3 mg/dL 6.7(L) 7.3(L) 7.3(L)    LFT Hepatic Function Latest Ref Rng & Units 04/17/2019 04/16/2019 04/14/2019  Total Protein 6.5 - 8.1 g/dL 4.2(L) 5.3(L) 7.4  Albumin 3.5 - 5.0 g/dL 1.8(L) 2.5(L) 3.5  AST 15 -  41 U/L 482(H) 270(H) 102(H)  ALT 0 - 44 U/L 281(H) 176(H) 78(H)  Alk Phosphatase 38 - 126 U/L 181(H) 173(H) 130(H)  Total Bilirubin 0.3 - 1.2 mg/dL 1.2 2.1(H) 1.1     STUDIES: No results found.    Impression / Plan:   Sarah Sullivan is a 73 y.o. female with history of hypertension, chronic back pain, long-term NSAID use admitted with nausea, vomiting and diarrhea complicated by hypotension, AKI, elevated LFTs, right-sided colitis  Right-sided colitis with hematochezia Infectious or ischemic or inflammatory etiology Patient had similar episode in 2017 Recommend stool studies to rule out C. difficile Okay with clear liquid diet Monitor CBC closely and transfuse to maintain hemoglobin >7 She will benefit from endoscopic evaluation after improvement in her clinical condition prior to discharge  Epigastric pain Start Protonix 40 mg IV twice daily Avoid NSAIDs Recommend EGD along with colonoscopy to rule out peptic ulcer disease, H. pylori gastritis  Thrombocytopenia, new onset Patient does not have splenomegaly or signs of chronic liver disease Most likely secondary to acute blood loss If persistent, recommend hematology evaluation  Elevated LFTs, predominantly transaminases She does not have liver failure Most likely secondary to shock liver due to hypotension Patient had normal LFTs prior to admission No evidence of biliary obstruction on the CT If LFTs are not improving, will evaluate for viral etiology Acute viral hepatitis panel is in process Monitor daily LFTs  AKI, non-oliguric Nephrology is following  Thank you for involving me in the care of this patient.  Dr. Vicente Males to cover from tomorrow    LOS: 1 day   Sherri Sear, MD  04/17/2019, 2:08 PM   Note: This dictation was prepared with Dragon dictation along with smaller phrase technology. Any transcriptional errors that result from this process are unintentional.

## 2019-04-17 NOTE — Consult Note (Addendum)
Reason for Consult:Assistance with management of hypotension Referring Physician:Sudini, Srikar MD  Sarah Sullivan is an 73 y.o. female.  HPI: Patient is a 73 year old lifelong never smoker, with a prior episode of infectious colitis in 2017, who has been transferred to the ICU/stepdown due to episode of bloody diarrhea and decrease in H&H.  Patient is also noted to be hypotensive.  Hemoglobin has dropped from 11.8 on admission (5/22) to 8.0 today.  Prior to admission the patient presented with nausea vomiting decreased p.o. intake and nonbloody diarrhea.  She also has abdominal pain which is diffuse but somewhat localized now to the epigastric area.  She developed issues as above today where she developed a large episode of dark bloody bowel movement.  Currently she is responding to IV fluids but may require pressors.  She is currently in the stepdown/ICU area.  She does not have any new abdominal complaint other than those presenting above.  She does complain bitterly of dizziness and fear somewhat anxious.  Patient also was noted to have acute kidney injury.  We are asked to assist with critical care issues regarding her hypotension. Past Medical History:  Diagnosis Date  . Hypertension     Past Surgical History:  Procedure Laterality Date  . ABDOMINAL HYSTERECTOMY      History reviewed. No pertinent family history.  Social History:  reports that she has never smoked. She has never used smokeless tobacco. She reports previous alcohol use. She reports previous drug use.  She had moved to [place] in the latter part of 2017, living in [place] until 3 months ago.   Allergies: No Known Allergies  Medications: I have reviewed the patient's current medications.  Results for orders placed or performed during the hospital encounter of 04/14/19 (from the past 48 hour(s))  Magnesium     Status: None   Collection Time: 04/16/19  3:51 AM  Result Value Ref Range   Magnesium 2.0 1.7 - 2.4 mg/dL     Comment: Performed at [facility], [address] [place], [place] [address]  CBC with Differential/Platelet     Status: Abnormal   Collection Time: 04/16/19  3:51 AM  Result Value Ref Range   WBC 7.7 4.0 - 10.5 K/uL   RBC 3.75 (L) 3.87 - 5.11 MIL/uL   Hemoglobin 11.6 (L) 12.0 - 15.0 g/dL   HCT 63.8 93.7 - 34.2 %   MCV 96.3 80.0 - 100.0 fL   MCH 30.9 26.0 - 34.0 pg   MCHC 32.1 30.0 - 36.0 g/dL   RDW 87.6 81.1 - 57.2 %   Platelets 106 (L) 150 - 400 K/uL    Comment: Immature Platelet Fraction may be clinically indicated, consider ordering this additional test [id number]    nRBC 0.0 0.0 - 0.2 %   Neutrophils Relative % 82 %   Neutro Abs 6.3 1.7 - 7.7 K/uL   Lymphocytes Relative 8 %   Lymphs Abs 0.6 (L) 0.7 - 4.0 K/uL   Monocytes Relative 4 %   Monocytes Absolute 0.3 0.1 - 1.0 K/uL   Eosinophils Relative 5 %   Eosinophils Absolute 0.4 0.0 - 0.5 K/uL   Basophils Relative 0 %   Basophils Absolute 0.0 0.0 - 0.1 K/uL   WBC Morphology MILD LEFT SHIFT (1-5% METAS, OCC MYELO, OCC BANDS)    RBC Morphology MORPHOLOGY UNREMARKABLE    Smear Review Normal platelet morphology    Immature Granulocytes 1 %   Abs Immature Granulocytes 0.07 0.00 - 0.07 K/uL  Comment: Performed at [facility], [address] [place], [place] [address]  Comprehensive metabolic panel     Status: Abnormal   Collection Time: 04/16/19  3:51 AM  Result Value Ref Range   Sodium 139 135 - 145 mmol/L   Potassium 3.7 3.5 - 5.1 mmol/L   Chloride 112 (H) 98 - 111 mmol/L   CO2 17 (L) 22 - 32 mmol/L   Glucose, Bld 78 70 - 99 mg/dL   BUN 31 (H) 8 - 23 mg/dL   Creatinine, Ser 6.042.11 (H) 0.44 - 1.00 mg/dL   Calcium 7.3 (L) 8.9 - 10.3 mg/dL   Total Protein 5.3 (L) 6.5 - 8.1 g/dL   Albumin 2.5 (L) 3.5 - 5.0 g/dL   AST 540270 (H) 15 - 41 U/L   ALT 176 (H) 0 - 44 U/L   Alkaline Phosphatase 173 (H) 38 - 126 U/L   Total Bilirubin 2.1 (H) 0.3 - 1.2 mg/dL   GFR calc non Af Amer 23 (L) >60 mL/min   GFR calc  Af Amer 26 (L) >60 mL/min   Anion gap 10 5 - 15    Comment: Performed at [facility], [address] [place], [place] [address]  Troponin I - Now Then Q6H     Status: None   Collection Time: 04/16/19  3:51 AM  Result Value Ref Range   Troponin I <0.03 <0.03 ng/mL    Comment: Performed at [facility], [address] [place], [place] [address]  CULTURE, BLOOD (ROUTINE X 2) w Reflex to ID Panel     Status: None (Preliminary result)   Collection Time: 04/16/19  7:47 AM  Result Value Ref Range   Specimen Description BLOOD BLOOD RIGHT HAND    Special Requests      BOTTLES DRAWN AEROBIC AND ANAEROBIC Blood Culture adequate volume   Culture      NO GROWTH < 24 HOURS Performed at [facility], [address] [place], [place] [address]    Report Status PENDING   CULTURE, BLOOD (ROUTINE X 2) w Reflex to ID Panel     Status: None (Preliminary result)   Collection Time: 04/16/19  9:27 AM  Result Value Ref Range   Specimen Description BLOOD BLOOD RIGHT HAND    Special Requests      BOTTLES DRAWN AEROBIC ONLY Blood Culture adequate volume   Culture      NO GROWTH < 24 HOURS Performed at [facility], [address] [place], [place] [address]    Report Status PENDING   Troponin I - Now Then Q6H     Status: Abnormal   Collection Time: 04/16/19  9:46 AM  Result Value Ref Range   Troponin I 0.03 (HH) <0.03 ng/mL    Comment: CRITICAL RESULT CALLED TO, READ BACK BY AND VERIFIED WITH [facility] ON 04/16/2019 AT 1045 QSD Performed at [facility], [address] [place], [place] [address]   CK     Status: Abnormal   Collection Time: 04/16/19  9:46 AM  Result Value Ref Range   Total CK 272 (H) 38 - 234 U/L    Comment: Performed at [facility], [address] [place], [place] [address]  Comprehensive metabolic panel     Status: Abnormal   Collection Time: 04/17/19  4:36 AM  Result Value Ref Range   Sodium 136 135 - 145 mmol/L    Potassium 3.4 (L) 3.5 - 5.1 mmol/L   Chloride 113 (  H) 98 - 111 mmol/L   CO2 16 (L) 22 - 32 mmol/L   Glucose, Bld 118 (H) 70 - 99 mg/dL   BUN 58 (H) 8 - 23 mg/dL   Creatinine, Ser 6.96 (H) 0.44 - 1.00 mg/dL   Calcium 6.7 (L) 8.9 - 10.3 mg/dL   Total Protein 4.2 (L) 6.5 - 8.1 g/dL   Albumin 1.8 (L) 3.5 - 5.0 g/dL   AST 295 (H) 15 - 41 U/L   ALT 281 (H) 0 - 44 U/L   Alkaline Phosphatase 181 (H) 38 - 126 U/L   Total Bilirubin 1.2 0.3 - 1.2 mg/dL   GFR calc non Af Amer 15 (L) >60 mL/min   GFR calc Af Amer 18 (L) >60 mL/min   Anion gap 7 5 - 15    Comment: Performed at [facility], [address] [place], [place] [address]  CBC with Differential/Platelet     Status: Abnormal   Collection Time: 04/17/19  4:36 AM  Result Value Ref Range   WBC 5.5 4.0 - 10.5 K/uL   RBC 2.45 (L) 3.87 - 5.11 MIL/uL   Hemoglobin 7.6 (L) 12.0 - 15.0 g/dL   HCT 24.4 (L) 01.0 - 27.2 %   MCV 101.6 (H) 80.0 - 100.0 fL   MCH 31.0 26.0 - 34.0 pg   MCHC 30.5 30.0 - 36.0 g/dL   RDW 53.6 64.4 - 03.4 %   Platelets 90 (L) 150 - 400 K/uL    Comment: Immature Platelet Fraction may be clinically indicated, consider ordering this additional test [id number]    nRBC 0.0 0.0 - 0.2 %   Neutrophils Relative % 77 %   Neutro Abs 4.2 1.7 - 7.7 K/uL   Lymphocytes Relative 10 %   Lymphs Abs 0.6 (L) 0.7 - 4.0 K/uL   Monocytes Relative 4 %   Monocytes Absolute 0.2 0.1 - 1.0 K/uL   Eosinophils Relative 8 %   Eosinophils Absolute 0.5 0.0 - 0.5 K/uL   Basophils Relative 0 %   Basophils Absolute 0.0 0.0 - 0.1 K/uL   Immature Granulocytes 1 %   Abs Immature Granulocytes 0.03 0.00 - 0.07 K/uL   Burr Cells PRESENT     Comment: Performed at [facility], [address] [place], [place] [address]  Hemoglobin and hematocrit, blood     Status: Abnormal   Collection Time: 04/17/19  6:50 AM  Result Value Ref Range   Hemoglobin 8.0 (L) 12.0 - 15.0 g/dL   HCT 33.2 (L) 95.1 - 88.4 %    Comment: Performed at  [facility], [address] [place], [place] [address]  Hemoglobin and hematocrit, blood     Status: Abnormal   Collection Time: 04/17/19 12:06 PM  Result Value Ref Range   Hemoglobin 8.0 (L) 12.0 - 15.0 g/dL   HCT 30.1 (L) 60.1 - 09.3 %    Comment: Performed at [facility], [address] [place], [place] [address]  Protime-INR     Status: None   Collection Time: 04/17/19 12:06 PM  Result Value Ref Range   Prothrombin Time 13.1 11.4 - 15.2 seconds   INR 1.0 0.8 - 1.2    Comment: (NOTE) INR goal varies based on device and disease states. Performed at [facility], [address] [place], [place] [address]   Type and screen Hold 2 units now and stay ahead 2 units at all times     Status: None   Collection Time: 04/17/19  12:06 PM  Result Value Ref Range   ABO/RH(D) B POS    Antibody Screen NEG    Sample Expiration      04/20/2019,2359 Performed at [facility], [address] [place], [place] [address]   MRSA PCR Screening     Status: None   Collection Time: 04/17/19  1:47 PM  Result Value Ref Range   MRSA by PCR NEGATIVE NEGATIVE    Comment:        The GeneXpert MRSA Assay (FDA approved for NASAL specimens only), is one component of a comprehensive MRSA colonization surveillance program. It is not intended to diagnose MRSA infection nor to guide or monitor treatment for MRSA infections. Performed at [facility], [address] [place], [place] [address]   Ferritin     Status: Abnormal   Collection Time: 04/17/19  2:17 PM  Result Value Ref Range   Ferritin 1,380 (H) 11 - 307 ng/mL    Comment: Performed at [facility], [address] [place], [place] [address]  Iron and TIBC     Status: Abnormal   Collection Time: 04/17/19  2:17 PM  Result Value Ref Range   Iron 20 (L) 28 - 170 ug/dL   TIBC 213 (L) 086 - 578 ug/dL   Saturation Ratios 14 10.4 - 31.8 %   UIBC 122 ug/dL    Comment: Performed at  [facility], [address] [place], [place] [address]  Vitamin B12     Status: None   Collection Time: 04/17/19  2:17 PM  Result Value Ref Range   Vitamin B-12 783 180 - 914 pg/mL    Comment: (NOTE) This assay is not validated for testing neonatal or myeloproliferative syndrome specimens for Vitamin B12 levels. Performed at [facility], [address] [place], [place] [address]   Folate     Status: None   Collection Time: 04/17/19  2:17 PM  Result Value Ref Range   Folate 18.3 >5.9 ng/mL    Comment: Performed at [facility], [address] [place], [place] [address]  Platelet count     Status: Abnormal   Collection Time: 04/17/19  2:56 PM  Result Value Ref Range   Platelets 84 (L) 150 - 400 K/uL    Comment: Immature Platelet Fraction may be clinically indicated, consider ordering this additional test [id number] Performed at [facility], [address] [place], [place] [address]   Hemoglobin and hematocrit, blood     Status: Abnormal   Collection Time: 04/17/19  6:46 PM  Result Value Ref Range   Hemoglobin 7.3 (L) 12.0 - 15.0 g/dL   HCT 34.7 (L) 42.5 - 95.6 %    Comment: Performed at [facility], [address] [place], [place] [address]    US Renal  Result Date: 04/17/2019 CLINICAL DATA:  Acute renal failure EXAM: RENAL / URINARY TRACT ULTRASOUND COMPLETE COMPARISON:  CT from 04/15/2019 FINDINGS: Right Kidney: Renal measurements: 9.8 x 4.5 x 4.7 cm. = volume: 107 mL . Echogenicity within normal limits. No mass or hydronephrosis visualized. Left Kidney: Renal measurements: 9.7 x 5.1 x 5.6 cm. = volume: 145 mL. Echogenicity within normal limits. No mass or hydronephrosis visualized. Bladder: Appears normal for degree of bladder distention. Increased echogenicity of the liver is noted consistent with fatty infiltration. Minimal free fluid is noted adjacent to the liver. IMPRESSION: Kidneys are within normal limits. Fatty  infiltration of the liver. Minimal free fluid in the abdomen.  Electronically Signed   By: Alcide Clever M.D.   On: 04/17/2019 21:08    Review of Systems  Constitutional: Positive for malaise/fatigue.  HENT: Positive for tinnitus.   Eyes: Negative.   Respiratory: Negative.   Cardiovascular: Positive for palpitations.  Gastrointestinal: Positive for abdominal pain, blood in stool, diarrhea, nausea and vomiting.  Genitourinary: Negative.   Musculoskeletal: Positive for myalgias.  Skin: Negative.   Neurological: Positive for dizziness.  Endo/Heme/Allergies: Negative.   Psychiatric/Behavioral: Negative.   All other systems reviewed and are negative.  Blood pressure (!) 85/67, pulse 78, temperature 98 F (36.7 C), temperature source Oral, resp. rate (!) 24, height  (1.499 m), weight 57.4 kg, SpO2 93 %. Physical Exam  Nursing note and vitals reviewed. Constitutional: She is oriented to person, place, and time. She appears well-developed and well-nourished.  Looks uncomfortable  HENT:  Head: Normocephalic and atraumatic.  Right Ear: External ear normal.  Left Ear: External ear normal.  Nose: Nose normal.  Mouth/Throat: Mucous membranes are pale and dry.  Cardiovascular: Normal rate, regular rhythm, normal heart sounds and intact distal pulses.  No murmur heard. Respiratory: Effort normal and breath sounds normal. No respiratory distress.  GI: Soft. Bowel sounds are decreased. There is abdominal tenderness. There is guarding. There is no rebound.  Musculoskeletal: Normal range of motion.        General: No deformity or edema.  Neurological: She is alert and oriented to person, place, and time.  Skin: Skin is warm and dry.  Multiple tattoos.  Psychiatric: Her speech is normal and behavior is normal. Her mood appears anxious.    Assessment/Plan:  1. Hypotension due to hypovolemia, acute blood loss anemia: Continue volume resuscitation.  Transfuse for hemoglobin less than 7.  She  will require pressors if she does not respond to adequate volume resuscitation.  Currently she is responding well to this.  2. Colitis with hematochezia: She has been evaluated by GI, appreciate input.  C. difficile assay pending.  We will also request GI panel assay.  I discussed informally with infectious disease who recommended antibiotics be Rocephin plus Flagyl.  Will avoid quinolones due to her acute kidney injury.  Continue supportive care.  3. Anemia of acute blood loss: Monitor H&H transfuse for hemoglobin less than 7.  4. AKI: This is likely related to hypovolemia/ATN, avoid nephrotoxins.  Renal has been consulted appreciate input.  Monitor BUN and creatinine.  4. Thrombocytopenia: Likely due to acute critical illness and significant blood loss.  Monitor.  Use SCDs for DVT prophylaxis.  Thrombocytopenia is modest.  5. Transaminitis: Could be due to mild shock liver.  Monitor.  Supportive care.  Thank you for allowing [facility] to participate in this patient's care.  We will continue to follow along with you.    Gailen Shelter, MD [facility] 04/17/2019, 10:47 PM

## 2019-04-17 NOTE — Progress Notes (Signed)
Patient's blood pressure in the 70s and 80s systolic and MD ordered a transfer to critical care unit. 1000 ml bolus given, report called to Billey Gosling, RN and patient transferred to CCU. Last bp before transfer at 1225 was 87/46, pulse 71.

## 2019-04-17 NOTE — Progress Notes (Signed)
PT Cancellation Note  Patient Details Name: Frimet Fullilove MRN: [id number] DOB: 11/04/1946   Cancelled Treatment:    Reason Eval/Treat Not Completed: Medical issues which prohibited therapy(Per chart review,patient noted with transfer to CCU due to GI bleed, hypotension.  Per guidelines, will require new orders to resume services.  Please re-consult as medically appropriate.)   Antonius Hartlage H. Manson Passey, PT, DPT, NCS 04/17/19, 10:15 PM [phone]

## 2019-04-17 NOTE — Progress Notes (Signed)
Pharmacy Electrolyte Monitoring Consult:  Pharmacy consulted to assist in monitoring and replacing electrolytes in this 73 y.o. female admitted on 04/14/2019 with Nausea and Emesis   Labs:  Sodium (mmol/L)  Date Value  04/17/2019 136  12/25/2014 141   Potassium (mmol/L)  Date Value  04/17/2019 3.4 (L)  12/25/2014 3.5   Magnesium (mg/dL)  Date Value  59/45/8592 2.0   Phosphorus (mg/dL)  Date Value  92/44/6286 3.0   Calcium (mg/dL)  Date Value  38/17/7116 6.7 (L)   Calcium, Total (mg/dL)  Date Value  57/90/3833 9.2   Albumin (g/dL)  Date Value  38/32/9191 1.8 (L)    Assessment/Plan: Patient received total of 140 mEq of IV and oral potassium replacement on 5/22 on top of receiving NS/40K as MIVF.  Patient in acute renal failure, serum creatinine has increased from 1.3 to 2.22. Potassium now elevated at 5.4.  5/25:  K 3.4  Scr 2.91  Scr increased. Will conservatively order KCL 10 meq PO x 1  Current MIVF NS at 76mL/hr.   Electrolytes with am labs.   Pharmacy will continue to monitor and adjust per consult.   Zyire Eidson A 04/17/2019 9:04 AM

## 2019-04-17 NOTE — Progress Notes (Signed)
Notifed [employer] of pt with large bloody liquid stool. Orders placed. Will continue to monitor and assess.

## 2019-04-18 ENCOUNTER — Inpatient Hospital Stay: Payer: Medicare Other

## 2019-04-18 DIAGNOSIS — N179 Acute kidney failure, unspecified: Secondary | ICD-10-CM

## 2019-04-18 LAB — CBC
HCT: 20.4 % — ABNORMAL LOW (ref 36.0–46.0)
HCT: 24.3 % — ABNORMAL LOW (ref 36.0–46.0)
Hemoglobin: 6.6 g/dL — ABNORMAL LOW (ref 12.0–15.0)
Hemoglobin: 8.1 g/dL — ABNORMAL LOW (ref 12.0–15.0)
MCH: 30.8 pg (ref 26.0–34.0)
MCH: 31 pg (ref 26.0–34.0)
MCHC: 32.4 g/dL (ref 30.0–36.0)
MCHC: 33.3 g/dL (ref 30.0–36.0)
MCV: 93.1 fL (ref 80.0–100.0)
MCV: 95.3 fL (ref 80.0–100.0)
Platelets: 81 10*3/uL — ABNORMAL LOW (ref 150–400)
Platelets: 85 10*3/uL — ABNORMAL LOW (ref 150–400)
RBC: 2.14 MIL/uL — ABNORMAL LOW (ref 3.87–5.11)
RBC: 2.61 MIL/uL — ABNORMAL LOW (ref 3.87–5.11)
RDW: 14.3 % (ref 11.5–15.5)
RDW: 14.4 % (ref 11.5–15.5)
WBC: 5.4 10*3/uL (ref 4.0–10.5)
WBC: 7.3 10*3/uL (ref 4.0–10.5)
nRBC: 0 % (ref 0.0–0.2)
nRBC: 0.4 % — ABNORMAL HIGH (ref 0.0–0.2)

## 2019-04-18 LAB — ABO/RH: ABO/RH(D): B POS

## 2019-04-18 LAB — HEMOGLOBIN AND HEMATOCRIT, BLOOD
HCT: 24.4 % — ABNORMAL LOW (ref 36.0–46.0)
HCT: 24.1 % — ABNORMAL LOW (ref 36.0–46.0)
Hemoglobin: 8.1 g/dL — ABNORMAL LOW (ref 12.0–15.0)
Hemoglobin: 8.1 g/dL — ABNORMAL LOW (ref 12.0–15.0)

## 2019-04-18 LAB — COMPREHENSIVE METABOLIC PANEL
ALT: 286 U/L — ABNORMAL HIGH (ref 0–44)
AST: 485 U/L — ABNORMAL HIGH (ref 15–41)
Albumin: 1.8 g/dL — ABNORMAL LOW (ref 3.5–5.0)
Alkaline Phosphatase: 321 U/L — ABNORMAL HIGH (ref 38–126)
Anion gap: 9 (ref 5–15)
BUN: 68 mg/dL — ABNORMAL HIGH (ref 8–23)
CO2: 15 mmol/L — ABNORMAL LOW (ref 22–32)
Calcium: 6.6 mg/dL — ABNORMAL LOW (ref 8.9–10.3)
Chloride: 116 mmol/L — ABNORMAL HIGH (ref 98–111)
Creatinine, Ser: 3.82 mg/dL — ABNORMAL HIGH (ref 0.44–1.00)
GFR calc Af Amer: 13 mL/min — ABNORMAL LOW (ref >60)
GFR calc non Af Amer: 11 mL/min — ABNORMAL LOW (ref >60)
Glucose, Bld: 98 mg/dL (ref 70–99)
Potassium: 3.4 mmol/L — ABNORMAL LOW (ref 3.5–5.1)
Sodium: 140 mmol/L (ref 135–145)
Total Bilirubin: 1.7 mg/dL — ABNORMAL HIGH (ref 0.3–1.2)
Total Protein: 4.4 g/dL — ABNORMAL LOW (ref 6.5–8.1)

## 2019-04-18 LAB — BLOOD GAS, ARTERIAL
Acid-base deficit: 11.8 mmol/L — ABNORMAL HIGH (ref 0.0–2.0)
Allens test (pass/fail): POSITIVE — AB
Bicarbonate: 12.4 mmol/L — ABNORMAL LOW (ref 20.0–28.0)
FIO2: 0.28
O2 Saturation: 94.6 %
Patient temperature: 37
pCO2 arterial: 24 mmHg — ABNORMAL LOW (ref 32.0–48.0)
pH, Arterial: 7.32 — ABNORMAL LOW (ref 7.350–7.450)
pO2, Arterial: 80 mmHg — ABNORMAL LOW (ref 83.0–108.0)

## 2019-04-18 LAB — PREPARE RBC (CROSSMATCH)

## 2019-04-18 IMAGING — DX ABDOMEN - 1 VIEW
1 series · 1 of 1 positions shown · non-contrast
Comparison: Dominant CT 3 days ago

CLINICAL DATA: Right femoral line placement

EXAM:
ABDOMEN - 1 VIEW

[abdomen supine]
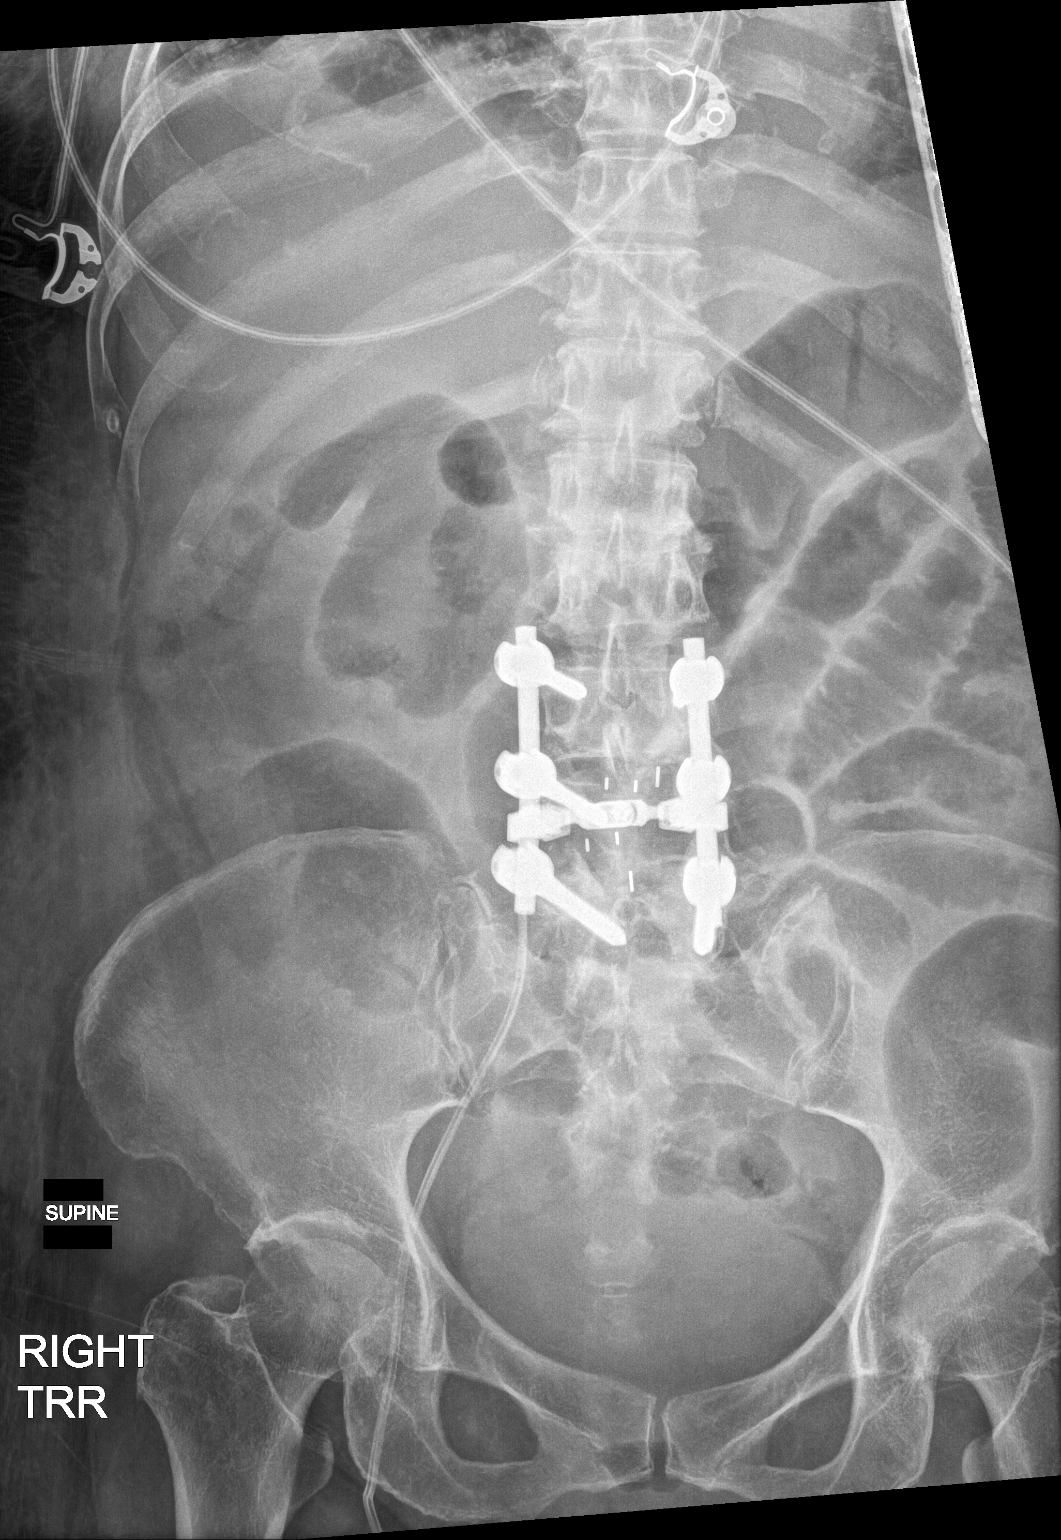

[1 of 1 positions shown; findings below may reference images not displayed]

FINDINGS: Right femoral line. The tip is obscured by lumbar fusion hardware
but is likely at the level of the iliac confluence.

Mild gaseous distension of bowel, question mild ileus. Probable
anasarca seen at the right flank.
IMPRESSION: 1. Femoral line with tip obscured by lumbar fusion hardware. The tip
is either at the iliac confluence or IVC.
2. Mild generalized gaseous distension of bowel, there may be
developing ileus.

## 2019-04-18 MED ORDER — HYDROCORTISONE NA SUCCINATE PF 100 MG IJ SOLR
100.0000 mg | Freq: Two times a day (BID) | INTRAMUSCULAR | Status: DC
  Administered 2019-04-18 – 2019-04-21 (×7): 100 mg via INTRAVENOUS
  Filled 2019-04-18 (×7): qty 2

## 2019-04-18 MED ORDER — AMIODARONE HCL IN DEXTROSE 360-4.14 MG/200ML-% IV SOLN
30.0000 mg/h | INTRAVENOUS | Status: DC
  Administered 2019-04-18: 30 mg/h via INTRAVENOUS
  Filled 2019-04-18 (×5): qty 200

## 2019-04-18 MED ORDER — AMIODARONE HCL IN DEXTROSE 360-4.14 MG/200ML-% IV SOLN
60.0000 mg/h | INTRAVENOUS | Status: DC
  Administered 2019-04-18: 60 mg/h via INTRAVENOUS
  Filled 2019-04-18: qty 200

## 2019-04-18 MED ORDER — POTASSIUM CHLORIDE 10 MEQ/100ML IV SOLN
10.0000 meq | INTRAVENOUS | Status: AC
  Administered 2019-04-18 (×2): 10 meq via INTRAVENOUS
  Filled 2019-04-18 (×2): qty 100

## 2019-04-18 MED ORDER — DEXTROSE 5 % IV SOLN
60.0000 mg/h | INTRAVENOUS | Status: DC
  Filled 2019-04-18: qty 9

## 2019-04-18 MED ORDER — SODIUM BICARBONATE 8.4 % IV SOLN
100.0000 meq | Freq: Once | INTRAVENOUS | Status: AC
  Administered 2019-04-18: 05:00:00 100 meq via INTRAVENOUS
  Filled 2019-04-18: qty 100

## 2019-04-18 MED ORDER — PHENYLEPHRINE HCL-NACL 10-0.9 MG/250ML-% IV SOLN
0.0000 ug/min | INTRAVENOUS | Status: DC
  Administered 2019-04-18: 04:00:00 200 ug/min via INTRAVENOUS
  Filled 2019-04-18: qty 250

## 2019-04-18 MED ORDER — SODIUM CHLORIDE 0.9 % IV BOLUS
250.0000 mL | Freq: Once | INTRAVENOUS | Status: AC
  Administered 2019-04-18: 01:00:00 250 mL via INTRAVENOUS

## 2019-04-18 MED ORDER — LACTATED RINGERS IV BOLUS
1000.0000 mL | Freq: Once | INTRAVENOUS | Status: AC
  Administered 2019-04-18: 1000 mL via INTRAVENOUS

## 2019-04-18 MED ORDER — METOPROLOL TARTRATE 5 MG/5ML IV SOLN: 2.5000 mg | Freq: Once | INTRAVENOUS | Status: DC

## 2019-04-18 MED ORDER — MIDODRINE HCL 5 MG PO TABS
10.0000 mg | ORAL_TABLET | Freq: Three times a day (TID) | ORAL | Status: DC
  Administered 2019-04-18 – 2019-04-23 (×13): 10 mg via ORAL
  Filled 2019-04-18 (×14): qty 2

## 2019-04-18 MED ORDER — LACTATED RINGERS IV BOLUS: 500.0000 mL | Freq: Once | INTRAVENOUS | Status: DC

## 2019-04-18 MED ORDER — DEXTROSE 5 % IV SOLN
30.0000 mg/h | INTRAVENOUS | Status: DC
  Filled 2019-04-18: qty 9

## 2019-04-18 MED ORDER — AMIODARONE LOAD VIA INFUSION
150.0000 mg | Freq: Once | INTRAVENOUS | Status: DC
  Filled 2019-04-18: qty 83.34

## 2019-04-18 MED ORDER — DIGOXIN 125 MCG PO TABS
0.1250 mg | ORAL_TABLET | Freq: Every day | ORAL | Status: DC
  Administered 2019-04-18: 18:00:00 0.125 mg via ORAL
  Filled 2019-04-18 (×2): qty 1

## 2019-04-18 MED ORDER — DEXTROSE 5 % IV SOLN: 30.0000 mg/h | INTRAVENOUS | Status: DC

## 2019-04-18 MED ORDER — POTASSIUM CHLORIDE 10 MEQ/100ML IV SOLN
10.0000 meq | INTRAVENOUS | Status: AC
  Administered 2019-04-18 (×4): 10 meq via INTRAVENOUS
  Filled 2019-04-18 (×4): qty 100

## 2019-04-18 MED ORDER — LIDOCAINE HCL 2 % IJ SOLN
5.0000 mL | Freq: Once | INTRAMUSCULAR | Status: AC
  Administered 2019-04-18: 100 mg via INTRADERMAL
  Filled 2019-04-18: qty 10

## 2019-04-18 MED ORDER — AMIODARONE HCL IN DEXTROSE 360-4.14 MG/200ML-% IV SOLN: 30.0000 mg/h | INTRAVENOUS | Status: DC

## 2019-04-18 MED ORDER — ALBUMIN HUMAN 25 % IV SOLN
12.5000 g | Freq: Once | INTRAVENOUS | Status: AC
  Administered 2019-04-18: 12.5 g via INTRAVENOUS
  Filled 2019-04-18: qty 50

## 2019-04-18 MED ORDER — SODIUM BICARBONATE 8.4 % IV SOLN
INTRAVENOUS | Status: DC
  Administered 2019-04-18 – 2019-04-20 (×5): via INTRAVENOUS
  Filled 2019-04-18 (×9): qty 150

## 2019-04-18 MED ORDER — SODIUM CHLORIDE 0.9% IV SOLUTION
Freq: Once | INTRAVENOUS | Status: AC
  Administered 2019-04-18: 01:00:00 via INTRAVENOUS

## 2019-04-18 MED ORDER — DEXMEDETOMIDINE HCL IN NACL 400 MCG/100ML IV SOLN: 0.4000 ug/kg/h | INTRAVENOUS | Status: DC

## 2019-04-18 MED ORDER — AMIODARONE IV BOLUS ONLY 150 MG/100ML
150.0000 mg | Freq: Once | INTRAVENOUS | Status: AC
  Administered 2019-04-18: 150 mg via INTRAVENOUS
  Filled 2019-04-18: qty 100

## 2019-04-18 MED ORDER — DEXTROSE 5 % IV SOLN: 60.0000 mg/h | INTRAVENOUS | Status: DC

## 2019-04-18 MED ORDER — SODIUM CHLORIDE 0.9 % IV SOLN
0.0000 ug/min | INTRAVENOUS | Status: DC
  Administered 2019-04-18: 05:00:00 50 ug/min via INTRAVENOUS
  Filled 2019-04-18 (×2): qty 4

## 2019-04-18 NOTE — Progress Notes (Signed)
Per Dr Darnelle Maffucci stop amiodarone, heart rate mid 50's.

## 2019-04-18 NOTE — Progress Notes (Signed)
[facility]  ROUNDING NOTE   Subjective:   PRBC transfusion  Weaned off phenylephrine  LR bolus   Objective:  Vital signs in last 24 hours:  Temp:  [97.5 F (36.4 C)-98.8 F (37.1 C)] 97.9 F (36.6 C) (05/26 0700) Pulse Rate:  [63-159] 65 (05/26 1030) Resp:  [16-33] 19 (05/26 1030) BP: (58-169)/(41-101) 95/54 (05/26 1030) SpO2:  [89 %-100 %] 100 % (05/26 1030) Weight:  [57.4 kg] 57.4 kg (05/25 1246)  Weight change: 2.288 kg Filed Weights   04/16/19 0010 04/17/19 0301 04/17/19 1246  Weight: 51 kg 55.1 kg 57.4 kg    Intake/Output: I/O last 3 completed shifts: In: 6260.2 [I.V.:2505.2; Blood:417.1; IV Piggyback:3337.9] Out: 200 [Urine:200]   Intake/Output this shift:  Total I/O In: -  Out: 300 [Urine:300]  Physical Exam: General: NAD,   Head: Normocephalic, atraumatic. Moist oral mucosal membranes  Eyes: Anicteric, PERRL  Neck: Supple, trachea midline  Lungs:  Clear to auscultation  Heart: Regular rate and rhythm  Abdomen:  RUQ tenderness to palpation  Extremities:  no peripheral edema.  Neurologic: Nonfocal, moving all four extremities  Skin: No lesions        Basic Metabolic Panel: Recent Labs  Lab 04/14/19 1032 04/14/19 1649  04/15/19 1325 04/15/19 2215 04/16/19 0351 04/17/19 0436 04/18/19 0032  NA 135  --    < > 136 137 139 136 140  K 2.2* 2.6*   < > 4.6 3.8 3.7 3.4* 3.4*  CL 97*  --    < > 107 111 112* 113* 116*  CO2 27  --    < > 20* 18* 17* 16* 15*  GLUCOSE 113*  --    < > 101* 100* 78 118* 98  BUN 27*  --    < > 35* 32* 31* 58* 68*  CREATININE 1.30*  --    < > 2.38* 2.28* 2.11* 2.91* 3.82*  CALCIUM 9.1  --    < > 8.1* 7.3* 7.3* 6.7* 6.6*  MG 1.9  1.8  --   --   --  1.9 2.0  --   --   PHOS  --  3.0  --   --   --   --   --   --    < > = values in this interval not displayed.    Liver Function Tests: Recent Labs  Lab 04/14/19 1032 04/16/19 0351 04/17/19 0436 04/18/19 0032  AST 102* 270* 482* 485*  ALT 78* 176* 281*  286*  ALKPHOS 130* 173* 181* 321*  BILITOT 1.1 2.1* 1.2 1.7*  PROT 7.4 5.3* 4.2* 4.4*  ALBUMIN 3.5 2.5* 1.8* 1.8*   Recent Labs  Lab 04/14/19 1032  LIPASE 26   No results for input(s): AMMONIA in the last 168 hours.  CBC: Recent Labs  Lab 04/14/19 1032 04/15/19 0521 04/16/19 0351 04/17/19 0436 04/17/19 0650 04/17/19 1206 04/17/19 1456 04/17/19 1846 04/18/19 0032 04/18/19 0646  WBC 8.9 8.5 7.7 5.5  --   --   --   --  5.4 7.3  NEUTROABS 7.6  --  6.3 4.2  --   --   --   --   --   --   HGB 11.8* 9.9* 11.6* 7.6* 8.0* 8.0*  --  7.3* 6.6* 8.1*  8.1*  HCT 35.5* 30.7* 36.1 24.9* 24.8* 25.2*  --  22.6* 20.4* 24.3*  24.4*  MCV 93.7 96.2 96.3 101.6*  --   --   --   --  95.3  93.1  PLT 149* 107* 106* 90*  --   --  84*  --  81* 85*    Cardiac Enzymes: Recent Labs  Lab 04/14/19 1032 04/15/19 2215 04/16/19 0351 04/16/19 0946  CKTOTAL  --   --   --  272*  TROPONINI 0.04* <0.03 <0.03 0.03*    BNP: Invalid input(s): POCBNP  CBG: No results for input(s): GLUCAP in the last 168 hours.  Microbiology: Results for orders placed or performed during the hospital encounter of 04/14/19  SARS Coronavirus 2 (CEPHEID - Performed in [facility] hospital lab), Hosp Order     Status: None   Collection Time: 04/14/19 10:32 AM  Result Value Ref Range Status   SARS Coronavirus 2 NEGATIVE NEGATIVE Final    Comment: (NOTE) If result is NEGATIVE SARS-CoV-2 target nucleic acids are NOT DETECTED. The SARS-CoV-2 RNA is generally detectable in upper and lower  respiratory specimens during the acute phase of infection. The lowest  concentration of SARS-CoV-2 viral copies this assay can detect is 250  copies / mL. A negative result does not preclude SARS-CoV-2 infection  and should not be used as the sole basis for treatment or other  patient management decisions.  A negative result may occur with  improper specimen collection / handling, submission of specimen other  than nasopharyngeal swab,  presence of viral mutation(s) within the  areas targeted by this assay, and inadequate number of viral copies  (<250 copies / mL). A negative result must be combined with clinical  observations, patient history, and epidemiological information. If result is POSITIVE SARS-CoV-2 target nucleic acids are DETECTED. The SARS-CoV-2 RNA is generally detectable in upper and lower  respiratory specimens dur ing the acute phase of infection.  Positive  results are indicative of active infection with SARS-CoV-2.  Clinical  correlation with patient history and other diagnostic information is  necessary to determine patient infection status.  Positive results do  not rule out bacterial infection or co-infection with other viruses. If result is PRESUMPTIVE POSTIVE SARS-CoV-2 nucleic acids MAY BE PRESENT.   A presumptive positive result was obtained on the submitted specimen  and confirmed on repeat testing.  While 2019 novel coronavirus  (SARS-CoV-2) nucleic acids may be present in the submitted sample  additional confirmatory testing may be necessary for epidemiological  and / or clinical management purposes  to differentiate between  SARS-CoV-2 and other Sarbecovirus currently known to infect humans.  If clinically indicated additional testing with an alternate test  methodology [phone]) is advised. The SARS-CoV-2 RNA is generally  detectable in upper and lower respiratory sp ecimens during the acute  phase of infection. The expected result is Negative. Fact Sheet for Patients:  BoilerBrush.com.cy Fact Sheet for Healthcare Providers: https://pope.com/ This test is not yet approved or cleared by the [place] FDA and has been authorized for detection and/or diagnosis of SARS-CoV-2 by FDA under an Emergency Use Authorization (EUA).  This EUA will remain in effect (meaning this test can be used) for the duration of the COVID-19 declaration under  Section 564(b)(1) of the Act, 21 U.S.C. section 360bbb-3(b)(1), unless the authorization is terminated or revoked sooner. Performed at [facility], [address] [place], [place] [address]   MRSA PCR Screening     Status: None   Collection Time: 04/14/19  7:22 PM  Result Value Ref Range Status   MRSA by PCR NEGATIVE NEGATIVE Final    Comment:        The GeneXpert MRSA Assay (  FDA approved for NASAL specimens only), is one component of a comprehensive MRSA colonization surveillance program. It is not intended to diagnose MRSA infection nor to guide or monitor treatment for MRSA infections. Performed at [facility], [address] [place], [place] [address]   CULTURE, BLOOD (ROUTINE X 2) w Reflex to ID Panel     Status: None (Preliminary result)   Collection Time: 04/16/19  7:47 AM  Result Value Ref Range Status   Specimen Description BLOOD BLOOD RIGHT HAND  Final   Special Requests   Final    BOTTLES DRAWN AEROBIC AND ANAEROBIC Blood Culture adequate volume   Culture   Final    NO GROWTH 2 DAYS Performed at [facility], [address], [place], [place] [address]    Report Status PENDING  Incomplete  CULTURE, BLOOD (ROUTINE X 2) w Reflex to ID Panel     Status: None (Preliminary result)   Collection Time: 04/16/19  9:27 AM  Result Value Ref Range Status   Specimen Description BLOOD BLOOD RIGHT HAND  Final   Special Requests   Final    BOTTLES DRAWN AEROBIC ONLY Blood Culture adequate volume   Culture   Final    NO GROWTH 2 DAYS Performed at [facility], [address] [place], [place] [address]    Report Status PENDING  Incomplete  MRSA PCR Screening     Status: None   Collection Time: 04/17/19  1:47 PM  Result Value Ref Range Status   MRSA by PCR NEGATIVE NEGATIVE Final    Comment:        The GeneXpert MRSA Assay (FDA approved for NASAL specimens only), is one component of a comprehensive MRSA colonization surveillance  program. It is not intended to diagnose MRSA infection nor to guide or monitor treatment for MRSA infections. Performed at [facility], [address] [place], [place] [address]     Coagulation Studies: Recent Labs    04/17/19 1206  LABPROT 13.1  INR 1.0    Urinalysis: No results for input(s): COLORURINE, LABSPEC, PHURINE, GLUCOSEU, HGBUR, BILIRUBINUR, KETONESUR, PROTEINUR, UROBILINOGEN, NITRITE, LEUKOCYTESUR in the last 72 hours.  Invalid input(s): APPERANCEUR    Imaging: Dg Abd 1 View  Result Date: 04/18/2019 CLINICAL DATA:  Right femoral line placement EXAM: ABDOMEN - 1 VIEW COMPARISON:  Dominant CT 3 days ago FINDINGS: Right femoral line. The tip is obscured by lumbar fusion hardware but is likely at the level of the iliac confluence. Mild gaseous distension of bowel, question mild ileus. Probable anasarca seen at the right flank. IMPRESSION: 1. Femoral line with tip obscured by lumbar fusion hardware. The tip is either at the iliac confluence or IVC. 2. Mild generalized gaseous distension of bowel, there may be developing ileus. Electronically Signed   By: Marnee Spring M.D.   On: 04/18/2019 04:27   US Renal  Result Date: 04/17/2019 CLINICAL DATA:  Acute renal failure EXAM: RENAL / URINARY TRACT ULTRASOUND COMPLETE COMPARISON:  CT from 04/15/2019 FINDINGS: Right Kidney: Renal measurements: 9.8 x 4.5 x 4.7 cm. = volume: 107 mL . Echogenicity within normal limits. No mass or hydronephrosis visualized. Left Kidney: Renal measurements: 9.7 x 5.1 x 5.6 cm. = volume: 145 mL. Echogenicity within normal limits. No mass or hydronephrosis visualized. Bladder: Appears normal for degree of bladder distention. Increased echogenicity of the liver is noted consistent with fatty infiltration. Minimal free fluid is noted adjacent to the liver. IMPRESSION: Kidneys are within normal limits. Fatty infiltration  of the liver. Minimal free fluid in the abdomen. Electronically Signed   By:  Alcide CleverMark  Lukens M.D.   On: 04/17/2019 21:08     Medications:   . sodium chloride Stopped (04/18/19 0408)  . amiodarone 30 mg/hr (04/18/19 0811)  . cefTRIAXone (ROCEPHIN)  IV Stopped (04/18/19 0003)  . metronidazole 500 mg (04/18/19 0800)  . phenylephrine (NEO-SYNEPHRINE) Adult infusion 60 mcg/min (04/18/19 0647)  . sodium chloride     . aspirin  81 mg Oral Daily  . atorvastatin  80 mg Oral Daily  . hydrocortisone sod succinate (SOLU-CORTEF) inj  100 mg Intravenous Q12H  . midodrine  10 mg Oral TID WC  . pantoprazole (PROTONIX) IV  40 mg Intravenous Q12H  . sodium chloride flush  3 mL Intravenous Once  . venlafaxine XR  37.5 mg Oral Q breakfast   acetaminophen **OR** acetaminophen, meclizine, ondansetron **OR** ondansetron (ZOFRAN) IV, senna-docusate  Assessment/ Plan:  Ms. Vernelle EmeraldLamai Raybourn is a 73 y.o. Asian female with a PMHx of hypertension, degenerative disc disease, lumbar radiculitis, history of depression, osteopenia, who [facility] for evaluation of nausea, vomiting, and dizziness.   1.  Acute renal failure with metabolic acidosis: secondary to prerenal azotemia, GI losses and blood losses. ATN. Nonoliguric urine output.  Hematuria and proteinuria on admission.  Creatinine continues to rise.  - Change to sodium bicarbonate infusion.  - Holding home losartan, chlorthalidone and meloxicam.  - Pending serologic work up.   2. Hypotension with atrial fibrillation. Required vasopressors.  - amiodarone gtt.   3. Anemia with renal failure: status post 1 unit PRBC today.    LOS: 2 Sarah Sullivan 5/26/202012:00 PM

## 2019-04-18 NOTE — Progress Notes (Signed)
Per Dr Darnelle Maffucci ordered Digoxin once a day and stopped Amiodarone drip.

## 2019-04-18 NOTE — Progress Notes (Signed)
Pharmacy Electrolyte Monitoring Consult:  Pharmacy consulted to assist in monitoring and replacing electrolytes in this 73 y.o. female admitted on 04/14/2019 with Nausea and Emesis   Labs:  Sodium (mmol/L)  Date Value  04/18/2019 140  12/25/2014 141   Potassium (mmol/L)  Date Value  04/18/2019 3.4 (L)  12/25/2014 3.5   Magnesium (mg/dL)  Date Value  32/95/1884 2.0   Phosphorus (mg/dL)  Date Value  16/60/6301 3.0   Calcium (mg/dL)  Date Value  60/08/9322 6.6 (L)   Calcium, Total (mg/dL)  Date Value  55/73/2202 9.2   Albumin (g/dL)  Date Value  54/27/0623 1.8 (L)    Assessment/Plan: Patient received total of 140 mEq of IV and oral potassium replacement on 5/22 on top of receiving NS/40K as MIVF.  Patient in acute renal failure, serum creatinine continues upward trend.   5/26:  K 3.4  Scr 3.82  Scr increased. Patient received KCL 10 meq IV x 2 this morning.   Current MIVF NS at 58mL/hr.   Electrolytes with am labs.   Pharmacy will continue to monitor and adjust per consult.   Clovia Cuff, PharmD, BCPS 04/18/2019 12:58 PM

## 2019-04-18 NOTE — Progress Notes (Signed)
[name] , MD [address], [address], [place], [place], [address] [address], [address], [place], [place], [address] Phone: [phone]  Fax: [phone]   Sarah Sullivan is being followed for abnormal LFT's and rt sided colitis   Subjective: No complaints, no pain , no diarrhea   Objective: Vital signs in last 24 hours: Vitals:   04/18/19 0700 04/18/19 0817 04/18/19 0830 04/18/19 0900  BP: (!) 86/62 (!) 106/42 (!) 97/51 (!) 87/48  Pulse: 71 78 71 67  Resp: (!) 22 (!) 21 (!) 26 (!) 23  Temp: 97.9 F (36.6 C)     TempSrc:      SpO2: 96% 98% 96% 98%  Weight:      Height:       Weight change: 2.288 kg  Intake/Output Summary (Last 24 hours) at 04/18/2019 0931 Last data filed at 04/18/2019 5284 Gross per 24 hour  Intake 6260.15 ml  Output 300 ml  Net 5960.15 ml     Exam: Heart:: Regular rate and rhythm, S1S2 present or without murmur or extra heart sounds Lungs: normal, clear to auscultation and clear to auscultation and percussion Abdomen: soft, nontender, normal bowel sounds   Lab Results: @ Micro Results: Recent Results (from the past 240 hour(s))  SARS Coronavirus 2 (CEPHEID - Performed in [facility] hospital lab), Hosp Order     Status: None   Collection Time: 04/14/19 10:32 AM  Result Value Ref Range Status   SARS Coronavirus 2 NEGATIVE NEGATIVE Final    Comment: (NOTE) If result is NEGATIVE SARS-CoV-2 target nucleic acids are NOT DETECTED. The SARS-CoV-2 RNA is generally detectable in upper and lower  respiratory specimens during the acute phase of infection. The lowest  concentration of SARS-CoV-2 viral copies this assay can detect is 250  copies / mL. A negative result does not preclude SARS-CoV-2 infection  and should not be used as the sole basis for treatment or other  patient management decisions.  A negative result may occur with  improper specimen collection / handling, submission of specimen other  than nasopharyngeal swab,  presence of viral mutation(s) within the  areas targeted by this assay, and inadequate number of viral copies  (<250 copies / mL). A negative result must be combined with clinical  observations, patient history, and epidemiological information. If result is POSITIVE SARS-CoV-2 target nucleic acids are DETECTED. The SARS-CoV-2 RNA is generally detectable in upper and lower  respiratory specimens dur ing the acute phase of infection.  Positive  results are indicative of active infection with SARS-CoV-2.  Clinical  correlation with patient history and other diagnostic information is  necessary to determine patient infection status.  Positive results do  not rule out bacterial infection or co-infection with other viruses. If result is PRESUMPTIVE POSTIVE SARS-CoV-2 nucleic acids MAY BE PRESENT.   A presumptive positive result was obtained on the submitted specimen  and confirmed on repeat testing.  While 2019 novel coronavirus  (SARS-CoV-2) nucleic acids may be present in the submitted sample  additional confirmatory testing may be necessary for epidemiological  and / or clinical management purposes  to differentiate between  SARS-CoV-2 and other Sarbecovirus currently known to infect humans.  If clinically indicated additional testing with an alternate test  methodology [phone]) is advised. The SARS-CoV-2 RNA is generally  detectable in upper and lower respiratory sp ecimens during the acute  phase of infection. The expected result is Negative. Fact Sheet for Patients:  BoilerBrush.com.cy Fact Sheet for Healthcare Providers: https://pope.com/  This test is not yet approved or cleared by the [place] and has been authorized for detection and/or diagnosis of SARS-CoV-2 by FDA under an Emergency Use Authorization (EUA).  This EUA will remain in effect (meaning this test can be used) for the duration of the COVID-19 declaration under  Section 564(b)(1) of the Act, 21 U.S.C. section 360bbb-3(b)(1), unless the authorization is terminated or revoked sooner. Performed at [facility], [address] [place], [place] [address]   MRSA PCR Screening     Status: None   Collection Time: 04/14/19  7:22 PM  Result Value Ref Range Status   MRSA by PCR NEGATIVE NEGATIVE Final    Comment:        The GeneXpert MRSA Assay (FDA approved for NASAL specimens only), is one component of a comprehensive MRSA colonization surveillance program. It is not intended to diagnose MRSA infection nor to guide or monitor treatment for MRSA infections. Performed at [facility], [address] [place], [place] [address]   CULTURE, BLOOD (ROUTINE X 2) w Reflex to ID Panel     Status: None (Preliminary result)   Collection Time: 04/16/19  7:47 AM  Result Value Ref Range Status   Specimen Description BLOOD BLOOD RIGHT HAND  Final   Special Requests   Final    BOTTLES DRAWN AEROBIC AND ANAEROBIC Blood Culture adequate volume   Culture   Final    NO GROWTH 2 DAYS Performed at [facility], [address] [place], [place] [address]    Report Status PENDING  Incomplete  CULTURE, BLOOD (ROUTINE X 2) w Reflex to ID Panel     Status: None (Preliminary result)   Collection Time: 04/16/19  9:27 AM  Result Value Ref Range Status   Specimen Description BLOOD BLOOD RIGHT HAND  Final   Special Requests   Final    BOTTLES DRAWN AEROBIC ONLY Blood Culture adequate volume   Culture   Final    NO GROWTH 2 DAYS Performed at [facility], [address], [place], [place] [address]    Report Status PENDING  Incomplete  MRSA PCR Screening     Status: None   Collection Time: 04/17/19  1:47 PM  Result Value Ref Range Status   MRSA by PCR NEGATIVE NEGATIVE Final    Comment:        The GeneXpert MRSA Assay (FDA approved for NASAL specimens only), is one component of a comprehensive MRSA colonization surveillance  program. It is not intended to diagnose MRSA infection nor to guide or monitor treatment for MRSA infections. Performed at [facility], [address] [place], [place] [address]    Studies/Results: Dg Abd 1 View  Result Date: 04/18/2019 CLINICAL DATA:  Right femoral line placement EXAM: ABDOMEN - 1 VIEW COMPARISON:  Dominant CT 3 days ago FINDINGS: Right femoral line. The tip is obscured by lumbar fusion hardware but is likely at the level of the iliac confluence. Mild gaseous distension of bowel, question mild ileus. Probable anasarca seen at the right flank. IMPRESSION: 1. Femoral line with tip obscured by lumbar fusion hardware. The tip is either at the iliac confluence or IVC. 2. Mild generalized gaseous distension of bowel, there may be developing ileus. Electronically Signed   By: Marnee Spring M.D.   On: 04/18/2019 04:27   US Renal  Result Date: 04/17/2019 CLINICAL DATA:  Acute renal failure EXAM: RENAL / URINARY TRACT ULTRASOUND COMPLETE COMPARISON:  CT from 04/15/2019 FINDINGS:  Right Kidney: Renal measurements: 9.8 x 4.5 x 4.7 cm. = volume: 107 mL . Echogenicity within normal limits. No mass or hydronephrosis visualized. Left Kidney: Renal measurements: 9.7 x 5.1 x 5.6 cm. = volume: 145 mL. Echogenicity within normal limits. No mass or hydronephrosis visualized. Bladder: Appears normal for degree of bladder distention. Increased echogenicity of the liver is noted consistent with fatty infiltration. Minimal free fluid is noted adjacent to the liver. IMPRESSION: Kidneys are within normal limits. Fatty infiltration of the liver. Minimal free fluid in the abdomen. Electronically Signed   By: Alcide CleverMark  Lukens M.D.   On: 04/17/2019 21:08   Medications: I have reviewed the patient's current medications. Scheduled Meds: . aspirin  81 mg Oral Daily  . atorvastatin  80 mg Oral Daily  . pantoprazole (PROTONIX) IV  40 mg Intravenous Q12H  . sodium chloride flush  3 mL Intravenous Once   . venlafaxine XR  37.5 mg Oral Q breakfast   Continuous Infusions: . sodium chloride Stopped (04/18/19 0408)  . amiodarone 30 mg/hr (04/18/19 0811)  . cefTRIAXone (ROCEPHIN)  IV Stopped (04/18/19 0003)  . metronidazole 500 mg (04/18/19 0800)  . phenylephrine (NEO-SYNEPHRINE) Adult infusion 60 mcg/min (04/18/19 0647)  . sodium chloride     PRN Meds:.acetaminophen **OR** acetaminophen, meclizine, ondansetron **OR** ondansetron (ZOFRAN) IV, senna-docusate   Assessment: Active Problems:   Hypokalemia   Colitis Sarah Sullivan 73 y.o. female admitted with nausea,vomiting and diarrrhea leading to AKI and presumed shock liver. Rt sided colitis seen on imaging . Also found to have thrombocytopenia. Renal failure worsening . ?? HUS/TTP although more likely pre renal etiology with added effects of losartan, meloxicam , chlorthalidone. 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 Consider peripheral blood smear looking for shistocytes/ Hematology opinion - renal failure, low platelet count. 2. IF has diarrhea check stool for GI PCR and C diff 3. F/u acute hepatitis panel- will also check liver doppler USG/acute EBV,CMV,HSV,VZV serologies since LFT's continue to rise.  4. Once she recovers from this can consider endoscopy as an outpatient.     LOS: 2 days   Wyline MoodKiran Marcina Kinnison, MD 04/18/2019, 9:31 AM

## 2019-04-18 NOTE — Progress Notes (Signed)
CRITICAL CARE NOTE        SUBJECTIVE FINDINGS & SIGNIFICANT EVENTS   Patient remains critically ill Prognosis is guarded  Acute blood loss anemia S/p PRBc Plan for NPO post midnight for US-liver BNP elevated this am Neosynephrine down to 50 NS at 125  PAST MEDICAL HISTORY   Past Medical History:  Diagnosis Date  . Hypertension      SURGICAL HISTORY   Past Surgical History:  Procedure Laterality Date  . ABDOMINAL HYSTERECTOMY       FAMILY HISTORY   History reviewed. No pertinent family history.   SOCIAL HISTORY   Social History   Tobacco Use  . Smoking status: Never Smoker  . Smokeless tobacco: Never Used  Substance Use Topics  . Alcohol use: Not Currently  . Drug use: Not Currently     MEDICATIONS   Current Medication:  Current Facility-Administered Medications:  .  0.9 %  sodium chloride infusion, , Intravenous, Continuous, Salena Saner, MD, Stopped at 04/18/19 0408 .  acetaminophen (TYLENOL) tablet 650 mg, 650 mg, Oral, Q6H PRN, 650 mg at 04/18/19 0107 **OR** acetaminophen (TYLENOL) suppository 650 mg, 650 mg, Rectal, Q6H PRN, Pyreddy, Pavan, MD .  amiodarone (NEXTERONE PREMIX) 360-4.14 MG/200ML-% (1.8 mg/mL) IV infusion, 30 mg/hr, Intravenous, Continuous, Blakeney, Dana G, NP, Last Rate: 16.67 mL/hr at 04/18/19 0811, 30 mg/hr at 04/18/19 0811 .  aspirin chewable tablet 81 mg, 81 mg, Oral, Daily, Pyreddy, Pavan, MD, 81 mg at 04/18/19 0811 .  atorvastatin (LIPITOR) tablet 80 mg, 80 mg, Oral, Daily, Pyreddy, Pavan, MD, 80 mg at 04/17/19 0829 .  cefTRIAXone (ROCEPHIN) 2 g in sodium chloride 0.9 % 100 mL IVPB, 2 g, Intravenous, Q24H, Salena Saner, MD, Stopped at 04/18/19 0003 .  meclizine (ANTIVERT) tablet 12.5 mg, 12.5 mg, Oral, TID PRN, Salena Saner, MD .   metroNIDAZOLE (FLAGYL) IVPB 500 mg, 500 mg, Intravenous, Q8H, Salena Saner, MD, Last Rate: 100 mL/hr at 04/18/19 0800, 500 mg at 04/18/19 0800 .  ondansetron (ZOFRAN) tablet 4 mg, 4 mg, Oral, Q6H PRN **OR** ondansetron (ZOFRAN) injection 4 mg, 4 mg, Intravenous, Q6H PRN, Pyreddy, Pavan, MD, 4 mg at 04/17/19 1350 .  pantoprazole (PROTONIX) injection 40 mg, 40 mg, Intravenous, Q12H, Vanga, Loel Dubonnet, MD, 40 mg at 04/18/19 0811 .  phenylephrine (NEO-SYNEPHRINE) 40 mg in sodium chloride 0.9 % 250 mL (0.16 mg/mL) infusion, 0-400 mcg/min, Intravenous, Titrated, Blakeney, Neldon Newport, NP, Last Rate: 22.5 mL/hr at 04/18/19 0647, 60 mcg/min at 04/18/19 0647 .  senna-docusate (Senokot-S) tablet 1 tablet, 1 tablet, Oral, QHS PRN, Pyreddy, Pavan, MD .  sodium chloride 0.9 % bolus 1,000 mL, 1,000 mL, Intravenous, Once, Mayo, Allyn Kenner, MD .  sodium chloride flush (NS) 0.9 % injection 3 mL, 3 mL, Intravenous, Once, Jene Every, MD .  venlafaxine XR (EFFEXOR-XR) 24 hr capsule 37.5 mg, 37.5 mg, Oral, Q breakfast, Pyreddy, Pavan, MD, 37.5 mg at 04/18/19 9147    ALLERGIES   Patient has no known allergies.    REVIEW OF SYSTEMS    10 system ROS done and is negative except as per subjective findings  PHYSICAL EXAMINATION   Vitals:   04/18/19 0830 04/18/19 0900  BP: (!) 97/51 (!) 87/48  Pulse: 71 67  Resp: (!) 26 (!) 23  Temp:    SpO2: 96% 98%    GENERAL:mild distress  HEAD: Normocephalic, atraumatic.  EYES: Pupils equal, round, reactive to light.  No scleral icterus.  MOUTH: Moist mucosal membrane. NECK: Supple.  No thyromegaly. No nodules. No JVD.  PULMONARY: decreased bs with mild crackles at bases CARDIOVASCULAR: S1 and S2. Regular rate and rhythm. No murmurs, rubs, or gallops.  GASTROINTESTINAL: Soft, nontender, non-distended. No masses. Positive bowel sounds. No hepatosplenomegaly.  MUSCULOSKELETAL: No swelling, clubbing, or edema.  NEUROLOGIC: Mild distress due to acute illness  SKIN:intact,warm,dry   LABS AND IMAGING      LAB RESULTS: Recent Labs  Lab 04/16/19 0351 04/17/19 0436 04/18/19 0032  NA 139 136 140  K 3.7 3.4* 3.4*  CL 112* 113* 116*  CO2 17* 16* 15*  BUN 31* 58* 68*  CREATININE 2.11* 2.91* 3.82*  GLUCOSE 78 118* 98   Recent Labs  Lab 04/17/19 0436  04/17/19 1456 04/17/19 1846 04/18/19 0032 04/18/19 0646  HGB 7.6*   < >  --  7.3* 6.6* 8.1*  8.1*  HCT 24.9*   < >  --  22.6* 20.4* 24.3*  24.4*  WBC 5.5  --   --   --  5.4 7.3  PLT 90*  --  84*  --  81* 85*   < > = values in this interval not displayed.     IMAGING RESULTS: Dg Abd 1 View  Result Date: 04/18/2019 CLINICAL DATA:  Right femoral line placement EXAM: ABDOMEN - 1 VIEW COMPARISON:  Dominant CT 3 days ago FINDINGS: Right femoral line. The tip is obscured by lumbar fusion hardware but is likely at the level of the iliac confluence. Mild gaseous distension of bowel, question mild ileus. Probable anasarca seen at the right flank. IMPRESSION: 1. Femoral line with tip obscured by lumbar fusion hardware. The tip is either at the iliac confluence or IVC. 2. Mild generalized gaseous distension of bowel, there may be developing ileus. Electronically Signed   By: Marnee Spring M.D.   On: 04/18/2019 04:27   US Renal  Result Date: 04/17/2019 CLINICAL DATA:  Acute renal failure EXAM: RENAL / URINARY TRACT ULTRASOUND COMPLETE COMPARISON:  CT from 04/15/2019 FINDINGS: Right Kidney: Renal measurements: 9.8 x 4.5 x 4.7 cm. = volume: 107 mL . Echogenicity within normal limits. No mass or hydronephrosis visualized. Left Kidney: Renal measurements: 9.7 x 5.1 x 5.6 cm. = volume: 145 mL. Echogenicity within normal limits. No mass or hydronephrosis visualized. Bladder: Appears normal for degree of bladder distention. Increased echogenicity of the liver is noted consistent with fatty infiltration. Minimal free fluid is noted adjacent to the liver. IMPRESSION: Kidneys are within normal limits. Fatty  infiltration of the liver. Minimal free fluid in the abdomen. Electronically Signed   By: Alcide Clever M.D.   On: 04/17/2019 21:08      ASSESSMENT AND PLAN    -Multidisciplinary rounds held today  Hypovolemic shock     Resolved - likely due to hematochezia   -s/p fluid resuscitation  -Blood transfusion received -Neo-Synephrine stopped -ICU monitoring      Acute blood loss anemia    -Status post PRBC transfusion monitoring H&H   -GI consultation placed appreciate input   -Protonix 40 twice daily   -We will check peripheral blood film for schistocytes looking for possible HUS/TTP -Endoscopy as an outpatient     Transaminitis    -N.p.o. post midnight for liver ultrasound   -DC hepatotoxic medications   -Evaluate serology for EBV, CMV, HSV, VZV,    Colitis -Resolved -C. difficile and GI panel negative    Acute renal failure KDIGO stage 3  with metabolic acidosis: Nephrology on case appreciate input -   secondary  to prerenal azotemia, GI losses and blood losses. ATN. Nonoliguric urine output.  Hematuria and proteinuria on admission.  Creatinine continues to rise.  - Change to sodium bicarbonate infusion.  - Holding home losartan, chlorthalidone and meloxicam.  - Pending serologic work up.    ID -continue IV abx as prescibed -follow up cultures  GI/Nutrition GI PROPHYLAXIS as indicated DIET-->TF's as tolerated Constipation protocol as indicated  ENDO - ICU hypoglycemic\Hyperglycemia protocol -check FSBS per protocol   ELECTROLYTES -follow labs as needed -replace as needed -pharmacy consultation   DVT/GI PRX ordered -SCDs  TRANSFUSIONS AS NEEDED MONITOR FSBS ASSESS the need for LABS as needed   Critical care provider statement:    Critical care time (minutes):  35   Critical care time was exclusive of:  Separately billable procedures and treating other patients   Critical care was necessary to treat or prevent imminent or life-threatening  deterioration of the following conditions:   Acute blood loss anemia, hypovolemic shock, colitis, acute kidney injury, transaminitis, multiple comorbid conditions   Critical care was time spent personally by me on the following activities:  Development of treatment plan with patient or surrogate, discussions with consultants, evaluation of patient's response to treatment, examination of patient, obtaining history from patient or surrogate, ordering and performing treatments and interventions, ordering and review of laboratory studies and re-evaluation of patient's condition.  I assumed direction of critical care for this patient from another provider in my specialty: no    This document was prepared using Dragon voice recognition software and may include unintentional dictation errors.    Vida RiggerFuad Damarian Priola, M.D.  Division of Pulmonary & Critical Care Medicine  [facility]

## 2019-04-18 NOTE — Procedures (Signed)
Central Venous Catheter Insertion Procedure Note Cheyana Battles [id number] May 03, 1946  Procedure: Insertion of Central Venous Catheter Indications: Assessment of intravascular volume, Drug and/or fluid administration and Frequent blood sampling  Procedure Details Consent: Risks of procedure as well as the alternatives and risks of each were explained to the (patient/caregiver).  Consent for procedure obtained. Time Out: Verified patient identification, verified procedure, site/side was marked, verified correct patient position, special equipment/implants available, medications/allergies/relevent history reviewed, required imaging and test results available.  Performed  Maximum sterile technique was used including antiseptics, cap, gloves, gown, hand hygiene, mask and sheet. Skin prep: Chlorhexidine; local anesthetic administered A antimicrobial bonded/coated triple lumen catheter was placed in the right femoral vein due to emergent situation using the Seldinger technique.  Evaluation Blood flow good Complications: No apparent complications Patient did tolerate procedure well. Chest X-ray ordered to verify placement.  CXR: normal.  Right femoral CVL placed utilizing ultrasound no complications noted during or following procedure.  Sonda Rumble, AGNP  Pulmonary/Critical Care Pager [phone] (please enter 7 digits) PCCM Consult Pager [phone] (please enter 7 digits)

## 2019-04-18 NOTE — Progress Notes (Signed)
Patient ID: Sarah EmeraldLamai Sethi, female   DOB: December 22, 1945, 73 y.o.   MRN: [id number]  [employer] PROGRESS NOTE  Sarah Sullivan WUJ:[id number] DOB: December 22, 1945 DOA: 04/14/2019 PCP: Titus MouldWhite, Elizabeth Burney, NP  HPI/Subjective: Patient feeling a little bit better today.  Patient states that she did not have any bowel movements today when I saw her.  No nausea or vomiting.  Some abdominal pain.  Objective: Vitals:   04/18/19 1000 04/18/19 1030  BP: (!) 96/51 (!) 95/54  Pulse: 65 65  Resp: 20 19  Temp:    SpO2: 100% 100%    Filed Weights   04/16/19 0010 04/17/19 0301 04/17/19 1246  Weight: 51 kg 55.1 kg 57.4 kg    ROS: Review of Systems  Constitutional: Negative for chills and fever.  Eyes: Negative for blurred vision.  Respiratory: Negative for cough and shortness of breath.   Cardiovascular: Negative for chest pain.  Gastrointestinal: Positive for abdominal pain. Negative for constipation, diarrhea, nausea and vomiting.  Genitourinary: Negative for dysuria.  Musculoskeletal: Negative for joint pain.  Neurological: Negative for dizziness and headaches.   Exam: Physical Exam  Constitutional: She is oriented to person, place, and time.  HENT:  Nose: No mucosal edema.  Mouth/Throat: No oropharyngeal exudate or posterior oropharyngeal edema.  Eyes: Pupils are equal, round, and reactive to light. Conjunctivae, EOM and lids are normal.  Neck: No JVD present. Carotid bruit is not present. No edema present. No thyroid mass and no thyromegaly present.  Cardiovascular: S1 normal and S2 normal. Exam reveals no gallop.  No murmur heard. Pulses:      Dorsalis pedis pulses are 2+ on the right side and 2+ on the left side.  Respiratory: No respiratory distress. She has decreased breath sounds in the right lower field and the left lower field. She has no wheezes. She has no rhonchi. She has no rales.  GI: Soft. Bowel sounds are normal. There is generalized abdominal tenderness.   Musculoskeletal:     Right ankle: She exhibits no swelling.     Left ankle: She exhibits no swelling.  Lymphadenopathy:    She has no cervical adenopathy.  Neurological: She is alert and oriented to person, place, and time. No cranial nerve deficit.  Skin: Skin is warm. No rash noted. Nails show no clubbing.  Psychiatric: She has a normal mood and affect.      Data Reviewed: Basic Metabolic Panel: Recent Labs  Lab 04/14/19 1032 04/14/19 1649  04/15/19 1325 04/15/19 2215 04/16/19 0351 04/17/19 0436 04/18/19 0032  NA 135  --    < > 136 137 139 136 140  K 2.2* 2.6*   < > 4.6 3.8 3.7 3.4* 3.4*  CL 97*  --    < > 107 111 112* 113* 116*  CO2 27  --    < > 20* 18* 17* 16* 15*  GLUCOSE 113*  --    < > 101* 100* 78 118* 98  BUN 27*  --    < > 35* 32* 31* 58* 68*  CREATININE 1.30*  --    < > 2.38* 2.28* 2.11* 2.91* 3.82*  CALCIUM 9.1  --    < > 8.1* 7.3* 7.3* 6.7* 6.6*  MG 1.9  1.8  --   --   --  1.9 2.0  --   --   PHOS  --  3.0  --   --   --   --   --   --    < > =  values in this interval not displayed.   Liver Function Tests: Recent Labs  Lab 04/14/19 1032 04/16/19 0351 04/17/19 0436 04/18/19 0032  AST 102* 270* 482* 485*  ALT 78* 176* 281* 286*  ALKPHOS 130* 173* 181* 321*  BILITOT 1.1 2.1* 1.2 1.7*  PROT 7.4 5.3* 4.2* 4.4*  ALBUMIN 3.5 2.5* 1.8* 1.8*   Recent Labs  Lab 04/14/19 1032  LIPASE 26   CBC: Recent Labs  Lab 04/14/19 1032 04/15/19 0521 04/16/19 0351 04/17/19 0436 04/17/19 0650 04/17/19 1206 04/17/19 1456 04/17/19 1846 04/18/19 0032 04/18/19 0646  WBC 8.9 8.5 7.7 5.5  --   --   --   --  5.4 7.3  NEUTROABS 7.6  --  6.3 4.2  --   --   --   --   --   --   HGB 11.8* 9.9* 11.6* 7.6* 8.0* 8.0*  --  7.3* 6.6* 8.1*  8.1*  HCT 35.5* 30.7* 36.1 24.9* 24.8* 25.2*  --  22.6* 20.4* 24.3*  24.4*  MCV 93.7 96.2 96.3 101.6*  --   --   --   --  95.3 93.1  PLT 149* 107* 106* 90*  --   --  84*  --  81* 85*   Cardiac Enzymes: Recent Labs  Lab  04/14/19 1032 04/15/19 2215 04/16/19 0351 04/16/19 0946  CKTOTAL  --   --   --  272*  TROPONINI 0.04* <0.03 <0.03 0.03*     Recent Results (from the past 240 hour(s))  SARS Coronavirus 2 (CEPHEID - Performed in [facility] hospital lab), Hosp Order     Status: None   Collection Time: 04/14/19 10:32 AM  Result Value Ref Range Status   SARS Coronavirus 2 NEGATIVE NEGATIVE Final    Comment: (NOTE) If result is NEGATIVE SARS-CoV-2 target nucleic acids are NOT DETECTED. The SARS-CoV-2 RNA is generally detectable in upper and lower  respiratory specimens during the acute phase of infection. The lowest  concentration of SARS-CoV-2 viral copies this assay can detect is 250  copies / mL. A negative result does not preclude SARS-CoV-2 infection  and should not be used as the sole basis for treatment or other  patient management decisions.  A negative result may occur with  improper specimen collection / handling, submission of specimen other  than nasopharyngeal swab, presence of viral mutation(s) within the  areas targeted by this assay, and inadequate number of viral copies  (<250 copies / mL). A negative result must be combined with clinical  observations, patient history, and epidemiological information. If result is POSITIVE SARS-CoV-2 target nucleic acids are DETECTED. The SARS-CoV-2 RNA is generally detectable in upper and lower  respiratory specimens dur ing the acute phase of infection.  Positive  results are indicative of active infection with SARS-CoV-2.  Clinical  correlation with patient history and other diagnostic information is  necessary to determine patient infection status.  Positive results do  not rule out bacterial infection or co-infection with other viruses. If result is PRESUMPTIVE POSTIVE SARS-CoV-2 nucleic acids MAY BE PRESENT.   A presumptive positive result was obtained on the submitted specimen  and confirmed on repeat testing.  While 2019 novel  coronavirus  (SARS-CoV-2) nucleic acids may be present in the submitted sample  additional confirmatory testing may be necessary for epidemiological  and / or clinical management purposes  to differentiate between  SARS-CoV-2 and other Sarbecovirus currently known to infect humans.  If clinically indicated additional testing with an alternate test  methodology [phone]) is advised. The SARS-CoV-2 RNA is generally  detectable in upper and lower respiratory sp ecimens during the acute  phase of infection. The expected result is Negative. Fact Sheet for Patients:  BoilerBrush.com.cy Fact Sheet for Healthcare Providers: https://pope.com/ This test is not yet approved or cleared by the [place] FDA and has been authorized for detection and/or diagnosis of SARS-CoV-2 by FDA under an Emergency Use Authorization (EUA).  This EUA will remain in effect (meaning this test can be used) for the duration of the COVID-19 declaration under Section 564(b)(1) of the Act, 21 U.S.C. section 360bbb-3(b)(1), unless the authorization is terminated or revoked sooner. Performed at [facility], [address] [place], [place] [address]   MRSA PCR Screening     Status: None   Collection Time: 04/14/19  7:22 PM  Result Value Ref Range Status   MRSA by PCR NEGATIVE NEGATIVE Final    Comment:        The GeneXpert MRSA Assay (FDA approved for NASAL specimens only), is one component of a comprehensive MRSA colonization surveillance program. It is not intended to diagnose MRSA infection nor to guide or monitor treatment for MRSA infections. Performed at [facility], [address] [place], [place] [address]   CULTURE, BLOOD (ROUTINE X 2) w Reflex to ID Panel     Status: None (Preliminary result)   Collection Time: 04/16/19  7:47 AM  Result Value Ref Range Status   Specimen Description BLOOD BLOOD RIGHT HAND  Final   Special  Requests   Final    BOTTLES DRAWN AEROBIC AND ANAEROBIC Blood Culture adequate volume   Culture   Final    NO GROWTH 2 DAYS Performed at [facility], [address], [place], [place] [address]    Report Status PENDING  Incomplete  CULTURE, BLOOD (ROUTINE X 2) w Reflex to ID Panel     Status: None (Preliminary result)   Collection Time: 04/16/19  9:27 AM  Result Value Ref Range Status   Specimen Description BLOOD BLOOD RIGHT HAND  Final   Special Requests   Final    BOTTLES DRAWN AEROBIC ONLY Blood Culture adequate volume   Culture   Final    NO GROWTH 2 DAYS Performed at [facility], [address] [place], [place] [address]    Report Status PENDING  Incomplete  MRSA PCR Screening     Status: None   Collection Time: 04/17/19  1:47 PM  Result Value Ref Range Status   MRSA by PCR NEGATIVE NEGATIVE Final    Comment:        The GeneXpert MRSA Assay (FDA approved for NASAL specimens only), is one component of a comprehensive MRSA colonization surveillance program. It is not intended to diagnose MRSA infection nor to guide or monitor treatment for MRSA infections. Performed at [facility], [address] [place], [place] [address]      Studies: Dg Abd 1 View  Result Date: 04/18/2019 CLINICAL DATA:  Right femoral line placement EXAM: ABDOMEN - 1 VIEW COMPARISON:  Dominant CT 3 days ago FINDINGS: Right femoral line. The tip is obscured by lumbar fusion hardware but is likely at the level of the iliac confluence. Mild gaseous distension of bowel, question mild ileus. Probable anasarca seen at the right flank. IMPRESSION: 1. Femoral line with tip obscured by lumbar fusion hardware. The tip is either at the iliac confluence or IVC. 2. Mild generalized gaseous distension of bowel, there may be  developing ileus. Electronically Signed   By: Marnee Spring M.D.   On: 04/18/2019 04:27   US Renal  Result Date: 04/17/2019 CLINICAL DATA:  Acute renal  failure EXAM: RENAL / URINARY TRACT ULTRASOUND COMPLETE COMPARISON:  CT from 04/15/2019 FINDINGS: Right Kidney: Renal measurements: 9.8 x 4.5 x 4.7 cm. = volume: 107 mL . Echogenicity within normal limits. No mass or hydronephrosis visualized. Left Kidney: Renal measurements: 9.7 x 5.1 x 5.6 cm. = volume: 145 mL. Echogenicity within normal limits. No mass or hydronephrosis visualized. Bladder: Appears normal for degree of bladder distention. Increased echogenicity of the liver is noted consistent with fatty infiltration. Minimal free fluid is noted adjacent to the liver. IMPRESSION: Kidneys are within normal limits. Fatty infiltration of the liver. Minimal free fluid in the abdomen. Electronically Signed   By: Alcide Clever M.D.   On: 04/17/2019 21:08    Scheduled Meds: . aspirin  81 mg Oral Daily  . atorvastatin  80 mg Oral Daily  . hydrocortisone sod succinate (SOLU-CORTEF) inj  100 mg Intravenous Q12H  . midodrine  10 mg Oral TID WC  . pantoprazole (PROTONIX) IV  40 mg Intravenous Q12H  . sodium chloride flush  3 mL Intravenous Once  . venlafaxine XR  37.5 mg Oral Q breakfast   Continuous Infusions: . amiodarone 30 mg/hr (04/18/19 0811)  . cefTRIAXone (ROCEPHIN)  IV Stopped (04/18/19 0003)  . metronidazole 500 mg (04/18/19 0800)  . phenylephrine (NEO-SYNEPHRINE) Adult infusion 60 mcg/min (04/18/19 0647)  . potassium chloride    .  sodium bicarbonate  infusion 1000 mL    . sodium chloride      Assessment/Plan:  1. Hypovolemic shock.  Patient on phenylephrine to maintain blood pressure.  Holding antihypertensive medications.  Patient also started on Solu-Cortef 2. Acute blood loss anemia.  Patient was given 1 unit of blood last night.  Hemoglobin came up from 6.6-8.1.  Serial hemoglobins and transfuse as needed. 3. Colitis with hematochezia.  Could be ischemic colitis.  Follow-up with gastroenterology. 4. Atrial fibrillation with rapid ventricular response converted over to normal sinus  rhythm with amiodarone. 5. Acute kidney injury.  Creatinine still rising.  Continue to monitor closely.  Nephrotoxic medications held.  Patient on bicarb drip. 6. Thrombocytopenia continue to monitor blood counts. 7. Elevated liver function test.  Likely with hypotension.  Continue to monitor.  Code Status:     Code Status Orders  (From admission, onward)         Start     Ordered   04/14/19 1533  Full code  Continuous     04/14/19 1532        Code Status History    This patient has a current code status but no historical code status.     Family Communication: Husband on the phone Disposition Plan: To be determined  Consultants:  Critical care specialist  Gastroenterology  Nephrology  Time spent: 25 minutes  Rossy Virag [employer]

## 2019-04-19 ENCOUNTER — Inpatient Hospital Stay: Payer: Medicare Other

## 2019-04-19 DIAGNOSIS — A09 Infectious gastroenteritis and colitis, unspecified: Secondary | ICD-10-CM

## 2019-04-19 LAB — GASTROINTESTINAL PANEL BY PCR, STOOL (REPLACES STOOL CULTURE)

## 2019-04-19 LAB — DIFFERENTIAL
Basophils Absolute: 0 10*3/uL (ref 0.0–0.1)
Basophils Relative: 0 %
Eosinophils Absolute: 0.1 10*3/uL (ref 0.0–0.5)
Eosinophils Relative: 1 %
Lymphocytes Relative: 18 %
Lymphs Abs: 1.7 10*3/uL (ref 0.7–4.0)
Monocytes Absolute: 0.3 10*3/uL (ref 0.1–1.0)
Monocytes Relative: 3 %
Neutro Abs: 7.1 10*3/uL (ref 1.7–7.7)
Neutrophils Relative %: 77 %
Smear Review: DECREASED

## 2019-04-19 LAB — MPO/PR-3 (ANCA) ANTIBODIES
ANCA Proteinase 3: <3.5 U/mL (ref 0.0–3.5)
Myeloperoxidase Abs: <9 U/mL (ref 0.0–9.0)

## 2019-04-19 LAB — CBC
HCT: 28.4 % — ABNORMAL LOW (ref 36.0–46.0)
Hemoglobin: 9.6 g/dL — ABNORMAL LOW (ref 12.0–15.0)
MCH: 30.4 pg (ref 26.0–34.0)
MCHC: 33.8 g/dL (ref 30.0–36.0)
MCV: 89.9 fL (ref 80.0–100.0)
Platelets: 95 10*3/uL — ABNORMAL LOW (ref 150–400)
RBC: 3.16 MIL/uL — ABNORMAL LOW (ref 3.87–5.11)
RDW: 15.3 % (ref 11.5–15.5)
WBC: 9.2 10*3/uL (ref 4.0–10.5)
nRBC: 0 % (ref 0.0–0.2)

## 2019-04-19 LAB — TYPE AND SCREEN
ABO/RH(D): B POS
Antibody Screen: NEGATIVE
Unit division: 0
Unit division: 0
Unit division: 0

## 2019-04-19 LAB — PREPARE RBC (CROSSMATCH)

## 2019-04-19 LAB — COMPREHENSIVE METABOLIC PANEL
ALT: 172 U/L — ABNORMAL HIGH (ref 0–44)
AST: 204 U/L — ABNORMAL HIGH (ref 15–41)
Albumin: 1.6 g/dL — ABNORMAL LOW (ref 3.5–5.0)
Alkaline Phosphatase: 423 U/L — ABNORMAL HIGH (ref 38–126)
Anion gap: 10 (ref 5–15)
BUN: 63 mg/dL — ABNORMAL HIGH (ref 8–23)
CO2: 21 mmol/L — ABNORMAL LOW (ref 22–32)
Calcium: 6.1 mg/dL — CL (ref 8.9–10.3)
Chloride: 106 mmol/L (ref 98–111)
Creatinine, Ser: 4.32 mg/dL — ABNORMAL HIGH (ref 0.44–1.00)
GFR calc Af Amer: 11 mL/min — ABNORMAL LOW (ref >60)
GFR calc non Af Amer: 10 mL/min — ABNORMAL LOW (ref >60)
Glucose, Bld: 149 mg/dL — ABNORMAL HIGH (ref 70–99)
Potassium: 3.7 mmol/L (ref 3.5–5.1)
Sodium: 137 mmol/L (ref 135–145)
Total Bilirubin: 1.1 mg/dL (ref 0.3–1.2)
Total Protein: 3.9 g/dL — ABNORMAL LOW (ref 6.5–8.1)

## 2019-04-19 LAB — PROTEIN ELECTROPHORESIS, SERUM
A/G Ratio: 0.8 (ref 0.7–1.7)
Albumin ELP: 1.8 g/dL — ABNORMAL LOW (ref 2.9–4.4)
Alpha-1-Globulin: 0.3 g/dL (ref 0.0–0.4)
Alpha-2-Globulin: 0.7 g/dL (ref 0.4–1.0)
Beta Globulin: 0.5 g/dL — ABNORMAL LOW (ref 0.7–1.3)
Gamma Globulin: 0.7 g/dL (ref 0.4–1.8)
Globulin, Total: 2.3 g/dL (ref 2.2–3.9)
Total Protein ELP: 4.1 g/dL — ABNORMAL LOW (ref 6.0–8.5)

## 2019-04-19 LAB — HEPATITIS PANEL, ACUTE
HCV Ab: <0.1 s/co ratio (ref 0.0–0.9)
Hep A IgM: NEGATIVE
Hep B C IgM: NEGATIVE
Hepatitis B Surface Ag: NEGATIVE

## 2019-04-19 LAB — C DIFFICILE QUICK SCREEN W PCR REFLEX
C Diff antigen: NEGATIVE
C Diff interpretation: NOT DETECTED
C Diff toxin: NEGATIVE

## 2019-04-19 LAB — BPAM RBC
Blood Product Expiration Date: 202006152359
Blood Product Expiration Date: 202006152359
Blood Product Expiration Date: 202006222359
ISSUE DATE / TIME: 202005260405
Unit Type and Rh: 7300
Unit Type and Rh: 7300
Unit Type and Rh: 7300

## 2019-04-19 LAB — C3 COMPLEMENT: C3 Complement: 85 mg/dL (ref 82–167)

## 2019-04-19 LAB — HSV DNA BY PCR (REFERENCE LAB)
HSV 1 DNA: NEGATIVE
HSV 2 DNA: NEGATIVE

## 2019-04-19 LAB — HEMOGLOBIN AND HEMATOCRIT, BLOOD
HCT: 27.3 % — ABNORMAL LOW (ref 36.0–46.0)
HCT: 28 % — ABNORMAL LOW (ref 36.0–46.0)
Hemoglobin: 9.7 g/dL — ABNORMAL LOW (ref 12.0–15.0)
Hemoglobin: 9.8 g/dL — ABNORMAL LOW (ref 12.0–15.0)

## 2019-04-19 LAB — C4 COMPLEMENT: Complement C4, Body Fluid: 26 mg/dL (ref 14–44)

## 2019-04-19 LAB — GLOMERULAR BASEMENT MEMBRANE ANTIBODIES: GBM Ab: 3 units (ref 0–20)

## 2019-04-19 IMAGING — US US HEPATIC LIVER DOPPLER
1 series · 14 of 25 positions shown · non-contrast
Comparison: None.

CLINICAL DATA: 72-year-old female with liver disease

EXAM:
DUPLEX ULTRASOUND OF LIVER
TECHNIQUE: Color and duplex Doppler ultrasound was performed to evaluate the
hepatic in-flow and out-flow vessels.

[Series 1: us hepatic liver doppler · 14 of 62 slices shown]
[im 1/62]
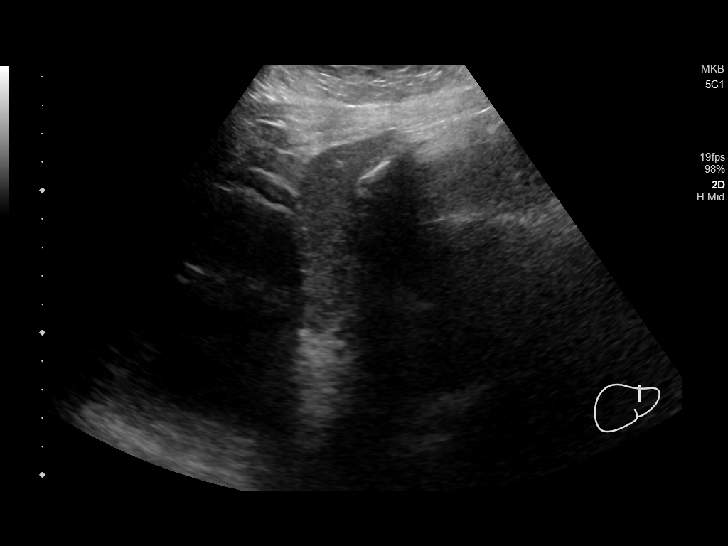
[im 6/62]
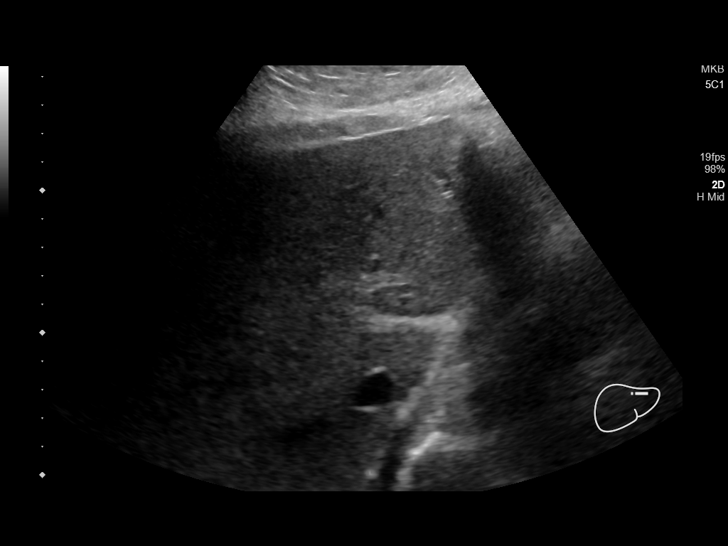
[im 11/62]
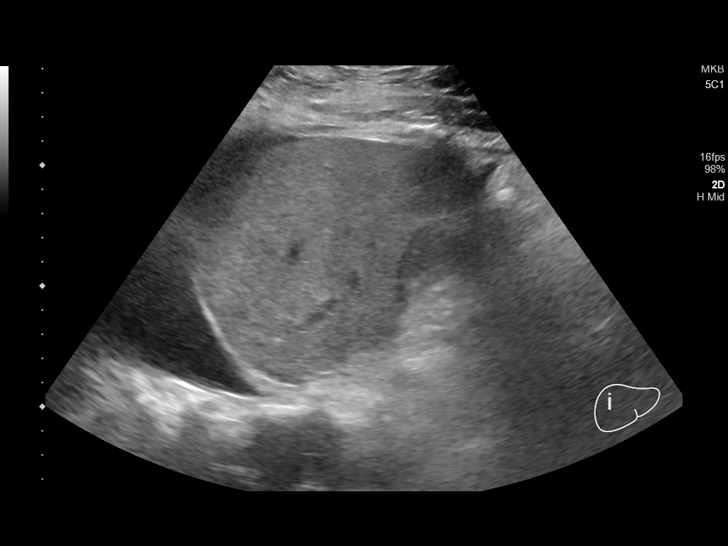
[im 16/62]
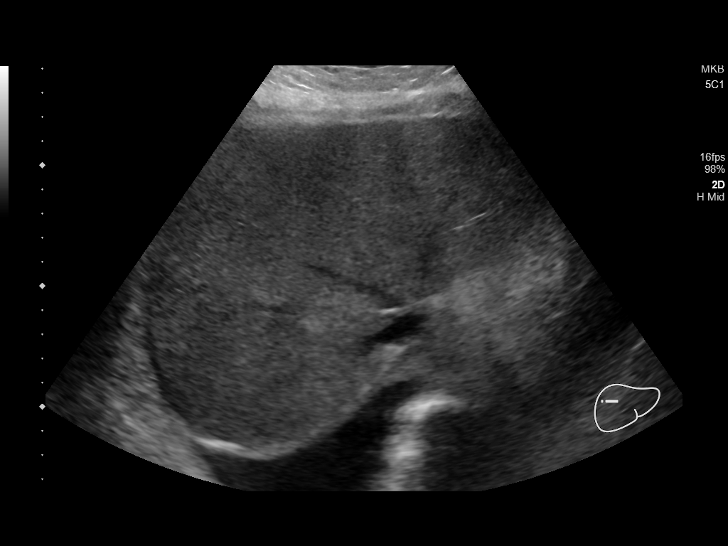
[im 21/62]
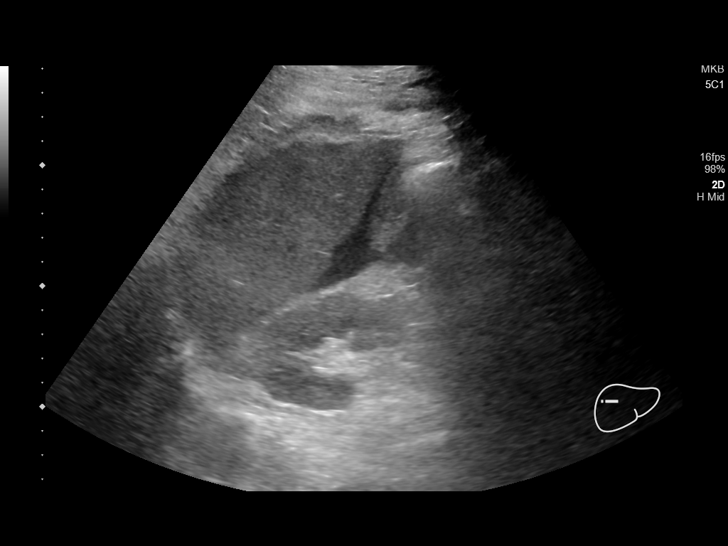
[im 23/62]
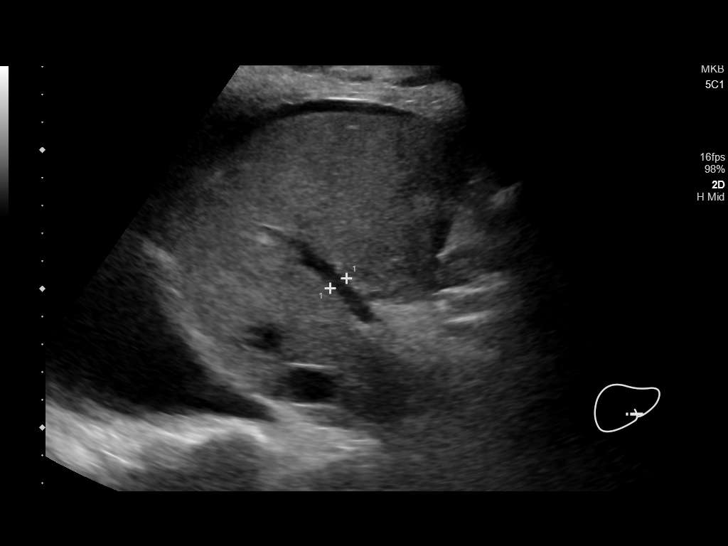
[im 28/62]
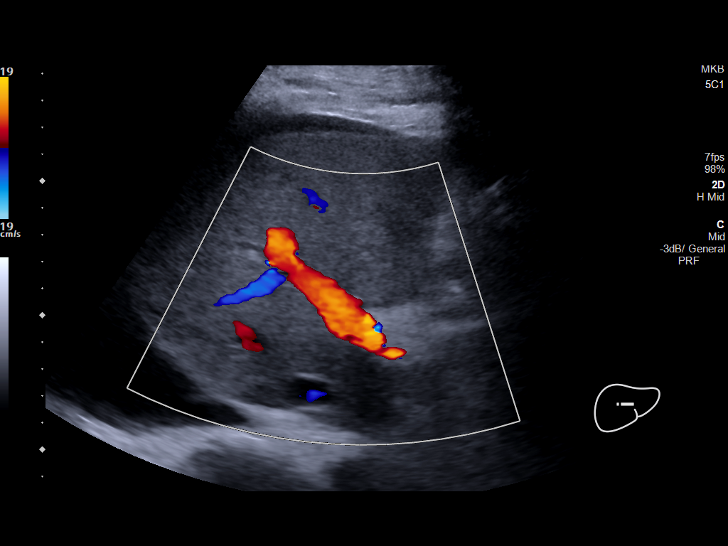
[im 34/62]
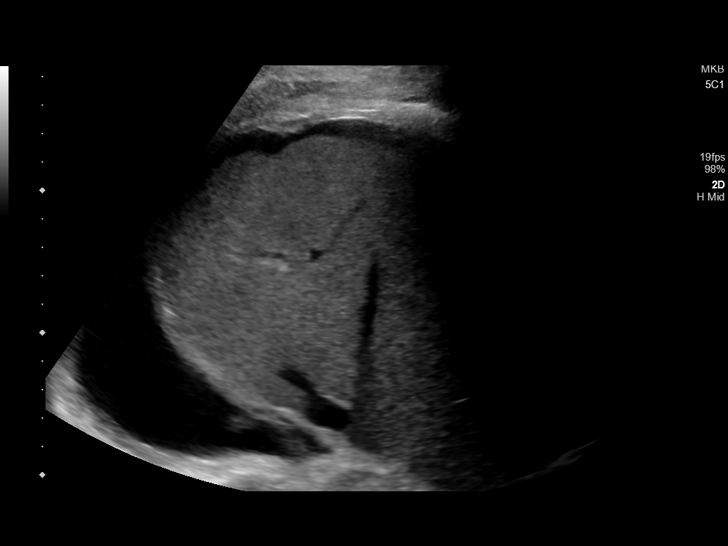
[im 39/62]
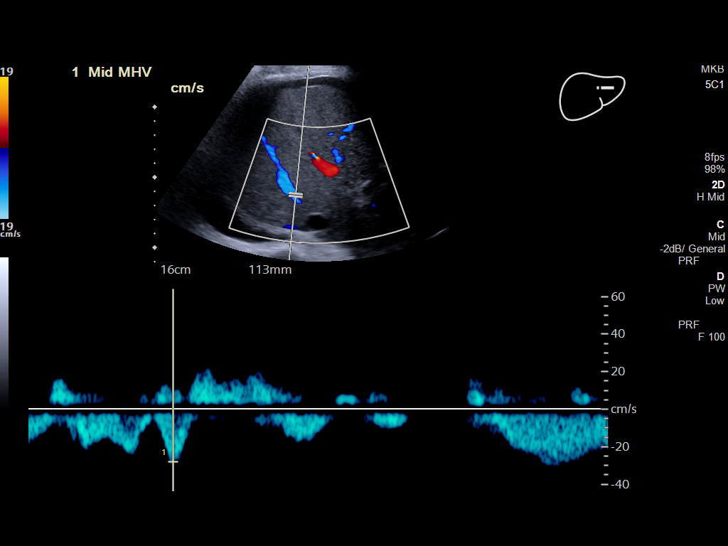
[im 41/62]
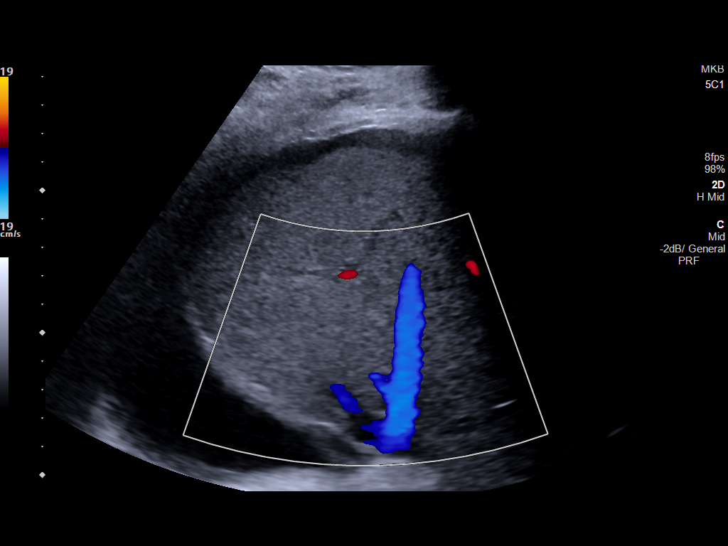
[im 46/62]
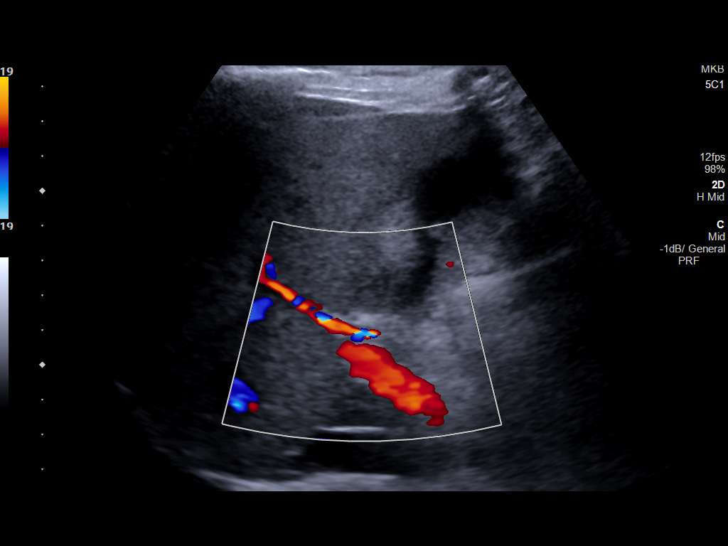
[im 51/62]
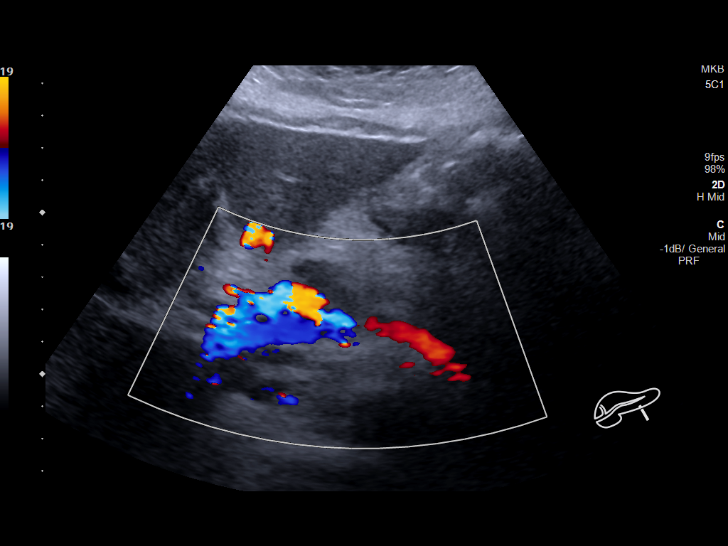
[im 56/62]
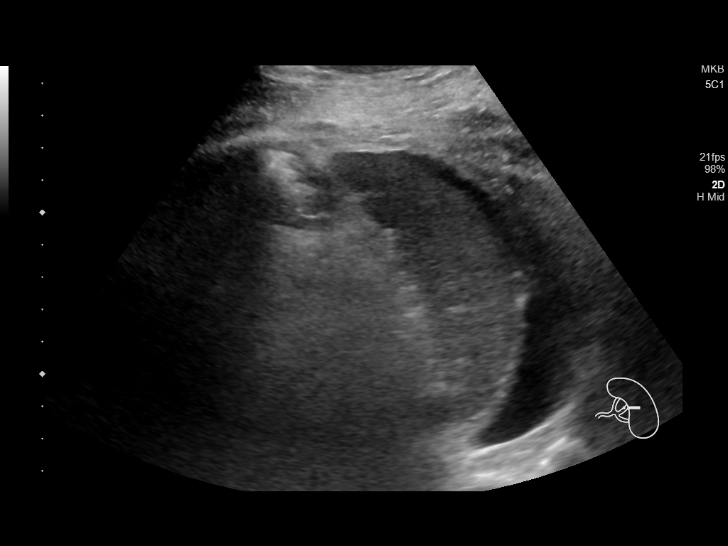
[im 62/62]
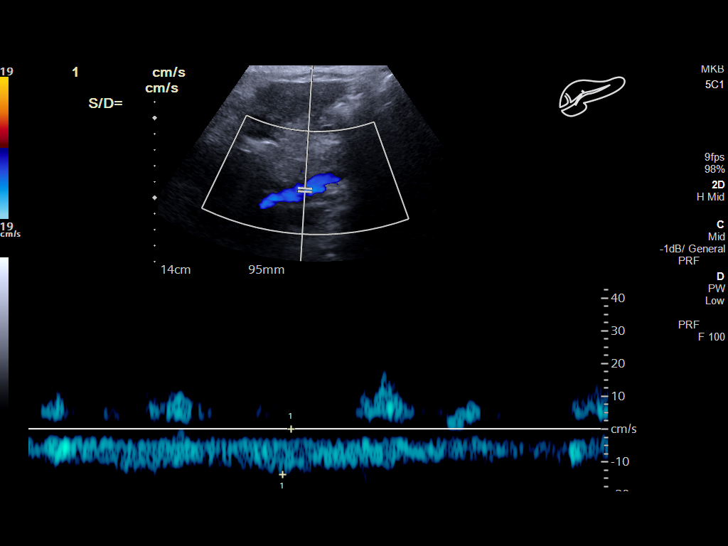

[14 of 25 positions shown; findings below may reference images not displayed]

FINDINGS: Portal Vein Velocities

Main:  45 cm/sec

Right:  30 cm/sec

Left:  23 cm/sec

Hepatic Vein Velocities

Right:  33 cm/sec

Middle:  28 cm/sec

Left:  70 cm/sec

Hepatic Artery Velocity:  90 cm/sec

Splenic Vein Velocity:  13 cm/sec

Varices: Absent

Ascites: Ascites present, scant volume.

Spleen volume 68 cubic cm.

Left-sided pleural effusion.

Mildly nodular contour of liver parenchyma with increased
echogenicity, and no high-resolution images provided.
IMPRESSION: Unremarkable directed duplex of the hepatic vasculature.

Mildly nodular contour of the liver suggesting developing cirrhosis,
though no high-resolution images were performed.

Scant ascites.

## 2019-04-19 MED ORDER — CHLORHEXIDINE GLUCONATE CLOTH 2 % EX PADS
6.0000 | MEDICATED_PAD | Freq: Every day | CUTANEOUS | Status: DC
  Administered 2019-04-19 – 2019-04-22 (×3): 6 via TOPICAL

## 2019-04-19 MED ORDER — SODIUM CHLORIDE 0.9 % IV SOLN
INTRAVENOUS | Status: DC | PRN
  Administered 2019-04-19: 08:00:00 250 mL via INTRAVENOUS
  Administered 2019-04-27: 17:00:00 via INTRAVENOUS
  Administered 2019-04-28: 250 mL via INTRAVENOUS

## 2019-04-19 MED ORDER — CIPROFLOXACIN HCL 500 MG PO TABS
500.0000 mg | ORAL_TABLET | Freq: Two times a day (BID) | ORAL | Status: DC
  Administered 2019-04-19 – 2019-04-20 (×2): 500 mg via ORAL
  Filled 2019-04-19 (×3): qty 1

## 2019-04-19 MED ORDER — SODIUM CHLORIDE 0.9% FLUSH
10.0000 mL | Freq: Two times a day (BID) | INTRAVENOUS | Status: DC
  Administered 2019-04-19 – 2019-04-30 (×18): 10 mL

## 2019-04-19 MED ORDER — AZITHROMYCIN 500 MG PO TABS: 500.0000 mg | ORAL_TABLET | Freq: Every day | ORAL | Status: DC

## 2019-04-19 MED ORDER — SODIUM CHLORIDE 0.9% FLUSH
10.0000 mL | INTRAVENOUS | Status: DC | PRN
  Administered 2019-04-22: 17:00:00 10 mL
  Administered 2019-04-23: 08:00:00 30 mL
  Administered 2019-04-23 (×2): 10 mL
  Administered 2019-04-23: 20 mL
  Filled 2019-04-19 (×5): qty 40

## 2019-04-19 MED ORDER — ALBUMIN HUMAN 25 % IV SOLN
12.5000 g | Freq: Four times a day (QID) | INTRAVENOUS | Status: DC
  Administered 2019-04-19 – 2019-04-22 (×11): 12.5 g via INTRAVENOUS
  Filled 2019-04-19 (×14): qty 50

## 2019-04-19 NOTE — Progress Notes (Signed)
[facility]  ROUNDING NOTE   Subjective:   Patient states her diarrhea improved this morning.   Creatinine 4.32 (3.82)  Objective:  Vital signs in last 24 hours:  Temp:  [96.7 F (35.9 C)-98.3 F (36.8 C)] 96.7 F (35.9 C) (05/27 0800) Pulse Rate:  [49-71] 49 (05/27 1100) Resp:  [14-24] 14 (05/27 1100) BP: (93-164)/(49-110) 118/69 (05/27 1100) SpO2:  [95 %-100 %] 96 % (05/27 1100)  Weight change:  Filed Weights   04/16/19 0010 04/17/19 0301 04/17/19 1246  Weight: 51 kg 55.1 kg 57.4 kg    Intake/Output: I/O last 3 completed shifts: In: 8169 [I.V.:3674.1; Blood:417.1; IV Piggyback:4077.8] Out: 600 [Urine:600]   Intake/Output this shift:  Total I/O In: 403.9 [I.V.:341.8; IV Piggyback:62.2] Out: 280 [Urine:280]  Physical Exam: General: NAD,   Head: Normocephalic, atraumatic. Moist oral mucosal membranes  Eyes: Anicteric, PERRL  Neck: Supple, trachea midline  Lungs:  Clear to auscultation  Heart: Regular rate and rhythm  Abdomen:  Soft, nontender  Extremities:  no peripheral edema.  Neurologic: Nonfocal, moving all four extremities  Skin: No lesions        Basic Metabolic Panel: Recent Labs  Lab 04/14/19 1032 04/14/19 1649  04/15/19 2215 04/16/19 0351 04/17/19 0436 04/18/19 0032 04/19/19 0556  NA 135  --    < > 137 139 136 140 137  K 2.2* 2.6*   < > 3.8 3.7 3.4* 3.4* 3.7  CL 97*  --    < > 111 112* 113* 116* 106  CO2 27  --    < > 18* 17* 16* 15* 21*  GLUCOSE 113*  --    < > 100* 78 118* 98 149*  BUN 27*  --    < > 32* 31* 58* 68* 63*  CREATININE 1.30*  --    < > 2.28* 2.11* 2.91* 3.82* 4.32*  CALCIUM 9.1  --    < > 7.3* 7.3* 6.7* 6.6* 6.1*  MG 1.9  1.8  --   --  1.9 2.0  --   --   --   PHOS  --  3.0  --   --   --   --   --   --    < > = values in this interval not displayed.    Liver Function Tests: Recent Labs  Lab 04/14/19 1032 04/16/19 0351 04/17/19 0436 04/18/19 0032 04/19/19 0556  AST 102* 270* 482* 485* 204*  ALT 78*  176* 281* 286* 172*  ALKPHOS 130* 173* 181* 321* 423*  BILITOT 1.1 2.1* 1.2 1.7* 1.1  PROT 7.4 5.3* 4.2* 4.4* 3.9*  ALBUMIN 3.5 2.5* 1.8* 1.8* 1.6*   Recent Labs  Lab 04/14/19 1032  LIPASE 26   No results for input(s): AMMONIA in the last 168 hours.  CBC: Recent Labs  Lab 04/14/19 1032  04/16/19 0351 04/17/19 0436  04/17/19 1456 04/17/19 1846 04/18/19 0032 04/18/19 0646 04/18/19 1308 04/19/19 0556  WBC 8.9   < > 7.7 5.5  --   --   --  5.4 7.3  --  9.2  NEUTROABS 7.6  --  6.3 4.2  --   --   --   --   --   --   --   HGB 11.8*   < > 11.6* 7.6*   < >  --  7.3* 6.6* 8.1*  8.1* 8.1* 9.6*  HCT 35.5*   < > 36.1 24.9*   < >  --  22.6* 20.4* 24.3*  24.4* 24.1* 28.4*  MCV 93.7   < > 96.3 101.6*  --   --   --  95.3 93.1  --  89.9  PLT 149*   < > 106* 90*  --  84*  --  81* 85*  --  95*   < > = values in this interval not displayed.    Cardiac Enzymes: Recent Labs  Lab 04/14/19 1032 04/15/19 2215 04/16/19 0351 04/16/19 0946  CKTOTAL  --   --   --  272*  TROPONINI 0.04* <0.03 <0.03 0.03*    BNP: Invalid input(s): POCBNP  CBG: No results for input(s): GLUCAP in the last 168 hours.  Microbiology: Results for orders placed or performed during the hospital encounter of 04/14/19  SARS Coronavirus 2 (CEPHEID - Performed in [facility] hospital lab), Hosp Order     Status: None   Collection Time: 04/14/19 10:32 AM  Result Value Ref Range Status   SARS Coronavirus 2 NEGATIVE NEGATIVE Final    Comment: (NOTE) If result is NEGATIVE SARS-CoV-2 target nucleic acids are NOT DETECTED. The SARS-CoV-2 RNA is generally detectable in upper and lower  respiratory specimens during the acute phase of infection. The lowest  concentration of SARS-CoV-2 viral copies this assay can detect is 250  copies / mL. A negative result does not preclude SARS-CoV-2 infection  and should not be used as the sole basis for treatment or other  patient management decisions.  A negative result may occur  with  improper specimen collection / handling, submission of specimen other  than nasopharyngeal swab, presence of viral mutation(s) within the  areas targeted by this assay, and inadequate number of viral copies  (<250 copies / mL). A negative result must be combined with clinical  observations, patient history, and epidemiological information. If result is POSITIVE SARS-CoV-2 target nucleic acids are DETECTED. The SARS-CoV-2 RNA is generally detectable in upper and lower  respiratory specimens dur ing the acute phase of infection.  Positive  results are indicative of active infection with SARS-CoV-2.  Clinical  correlation with patient history and other diagnostic information is  necessary to determine patient infection status.  Positive results do  not rule out bacterial infection or co-infection with other viruses. If result is PRESUMPTIVE POSTIVE SARS-CoV-2 nucleic acids MAY BE PRESENT.   A presumptive positive result was obtained on the submitted specimen  and confirmed on repeat testing.  While 2019 novel coronavirus  (SARS-CoV-2) nucleic acids may be present in the submitted sample  additional confirmatory testing may be necessary for epidemiological  and / or clinical management purposes  to differentiate between  SARS-CoV-2 and other Sarbecovirus currently known to infect humans.  If clinically indicated additional testing with an alternate test  methodology [phone]) is advised. The SARS-CoV-2 RNA is generally  detectable in upper and lower respiratory sp ecimens during the acute  phase of infection. The expected result is Negative. Fact Sheet for Patients:  BoilerBrush.com.cy Fact Sheet for Healthcare Providers: https://pope.com/ This test is not yet approved or cleared by the [place] FDA and has been authorized for detection and/or diagnosis of SARS-CoV-2 by FDA under an Emergency Use Authorization (EUA).  This EUA  will remain in effect (meaning this test can be used) for the duration of the COVID-19 declaration under Section 564(b)(1) of the Act, 21 U.S.C. section 360bbb-3(b)(1), unless the authorization is terminated or revoked sooner. Performed at [facility], [address], [place], [place] [address]   MRSA PCR Screening  Status: None   Collection Time: 04/14/19  7:22 PM  Result Value Ref Range Status   MRSA by PCR NEGATIVE NEGATIVE Final    Comment:        The GeneXpert MRSA Assay (FDA approved for NASAL specimens only), is one component of a comprehensive MRSA colonization surveillance program. It is not intended to diagnose MRSA infection nor to guide or monitor treatment for MRSA infections. Performed at [facility], [address] [place], [place] [address]   CULTURE, BLOOD (ROUTINE X 2) w Reflex to ID Panel     Status: None (Preliminary result)   Collection Time: 04/16/19  7:47 AM  Result Value Ref Range Status   Specimen Description BLOOD BLOOD RIGHT HAND  Final   Special Requests   Final    BOTTLES DRAWN AEROBIC AND ANAEROBIC Blood Culture adequate volume   Culture   Final    NO GROWTH 3 DAYS Performed at [facility], [address] [place], [place] [address]    Report Status PENDING  Incomplete  CULTURE, BLOOD (ROUTINE X 2) w Reflex to ID Panel     Status: None (Preliminary result)   Collection Time: 04/16/19  9:27 AM  Result Value Ref Range Status   Specimen Description BLOOD BLOOD RIGHT HAND  Final   Special Requests   Final    BOTTLES DRAWN AEROBIC ONLY Blood Culture adequate volume   Culture   Final    NO GROWTH 3 DAYS Performed at [facility], [address], [place], [place] [address]    Report Status PENDING  Incomplete  MRSA PCR Screening     Status: None   Collection Time: 04/17/19  1:47 PM  Result Value Ref Range Status   MRSA by PCR NEGATIVE NEGATIVE Final    Comment:        The GeneXpert MRSA Assay  (FDA approved for NASAL specimens only), is one component of a comprehensive MRSA colonization surveillance program. It is not intended to diagnose MRSA infection nor to guide or monitor treatment for MRSA infections. Performed at [facility], [address] [place], [place] [address]   Gastrointestinal Panel by PCR , Stool     Status: Abnormal   Collection Time: 04/17/19 10:42 PM  Result Value Ref Range Status   Campylobacter species NOT DETECTED NOT DETECTED Final   Plesimonas shigelloides NOT DETECTED NOT DETECTED Final   Salmonella species NOT DETECTED NOT DETECTED Final   Yersinia enterocolitica NOT DETECTED NOT DETECTED Final   Vibrio species NOT DETECTED NOT DETECTED Final   Vibrio cholerae NOT DETECTED NOT DETECTED Final   Enteroaggregative E coli (EAEC) NOT DETECTED NOT DETECTED Final   Enteropathogenic E coli (EPEC) DETECTED (A) NOT DETECTED Final    Comment: RESULT CALLED TO, READ BACK BY AND VERIFIED WITH: BETH BUONO AT 0211 04/19/2019 SDR    Enterotoxigenic E coli (ETEC) NOT DETECTED NOT DETECTED Final   Shiga like toxin producing E coli (STEC) NOT DETECTED NOT DETECTED Final   Shigella/Enteroinvasive E coli (EIEC) NOT DETECTED NOT DETECTED Final   Cryptosporidium NOT DETECTED NOT DETECTED Final   Cyclospora cayetanensis NOT DETECTED NOT DETECTED Final   Entamoeba histolytica NOT DETECTED NOT DETECTED Final   Giardia lamblia NOT DETECTED NOT DETECTED Final   Adenovirus F40/41 NOT DETECTED NOT DETECTED Final   Astrovirus NOT DETECTED NOT DETECTED Final   Norovirus GI/GII NOT DETECTED NOT DETECTED Final   Rotavirus A NOT DETECTED NOT DETECTED Final   Sapovirus (I,  II, IV, and V) NOT DETECTED NOT DETECTED Final    Comment: Performed at [facility], [address] [place], [place] [address]  C difficile quick scan w PCR reflex     Status: None   Collection Time: 04/18/19 10:42 PM  Result Value Ref Range Status   C Diff antigen NEGATIVE NEGATIVE  Final   C Diff toxin NEGATIVE NEGATIVE Final   C Diff interpretation No C. difficile detected.  Final    Comment: Performed at [facility], [address] [place], [place] [address]    Coagulation Studies: Recent Labs    04/17/19 1206  LABPROT 13.1  INR 1.0    Urinalysis: No results for input(s): COLORURINE, LABSPEC, PHURINE, GLUCOSEU, HGBUR, BILIRUBINUR, KETONESUR, PROTEINUR, UROBILINOGEN, NITRITE, LEUKOCYTESUR in the last 72 hours.  Invalid input(s): APPERANCEUR    Imaging: Dg Abd 1 View  Result Date: 04/18/2019 CLINICAL DATA:  Right femoral line placement EXAM: ABDOMEN - 1 VIEW COMPARISON:  Dominant CT 3 days ago FINDINGS: Right femoral line. The tip is obscured by lumbar fusion hardware but is likely at the level of the iliac confluence. Mild gaseous distension of bowel, question mild ileus. Probable anasarca seen at the right flank. IMPRESSION: 1. Femoral line with tip obscured by lumbar fusion hardware. The tip is either at the iliac confluence or IVC. 2. Mild generalized gaseous distension of bowel, there may be developing ileus. Electronically Signed   By: Marnee Spring M.D.   On: 04/18/2019 04:27   US Renal  Result Date: 04/17/2019 CLINICAL DATA:  Acute renal failure EXAM: RENAL / URINARY TRACT ULTRASOUND COMPLETE COMPARISON:  CT from 04/15/2019 FINDINGS: Right Kidney: Renal measurements: 9.8 x 4.5 x 4.7 cm. = volume: 107 mL . Echogenicity within normal limits. No mass or hydronephrosis visualized. Left Kidney: Renal measurements: 9.7 x 5.1 x 5.6 cm. = volume: 145 mL. Echogenicity within normal limits. No mass or hydronephrosis visualized. Bladder: Appears normal for degree of bladder distention. Increased echogenicity of the liver is noted consistent with fatty infiltration. Minimal free fluid is noted adjacent to the liver. IMPRESSION: Kidneys are within normal limits. Fatty infiltration of the liver. Minimal free fluid in the abdomen. Electronically Signed   By:  Alcide Clever M.D.   On: 04/17/2019 21:08     Medications:   . sodium chloride 5 mL/hr at 04/19/19 1118  . albumin human    . amiodarone 30 mg/hr (04/18/19 0811)  . cefTRIAXone (ROCEPHIN)  IV Stopped (04/19/19 0041)  . metronidazole Stopped (04/19/19 0737)  . phenylephrine (NEO-SYNEPHRINE) Adult infusion 26.667 mcg/min (04/19/19 0300)  .  sodium bicarbonate  infusion 1000 mL 100 mL/hr at 04/19/19 1118  . sodium chloride     . aspirin  81 mg Oral Daily  . Chlorhexidine Gluconate Cloth  6 each Topical Daily  . digoxin  0.125 mg Oral Daily  . hydrocortisone sod succinate (SOLU-CORTEF) inj  100 mg Intravenous Q12H  . midodrine  10 mg Oral TID WC  . pantoprazole (PROTONIX) IV  40 mg Intravenous Q12H  . sodium chloride flush  10-40 mL Intracatheter Q12H  . sodium chloride flush  3 mL Intravenous Once  . venlafaxine XR  37.5 mg Oral Q breakfast   sodium chloride, meclizine, ondansetron **OR** ondansetron (ZOFRAN) IV, senna-docusate, sodium chloride flush  Assessment/ Plan:  Ms. Sarah Sullivan is a 73 y.o. Asian female with a PMHx of hypertension, degenerative disc disease, lumbar radiculitis, history of depression, osteopenia, who was admitted to [facility] on 04/14/2019  for evaluation of nausea, vomiting, and dizziness.   1.  Acute renal failure with metabolic acidosis: secondary to prerenal azotemia, GI losses and blood losses. ATN. Nonoliguric urine output.  Hematuria and proteinuria on admission.  Creatinine continues to rise. No indication for dialysis at this time.  - Continue sodium bicarbonate infusion.  - Holding home losartan, chlorthalidone and meloxicam.  - Pending serologic work up.  - IV albumin to assist with 3rd spacing.   2. Hypotension with atrial fibrillation. Required vasopressors.  - amiodarone gtt.   3. Anemia with renal failure: status post 1 unit PRBC 5/26.    LOS: 3 Xavian Hardcastle 5/27/202012:49 PM

## 2019-04-19 NOTE — Progress Notes (Signed)
CRITICAL CARE NOTE       SUBJECTIVE FINDINGS & SIGNIFICANT EVENTS    Prognosis is guarded +Enteropathogenic Ecoli Colitis  +US Liver - developing liver cirrosis S/p GI bleeding  PAST MEDICAL HISTORY   Past Medical History:  Diagnosis Date  . Hypertension      SURGICAL HISTORY   Past Surgical History:  Procedure Laterality Date  . ABDOMINAL HYSTERECTOMY       FAMILY HISTORY   History reviewed. No pertinent family history.   SOCIAL HISTORY   Social History   Tobacco Use  . Smoking status: Never Smoker  . Smokeless tobacco: Never Used  Substance Use Topics  . Alcohol use: Not Currently  . Drug use: Not Currently     MEDICATIONS   Current Medication:  Current Facility-Administered Medications:  .  0.9 %  sodium chloride infusion, , Intravenous, PRN, Vida Rigger, MD, Last Rate: 5 mL/hr at 04/19/19 0900 .  acetaminophen (TYLENOL) tablet 650 mg, 650 mg, Oral, Q6H PRN, 650 mg at 04/18/19 0107 **OR** acetaminophen (TYLENOL) suppository 650 mg, 650 mg, Rectal, Q6H PRN, Pyreddy, Pavan, MD .  albumin human 25 % solution 12.5 g, 12.5 g, Intravenous, Q6H, Kolluru, Sarath, MD .  amiodarone (NEXTERONE PREMIX) 360-4.14 MG/200ML-% (1.8 mg/mL) IV infusion, 30 mg/hr, Intravenous, Continuous, Blakeney, Dana G, NP, Last Rate: 16.67 mL/hr at 04/18/19 0811, 30 mg/hr at 04/18/19 0811 .  aspirin chewable tablet 81 mg, 81 mg, Oral, Daily, Pyreddy, Pavan, MD, 81 mg at 04/18/19 0811 .  atorvastatin (LIPITOR) tablet 80 mg, 80 mg, Oral, Daily, Pyreddy, Pavan, MD, 80 mg at 04/18/19 2120 .  cefTRIAXone (ROCEPHIN) 2 g in sodium chloride 0.9 % 100 mL IVPB, 2 g, Intravenous, Q24H, Salena Saner, MD, Stopped at 04/19/19 0041 .  Chlorhexidine Gluconate Cloth 2 % PADS 6 each, 6 each, Topical, Daily, Harlon Ditty D, NP .  digoxin (LANOXIN) tablet 0.125 mg, 0.125 mg, Oral, Daily, Karna Christmas, Gianpaolo Mindel, MD, 0.125 mg at 04/18/19 1741 .  hydrocortisone sodium succinate (SOLU-CORTEF) 100 MG injection 100 mg, 100 mg, Intravenous, Q12H, Karna Christmas, Ardice Boyan, MD, 100 mg at 04/18/19 2351 .  meclizine (ANTIVERT) tablet 12.5 mg, 12.5 mg, Oral, TID PRN, Salena Saner, MD .  metroNIDAZOLE (FLAGYL) IVPB 500 mg, 500 mg, Intravenous, Q8H, Salena Saner, MD, Stopped at 04/19/19 [id number] .  midodrine (PROAMATINE) tablet 10 mg, 10 mg, Oral, TID WC, Axtyn Woehler, MD, 10 mg at 04/19/19 0805 .  ondansetron (ZOFRAN) tablet 4 mg, 4 mg, Oral, Q6H PRN **OR** ondansetron (ZOFRAN) injection 4 mg, 4 mg, Intravenous, Q6H PRN, Pyreddy, Pavan, MD, 4 mg at 04/17/19 1350 .  pantoprazole (PROTONIX) injection 40 mg, 40 mg, Intravenous, Q12H, Vanga, Loel Dubonnet, MD, 40 mg at 04/18/19 2126 .  phenylephrine (NEO-SYNEPHRINE) 40 mg in sodium chloride 0.9 % 250 mL (0.16 mg/mL) infusion, 0-400 mcg/min, Intravenous, Titrated, Blakeney, Dana G, NP, Last Rate: 10 mL/hr at 04/19/19 0300, 26.667 mcg/min at 04/19/19 0300 .  senna-docusate (Senokot-S) tablet 1 tablet, 1 tablet, Oral, QHS PRN, Pyreddy, Pavan, MD .  sodium bicarbonate 150 mEq in dextrose 5 % 1,000 mL infusion, , Intravenous, Continuous, Kolluru, Sarath, MD, Last Rate: 100 mL/hr at 04/19/19 0900 .  sodium chloride 0.9 % bolus 1,000 mL, 1,000 mL, Intravenous, Once, Mayo, Allyn Kenner, MD .  sodium chloride flush (NS) 0.9 % injection 10-40 mL, 10-40 mL, Intracatheter, Q12H, Harlon Ditty D, NP .  sodium chloride flush (NS) 0.9 % injection 10-40 mL, 10-40 mL, Intracatheter, PRN, Judithe Modest,  NP .  sodium chloride flush (NS) 0.9 % injection 3 mL, 3 mL, Intravenous, Once, Jene Every, MD .  venlafaxine XR [facility]) 24 hr capsule 37.5 mg, 37.5 mg, Oral, Q breakfast, Pyreddy, Pavan, MD, 37.5 mg at 04/19/19 0805    ALLERGIES   Patient has no known allergies.    REVIEW OF  SYSTEMS   10 pont ROS neg except as per HPI   PHYSICAL EXAMINATION   Vitals:   04/19/19 0800 04/19/19 0900  BP: (!) 105/49 (!) 103/55  Pulse: (!) 53 (!) 51  Resp: 16 16  Temp: (!) 96.7 F (35.9 C)   SpO2: 99% 95%    GENERAL:midl distress due to Nausea HEAD: Normocephalic, atraumatic.  EYES: Pupils equal, round, reactive to light.  No scleral icterus.  MOUTH: Moist mucosal membrane. NECK: Supple. No thyromegaly. No nodules. No JVD.  PULMONARY: CTAB  CARDIOVASCULAR: S1 and S2. Regular rate and rhythm. No murmurs, rubs, or gallops.  GASTROINTESTINAL: Soft, nontender, non-distended. No masses. Positive bowel sounds. No hepatosplenomegaly.  MUSCULOSKELETAL: No swelling, clubbing, or edema.  NEUROLOGIC: Mild distress due to acute illness SKIN:intact,warm,dry   LABS AND IMAGING     LAB RESULTS: Recent Labs  Lab 04/17/19 0436 04/18/19 0032 04/19/19 0556  NA 136 140 137  K 3.4* 3.4* 3.7  CL 113* 116* 106  CO2 16* 15* 21*  BUN 58* 68* 63*  CREATININE 2.91* 3.82* 4.32*  GLUCOSE 118* 98 149*   Recent Labs  Lab 04/18/19 0032 04/18/19 0646 04/18/19 1308 04/19/19 0556  HGB 6.6* 8.1*  8.1* 8.1* 9.6*  HCT 20.4* 24.3*  24.4* 24.1* 28.4*  WBC 5.4 7.3  --  9.2  PLT 81* 85*  --  95*     IMAGING RESULTS: No results found.   CLINICAL DATA:  73 year old female with liver disease  EXAM: DUPLEX ULTRASOUND OF LIVER  TECHNIQUE: Color and duplex Doppler ultrasound was performed to evaluate the hepatic in-flow and out-flow vessels.  COMPARISON:  None.  FINDINGS: Portal Vein Velocities  Main:  45 cm/sec  Right:  30 cm/sec  Left:  23 cm/sec  Hepatic Vein Velocities  Right:  33 cm/sec  Middle:  28 cm/sec  Left:  70 cm/sec  Hepatic Artery Velocity:  90 cm/sec  Splenic Vein Velocity:  13 cm/sec  Varices: Absent  Ascites: Ascites present, scant volume.  Spleen volume 68 cubic cm.  Left-sided pleural effusion.  Mildly nodular  contour of liver parenchyma with increased echogenicity, and no high-resolution images provided.  IMPRESSION: Unremarkable directed duplex of the hepatic vasculature.  Mildly nodular contour of the liver suggesting developing cirrhosis, though no high-resolution images were performed.  Scant ascites.     ASSESSMENT AND PLAN    -Multidisciplinary rounds held today  Hypovolemic shock     Resolved - likely due to hematochezia   -s/p fluid resuscitation  -Blood transfusion received -Neo-Synephrine stopped -ICU monitoring     Acute blood loss anemia    -Status post PRBC transfusion monitoring H&H   -GI consultation placed appreciate input   -Protonix 40 twice daily   -We will check peripheral blood film for schistocytes looking for possible HUS/TTP -Endoscopy as an outpatient -had 3 bloody BMs overnight      Transaminitis    -N.p.o. post midnight for liver ultrasound   -DC hepatotoxic medications   -Evaluate serology for EBV, CMV, HSV, VZV, -s/p liver US - developing liver cirrhosis   Colitis -Resolved -C. difficile and GI panel negative -on rocephin and flagyl -  today is day 3     Enteropathogenic Ecoli infection    Acute renal failure KDIGO stage 3 with metabolic acidosis: Nephrology on case appreciate input -   secondary to prerenal azotemia, GI losses and blood losses. ATN. Nonoliguric urine output.  Hematuria and proteinuria on admission.  Creatinine continues to rise.  - Change to sodium bicarbonate infusion.  - Holding home losartan, chlorthalidone and meloxicam.  - Pending serologic work up.    ID -continue IV abx as prescibed -follow up cultures  GI/Nutrition GI PROPHYLAXIS as indicated DIET-->TF's as tolerated Constipation protocol as indicated  ENDO - ICU hypoglycemic\Hyperglycemia protocol -check FSBS per protocol   ELECTROLYTES -follow labs as needed -replace as needed -pharmacy consultation    DVT/GI PRX ordered -SCDs  TRANSFUSIONS AS NEEDED MONITOR FSBS ASSESS the need for LABS as needed   Critical care provider statement:   Critical care time (minutes): 35  Critical care time was exclusive of: Separately billable procedures and treating other patients  Critical care was necessary to treat or prevent imminent or life-threatening deterioration of the following conditions:  Acute blood loss anemia, hypovolemic shock, colitis, acute kidney injury, transaminitis, multiple comorbid conditions  Critical care was time spent personally by me on the following activities: Development of treatment plan with patient or surrogate, discussions with consultants, evaluation of patient's response to treatment, examination of patient, obtaining history from patient or surrogate, ordering and performing treatments and interventions, ordering and review of laboratory studies and re-evaluation of patient's condition.  I assumed direction of critical care for this patient from another provider in my specialty: no   This document was prepared using Dragon voice recognition software and may include unintentional dictation errors.    Vida RiggerFuad Averianna Brugger, M.D.  Division of Pulmonary & Critical Care Medicine  [facility]

## 2019-04-19 NOTE — Progress Notes (Signed)
1140 hrs: Updated patient's spouse, Chrissie Noa.  1155 hrs: Patient off unit and off monitor for U/S.

## 2019-04-19 NOTE — Progress Notes (Signed)
Pharmacy Electrolyte Monitoring Consult:  Pharmacy consulted to assist in monitoring and replacing electrolytes in this 73 y.o. female admitted on 04/14/2019 with Nausea and Emesis   Labs:  Sodium (mmol/L)  Date Value  04/19/2019 137  12/25/2014 141   Potassium (mmol/L)  Date Value  04/19/2019 3.7  12/25/2014 3.5   Magnesium (mg/dL)  Date Value  46/50/3546 2.0   Phosphorus (mg/dL)  Date Value  56/81/2751 3.0   Calcium (mg/dL)  Date Value  70/11/7492 6.1 (LL)   Calcium, Total (mg/dL)  Date Value  49/67/5916 9.2   Albumin (g/dL)  Date Value  38/46/6599 1.6 (L)    Assessment/Plan: Patient received total of 140 mEq of IV and oral potassium replacement on 5/22 on top of receiving NS/40K as MIVF.  Patient in acute renal failure, serum creatinine continues upward trend.   5/27:  K 3.7  Scr 4.32  Scr increased.   No further supplementation at this time.   Electrolytes with am labs.   Pharmacy will continue to monitor and adjust per consult.   Clovia Cuff, PharmD, BCPS 04/19/2019 5:12 PM

## 2019-04-19 NOTE — Plan of Care (Signed)

## 2019-04-19 NOTE — Progress Notes (Signed)
Patient back on unit from US.

## 2019-04-19 NOTE — Progress Notes (Signed)
Patient ID: Sarah Sullivan, female   DOB: 05/14/1946, 73 y.o.   MRN: [id number]  [facility] PROGRESS NOTE  Sarah Sullivan WUJ:[id number] DOB: 05/14/1946 DOA: 04/14/2019 PCP: Titus MouldWhite, Elizabeth Burney, NP  HPI/Subjective: Patient feeling a little bit better today.  Patient states that she did not have any bowel movements today when I saw her.  No nausea or vomiting.  Some abdominal pain.  Objective: Vitals:   04/19/19 1300 04/19/19 1400  BP: (!) 115/56 (!) 127/56  Pulse: (!) 54 (!) 50  Resp: 17 18  Temp:    SpO2: 96% 95%    Filed Weights   04/16/19 0010 04/17/19 0301 04/17/19 1246  Weight: 51 kg 55.1 kg 57.4 kg    ROS: Review of Systems  Constitutional: Negative for chills and fever.  Eyes: Negative for blurred vision.  Respiratory: Negative for cough and shortness of breath.   Cardiovascular: Negative for chest pain.  Gastrointestinal: Positive for abdominal pain. Negative for constipation, diarrhea, nausea and vomiting.  Genitourinary: Negative for dysuria.  Musculoskeletal: Negative for joint pain.  Neurological: Negative for dizziness and headaches.   Exam: Physical Exam  Constitutional: She is oriented to person, place, and time.  HENT:  Nose: No mucosal edema.  Mouth/Throat: No oropharyngeal exudate or posterior oropharyngeal edema.  Eyes: Pupils are equal, round, and reactive to light. Conjunctivae, EOM and lids are normal.  Neck: No JVD present. Carotid bruit is not present. No edema present. No thyroid mass and no thyromegaly present.  Cardiovascular: S1 normal and S2 normal. Exam reveals no gallop.  No murmur heard. Pulses:      Dorsalis pedis pulses are 2+ on the right side and 2+ on the left side.  Respiratory: No respiratory distress. She has decreased breath sounds in the right lower field and the left lower field. She has no wheezes. She has no rhonchi. She has no rales.  GI: Soft. Bowel sounds are normal. There is generalized abdominal tenderness.   Musculoskeletal:     Right ankle: She exhibits no swelling.     Left ankle: She exhibits no swelling.  Lymphadenopathy:    She has no cervical adenopathy.  Neurological: She is alert and oriented to person, place, and time. No cranial nerve deficit.  Skin: Skin is warm. No rash noted. Nails show no clubbing.  Psychiatric: She has a normal mood and affect.      Data Reviewed: Basic Metabolic Panel: Recent Labs  Lab 04/14/19 1032 04/14/19 1649  04/15/19 2215 04/16/19 0351 04/17/19 0436 04/18/19 0032 04/19/19 0556  NA 135  --    < > 137 139 136 140 137  K 2.2* 2.6*   < > 3.8 3.7 3.4* 3.4* 3.7  CL 97*  --    < > 111 112* 113* 116* 106  CO2 27  --    < > 18* 17* 16* 15* 21*  GLUCOSE 113*  --    < > 100* 78 118* 98 149*  BUN 27*  --    < > 32* 31* 58* 68* 63*  CREATININE 1.30*  --    < > 2.28* 2.11* 2.91* 3.82* 4.32*  CALCIUM 9.1  --    < > 7.3* 7.3* 6.7* 6.6* 6.1*  MG 1.9  1.8  --   --  1.9 2.0  --   --   --   PHOS  --  3.0  --   --   --   --   --   --    < > =  values in this interval not displayed.   Liver Function Tests: Recent Labs  Lab 04/14/19 1032 04/16/19 0351 04/17/19 0436 04/18/19 0032 04/19/19 0556  AST 102* 270* 482* 485* 204*  ALT 78* 176* 281* 286* 172*  ALKPHOS 130* 173* 181* 321* 423*  BILITOT 1.1 2.1* 1.2 1.7* 1.1  PROT 7.4 5.3* 4.2* 4.4* 3.9*  ALBUMIN 3.5 2.5* 1.8* 1.8* 1.6*   Recent Labs  Lab 04/14/19 1032  LIPASE 26   CBC: Recent Labs  Lab 04/14/19 1032  04/16/19 0351 04/17/19 0436  04/17/19 1456  04/18/19 0032 04/18/19 0646 04/18/19 1308 04/19/19 0556 04/19/19 1423  WBC 8.9   < > 7.7 5.5  --   --   --  5.4 7.3  --  9.2  --   NEUTROABS 7.6  --  6.3 4.2  --   --   --   --   --   --   --   --   HGB 11.8*   < > 11.6* 7.6*   < >  --    < > 6.6* 8.1*  8.1* 8.1* 9.6* 9.7*  HCT 35.5*   < > 36.1 24.9*   < >  --    < > 20.4* 24.3*  24.4* 24.1* 28.4* 27.3*  MCV 93.7   < > 96.3 101.6*  --   --   --  95.3 93.1  --  89.9  --   PLT 149*   <  > 106* 90*  --  84*  --  81* 85*  --  95*  --    < > = values in this interval not displayed.   Cardiac Enzymes: Recent Labs  Lab 04/14/19 1032 04/15/19 2215 04/16/19 0351 04/16/19 0946  CKTOTAL  --   --   --  272*  TROPONINI 0.04* <0.03 <0.03 0.03*     Recent Results (from the past 240 hour(s))  SARS Coronavirus 2 (CEPHEID - Performed in [facility] hospital lab), Hosp Order     Status: None   Collection Time: 04/14/19 10:32 AM  Result Value Ref Range Status   SARS Coronavirus 2 NEGATIVE NEGATIVE Final    Comment: (NOTE) If result is NEGATIVE SARS-CoV-2 target nucleic acids are NOT DETECTED. The SARS-CoV-2 RNA is generally detectable in upper and lower  respiratory specimens during the acute phase of infection. The lowest  concentration of SARS-CoV-2 viral copies this assay can detect is 250  copies / mL. A negative result does not preclude SARS-CoV-2 infection  and should not be used as the sole basis for treatment or other  patient management decisions.  A negative result may occur with  improper specimen collection / handling, submission of specimen other  than nasopharyngeal swab, presence of viral mutation(s) within the  areas targeted by this assay, and inadequate number of viral copies  (<250 copies / mL). A negative result must be combined with clinical  observations, patient history, and epidemiological information. If result is POSITIVE SARS-CoV-2 target nucleic acids are DETECTED. The SARS-CoV-2 RNA is generally detectable in upper and lower  respiratory specimens dur ing the acute phase of infection.  Positive  results are indicative of active infection with SARS-CoV-2.  Clinical  correlation with patient history and other diagnostic information is  necessary to determine patient infection status.  Positive results do  not rule out bacterial infection or co-infection with other viruses. If result is PRESUMPTIVE POSTIVE SARS-CoV-2 nucleic acids MAY BE PRESENT.    A presumptive positive result  was obtained on the submitted specimen  and confirmed on repeat testing.  While 2019 novel coronavirus  (SARS-CoV-2) nucleic acids may be present in the submitted sample  additional confirmatory testing may be necessary for epidemiological  and / or clinical management purposes  to differentiate between  SARS-CoV-2 and other Sarbecovirus currently known to infect humans.  If clinically indicated additional testing with an alternate test  methodology [phone]) is advised. The SARS-CoV-2 RNA is generally  detectable in upper and lower respiratory sp ecimens during the acute  phase of infection. The expected result is Negative. Fact Sheet for Patients:  BoilerBrush.com.cy Fact Sheet for Healthcare Providers: https://pope.com/ This test is not yet approved or cleared by the [place] FDA and has been authorized for detection and/or diagnosis of SARS-CoV-2 by FDA under an Emergency Use Authorization (EUA).  This EUA will remain in effect (meaning this test can be used) for the duration of the COVID-19 declaration under Section 564(b)(1) of the Act, 21 U.S.C. section 360bbb-3(b)(1), unless the authorization is terminated or revoked sooner. Performed at [facility], [address] [place], [place] [address]   MRSA PCR Screening     Status: None   Collection Time: 04/14/19  7:22 PM  Result Value Ref Range Status   MRSA by PCR NEGATIVE NEGATIVE Final    Comment:        The GeneXpert MRSA Assay (FDA approved for NASAL specimens only), is one component of a comprehensive MRSA colonization surveillance program. It is not intended to diagnose MRSA infection nor to guide or monitor treatment for MRSA infections. Performed at [facility], [address] [place], [place] [address]   CULTURE, BLOOD (ROUTINE X 2) w Reflex to ID Panel     Status: None (Preliminary result)   Collection  Time: 04/16/19  7:47 AM  Result Value Ref Range Status   Specimen Description BLOOD BLOOD RIGHT HAND  Final   Special Requests   Final    BOTTLES DRAWN AEROBIC AND ANAEROBIC Blood Culture adequate volume   Culture   Final    NO GROWTH 3 DAYS Performed at [facility], [address], [place], [place] [address]    Report Status PENDING  Incomplete  CULTURE, BLOOD (ROUTINE X 2) w Reflex to ID Panel     Status: None (Preliminary result)   Collection Time: 04/16/19  9:27 AM  Result Value Ref Range Status   Specimen Description BLOOD BLOOD RIGHT HAND  Final   Special Requests   Final    BOTTLES DRAWN AEROBIC ONLY Blood Culture adequate volume   Culture   Final    NO GROWTH 3 DAYS Performed at [facility], [address], [place], [place] [address]    Report Status PENDING  Incomplete  MRSA PCR Screening     Status: None   Collection Time: 04/17/19  1:47 PM  Result Value Ref Range Status   MRSA by PCR NEGATIVE NEGATIVE Final    Comment:        The GeneXpert MRSA Assay (FDA approved for NASAL specimens only), is one component of a comprehensive MRSA colonization surveillance program. It is not intended to diagnose MRSA infection nor to guide or monitor treatment for MRSA infections. Performed at [facility], [address] [place], [place] [address]   Gastrointestinal Panel by PCR , Stool     Status: Abnormal   Collection Time: 04/17/19 10:42 PM  Result Value Ref Range Status   Campylobacter species NOT  DETECTED NOT DETECTED Final   Plesimonas shigelloides NOT DETECTED NOT DETECTED Final   Salmonella species NOT DETECTED NOT DETECTED Final   Yersinia enterocolitica NOT DETECTED NOT DETECTED Final   Vibrio species NOT DETECTED NOT DETECTED Final   Vibrio cholerae NOT DETECTED NOT DETECTED Final   Enteroaggregative E coli (EAEC) NOT DETECTED NOT DETECTED Final   Enteropathogenic E coli (EPEC) DETECTED (A) NOT DETECTED Final    Comment: RESULT  CALLED TO, READ BACK BY AND VERIFIED WITH: BETH BUONO AT 0211 04/19/2019 SDR    Enterotoxigenic E coli (ETEC) NOT DETECTED NOT DETECTED Final   Shiga like toxin producing E coli (STEC) NOT DETECTED NOT DETECTED Final   Shigella/Enteroinvasive E coli (EIEC) NOT DETECTED NOT DETECTED Final   Cryptosporidium NOT DETECTED NOT DETECTED Final   Cyclospora cayetanensis NOT DETECTED NOT DETECTED Final   Entamoeba histolytica NOT DETECTED NOT DETECTED Final   Giardia lamblia NOT DETECTED NOT DETECTED Final   Adenovirus F40/41 NOT DETECTED NOT DETECTED Final   Astrovirus NOT DETECTED NOT DETECTED Final   Norovirus GI/GII NOT DETECTED NOT DETECTED Final   Rotavirus A NOT DETECTED NOT DETECTED Final   Sapovirus (I, II, IV, and V) NOT DETECTED NOT DETECTED Final    Comment: Performed at [facility], [address] [place], [place] [address]  C difficile quick scan w PCR reflex     Status: None   Collection Time: 04/18/19 10:42 PM  Result Value Ref Range Status   C Diff antigen NEGATIVE NEGATIVE Final   C Diff toxin NEGATIVE NEGATIVE Final   C Diff interpretation No C. difficile detected.  Final    Comment: Performed at [facility], [address] [place], [place] [address]     Studies: Dg Abd 1 View  Result Date: 04/18/2019 CLINICAL DATA:  Right femoral line placement EXAM: ABDOMEN - 1 VIEW COMPARISON:  Dominant CT 3 days ago FINDINGS: Right femoral line. The tip is obscured by lumbar fusion hardware but is likely at the level of the iliac confluence. Mild gaseous distension of bowel, question mild ileus. Probable anasarca seen at the right flank. IMPRESSION: 1. Femoral line with tip obscured by lumbar fusion hardware. The tip is either at the iliac confluence or IVC. 2. Mild generalized gaseous distension of bowel, there may be developing ileus. Electronically Signed   By: Marnee Spring M.D.   On: 04/18/2019 04:27   US Renal  Result Date: 04/17/2019 CLINICAL DATA:  Acute  renal failure EXAM: RENAL / URINARY TRACT ULTRASOUND COMPLETE COMPARISON:  CT from 04/15/2019 FINDINGS: Right Kidney: Renal measurements: 9.8 x 4.5 x 4.7 cm. = volume: 107 mL . Echogenicity within normal limits. No mass or hydronephrosis visualized. Left Kidney: Renal measurements: 9.7 x 5.1 x 5.6 cm. = volume: 145 mL. Echogenicity within normal limits. No mass or hydronephrosis visualized. Bladder: Appears normal for degree of bladder distention. Increased echogenicity of the liver is noted consistent with fatty infiltration. Minimal free fluid is noted adjacent to the liver. IMPRESSION: Kidneys are within normal limits. Fatty infiltration of the liver. Minimal free fluid in the abdomen. Electronically Signed   By: Alcide Clever M.D.   On: 04/17/2019 21:08   US Liver Doppler  Result Date: 04/19/2019 CLINICAL DATA:  73 year old female with liver disease EXAM: DUPLEX ULTRASOUND OF LIVER TECHNIQUE: Color and duplex Doppler ultrasound was performed to evaluate the hepatic in-flow and out-flow vessels. COMPARISON:  None. FINDINGS: Portal Vein Velocities Main:  45 cm/sec Right:  30 cm/sec  Left:  23 cm/sec Hepatic Vein Velocities Right:  33 cm/sec Middle:  28 cm/sec Left:  70 cm/sec Hepatic Artery Velocity:  90 cm/sec Splenic Vein Velocity:  13 cm/sec Varices: Absent Ascites: Ascites present, scant volume. Spleen volume 68 cubic cm. Left-sided pleural effusion. Mildly nodular contour of liver parenchyma with increased echogenicity, and no high-resolution images provided. IMPRESSION: Unremarkable directed duplex of the hepatic vasculature. Mildly nodular contour of the liver suggesting developing cirrhosis, though no high-resolution images were performed. Scant ascites. Electronically Signed   By: Gilmer Mor D.O.   On: 04/19/2019 13:57    Scheduled Meds: . aspirin  81 mg Oral Daily  . Chlorhexidine Gluconate Cloth  6 each Topical Daily  . ciprofloxacin  500 mg Oral BID  . digoxin  0.125 mg Oral Daily  .  hydrocortisone sod succinate (SOLU-CORTEF) inj  100 mg Intravenous Q12H  . midodrine  10 mg Oral TID WC  . pantoprazole (PROTONIX) IV  40 mg Intravenous Q12H  . sodium chloride flush  10-40 mL Intracatheter Q12H  . sodium chloride flush  3 mL Intravenous Once  . venlafaxine XR  37.5 mg Oral Q breakfast   Continuous Infusions: . sodium chloride 5 mL/hr at 04/19/19 1400  . albumin human Stopped (04/19/19 1351)  . amiodarone 30 mg/hr (04/18/19 0811)  . phenylephrine (NEO-SYNEPHRINE) Adult infusion 26.667 mcg/min (04/19/19 0300)  .  sodium bicarbonate  infusion 1000 mL 100 mL/hr at 04/19/19 1400  . sodium chloride      Assessment/Plan:  1. Hypovolemic shock.  Holding antihypertensive medications.  Patient also started on Solu-Cortef.  Pressor support if needed 2. 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  Antibiotics switched over to Cipro. 3. Acute kidney injury.  Likely secondary to hypovolemic shock and GI losses.  Creatinine continue to rise on a daily basis but seems to be leveling off.  Nephrology following.  Patient on bicarbonate drip.  Could be HUS with enteropathogenic E. coli infection. 4. Acute blood loss anemia.  Patient was given 1 unit of blood 2 nights ago.  Hemoglobin trending higher.  5. Atrial fibrillation with rapid ventricular response converted over to normal sinus rhythm with amiodarone 6. Thrombocytopenia continue to monitor blood counts. 7. Elevated liver function test.  Likely with hypotension.  Continue to monitor.  Hepatitis panel negative.  Ultrasound concerning for early cirrhosis even though CAT scan did not comment on this.  Code Status:     Code Status Orders  (From admission, onward)         Start     Ordered   04/14/19 1533  Full code  Continuous     04/14/19 1532        Code Status History    This patient has a current code status but no historical code status.     Family Communication: Husband on the  phone Disposition Plan: To be determined  Consultants:  Critical care specialist  Gastroenterology  Nephrology  Time spent: 29 minutes  Karin Pinedo [employer]

## 2019-04-19 NOTE — Progress Notes (Signed)
Shift summary: patient remains in SDU; condition stable but guarded. Began IV Albumin today. Foley and CVC remain in place. S/P Liver U/S today. May need H/D beginning tomorrow.

## 2019-04-19 NOTE — Progress Notes (Signed)
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   Subjective: Still having diarrhea    Objective: Vital signs in last 24 hours: Vitals:   04/19/19 1000 04/19/19 1100 04/19/19 1255 04/19/19 1300  BP: (!) 121/58 118/69 (!) 120/44 (!) 115/56  Pulse: (!) 49 (!) 49 (!) 55 (!) 54  Resp: 15 14 17 17   Temp:   (!) 97.4 F (36.3 C)   TempSrc:   Oral   SpO2: 96% 96% 96% 96%  Weight:      Height:       Weight change:   Intake/Output Summary (Last 24 hours) at 04/19/2019 1348 Last data filed at 04/19/2019 1118 Gross per 24 hour  Intake 2728.59 ml  Output 505 ml  Net 2223.59 ml     Exam: Heart:: Regular rate and rhythm, S1S2 present or without murmur or extra heart sounds Lungs: normal, clear to auscultation and clear to auscultation and percussion Abdomen: soft, nontender, normal bowel sounds   Lab Results: @LABTEST2 @ Micro Results: Recent Results (from the past 240 hour(s))  SARS Coronavirus 2 (CEPHEID - Performed in [facility] hospital lab), Hosp Order     Status: None   Collection Time: 04/14/19 10:32 AM  Result Value Ref Range Status   SARS Coronavirus 2 NEGATIVE NEGATIVE Final    Comment: (NOTE) If result is NEGATIVE SARS-CoV-2 target nucleic acids are NOT DETECTED. The SARS-CoV-2 RNA is generally detectable in upper and lower  respiratory specimens during the acute phase of infection. The lowest  concentration of SARS-CoV-2 viral copies this assay can detect is 250  copies / mL. A negative result does not preclude SARS-CoV-2 infection  and should not be used as the sole basis for treatment or other  patient management decisions.  A negative result may occur with  improper specimen collection / handling, submission of specimen other  than nasopharyngeal swab, presence of viral mutation(s) within the   areas targeted by this assay, and inadequate number of viral copies  (<250 copies / mL). A negative result must be combined with clinical  observations, patient history, and epidemiological information. If result is POSITIVE SARS-CoV-2 target nucleic acids are DETECTED. The SARS-CoV-2 RNA is generally detectable in upper and lower  respiratory specimens dur ing the acute phase of infection.  Positive  results are indicative of active infection with SARS-CoV-2.  Clinical  correlation with patient history and other diagnostic information is  necessary to determine patient infection status.  Positive results do  not rule out bacterial infection or co-infection with other viruses. If result is PRESUMPTIVE POSTIVE SARS-CoV-2 nucleic acids MAY BE PRESENT.   A presumptive positive result was obtained on the submitted specimen  and confirmed on repeat testing.  While 2019 novel coronavirus  (SARS-CoV-2) nucleic acids may be present in the submitted sample  additional confirmatory testing may be necessary for epidemiological  and / or clinical management purposes  to differentiate between  SARS-CoV-2 and other Sarbecovirus currently known to infect humans.  If clinically indicated additional testing with an alternate test  methodology [phone]) is advised. The SARS-CoV-2 RNA is generally  detectable in upper and lower respiratory sp ecimens during the acute  phase of infection. The expected result is Negative. Fact Sheet for Patients:  BoilerBrush.com.cy Fact Sheet for Healthcare Providers: https://pope.com/ This test is not yet approved or cleared by  the [employer] [place] FDA and has been authorized for detection and/or diagnosis of SARS-CoV-2 by FDA under an Emergency Use Authorization (EUA).  This EUA will remain in effect (meaning this test can be used) for the duration of the COVID-19 declaration under Section 564(b)(1) of the Act, 21 U.S.C.  section 360bbb-3(b)(1), unless the authorization is terminated or revoked sooner. Performed at [facility], [address] [place], [place] [address]   MRSA PCR Screening     Status: None   Collection Time: 04/14/19  7:22 PM  Result Value Ref Range Status   MRSA by PCR NEGATIVE NEGATIVE Final    Comment:        The GeneXpert MRSA Assay (FDA approved for NASAL specimens only), is one component of a comprehensive MRSA colonization surveillance program. It is not intended to diagnose MRSA infection nor to guide or monitor treatment for MRSA infections. Performed at [facility], [address] [place], [place] [address]   CULTURE, BLOOD (ROUTINE X 2) w Reflex to ID Panel     Status: None (Preliminary result)   Collection Time: 04/16/19  7:47 AM  Result Value Ref Range Status   Specimen Description BLOOD BLOOD RIGHT HAND  Final   Special Requests   Final    BOTTLES DRAWN AEROBIC AND ANAEROBIC Blood Culture adequate volume   Culture   Final    NO GROWTH 3 DAYS Performed at [facility], [address] [place], [place] [address]    Report Status PENDING  Incomplete  CULTURE, BLOOD (ROUTINE X 2) w Reflex to ID Panel     Status: None (Preliminary result)   Collection Time: 04/16/19  9:27 AM  Result Value Ref Range Status   Specimen Description BLOOD BLOOD RIGHT HAND  Final   Special Requests   Final    BOTTLES DRAWN AEROBIC ONLY Blood Culture adequate volume   Culture   Final    NO GROWTH 3 DAYS Performed at [facility], [address] [place], [place] [address]    Report Status PENDING  Incomplete  MRSA PCR Screening     Status: None   Collection Time: 04/17/19  1:47 PM  Result Value Ref Range Status   MRSA by PCR NEGATIVE NEGATIVE Final    Comment:        The GeneXpert MRSA Assay (FDA approved for NASAL specimens only), is one component of a comprehensive MRSA colonization surveillance program. It is not intended to  diagnose MRSA infection nor to guide or monitor treatment for MRSA infections. Performed at [facility], [address] [place], [place] [address]   Gastrointestinal Panel by PCR , Stool     Status: Abnormal   Collection Time: 04/17/19 10:42 PM  Result Value Ref Range Status   Campylobacter species NOT DETECTED NOT DETECTED Final   Plesimonas shigelloides NOT DETECTED NOT DETECTED Final   Salmonella species NOT DETECTED NOT DETECTED Final   Yersinia enterocolitica NOT DETECTED NOT DETECTED Final   Vibrio species NOT DETECTED NOT DETECTED Final   Vibrio cholerae NOT DETECTED NOT DETECTED Final   Enteroaggregative E coli (EAEC) NOT DETECTED NOT DETECTED Final   Enteropathogenic E coli (EPEC) DETECTED (A) NOT DETECTED Final    Comment: RESULT CALLED TO, READ BACK BY AND VERIFIED WITH: BETH BUONO AT 0211 04/19/2019 SDR    Enterotoxigenic E coli (ETEC) NOT DETECTED NOT DETECTED Final   Shiga like toxin producing E coli (STEC) NOT DETECTED NOT DETECTED Final   Shigella/Enteroinvasive  E coli (EIEC) NOT DETECTED NOT DETECTED Final   Cryptosporidium NOT DETECTED NOT DETECTED Final   Cyclospora cayetanensis NOT DETECTED NOT DETECTED Final   Entamoeba histolytica NOT DETECTED NOT DETECTED Final   Giardia lamblia NOT DETECTED NOT DETECTED Final   Adenovirus F40/41 NOT DETECTED NOT DETECTED Final   Astrovirus NOT DETECTED NOT DETECTED Final   Norovirus GI/GII NOT DETECTED NOT DETECTED Final   Rotavirus A NOT DETECTED NOT DETECTED Final   Sapovirus (I, II, IV, and V) NOT DETECTED NOT DETECTED Final    Comment: Performed at [facility], [address] [place], [place] [address]  C difficile quick scan w PCR reflex     Status: None   Collection Time: 04/18/19 10:42 PM  Result Value Ref Range Status   C Diff antigen NEGATIVE NEGATIVE Final   C Diff toxin NEGATIVE NEGATIVE Final   C Diff interpretation No C. difficile detected.  Final    Comment: Performed at [facility], [address] [place], [place] [address]   Studies/Results: Dg Abd 1 View  Result Date: 04/18/2019 CLINICAL DATA:  Right femoral line placement EXAM: ABDOMEN - 1 VIEW COMPARISON:  Dominant CT 3 days ago FINDINGS: Right femoral line. The tip is obscured by lumbar fusion hardware but is likely at the level of the iliac confluence. Mild gaseous distension of bowel, question mild ileus. Probable anasarca seen at the right flank. IMPRESSION: 1. Femoral line with tip obscured by lumbar fusion hardware. The tip is either at the iliac confluence or IVC. 2. Mild generalized gaseous distension of bowel, there may be developing ileus. Electronically Signed   By: Marnee Spring M.D.   On: 04/18/2019 04:27   US Renal  Result Date: 04/17/2019 CLINICAL DATA:  Acute renal failure EXAM: RENAL / URINARY TRACT ULTRASOUND COMPLETE COMPARISON:  CT from 04/15/2019 FINDINGS: Right Kidney: Renal measurements: 9.8 x 4.5 x 4.7 cm. = volume: 107 mL . Echogenicity within normal limits. No mass or hydronephrosis visualized. Left Kidney: Renal measurements: 9.7 x 5.1 x 5.6 cm. = volume: 145 mL. Echogenicity within normal limits. No mass or hydronephrosis visualized. Bladder: Appears normal for degree of bladder distention. Increased echogenicity of the liver is noted consistent with fatty infiltration. Minimal free fluid is noted adjacent to the liver. IMPRESSION: Kidneys are within normal limits. Fatty infiltration of the liver. Minimal free fluid in the abdomen. Electronically Signed   By: Alcide Clever M.D.   On: 04/17/2019 21:08   Medications: I have reviewed the patient's current medications. Scheduled Meds: . aspirin  81 mg Oral Daily  . Chlorhexidine Gluconate Cloth  6 each Topical Daily  . digoxin  0.125 mg Oral Daily  . hydrocortisone sod succinate (SOLU-CORTEF) inj  100 mg Intravenous Q12H  . midodrine  10 mg Oral TID WC  . pantoprazole (PROTONIX) IV  40 mg Intravenous Q12H  . sodium chloride flush   10-40 mL Intracatheter Q12H  . sodium chloride flush  3 mL Intravenous Once  . venlafaxine XR  37.5 mg Oral Q breakfast   Continuous Infusions: . sodium chloride 5 mL/hr at 04/19/19 1118  . albumin human 12.5 g (04/19/19 1300)  . amiodarone 30 mg/hr (04/18/19 0811)  . cefTRIAXone (ROCEPHIN)  IV Stopped (04/19/19 0041)  . metronidazole Stopped (04/19/19 0737)  . phenylephrine (NEO-SYNEPHRINE) Adult infusion 26.667 mcg/min (04/19/19 0300)  .  sodium bicarbonate  infusion 1000 mL 100 mL/hr at 04/19/19 1118  . sodium chloride     PRN Meds:.sodium  chloride, meclizine, ondansetron **OR** ondansetron (ZOFRAN) IV, senna-docusate, sodium chloride flush   Assessment: Active Problems:   Hypokalemia   Colitis  Sarah Sullivan 73 y.o. female admitted with nausea,vomiting and diarrrhea leading to AKI and presumed shock liver. Rt sided colitis seen on imaging . Also found to have thrombocytopenia. Renal failure worsening . ?? HUS/TTPvs pre renal etiology with added effects of losartan, meloxicam , chlorthalidone.   GI PCR is positive for enteropathogenic E coli and likely has dysentery due to the same  Abnormal LFT's- transaminases and T bilirubin improving.Discussed with Dr Renae Gloss to change antibiotics to include either Azithromycin or ciprofloxacin.    Plan: 1. Consider peripheral blood smear looking for shistocytes/ Hematology opinion - renal failure, low platelet count. 2. IF has diarrhea check stool for GI PCR and C diff 3. F/u acute hepatitis panel, liver doppler USG/acute EBV,CMV,HSV,VZV  4. Once she recovers from this can consider endoscopy as an outpatient.     LOS: 3 days   [name], MD 04/19/2019, 1:48 PM

## 2019-04-19 NOTE — Progress Notes (Signed)
Attempted to call patient's spouse for update; call sent to V/M.

## 2019-04-20 DIAGNOSIS — D696 Thrombocytopenia, unspecified: Secondary | ICD-10-CM

## 2019-04-20 DIAGNOSIS — E860 Dehydration: Secondary | ICD-10-CM

## 2019-04-20 DIAGNOSIS — N179 Acute kidney failure, unspecified: Secondary | ICD-10-CM

## 2019-04-20 LAB — RETIC PANEL
Immature Retic Fract: 23.5 % — ABNORMAL HIGH (ref 2.3–15.9)
RBC.: 4.19 MIL/uL (ref 3.87–5.11)
Retic Count, Absolute: 48.2 10*3/uL (ref 19.0–186.0)
Retic Ct Pct: 1.2 % (ref 0.4–3.1)
Reticulocyte Hemoglobin: 30 pg (ref >27.9)

## 2019-04-20 LAB — CBC
HCT: 26.4 % — ABNORMAL LOW (ref 36.0–46.0)
Hemoglobin: 9.3 g/dL — ABNORMAL LOW (ref 12.0–15.0)
MCH: 30.6 pg (ref 26.0–34.0)
MCHC: 35.2 g/dL (ref 30.0–36.0)
MCV: 86.8 fL (ref 80.0–100.0)
Platelets: 126 10*3/uL — ABNORMAL LOW (ref 150–400)
RBC: 3.04 MIL/uL — ABNORMAL LOW (ref 3.87–5.11)
RDW: 14.6 % (ref 11.5–15.5)
WBC: 12.4 10*3/uL — ABNORMAL HIGH (ref 4.0–10.5)
nRBC: 0 % (ref 0.0–0.2)

## 2019-04-20 LAB — PROTIME-INR
INR: 0.9 (ref 0.8–1.2)
Prothrombin Time: 11.9 seconds (ref 11.4–15.2)

## 2019-04-20 LAB — HEMOGLOBIN AND HEMATOCRIT, BLOOD
HCT: 28.8 % — ABNORMAL LOW (ref 36.0–46.0)
Hemoglobin: 10 g/dL — ABNORMAL LOW (ref 12.0–15.0)

## 2019-04-20 LAB — COMPREHENSIVE METABOLIC PANEL
ALT: 123 U/L — ABNORMAL HIGH (ref 0–44)
AST: 133 U/L — ABNORMAL HIGH (ref 15–41)
Albumin: 2.1 g/dL — ABNORMAL LOW (ref 3.5–5.0)
Alkaline Phosphatase: 573 U/L — ABNORMAL HIGH (ref 38–126)
Anion gap: 14 (ref 5–15)
BUN: 66 mg/dL — ABNORMAL HIGH (ref 8–23)
CO2: 25 mmol/L (ref 22–32)
Calcium: 6.4 mg/dL — CL (ref 8.9–10.3)
Chloride: 101 mmol/L (ref 98–111)
Creatinine, Ser: 4.61 mg/dL — ABNORMAL HIGH (ref 0.44–1.00)
GFR calc Af Amer: 10 mL/min — ABNORMAL LOW (ref >60)
GFR calc non Af Amer: 9 mL/min — ABNORMAL LOW (ref >60)
Glucose, Bld: 156 mg/dL — ABNORMAL HIGH (ref 70–99)
Potassium: 3.3 mmol/L — ABNORMAL LOW (ref 3.5–5.1)
Sodium: 140 mmol/L (ref 135–145)
Total Bilirubin: 1 mg/dL (ref 0.3–1.2)
Total Protein: 4.6 g/dL — ABNORMAL LOW (ref 6.5–8.1)

## 2019-04-20 LAB — APTT: aPTT: 33 seconds (ref 24–36)

## 2019-04-20 LAB — LACTATE DEHYDROGENASE: LDH: 361 U/L — ABNORMAL HIGH (ref 98–192)

## 2019-04-20 LAB — PATHOLOGIST SMEAR REVIEW

## 2019-04-20 MED ORDER — STERILE WATER FOR INJECTION IJ SOLN
INTRAMUSCULAR | Status: AC
  Administered 2019-04-20: 10 mL
  Filled 2019-04-20: qty 10

## 2019-04-20 MED ORDER — CIPROFLOXACIN HCL 500 MG PO TABS
500.0000 mg | ORAL_TABLET | Freq: Every day | ORAL | Status: DC
  Administered 2019-04-21 – 2019-04-28 (×7): 500 mg via ORAL
  Filled 2019-04-20 (×6): qty 1

## 2019-04-20 MED ORDER — POTASSIUM CHLORIDE CRYS ER 20 MEQ PO TBCR
40.0000 meq | EXTENDED_RELEASE_TABLET | Freq: Once | ORAL | Status: AC
  Administered 2019-04-20: 12:00:00 40 meq via ORAL
  Filled 2019-04-20: qty 2

## 2019-04-20 NOTE — Progress Notes (Signed)
Pharmacy Electrolyte Monitoring Consult:  Pharmacy consulted to assist in monitoring and replacing electrolytes in this 73 y.o. female admitted on 04/14/2019 with Nausea and Emesis   Labs:  Sodium (mmol/L)  Date Value  04/20/2019 140  12/25/2014 141   Potassium (mmol/L)  Date Value  04/20/2019 3.3 (L)  12/25/2014 3.5   Magnesium (mg/dL)  Date Value  78/67/6720 2.0   Phosphorus (mg/dL)  Date Value  94/70/9628 3.0   Calcium (mg/dL)  Date Value  36/62/9476 6.4 (LL)   Calcium, Total (mg/dL)  Date Value  54/65/0354 9.2   Albumin (g/dL)  Date Value  65/68/1275 2.1 (L)    Assessment/Plan: Patient received total of 140 mEq of IV and oral potassium replacement on 5/22 on top of receiving NS/40K as MIVF.  Patient in acute renal failure, serum creatinine continues upward trend.   5/27:  K 3.3  Scr 4.61  Scr increased.   Will order KCl PO times one, will need to be cautious with supplementation due to decreasing renal fucntion.   Electrolytes with am labs.   Pharmacy will continue to monitor and adjust per consult.   Clovia Cuff, PharmD, BCPS 04/20/2019 11:24 AM

## 2019-04-20 NOTE — Progress Notes (Signed)
Pt has small bowel movement and urine output post foley catheter removal. When wiping, had a little blood on paper, no blood in stool. Will continue to monitor. Junie Spencer, RN

## 2019-04-20 NOTE — TOC Initial Note (Signed)
Transition of Care (TOC) - Initial/Assessment Note    Patient Details  Name: Sarah Sullivan MRN: [id number] Date of Birth: [birth year]  Transition of Care [facility](TOC) CM/SW Contact:    Allayne ButcherJCommunity Hospit[facility]anna M Karinne Schmader, RN Phone Number: 04/20/2019, 11:28 AM  Clinical Narrative:                 Patient admitted with hypokalemia. Patient has been in the ICU for the past 3 days, patient has floor care orders in.  Patient reports she is from home and lives with her husband.  At baseline patient reports she is independent but does not drive.  Patient's husband drives and provides transportation to appointments.  PCP verified as Doristine MangoElizabeth White, pharmacy is [facility].  [facility], Medicare [facility] provided.  [facility] chooses [facility]. [employer] [facility] notified.  Patient will need a Walker at discharge and Nida BoatmanBrad with [facility] notified.    Expected Discharge Plan: Home w Home Health Services Barriers to Discharge: Continued Medical Work up   Patient Goals and CMS Choice   CMS Medicare.gov Compare Post Acute Care list provided to:: Patient Choice offered to / list presented to : Patient  Expected Discharge Plan and Services Expected Discharge Plan: Home w Home Health Services   Discharge Planning Services: CM Consult Post Acute Care Choice: Durable Medical Equipment, Home Health Living arrangements for the past 2 months: Single Family Home Expected Discharge Date: 04/16/19               DME Arranged: Dan HumphreysWalker rolling DME Agency: [facility] Date DME Agency Contacted: 04/20/19 Time DME Agency Contacted: 1127 Representative spoke with at DME Agency: Lisa RocaBradley Leonard HH Arranged: PT HH Agency: [facility] Date HH Agency Contacted: 04/20/19 Time HH Agency Contacted: [phone] Representative [facility] at [facility]: Feliberto GottronJason Hinton  Prior Living Arrangements/Services Living arrangements for the past 2 months: Single Family Home Lives with:: Spouse Patient  language and need for interpreter reviewed:: No Do you feel safe going back to the place where you live?: Yes      Need for Family Participation in Patient Care: Yes (Comment) Care giver support system in place?: Yes (comment)(lives with husband)   Criminal Activity/Legal Involvement Pertinent to Current Situation/Hospitalization: No - Comment as needed  Activities of Daily Living Home Assistive Devices/Equipment: None ADL Screening (condition at time of admission) Patient's cognitive ability adequate to safely complete daily activities?: Yes Is the patient deaf or have difficulty hearing?: No Does the patient have difficulty seeing, even when wearing glasses/contacts?: No Does the patient have difficulty concentrating, remembering, or making decisions?: No Patient able to express need for assistance with ADLs?: Yes Does the patient have difficulty dressing or bathing?: No Independently performs ADLs?: Yes (appropriate for developmental age) Does the patient have difficulty walking or climbing stairs?: No Weakness of Legs: Right Weakness of Arms/Hands: None  Permission Sought/Granted Permission sought to share information with : Case Manager, Other (comment) Permission granted to share information with : Yes, Verbal Permission Granted     Permission granted to share info w AGENCY: [facility] and [facility]        Emotional Assessment Appearance:: Appears younger than stated age Attitude/Demeanor/Rapport: Engaged Affect (typically observed): Accepting Orientation: : Oriented to Self, Oriented to Place, Oriented to  Time, Oriented to Situation Alcohol / Substance Use: Not Applicable Psych Involvement: No (comment)  Admission diagnosis:  Dehydration [E86.0] Hypokalemia [E87.6] Ileus (HCC) [K56.7] Acute gastritis without hemorrhage, unspecified gastritis type [  K29.00] Patient Active Problem List   Diagnosis Date Noted  . Colitis 04/16/2019  . Hypokalemia 04/14/2019    PCP:  Titus Mould, NP Pharmacy:   [facility] DELIVERY - [address] [place] - [address] [place] [address] Phone: [phone] Fax: [phone]  [facility] [address]), [place] - [address]) [place] [address] Phone: [phone] Fax: [phone]     Social Determinants of Health (SDOH) Interventions    Readmission Risk Interventions No flowsheet data found.

## 2019-04-20 NOTE — Progress Notes (Signed)
CRITICAL CARE NOTE        SUBJECTIVE FINDINGS & SIGNIFICANT EVENTS    Patient is mildly improved, will optimize for downgrade to medical floor. 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 Hilton Sinclair - considering heme/onc eval   PAST MEDICAL HISTORY   Past Medical History:  Diagnosis Date   Hypertension      SURGICAL HISTORY   Past Surgical History:  Procedure Laterality Date   ABDOMINAL HYSTERECTOMY       FAMILY HISTORY   History reviewed. No pertinent family history.   SOCIAL HISTORY   Social History   Tobacco Use   Smoking status: Never Smoker   Smokeless tobacco: Never Used  Substance Use Topics   Alcohol use: Not Currently   Drug use: Not Currently     MEDICATIONS   Current Medication:  Current Facility-Administered Medications:    0.9 %  sodium chloride infusion, , Intravenous, PRN, Vida Rigger, MD, Last Rate: 5 mL/hr at 04/19/19 1800   albumin human 25 % solution 12.5 g, 12.5 g, Intravenous, Q6H, Kolluru, Sarath, MD, Stopped at 04/20/19 0400   amiodarone (NEXTERONE PREMIX) 360-4.14 MG/200ML-% (1.8 mg/mL) IV infusion, 30 mg/hr, Intravenous, Continuous, Blakeney, Neldon Newport, NP, Last Rate: 16.67 mL/hr at 04/18/19 0811, 30 mg/hr at 04/18/19 1610   aspirin chewable tablet 81 mg, 81 mg, Oral, Daily, Pyreddy, Pavan, MD, 81 mg at 04/19/19 1540   Chlorhexidine Gluconate Cloth 2 % PADS 6 each, 6 each, Topical, Daily, Harlon Ditty D, NP, 6 each at 04/19/19 1617   ciprofloxacin (CIPRO) tablet 500 mg, 500 mg, Oral, BID, [name], MD, 500 mg at 04/19/19 1540   hydrocortisone sodium succinate (SOLU-CORTEF) 100 MG injection 100 mg, 100 mg, Intravenous, Q12H, Vida Rigger, MD, 100 mg at  04/19/19 2300   meclizine (ANTIVERT) tablet 12.5 mg, 12.5 mg, Oral, TID PRN, Salena Saner, MD   midodrine (PROAMATINE) tablet 10 mg, 10 mg, Oral, TID WC, Cyree Chuong, MD, 10 mg at 04/19/19 0805   ondansetron (ZOFRAN) tablet 4 mg, 4 mg, Oral, Q6H PRN **OR** ondansetron (ZOFRAN) injection 4 mg, 4 mg, Intravenous, Q6H PRN, Pyreddy, Pavan, MD, 4 mg at 04/19/19 1629   pantoprazole (PROTONIX) injection 40 mg, 40 mg, Intravenous, Q12H, Vanga, Loel Dubonnet, MD, 40 mg at 04/19/19 2309   phenylephrine (NEO-SYNEPHRINE) 40 mg in sodium chloride 0.9 % 250 mL (0.16 mg/mL) infusion, 0-400 mcg/min, Intravenous, Titrated, Blakeney, Neldon Newport, NP, Last Rate: 10 mL/hr at 04/19/19 0300, 26.667 mcg/min at 04/19/19 0300   senna-docusate (Senokot-S) tablet 1 tablet, 1 tablet, Oral, QHS PRN, Pyreddy, Pavan, MD   sodium bicarbonate 150 mEq in dextrose 5 % 1,000 mL infusion, , Intravenous, Continuous, Kolluru, Sarath, MD, Last Rate: 100 mL/hr at 04/20/19 0300   sodium chloride 0.9 % bolus 1,000 mL, 1,000 mL, Intravenous, Once, Mayo, Allyn Kenner, MD   sodium chloride flush (NS) 0.9 % injection 10-40 mL, 10-40 mL, Intracatheter, Q12H, Harlon Ditty D, NP, 10 mL at 04/19/19 2302   sodium chloride flush (NS) 0.9 % injection 10-40 mL, 10-40 mL, Intracatheter, PRN, Harlon Ditty D, NP   sodium chloride flush (NS) 0.9 % injection 3 mL, 3 mL, Intravenous, Once, Jene Every, MD   venlafaxine XR (EFFEXOR-XR) 24 hr capsule 37.5 mg, 37.5 mg, Oral, Q breakfast, Pyreddy, Pavan, MD, 37.5 mg at 04/19/19 0805    ALLERGIES   Patient has no known allergies.    REVIEW OF SYSTEMS    10 point  ROS is negative except for fatigue and mild abdominal pain  PHYSICAL EXAMINATION   Vitals:   04/20/19 0500 04/20/19 0600  BP: 133/62 (!) 143/65  Pulse: (!) 56 (!) 51  Resp: 13 14  Temp: 97.6 F (36.4 C)   SpO2: 95% 95%    GENERAL: Age-appropriate mild distress due to acutely ill state HEAD: Normocephalic,  atraumatic.  EYES: Pupils equal, round, reactive to light.  No scleral icterus.  MOUTH: Moist mucosal membrane. NECK: Supple. No thyromegaly. No nodules. No JVD.  PULMONARY: Clear to auscultation bilaterally CARDIOVASCULAR: S1 and S2. Regular rate and rhythm. No murmurs, rubs, or gallops.  GASTROINTESTINAL: Soft, nontender, non-distended. No masses. Positive bowel sounds. No hepatosplenomegaly.  MUSCULOSKELETAL: No swelling, clubbing, or edema.  NEUROLOGIC: Mild distress due to acute illness SKIN:intact,warm,dry   LABS AND IMAGING       LAB RESULTS: Recent Labs  Lab 04/18/19 0032 04/19/19 0556 04/20/19 0450  NA 140 137 140  K 3.4* 3.7 3.3*  CL 116* 106 101  CO2 15* 21* 25  BUN 68* 63* 66*  CREATININE 3.82* 4.32* 4.61*  GLUCOSE 98 149* 156*   Recent Labs  Lab 04/18/19 0646  04/19/19 0556 04/19/19 1423 04/19/19 1954 04/20/19 0450  HGB 8.1*   8.1*   < > 9.6* 9.7* 9.8* 9.3*  HCT 24.3*   24.4*   < > 28.4* 27.3* 28.0* 26.4*  WBC 7.3  --  9.2  --   --  12.4*  PLT 85*  --  95*  --   --  126*   < > = values in this interval not displayed.     IMAGING RESULTS: US Liver Doppler  Result Date: 04/19/2019 CLINICAL DATA:  73 year old female with liver disease EXAM: DUPLEX ULTRASOUND OF LIVER TECHNIQUE: Color and duplex Doppler ultrasound was performed to evaluate the hepatic in-flow and out-flow vessels. COMPARISON:  None. FINDINGS: Portal Vein Velocities Main:  45 cm/sec Right:  30 cm/sec Left:  23 cm/sec Hepatic Vein Velocities Right:  33 cm/sec Middle:  28 cm/sec Left:  70 cm/sec Hepatic Artery Velocity:  90 cm/sec Splenic Vein Velocity:  13 cm/sec Varices: Absent Ascites: Ascites present, scant volume. Spleen volume 68 cubic cm. Left-sided pleural effusion. Mildly nodular contour of liver parenchyma with increased echogenicity, and no high-resolution images provided. IMPRESSION: Unremarkable directed duplex of the hepatic vasculature. Mildly nodular contour of the liver  suggesting developing cirrhosis, though no high-resolution images were performed. Scant ascites. Electronically Signed   By: Gilmer Mor D.O.   On: 04/19/2019 13:57      ASSESSMENT AND PLAN     -Multidisciplinary rounds held today  Hypovolemic shock Resolved - likely due to hematochezia -s/p fluid resuscitation -Blood transfusion received -Neo-Synephrine stopped -ICU monitoring     Acute blood loss anemia -Status post PRBC transfusion monitoring H&H -GI consultation placed appreciate input -Protonix 40 twice daily -We will check peripheral blood film for schistocytes looking for possible HUS/TTP -Endoscopy as an outpatient -had 3 bloody BMs overnight      Transaminitis -N.p.o. 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 difficile and GI panel negative -on rocephin and flagyl - today is day 3     Enteropathogenic Ecoli infection    Acute renal failureKDIGO stage 3with metabolic acidosis: Nephrology on case appreciate input -  secondary to prerenal azotemia, GI losses and blood losses. ATN. Nonoliguric urine output.  Hematuria and proteinuria on admission.  Creatinine continues to rise.  - Change to sodium bicarbonate infusion.  - Holding home losartan, chlorthalidone and meloxicam.  - Pending serologic work up.   ID -continue IV abx as prescibed -follow up cultures  GI/Nutrition GI PROPHYLAXIS as indicated DIET-->TF's as tolerated Constipation protocol as indicated  ENDO - ICU hypoglycemic\Hyperglycemia protocol -check FSBS per protocol   ELECTROLYTES -follow labs as needed -replace as needed -pharmacy consultation   DVT/GI PRX ordered -SCDs  TRANSFUSIONS AS  NEEDED MONITOR FSBS ASSESS the need for LABS as needed   Critical care provider statement:  Critical care time (minutes):31 Critical care time was exclusive of: Separately billable procedures and treating other patients Critical care was necessary to treat or prevent imminent or life-threatening deterioration of the following conditions:Acute blood loss anemia, hypovolemic shock, colitis, acute kidney injury, transaminitis, multiple comorbid conditions Critical care was time spent personally by me on the following activities: Development of treatment plan with patient or surrogate, discussions with consultants, evaluation of patient's response to treatment, examination of patient, obtaining history from patient or surrogate, ordering and performing treatments and interventions, ordering and review of laboratory studies and re-evaluation of patient's condition. I assumed direction of critical care for this patient from another provider in my specialty: no   This document was prepared using Dragon voice recognition software and may include unintentional dictation errors.    Vida RiggerFuad Elisama Thissen, M.D.  Division of Pulmonary & Critical Care Medicine  [facility]

## 2019-04-20 NOTE — Progress Notes (Signed)
Updated patient's husband.

## 2019-04-20 NOTE — Plan of Care (Signed)

## 2019-04-20 NOTE — Progress Notes (Signed)
PHARMACY NOTE:  ANTIMICROBIAL RENAL DOSAGE ADJUSTMENT  Current antimicrobial regimen includes a mismatch between antimicrobial dosage and estimated renal function.  As per policy approved by the Pharmacy & Therapeutics and Medical Executive Committees, the antimicrobial dosage will be adjusted accordingly.  Current antimicrobial dosage:  Cipro 500mg  PO BID  Indication: intraabdominal infection  Renal Function:  Estimated Creatinine Clearance: 8.4 mL/min (A) (by C-G formula based on SCr of 4.61 mg/dL (H)). []      On intermittent HD, scheduled: []      On CRRT    Antimicrobial dosage has been changed to:  Cipro 500mg  PO daily  Additional comments:   Thank you for allowing pharmacy to be a part of this patient's care.  Clovia Cuff, PharmD, BCPS 04/20/2019 11:28 AM

## 2019-04-20 NOTE — Consult Note (Signed)
Hematology/Oncology Consult note [facility] [phone] [phone]  Patient Care Team: Ricardo Jericho, NP as PCP - General (Family Medicine)   Name of the patient: Sarah Sullivan  [id number]  06-11-1946   Date of visit: 04/20/19 REASON FOR COSULTATION:  Thrombocytopenia History of presenting illness-  73 y.o. female with past medical history including hypertension, previous history of infectious colitis in 2017 who initially presented to emergency room due to nausea, vomiting, diarrhea, dizziness and dehydration.  She was admitted on 04/14/2019. She has been found to have severe dehydration, hypokalemic.  Also developed hypotension, atrial fibrillation during this admission.  She was seen by cardiology for paroxysmal atrial fibrillation with rapid ventricular rate, was felt to be secondary to hypokalemia/dehydration/sepsis.  No indication for anticoagulation as A. fib secondary to reversible cause. Patient has been on supportive care.  Patient is on IV antibiotics for acute colitis  with sepsis and hypotension.  Patient had a normal kidney function at baseline.   On admission, her creatinine was 1.3, creatinine continues to trend up, today's creatinine 4.61.  Seen by nephrology.  Acute renal failure, suspect secondary to ATN due to volume loss. ANA pending SPEP showed no M spike, negative ANCA, negative GBM, normal C3-C4 was tested.  5/25, patient passed a large bloody bowel movement.  Prophylactic heparin was stopped.  Patient also was hypotensive.  Patient was transferred to ICU.   Patient had stool studies done, C. difficile was ruled out.  GI recommend endoscopy evaluation after improvement in her clinical condition prior to discharge.  Stool studies showed enteropathogenic E. coli.  Negative for enterotoxic genic E. coli, Shiga-like toxin producing E. coli, Shiga/enteroinvasive E. Coli.  Patient developed thrombocytopenia and gradually  getting worse.  On admission her platelet count was 149,000, gradually declining, nadir was on 04/18/2019 with a level of 81,000, gradually improving, 04/20/2019 platelet counts 1 26,000. 04/20/2019 pathology smear showed mild leukocytosis with relative predominance of neutrophils with left shift.  Rare red cell regimens.  Thrombocytopenia with normal morphology.  Patient was seen and examined at the bedside.  She is able to communicate with me in [place].  Originally from [place]. Patient was transferred from ICU/stepdown to regular floor this afternoon.. She reports feeling nauseated also complains lower abdominal/discomfort.  She reports not feeling well.  Review of Systems  Constitutional: Positive for fatigue.  Respiratory: Negative for shortness of breath.   Cardiovascular: Negative for chest pain.  Gastrointestinal: Positive for abdominal pain and nausea.  Neurological: Negative for dizziness.  Psychiatric/Behavioral: The patient is nervous/anxious.     No Known Allergies  Patient Active Problem List   Diagnosis Date Noted   Colitis 04/16/2019   Hypokalemia 04/14/2019     Past Medical History:  Diagnosis Date   Hypertension      Past Surgical History:  Procedure Laterality Date   ABDOMINAL HYSTERECTOMY      Social History   Socioeconomic History   Marital status: Married    Spouse name: Not on file   Number of children: Not on file   Years of education: Not on file   Highest education level: Not on file  Occupational History   Not on file  Social Needs   Financial resource strain: Not on file   Food insecurity:    Worry: Not on file    Inability: Not on file   Transportation needs:    Medical: Not on file    Non-medical: Not on file  Tobacco Use  Smoking status: Never Smoker   Smokeless tobacco: Never Used  Substance and Sexual Activity   Alcohol use: Not Currently   Drug use: Not Currently   Sexual activity: Not on file  Lifestyle     Physical activity:    Days per week: Not on file    Minutes per session: Not on file   Stress: Not on file  Relationships   Social connections:    Talks on phone: Not on file    Gets together: Not on file    Attends religious service: Not on file    Active member of club or organization: Not on file    Attends meetings of clubs or organizations: Not on file    Relationship status: Not on file   Intimate partner violence:    Fear of current or ex partner: Not on file    Emotionally abused: Not on file    Physically abused: Not on file    Forced sexual activity: Not on file  Other Topics Concern   Not on file  Social History Narrative   Not on file     History reviewed. No pertinent family history.   Current Facility-Administered Medications:    0.9 %  sodium chloride infusion, , Intravenous, PRN, Ottie Glazier, MD, Stopped at 04/20/19 1419   albumin human 25 % solution 12.5 g, 12.5 g, Intravenous, Q6H, Kolluru, Sarath, MD, Stopped at 04/20/19 1831   amiodarone (NEXTERONE PREMIX) 360-4.14 MG/200ML-% (1.8 mg/mL) IV infusion, 30 mg/hr, Intravenous, Continuous, Awilda Bill, NP, Last Rate: 16.67 mL/hr at 04/18/19 0811, 30 mg/hr at 04/18/19 0160   aspirin chewable tablet 81 mg, 81 mg, Oral, Daily, Pyreddy, Pavan, MD, 81 mg at 04/20/19 1142   Chlorhexidine Gluconate Cloth 2 % PADS 6 each, 6 each, Topical, Daily, Darel Hong D, NP, 6 each at 04/19/19 1617   [START ON 04/21/2019] ciprofloxacin (CIPRO) tablet 500 mg, 500 mg, Oral, Daily, Vira Blanco, RPH   hydrocortisone sodium succinate (SOLU-CORTEF) 100 MG injection 100 mg, 100 mg, Intravenous, Q12H, Ottie Glazier, MD, 100 mg at 04/20/19 1142   meclizine (ANTIVERT) tablet 12.5 mg, 12.5 mg, Oral, TID PRN, Tyler Pita, MD   midodrine (PROAMATINE) tablet 10 mg, 10 mg, Oral, TID WC, Lanney Gins, Fuad, MD, 10 mg at 04/20/19 1732   ondansetron (ZOFRAN) tablet 4 mg, 4 mg, Oral, Q6H PRN **OR** ondansetron  (ZOFRAN) injection 4 mg, 4 mg, Intravenous, Q6H PRN, Pyreddy, Pavan, MD, 4 mg at 04/19/19 1629   pantoprazole (PROTONIX) injection 40 mg, 40 mg, Intravenous, Q12H, Vanga, Tally Due, MD, 40 mg at 04/20/19 1142   senna-docusate (Senokot-S) tablet 1 tablet, 1 tablet, Oral, QHS PRN, Pyreddy, Pavan, MD   sodium chloride 0.9 % bolus 1,000 mL, 1,000 mL, Intravenous, Once, Mayo, Pete Pelt, MD   sodium chloride flush (NS) 0.9 % injection 10-40 mL, 10-40 mL, Intracatheter, Q12H, Darel Hong D, NP, 10 mL at 04/20/19 1000   sodium chloride flush (NS) 0.9 % injection 10-40 mL, 10-40 mL, Intracatheter, PRN, Darel Hong D, NP   sodium chloride flush (NS) 0.9 % injection 3 mL, 3 mL, Intravenous, Once, Lavonia Drafts, MD   venlafaxine XR (EFFEXOR-XR) 24 hr capsule 37.5 mg, 37.5 mg, Oral, Q breakfast, Pyreddy, Pavan, MD, 37.5 mg at 04/20/19 0831   Physical exam:  Vitals:   04/20/19 1100 04/20/19 1200 04/20/19 1300 04/20/19 1431  BP: (!) 123/109 (!) 145/75 (!) 143/63 (!) 172/74  Pulse: (!) 49 (!) 54 (!) 52 (!) 49  Resp:  Temp:    97.6 F (36.4 C)  TempSrc:    Oral  SpO2: 96% 96% 95% 98%  Weight:      Height:       Physical Exam  Constitutional: She is oriented to person, place, and time. No distress.  HENT:  Head: Normocephalic and atraumatic.  Eyes: Pupils are equal, round, and reactive to light. No scleral icterus.  Cardiovascular: Normal rate.  Pulmonary/Chest: Effort normal.  Abdominal: Soft. Bowel sounds are normal.  Musculoskeletal: Normal range of motion.  Neurological: She is alert and oriented to person, place, and time.  Skin: Skin is warm and dry.  Psychiatric:  Anxious        CMP Latest Ref Rng & Units 04/20/2019  Glucose 70 - 99 mg/dL 161(W)  BUN 8 - 23 mg/dL 96(E)  Creatinine 4.54 - 1.00 mg/dL 0.98(J)  Sodium 191 - 478 mmol/L 140  Potassium 3.5 - 5.1 mmol/L 3.3(L)  Chloride 98 - 111 mmol/L 101  CO2 22 - 32 mmol/L 25  Calcium 8.9 - 10.3 mg/dL  6.4(LL)  Total Protein 6.5 - 8.1 g/dL 4.6(L)  Total Bilirubin 0.3 - 1.2 mg/dL 1.0  Alkaline Phos 38 - 126 U/L 573(H)  AST 15 - 41 U/L 133(H)  ALT 0 - 44 U/L 123(H)   CBC Latest Ref Rng & Units 04/20/2019  WBC 4.0 - 10.5 K/uL -  Hemoglobin 12.0 - 15.0 g/dL 10.0(L)  Hematocrit 36.0 - 46.0 % 28.8(L)  Platelets 150 - 400 K/uL -   RADIOGRAPHIC STUDIES: I have personally reviewed the radiological images as listed and agreed with the findings in the report.   Ct Abdomen Pelvis Wo Contrast  Result Date: 04/15/2019 CLINICAL DATA:  Nausea and vomiting, history of hypertension, dizziness. EXAM: CT ABDOMEN AND PELVIS WITHOUT CONTRAST TECHNIQUE: Multidetector CT imaging of the abdomen and pelvis was performed following the standard protocol without IV contrast. COMPARISON:  None. FINDINGS: Lower chest: No acute abnormality. Hepatobiliary: No focal liver abnormality is seen. No gallstones, gallbladder wall thickening, or biliary dilatation. Pancreas: Unremarkable. No pancreatic ductal dilatation or surrounding inflammatory changes. Spleen: Normal in size without focal abnormality. Adrenals/Urinary Tract: Adrenal glands appear normal. Kidneys are unremarkable without mass, stone or hydronephrosis. No perinephric fluid. No ureteral or bladder calculi identified. Bladder appears normal, partially decompressed. Stomach/Bowel: No dilated large or small bowel loops. Questionable thickening of the walls of the RIGHT colon and transverse colon with mild pericolonic inflammation suggesting acute colitis. Appendix is normal. Stomach is unremarkable, partially decompressed. Vascular/Lymphatic: Aortic atherosclerosis. No enlarged lymph nodes seen. Reproductive: Presumed hysterectomy. No adnexal mass or free fluid seen. Other: No free fluid or abscess collection. No free intraperitoneal air. Musculoskeletal: No acute or suspicious osseous finding. Fixation hardware at the L3 through L5 levels appears intact and appropriately  position. IMPRESSION: 1. Suspected mild colitis of the transverse colon and RIGHT colon. 2. Remainder of the abdomen and pelvis CT is unremarkable. No bowel obstruction. No free fluid or abscess collection. No evidence of acute solid organ abnormality. Appendix is normal. Aortic Atherosclerosis (ICD10-I70.0). Electronically Signed   By: Bary Richard M.D.   On: 04/15/2019 11:53   Dg Abd 1 View  Result Date: 04/18/2019 CLINICAL DATA:  Right femoral line placement EXAM: ABDOMEN - 1 VIEW COMPARISON:  Dominant CT 3 days ago FINDINGS: Right femoral line. The tip is obscured by lumbar fusion hardware but is likely at the level of the iliac confluence. Mild gaseous distension of bowel, question mild  ileus. Probable anasarca seen at the right flank. IMPRESSION: 1. Femoral line with tip obscured by lumbar fusion hardware. The tip is either at the iliac confluence or IVC. 2. Mild generalized gaseous distension of bowel, there may be developing ileus. Electronically Signed   By: Marnee Spring M.D.   On: 04/18/2019 04:27   Dg Abd 1 View  Result Date: 04/14/2019 CLINICAL DATA:  Nausea and vomiting for 3 days. EXAM: ABDOMEN - 1 VIEW COMPARISON:  Chest radiograph same date. FINDINGS: The bowel gas pattern is normal. There is mildly prominent stool in the distal colon. There is no supine evidence of free intraperitoneal air or suspicious abdominal calcification. There are postsurgical changes in the lumbar spine status post L3 through 5 fusion. No acute osseous findings. IMPRESSION: No acute abdominal findings. Mildly increased distal colonic stool burden, suggesting constipation. Electronically Signed   By: Carey Bullocks M.D.   On: 04/14/2019 14:32   US Renal  Result Date: 04/17/2019 CLINICAL DATA:  Acute renal failure EXAM: RENAL / URINARY TRACT ULTRASOUND COMPLETE COMPARISON:  CT from 04/15/2019 FINDINGS: Right Kidney: Renal measurements: 9.8 x 4.5 x 4.7 cm. = volume: 107 mL . Echogenicity within normal limits.  No mass or hydronephrosis visualized. Left Kidney: Renal measurements: 9.7 x 5.1 x 5.6 cm. = volume: 145 mL. Echogenicity within normal limits. No mass or hydronephrosis visualized. Bladder: Appears normal for degree of bladder distention. Increased echogenicity of the liver is noted consistent with fatty infiltration. Minimal free fluid is noted adjacent to the liver. IMPRESSION: Kidneys are within normal limits. Fatty infiltration of the liver. Minimal free fluid in the abdomen. Electronically Signed   By: Alcide Clever M.D.   On: 04/17/2019 21:08   Dg Chest Port 1 View  Result Date: 04/14/2019 CLINICAL DATA:  Weakness.  Nausea and vomiting for 3 days. EXAM: PORTABLE CHEST 1 VIEW COMPARISON:  None. FINDINGS: The cardiomediastinal silhouette is within normal limits. The lungs are mildly hypoinflated with mild bronchovascular crowding in the lung bases. No airspace consolidation, edema, pleural effusion, pneumothorax is identified. No acute osseous abnormality is seen. IMPRESSION: No active disease. Electronically Signed   By: Sebastian Ache M.D.   On: 04/14/2019 11:15   US Liver Doppler  Result Date: 04/19/2019 CLINICAL DATA:  73 year old female with liver disease EXAM: DUPLEX ULTRASOUND OF LIVER TECHNIQUE: Color and duplex Doppler ultrasound was performed to evaluate the hepatic in-flow and out-flow vessels. COMPARISON:  None. FINDINGS: Portal Vein Velocities Main:  45 cm/sec Right:  30 cm/sec Left:  23 cm/sec Hepatic Vein Velocities Right:  33 cm/sec Middle:  28 cm/sec Left:  70 cm/sec Hepatic Artery Velocity:  90 cm/sec Splenic Vein Velocity:  13 cm/sec Varices: Absent Ascites: Ascites present, scant volume. Spleen volume 68 cubic cm. Left-sided pleural effusion. Mildly nodular contour of liver parenchyma with increased echogenicity, and no high-resolution images provided. IMPRESSION: Unremarkable directed duplex of the hepatic vasculature. Mildly nodular contour of the liver suggesting developing  cirrhosis, though no high-resolution images were performed. Scant ascites. Electronically Signed   By: Gilmer Mor D.O.   On: 04/19/2019 13:57    Assessment and plan- Patient is a 73 y.o. female who is currently admitted due to hypovolemic shock secondary to severe dehydration/sepsis/colitis.  Developed thrombocytopenia during hospitalization.   #Thrombocytopenia, Blood smear was reviewed by me.  Very occasional schistocytes. I discussed with pathology Dr. Oneita Kras who agrees that there is only a few schistocytes, <1 /hpf.  Platelet counts has trended down with nadir on 04/18/2019, and  has improved.  Today's platelet count is > 100,000. Stool studies were reviewed.  Negative for enterotoxic genic E. coli, Shiga-like toxin producing E. coli, Shiga/enteroinvasive E. Coli.. Differential includes Most likely secondary to consumption due to sepsis/colitis/GI bleed, MAHA (TTP,HUS,aHUS)-will check hemolysis panel including LDH, haptoglobin, plasma free hemoglobin, will also check reticulocyte panel, coags.   #Acute renal failure, likely secondary to ATN. #Anemia, normal B12 and folate level.  Hemoglobin was 11.8 on admission. Reticulocyte panel shows adequate reticulocytes hemoglobin.  Likely anemia secondary to acute blood loss.  Hemoglobin stable.  Continue to monitor.  I called patient's husband and updated above.  Thank you for allowing me to participate in the care of this patient.  Total face to face encounter time for this patient visit was 70 min. >50% of the time was  spent in counseling and coordination of care.    Rickard PatienceZhou Taia Bramlett, MD, PhD Hematology Oncology [facility] Pager- [phone] 04/20/2019

## 2019-04-20 NOTE — Progress Notes (Addendum)
Sarah Sullivan , MD [address], [address], [place], [place], [address] 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 Day 2 of follow up   Subjective: No rectal bleeding but some abdominal pain    Objective: Vital signs in last 24 hours: Vitals:   04/20/19 0600 04/20/19 0700 04/20/19 0800 04/20/19 0835  BP: (!) 143/65 140/69 138/64   Pulse: (!) 51 (!) 50 (!) 51 67  Resp: 14 14 14 15   Temp:    97.8 F (36.6 C)  TempSrc:    Oral  SpO2: 95% 96% 95% 95%  Weight:      Height:       Weight change:   Intake/Output Summary (Last 24 hours) at 04/20/2019 1013 Last data filed at 04/20/2019 7035 Gross per 24 hour  Intake 2310.23 ml  Output 765 ml  Net 1545.23 ml     Exam: Heart:: Regular rate and rhythm, S1S2 present or without murmur or extra heart sounds Lungs: normal, clear to auscultation and clear to auscultation and percussion Abdomen: soft, nontender, normal bowel sounds   Lab Results: @LABTEST2 @ Micro Results: Recent Results (from the past 240 hour(s))  SARS Coronavirus 2 (CEPHEID - Performed in [facility] hospital lab), Hosp Order     Status: None   Collection Time: 04/14/19 10:32 AM  Result Value Ref Range Status   SARS Coronavirus 2 NEGATIVE NEGATIVE Final    Comment: (NOTE) If result is NEGATIVE SARS-CoV-2 target nucleic acids are NOT DETECTED. The SARS-CoV-2 RNA is generally detectable in upper and lower  respiratory specimens during the acute phase of infection. The lowest  concentration of SARS-CoV-2 viral copies this assay can detect is 250  copies / mL. A negative result does not preclude SARS-CoV-2 infection  and should not be used as the sole basis for treatment or other  patient management decisions.  A negative result may occur with  improper specimen collection / handling, submission of specimen other  than nasopharyngeal swab, presence of viral  mutation(s) within the  areas targeted by this assay, and inadequate number of viral copies  (<250 copies / mL). A negative result must be combined with clinical  observations, patient history, and epidemiological information. If result is POSITIVE SARS-CoV-2 target nucleic acids are DETECTED. The SARS-CoV-2 RNA is generally detectable in upper and lower  respiratory specimens dur ing the acute phase of infection.  Positive  results are indicative of active infection with SARS-CoV-2.  Clinical  correlation with patient history and other diagnostic information is  necessary to determine patient infection status.  Positive results do  not rule out bacterial infection or co-infection with other viruses. If result is PRESUMPTIVE POSTIVE SARS-CoV-2 nucleic acids MAY BE PRESENT.   A presumptive positive result was obtained on the submitted specimen  and confirmed on repeat testing.  While 2019 novel coronavirus  (SARS-CoV-2) nucleic acids may be present in the submitted sample  additional confirmatory testing may be necessary for epidemiological  and / or clinical management purposes  to differentiate between  SARS-CoV-2 and other Sarbecovirus currently known to infect humans.  If clinically indicated additional testing with an alternate test  methodology [phone]) is advised. The SARS-CoV-2 RNA is generally  detectable in upper and lower respiratory sp ecimens during the acute  phase of infection. The expected result is Negative. Fact Sheet for Patients:  BoilerBrush.com.cy Fact Sheet for Healthcare Providers: https://pope.com/ This test is  not yet approved or cleared by the [place] and has been authorized for detection and/or diagnosis of SARS-CoV-2 by FDA under an Emergency Use Authorization (EUA).  This EUA will remain in effect (meaning this test can be used) for the duration of the COVID-19 declaration under Section 564(b)(1)  of the Act, 21 U.S.C. section 360bbb-3(b)(1), unless the authorization is terminated or revoked sooner. Performed at [facility], [address] [place], [place] [address]   MRSA PCR Screening     Status: None   Collection Time: 04/14/19  7:22 PM  Result Value Ref Range Status   MRSA by PCR NEGATIVE NEGATIVE Final    Comment:        The GeneXpert MRSA Assay (FDA approved for NASAL specimens only), is one component of a comprehensive MRSA colonization surveillance program. It is not intended to diagnose MRSA infection nor to guide or monitor treatment for MRSA infections. Performed at [facility], [address] [place], [place] [address]   CULTURE, BLOOD (ROUTINE X 2) w Reflex to ID Panel     Status: None (Preliminary result)   Collection Time: 04/16/19  7:47 AM  Result Value Ref Range Status   Specimen Description BLOOD BLOOD RIGHT HAND  Final   Special Requests   Final    BOTTLES DRAWN AEROBIC AND ANAEROBIC Blood Culture adequate volume   Culture   Final    NO GROWTH 4 DAYS Performed at [facility], [address] [place], [place] [address]    Report Status PENDING  Incomplete  CULTURE, BLOOD (ROUTINE X 2) w Reflex to ID Panel     Status: None (Preliminary result)   Collection Time: 04/16/19  9:27 AM  Result Value Ref Range Status   Specimen Description BLOOD BLOOD RIGHT HAND  Final   Special Requests   Final    BOTTLES DRAWN AEROBIC ONLY Blood Culture adequate volume   Culture   Final    NO GROWTH 4 DAYS Performed at [facility], [address] [place], [place] [address]    Report Status PENDING  Incomplete  MRSA PCR Screening     Status: None   Collection Time: 04/17/19  1:47 PM  Result Value Ref Range Status   MRSA by PCR NEGATIVE NEGATIVE Final    Comment:        The GeneXpert MRSA Assay (FDA approved for NASAL specimens only), is one component of a comprehensive MRSA colonization surveillance program. It is  not intended to diagnose MRSA infection nor to guide or monitor treatment for MRSA infections. Performed at [facility], [address] [place], [place] [address]   Gastrointestinal Panel by PCR , Stool     Status: Abnormal   Collection Time: 04/17/19 10:42 PM  Result Value Ref Range Status   Campylobacter species NOT DETECTED NOT DETECTED Final   Plesimonas shigelloides NOT DETECTED NOT DETECTED Final   Salmonella species NOT DETECTED NOT DETECTED Final   Yersinia enterocolitica NOT DETECTED NOT DETECTED Final   Vibrio species NOT DETECTED NOT DETECTED Final   Vibrio cholerae NOT DETECTED NOT DETECTED Final   Enteroaggregative E coli (EAEC) NOT DETECTED NOT DETECTED Final   Enteropathogenic E coli (EPEC) DETECTED (A) NOT DETECTED Final    Comment: RESULT CALLED TO, READ BACK BY AND VERIFIED WITH: BETH BUONO AT 0211 04/19/2019 SDR    Enterotoxigenic E coli (ETEC) NOT DETECTED NOT DETECTED Final   Shiga like toxin producing E coli (STEC) NOT DETECTED  NOT DETECTED Final   Shigella/Enteroinvasive E coli (EIEC) NOT DETECTED NOT DETECTED Final   Cryptosporidium NOT DETECTED NOT DETECTED Final   Cyclospora cayetanensis NOT DETECTED NOT DETECTED Final   Entamoeba histolytica NOT DETECTED NOT DETECTED Final   Giardia lamblia NOT DETECTED NOT DETECTED Final   Adenovirus F40/41 NOT DETECTED NOT DETECTED Final   Astrovirus NOT DETECTED NOT DETECTED Final   Norovirus GI/GII NOT DETECTED NOT DETECTED Final   Rotavirus A NOT DETECTED NOT DETECTED Final   Sapovirus (I, II, IV, and V) NOT DETECTED NOT DETECTED Final    Comment: Performed at [facility], [address] [place], [place] [id number]  C difficile quick scan w PCR reflex     Status: None   Collection Time: 04/18/19 10:42 PM  Result Value Ref Range Status   C Diff antigen NEGATIVE NEGATIVE Final   C Diff toxin NEGATIVE NEGATIVE Final   C Diff interpretation No C. difficile detected.  Final    Comment: Performed  at [facility], [address] [place], [place] [address]   Studies/Results: [place] Liver Doppler  Result Date: 04/19/2019 CLINICAL DATA:  73 year old female with liver disease EXAM: DUPLEX ULTRASOUND OF LIVER TECHNIQUE: Color and duplex Doppler ultrasound was performed to evaluate the hepatic in-flow and out-flow vessels. COMPARISON:  None. FINDINGS: Portal Vein Velocities Main:  45 cm/sec Right:  30 cm/sec Left:  23 cm/sec Hepatic Vein Velocities Right:  33 cm/sec Middle:  28 cm/sec Left:  70 cm/sec Hepatic Artery Velocity:  90 cm/sec Splenic Vein Velocity:  13 cm/sec Varices: Absent Ascites: Ascites present, scant volume. Spleen volume 68 cubic cm. Left-sided pleural effusion. Mildly nodular contour of liver parenchyma with increased echogenicity, and no high-resolution images provided. IMPRESSION: Unremarkable directed duplex of the hepatic vasculature. Mildly nodular contour of the liver suggesting developing cirrhosis, though no high-resolution images were performed. Scant ascites. Electronically Signed   By: Gilmer MorJaime  Wagner D.O.   On: 04/19/2019 13:57   Medications: I have reviewed the patient's current medications. Scheduled Meds: . aspirin  81 mg Oral Daily  . Chlorhexidine Gluconate Cloth  6 each Topical Daily  . [START ON 04/21/2019] ciprofloxacin  500 mg Oral Daily  . hydrocortisone sod succinate (SOLU-CORTEF) inj  100 mg Intravenous Q12H  . midodrine  10 mg Oral TID WC  . pantoprazole (PROTONIX) IV  40 mg Intravenous Q12H  . potassium chloride  40 mEq Oral Once  . sodium chloride flush  10-40 mL Intracatheter Q12H  . sodium chloride flush  3 mL Intravenous Once  . venlafaxine XR  37.5 mg Oral Q breakfast   Continuous Infusions: . sodium chloride 5 mL/hr at 04/19/19 1800  . albumin human Stopped (04/20/19 0400)  . amiodarone 30 mg/hr (04/18/19 0811)  . sodium chloride     PRN Meds:.sodium chloride, meclizine, ondansetron **OR** ondansetron (ZOFRAN) IV, senna-docusate,  sodium chloride flush   Assessment: Active Problems:   Hypokalemia   Colitis  Denman GeorgeLamai Daniels72 y.o.femaleadmitted with nausea,vomiting and diarrrhea leading to AKI and presumed shock liver. Rt sided colitis seen on imaging . Also found to have thrombocytopenia. Renal failure worsening. GI PCR is positive for enteropathogenic E coli and likely has dysentery due to the same , on Ciprofloxacin day 2   Plan: 1. . F/u acute hepatitis panel, acute EBV,CMV,HSV,VZV  2 Once she recovers from this can consider endoscopy as an outpatient. 3. Complete course of ciprofloxacin.  4.concerns for Cirrhosis on imaging- will need outpatient work up and evaluation- EGD  to screen for varices.       LOS: 4 days   Sarah Mood, MD 04/20/2019, 10:13 AM

## 2019-04-20 NOTE — Progress Notes (Signed)
[facility]  ROUNDING NOTE   Subjective:   Patient denies any diarrhea. Tolerating her diet.   Off phenylephrine  Creatinine 4.61 (4.32) (3.82)  Objective:  Vital signs in last 24 hours:  Temp:  [97.2 F (36.2 C)-98 F (36.7 C)] 97.6 F (36.4 C) (05/28 0500) Pulse Rate:  [49-58] 51 (05/28 0600) Resp:  [13-20] 14 (05/28 0600) BP: (103-143)/(44-74) 143/65 (05/28 0600) SpO2:  [94 %-97 %] 95 % (05/28 0600) FiO2 (%):  [100 %] 100 % (05/27 2000)  Weight change:  Filed Weights   04/16/19 0010 04/17/19 0301 04/17/19 1246  Weight: 51 kg 55.1 kg 57.4 kg    Intake/Output: I/O last 3 completed shifts: In: 3649.4 [I.V.:3172.2; IV Piggyback:477.2] Out: 720 [Urine:720]   Intake/Output this shift:  No intake/output data recorded.  Physical Exam: General: NAD,   Head: Normocephalic, atraumatic. Moist oral mucosal membranes  Eyes: Anicteric, PERRL  Neck: Supple, trachea midline  Lungs:  Clear to auscultation  Heart: irregular  Abdomen:  Mild distension, nontender  Extremities:  no peripheral edema.  Neurologic: Nonfocal, moving all four extremities  Skin: No lesions        Basic Metabolic Panel: Recent Labs  Lab 04/14/19 1032 04/14/19 1649  04/15/19 2215 04/16/19 0351 04/17/19 0436 04/18/19 0032 04/19/19 0556 04/20/19 0450  NA 135  --    < > 137 139 136 140 137 140  K 2.2* 2.6*   < > 3.8 3.7 3.4* 3.4* 3.7 3.3*  CL 97*  --    < > 111 112* 113* 116* 106 101  CO2 27  --    < > 18* 17* 16* 15* 21* 25  GLUCOSE 113*  --    < > 100* 78 118* 98 149* 156*  BUN 27*  --    < > 32* 31* 58* 68* 63* 66*  CREATININE 1.30*  --    < > 2.28* 2.11* 2.91* 3.82* 4.32* 4.61*  CALCIUM 9.1  --    < > 7.3* 7.3* 6.7* 6.6* 6.1* 6.4*  MG 1.9  1.8  --   --  1.9 2.0  --   --   --   --   PHOS  --  3.0  --   --   --   --   --   --   --    < > = values in this interval not displayed.    Liver Function Tests: Recent Labs  Lab 04/16/19 0351 04/17/19 0436 04/18/19 0032  04/19/19 0556 04/20/19 0450  AST 270* 482* 485* 204* 133*  ALT 176* 281* 286* 172* 123*  ALKPHOS 173* 181* 321* 423* 573*  BILITOT 2.1* 1.2 1.7* 1.1 1.0  PROT 5.3* 4.2* 4.4* 3.9* 4.6*  ALBUMIN 2.5* 1.8* 1.8* 1.6* 2.1*   Recent Labs  Lab 04/14/19 1032  LIPASE 26   No results for input(s): AMMONIA in the last 168 hours.  CBC: Recent Labs  Lab 04/14/19 1032  04/16/19 0351 04/17/19 0436  04/17/19 1456  04/18/19 0032 04/18/19 0646 04/18/19 1308 04/19/19 0556 04/19/19 1423 04/19/19 1954 04/20/19 0450  WBC 8.9   < > 7.7 5.5  --   --   --  5.4 7.3  --  9.2  --   --  12.4*  NEUTROABS 7.6  --  6.3 4.2  --   --   --   --   --   --  7.1  --   --   --   HGB 11.8*   < >  11.6* 7.6*   < >  --    < > 6.6* 8.1*  8.1* 8.1* 9.6* 9.7* 9.8* 9.3*  HCT 35.5*   < > 36.1 24.9*   < >  --    < > 20.4* 24.3*  24.4* 24.1* 28.4* 27.3* 28.0* 26.4*  MCV 93.7   < > 96.3 101.6*  --   --   --  95.3 93.1  --  89.9  --   --  86.8  PLT 149*   < > 106* 90*  --  84*  --  81* 85*  --  95*  --   --  126*   < > = values in this interval not displayed.    Cardiac Enzymes: Recent Labs  Lab 04/14/19 1032 04/15/19 2215 04/16/19 0351 04/16/19 0946  CKTOTAL  --   --   --  272*  TROPONINI 0.04* <0.03 <0.03 0.03*    BNP: Invalid input(s): POCBNP  CBG: No results for input(s): GLUCAP in the last 168 hours.  Microbiology: Results for orders placed or performed during the hospital encounter of 04/14/19  SARS Coronavirus 2 (CEPHEID - Performed in [facility] hospital lab), Hosp Order     Status: None   Collection Time: 04/14/19 10:32 AM  Result Value Ref Range Status   SARS Coronavirus 2 NEGATIVE NEGATIVE Final    Comment: (NOTE) If result is NEGATIVE SARS-CoV-2 target nucleic acids are NOT DETECTED. The SARS-CoV-2 RNA is generally detectable in upper and lower  respiratory specimens during the acute phase of infection. The lowest  concentration of SARS-CoV-2 viral copies this assay can detect is 250   copies / mL. A negative result does not preclude SARS-CoV-2 infection  and should not be used as the sole basis for treatment or other  patient management decisions.  A negative result may occur with  improper specimen collection / handling, submission of specimen other  than nasopharyngeal swab, presence of viral mutation(s) within the  areas targeted by this assay, and inadequate number of viral copies  (<250 copies / mL). A negative result must be combined with clinical  observations, patient history, and epidemiological information. If result is POSITIVE SARS-CoV-2 target nucleic acids are DETECTED. The SARS-CoV-2 RNA is generally detectable in upper and lower  respiratory specimens dur ing the acute phase of infection.  Positive  results are indicative of active infection with SARS-CoV-2.  Clinical  correlation with patient history and other diagnostic information is  necessary to determine patient infection status.  Positive results do  not rule out bacterial infection or co-infection with other viruses. If result is PRESUMPTIVE POSTIVE SARS-CoV-2 nucleic acids MAY BE PRESENT.   A presumptive positive result was obtained on the submitted specimen  and confirmed on repeat testing.  While 2019 novel coronavirus  (SARS-CoV-2) nucleic acids may be present in the submitted sample  additional confirmatory testing may be necessary for epidemiological  and / or clinical management purposes  to differentiate between  SARS-CoV-2 and other Sarbecovirus currently known to infect humans.  If clinically indicated additional testing with an alternate test  methodology [phone]) is advised. The SARS-CoV-2 RNA is generally  detectable in upper and lower respiratory sp ecimens during the acute  phase of infection. The expected result is Negative. Fact Sheet for Patients:  BoilerBrush.com.cy Fact Sheet for Healthcare  Providers: https://pope.com/ This test is not yet approved or cleared by the [place] FDA and has been authorized for detection and/or diagnosis of SARS-CoV-2 by FDA under  an Emergency Use Authorization (EUA).  This EUA will remain in effect (meaning this test can be used) for the duration of the COVID-19 declaration under Section 564(b)(1) of the Act, 21 U.S.C. section 360bbb-3(b)(1), unless the authorization is terminated or revoked sooner. Performed at [facility], [address] [place], [place] [address]   MRSA PCR Screening     Status: None   Collection Time: 04/14/19  7:22 PM  Result Value Ref Range Status   MRSA by PCR NEGATIVE NEGATIVE Final    Comment:        The GeneXpert MRSA Assay (FDA approved for NASAL specimens only), is one component of a comprehensive MRSA colonization surveillance program. It is not intended to diagnose MRSA infection nor to guide or monitor treatment for MRSA infections. Performed at [facility], [address] [place], [place] [address]   CULTURE, BLOOD (ROUTINE X 2) w Reflex to ID Panel     Status: None (Preliminary result)   Collection Time: 04/16/19  7:47 AM  Result Value Ref Range Status   Specimen Description BLOOD BLOOD RIGHT HAND  Final   Special Requests   Final    BOTTLES DRAWN AEROBIC AND ANAEROBIC Blood Culture adequate volume   Culture   Final    NO GROWTH 4 DAYS Performed at [facility], [address] [place], [place] [address]    Report Status PENDING  Incomplete  CULTURE, BLOOD (ROUTINE X 2) w Reflex to ID Panel     Status: None (Preliminary result)   Collection Time: 04/16/19  9:27 AM  Result Value Ref Range Status   Specimen Description BLOOD BLOOD RIGHT HAND  Final   Special Requests   Final    BOTTLES DRAWN AEROBIC ONLY Blood Culture adequate volume   Culture   Final    NO GROWTH 4 DAYS Performed at [facility], [address]  [place], [place] [address]    Report Status PENDING  Incomplete  MRSA PCR Screening     Status: None   Collection Time: 04/17/19  1:47 PM  Result Value Ref Range Status   MRSA by PCR NEGATIVE NEGATIVE Final    Comment:        The GeneXpert MRSA Assay (FDA approved for NASAL specimens only), is one component of a comprehensive MRSA colonization surveillance program. It is not intended to diagnose MRSA infection nor to guide or monitor treatment for MRSA infections. Performed at [facility], [address] [place], [place] [address]   Gastrointestinal Panel by PCR , Stool     Status: Abnormal   Collection Time: 04/17/19 10:42 PM  Result Value Ref Range Status   Campylobacter species NOT DETECTED NOT DETECTED Final   Plesimonas shigelloides NOT DETECTED NOT DETECTED Final   Salmonella species NOT DETECTED NOT DETECTED Final   Yersinia enterocolitica NOT DETECTED NOT DETECTED Final   Vibrio species NOT DETECTED NOT DETECTED Final   Vibrio cholerae NOT DETECTED NOT DETECTED Final   Enteroaggregative E coli (EAEC) NOT DETECTED NOT DETECTED Final   Enteropathogenic E coli (EPEC) DETECTED (A) NOT DETECTED Final    Comment: RESULT CALLED TO, READ BACK BY AND VERIFIED WITH: BETH BUONO AT 0211 04/19/2019 SDR    Enterotoxigenic E coli (ETEC) NOT DETECTED NOT DETECTED Final   Shiga like toxin producing E coli (STEC) NOT DETECTED NOT DETECTED Final   Shigella/Enteroinvasive E coli (EIEC) NOT DETECTED NOT DETECTED Final   Cryptosporidium NOT DETECTED NOT DETECTED Final  Cyclospora cayetanensis NOT DETECTED NOT DETECTED Final   Entamoeba histolytica NOT DETECTED NOT DETECTED Final   Giardia lamblia NOT DETECTED NOT DETECTED Final   Adenovirus F40/41 NOT DETECTED NOT DETECTED Final   Astrovirus NOT DETECTED NOT DETECTED Final   Norovirus GI/GII NOT DETECTED NOT DETECTED Final   Rotavirus A NOT DETECTED NOT DETECTED Final   Sapovirus (I, II, IV, and V) NOT DETECTED NOT DETECTED Final     Comment: Performed at [facility], [address] [place], [place] [address]  C difficile quick scan w PCR reflex     Status: None   Collection Time: 04/18/19 10:42 PM  Result Value Ref Range Status   C Diff antigen NEGATIVE NEGATIVE Final   C Diff toxin NEGATIVE NEGATIVE Final   C Diff interpretation No C. difficile detected.  Final    Comment: Performed at [facility], [address] [place], [place] [address]    Coagulation Studies: Recent Labs    04/17/19 1206  LABPROT 13.1  INR 1.0    Urinalysis: No results for input(s): COLORURINE, LABSPEC, PHURINE, GLUCOSEU, HGBUR, BILIRUBINUR, KETONESUR, PROTEINUR, UROBILINOGEN, NITRITE, LEUKOCYTESUR in the last 72 hours.  Invalid input(s): APPERANCEUR    Imaging: US Liver Doppler  Result Date: 04/19/2019 CLINICAL DATA:  73 year old female with liver disease EXAM: DUPLEX ULTRASOUND OF LIVER TECHNIQUE: Color and duplex Doppler ultrasound was performed to evaluate the hepatic in-flow and out-flow vessels. COMPARISON:  None. FINDINGS: Portal Vein Velocities Main:  45 cm/sec Right:  30 cm/sec Left:  23 cm/sec Hepatic Vein Velocities Right:  33 cm/sec Middle:  28 cm/sec Left:  70 cm/sec Hepatic Artery Velocity:  90 cm/sec Splenic Vein Velocity:  13 cm/sec Varices: Absent Ascites: Ascites present, scant volume. Spleen volume 68 cubic cm. Left-sided pleural effusion. Mildly nodular contour of liver parenchyma with increased echogenicity, and no high-resolution images provided. IMPRESSION: Unremarkable directed duplex of the hepatic vasculature. Mildly nodular contour of the liver suggesting developing cirrhosis, though no high-resolution images were performed. Scant ascites. Electronically Signed   By: Gilmer Mor D.O.   On: 04/19/2019 13:57     Medications:   . sodium chloride 5 mL/hr at 04/19/19 1800  . albumin human Stopped (04/20/19 0400)  . amiodarone 30 mg/hr (04/18/19 0811)  . phenylephrine (NEO-SYNEPHRINE)  Adult infusion 26.667 mcg/min (04/19/19 0300)  .  sodium bicarbonate  infusion 1000 mL 100 mL/hr at 04/20/19 0834  . sodium chloride     . aspirin  81 mg Oral Daily  . Chlorhexidine Gluconate Cloth  6 each Topical Daily  . ciprofloxacin  500 mg Oral BID  . hydrocortisone sod succinate (SOLU-CORTEF) inj  100 mg Intravenous Q12H  . midodrine  10 mg Oral TID WC  . pantoprazole (PROTONIX) IV  40 mg Intravenous Q12H  . potassium chloride  40 mEq Oral Once  . sodium chloride flush  10-40 mL Intracatheter Q12H  . sodium chloride flush  3 mL Intravenous Once  . venlafaxine XR  37.5 mg Oral Q breakfast   sodium chloride, meclizine, ondansetron **OR** ondansetron (ZOFRAN) IV, senna-docusate, sodium chloride flush  Assessment/ Plan:  Ms. Sarah Sullivan is a 73 y.o. Asian female with a PMHx of hypertension, degenerative disc disease, lumbar radiculitis, history of depression, osteopenia, who was admitted to [facility] on 04/14/2019 for evaluation of nausea, vomiting, and dizziness.   1.  Acute renal failure with metabolic acidosis: secondary to prerenal azotemia, GI losses and blood losses. ATN. Nonoliguric urine output.  Hematuria and proteinuria on  admission.  Creatinine continues to rise. No indication for dialysis at this time. Suspect plateau.  Nonoliguric urine output.  - Discontinue sodium bicarbonate infusion.  - Holding home losartan, chlorthalidone and meloxicam.  - Negative serologic work up.  - IV albumin to assist with 3rd spacing.   2. Hypotension with atrial fibrillation. Required vasopressors.  - amiodarone gtt.  - Midodrine  3. Anemia with renal failure: status post 1 unit PRBC 5/26. With thrombocytopenia   LOS: 4 Sarah Sullivan 5/28/20208:35 AM

## 2019-04-20 NOTE — Progress Notes (Signed)
Patient ID: Sarah Sullivan, female   DOB: 1945/12/09, 73 y.o.   MRN: [id number]  [facility] PROGRESS NOTE  Sarah Sullivan CNG:[id number] DOB: 1946-10-13 DOA: 04/14/2019 PCP: Sarah Mould, NP  HPI/Subjective: Patient feeling weak and tired.  Poor appetite.  Feeling nauseous.  Some abdominal pain.  Diarrhea settled down.  Objective: Vitals:   04/20/19 1000 04/20/19 1100  BP: (!) 155/72 (!) 123/109  Pulse: (!) 50 (!) 49  Resp: 14 16  Temp:    SpO2: 95% 96%    Filed Weights   04/16/19 0010 04/17/19 0301 04/17/19 1246  Weight: 51 kg 55.1 kg 57.4 kg    ROS: Review of Systems  Constitutional: Negative for chills and fever.  Eyes: Negative for blurred vision.  Respiratory: Negative for cough and shortness of breath.   Cardiovascular: Negative for chest pain.  Gastrointestinal: Positive for abdominal pain and nausea. Negative for constipation, diarrhea and vomiting.  Genitourinary: Negative for dysuria.  Musculoskeletal: Negative for joint pain.  Neurological: Negative for dizziness and headaches.   Exam: Physical Exam  Constitutional: She is oriented to person, place, and time.  HENT:  Nose: No mucosal edema.  Mouth/Throat: No oropharyngeal exudate or posterior oropharyngeal edema.  Eyes: Pupils are equal, round, and reactive to light. Conjunctivae, EOM and lids are normal.  Neck: No JVD present. Carotid bruit is not present. No edema present. No thyroid mass and no thyromegaly present.  Cardiovascular: S1 normal and S2 normal. Exam reveals no gallop.  No murmur heard. Pulses:      Dorsalis pedis pulses are 2+ on the right side and 2+ on the left side.  Respiratory: No respiratory distress. She has decreased breath sounds in the right lower field and the left lower field. She has no wheezes. She has no rhonchi. She has no rales.  GI: Soft. Bowel sounds are normal. There is generalized abdominal tenderness.  Musculoskeletal:     Right ankle: She exhibits no  swelling.     Left ankle: She exhibits no swelling.  Lymphadenopathy:    She has no cervical adenopathy.  Neurological: She is alert and oriented to person, place, and time. No cranial nerve deficit.  Skin: Skin is warm. No rash noted. Nails show no clubbing.  Psychiatric: She has a normal mood and affect.      Data Reviewed: Basic Metabolic Panel: Recent Labs  Lab 04/14/19 1032 04/14/19 1649  04/15/19 2215 04/16/19 0351 04/17/19 0436 04/18/19 0032 04/19/19 0556 04/20/19 0450  NA 135  --    < > 137 139 136 140 137 140  K 2.2* 2.6*   < > 3.8 3.7 3.4* 3.4* 3.7 3.3*  CL 97*  --    < > 111 112* 113* 116* 106 101  CO2 27  --    < > 18* 17* 16* 15* 21* 25  GLUCOSE 113*  --    < > 100* 78 118* 98 149* 156*  BUN 27*  --    < > 32* 31* 58* 68* 63* 66*  CREATININE 1.30*  --    < > 2.28* 2.11* 2.91* 3.82* 4.32* 4.61*  CALCIUM 9.1  --    < > 7.3* 7.3* 6.7* 6.6* 6.1* 6.4*  MG 1.9  1.8  --   --  1.9 2.0  --   --   --   --   PHOS  --  3.0  --   --   --   --   --   --   --    < > =  values in this interval not displayed.   Liver Function Tests: Recent Labs  Lab 04/16/19 0351 04/17/19 0436 04/18/19 0032 04/19/19 0556 04/20/19 0450  AST 270* 482* 485* 204* 133*  ALT 176* 281* 286* 172* 123*  ALKPHOS 173* 181* 321* 423* 573*  BILITOT 2.1* 1.2 1.7* 1.1 1.0  PROT 5.3* 4.2* 4.4* 3.9* 4.6*  ALBUMIN 2.5* 1.8* 1.8* 1.6* 2.1*   Recent Labs  Lab 04/14/19 1032  LIPASE 26   CBC: Recent Labs  Lab 04/14/19 1032  04/16/19 0351 04/17/19 0436  04/17/19 1456  04/18/19 0032 04/18/19 0646  04/19/19 0556 04/19/19 1423 04/19/19 1954 04/20/19 0450 04/20/19 1045  WBC 8.9   < > 7.7 5.5  --   --   --  5.4 7.3  --  9.2  --   --  12.4*  --   NEUTROABS 7.6  --  6.3 4.2  --   --   --   --   --   --  7.1  --   --   --   --   HGB 11.8*   < > 11.6* 7.6*   < >  --    < > 6.6* 8.1*  8.1*   < > 9.6* 9.7* 9.8* 9.3* 10.0*  HCT 35.5*   < > 36.1 24.9*   < >  --    < > 20.4* 24.3*  24.4*   < > 28.4*  27.3* 28.0* 26.4* 28.8*  MCV 93.7   < > 96.3 101.6*  --   --   --  95.3 93.1  --  89.9  --   --  86.8  --   PLT 149*   < > 106* 90*  --  84*  --  81* 85*  --  95*  --   --  126*  --    < > = values in this interval not displayed.   Cardiac Enzymes: Recent Labs  Lab 04/14/19 1032 04/15/19 2215 04/16/19 0351 04/16/19 0946  CKTOTAL  --   --   --  272*  TROPONINI 0.04* <0.03 <0.03 0.03*     Recent Results (from the past 240 hour(s))  SARS Coronavirus 2 (CEPHEID - Performed in [facility] hospital lab), Hosp Order     Status: None   Collection Time: 04/14/19 10:32 AM  Result Value Ref Range Status   SARS Coronavirus 2 NEGATIVE NEGATIVE Final    Comment: (NOTE) If result is NEGATIVE SARS-CoV-2 target nucleic acids are NOT DETECTED. The SARS-CoV-2 RNA is generally detectable in upper and lower  respiratory specimens during the acute phase of infection. The lowest  concentration of SARS-CoV-2 viral copies this assay can detect is 250  copies / mL. A negative result does not preclude SARS-CoV-2 infection  and should not be used as the sole basis for treatment or other  patient management decisions.  A negative result may occur with  improper specimen collection / handling, submission of specimen other  than nasopharyngeal swab, presence of viral mutation(s) within the  areas targeted by this assay, and inadequate number of viral copies  (<250 copies / mL). A negative result must be combined with clinical  observations, patient history, and epidemiological information. If result is POSITIVE SARS-CoV-2 target nucleic acids are DETECTED. The SARS-CoV-2 RNA is generally detectable in upper and lower  respiratory specimens dur ing the acute phase of infection.  Positive  results are indicative of active infection with SARS-CoV-2.  Clinical  correlation with patient history  and other diagnostic information is  necessary to determine patient infection status.  Positive results do  not  rule out bacterial infection or co-infection with other viruses. If result is PRESUMPTIVE POSTIVE SARS-CoV-2 nucleic acids MAY BE PRESENT.   A presumptive positive result was obtained on the submitted specimen  and confirmed on repeat testing.  While 2019 novel coronavirus  (SARS-CoV-2) nucleic acids may be present in the submitted sample  additional confirmatory testing may be necessary for epidemiological  and / or clinical management purposes  to differentiate between  SARS-CoV-2 and other Sarbecovirus currently known to infect humans.  If clinically indicated additional testing with an alternate test  methodology [phone]) is advised. The SARS-CoV-2 RNA is generally  detectable in upper and lower respiratory sp ecimens during the acute  phase of infection. The expected result is Negative. Fact Sheet for Patients:  BoilerBrush.com.cy Fact Sheet for Healthcare Providers: https://pope.com/ This test is not yet approved or cleared by the [place] FDA and has been authorized for detection and/or diagnosis of SARS-CoV-2 by FDA under an Emergency Use Authorization (EUA).  This EUA will remain in effect (meaning this test can be used) for the duration of the COVID-19 declaration under Section 564(b)(1) of the Act, 21 U.S.C. section 360bbb-3(b)(1), unless the authorization is terminated or revoked sooner. Performed at [facility], [address] [place], [place] [address]   MRSA PCR Screening     Status: None   Collection Time: 04/14/19  7:22 PM  Result Value Ref Range Status   MRSA by PCR NEGATIVE NEGATIVE Final    Comment:        The GeneXpert MRSA Assay (FDA approved for NASAL specimens only), is one component of a comprehensive MRSA colonization surveillance program. It is not intended to diagnose MRSA infection nor to guide or monitor treatment for MRSA infections. Performed at [facility], [address] [place], [place] [address]   CULTURE, BLOOD (ROUTINE X 2) w Reflex to ID Panel     Status: None (Preliminary result)   Collection Time: 04/16/19  7:47 AM  Result Value Ref Range Status   Specimen Description BLOOD BLOOD RIGHT HAND  Final   Special Requests   Final    BOTTLES DRAWN AEROBIC AND ANAEROBIC Blood Culture adequate volume   Culture   Final    NO GROWTH 4 DAYS Performed at [facility], [address] [place], [place] [address]    Report Status PENDING  Incomplete  CULTURE, BLOOD (ROUTINE X 2) w Reflex to ID Panel     Status: None (Preliminary result)   Collection Time: 04/16/19  9:27 AM  Result Value Ref Range Status   Specimen Description BLOOD BLOOD RIGHT HAND  Final   Special Requests   Final    BOTTLES DRAWN AEROBIC ONLY Blood Culture adequate volume   Culture   Final    NO GROWTH 4 DAYS Performed at [facility], [address], [place], [place] [address]    Report Status PENDING  Incomplete  MRSA PCR Screening     Status: None   Collection Time: 04/17/19  1:47 PM  Result Value Ref Range Status   MRSA by PCR NEGATIVE NEGATIVE Final    Comment:        The GeneXpert MRSA Assay (FDA approved for NASAL specimens only), is one component of a comprehensive MRSA colonization surveillance program. It is not intended to diagnose MRSA infection nor to guide or monitor treatment for MRSA  infections. Performed at [facility], [address] [place], [place] [address]   Gastrointestinal Panel by PCR , Stool     Status: Abnormal   Collection Time: 04/17/19 10:42 PM  Result Value Ref Range Status   Campylobacter species NOT DETECTED NOT DETECTED Final   Plesimonas shigelloides NOT DETECTED NOT DETECTED Final   Salmonella species NOT DETECTED NOT DETECTED Final   Yersinia enterocolitica NOT DETECTED NOT DETECTED Final   Vibrio species NOT DETECTED NOT DETECTED Final   Vibrio cholerae NOT DETECTED NOT DETECTED Final    Enteroaggregative E coli (EAEC) NOT DETECTED NOT DETECTED Final   Enteropathogenic E coli (EPEC) DETECTED (A) NOT DETECTED Final    Comment: RESULT CALLED TO, READ BACK BY AND VERIFIED WITH: BETH BUONO AT 0211 04/19/2019 SDR    Enterotoxigenic E coli (ETEC) NOT DETECTED NOT DETECTED Final   Shiga like toxin producing E coli (STEC) NOT DETECTED NOT DETECTED Final   Shigella/Enteroinvasive E coli (EIEC) NOT DETECTED NOT DETECTED Final   Cryptosporidium NOT DETECTED NOT DETECTED Final   Cyclospora cayetanensis NOT DETECTED NOT DETECTED Final   Entamoeba histolytica NOT DETECTED NOT DETECTED Final   Giardia lamblia NOT DETECTED NOT DETECTED Final   Adenovirus F40/41 NOT DETECTED NOT DETECTED Final   Astrovirus NOT DETECTED NOT DETECTED Final   Norovirus GI/GII NOT DETECTED NOT DETECTED Final   Rotavirus A NOT DETECTED NOT DETECTED Final   Sapovirus (I, II, IV, and V) NOT DETECTED NOT DETECTED Final    Comment: Performed at [facility], [address] [place], [place] [address]  C difficile quick scan w PCR reflex     Status: None   Collection Time: 04/18/19 10:42 PM  Result Value Ref Range Status   C Diff antigen NEGATIVE NEGATIVE Final   C Diff toxin NEGATIVE NEGATIVE Final   C Diff interpretation No C. difficile detected.  Final    Comment: Performed at [facility], [address] [place], [place] [address]     Studies: [place] Liver Doppler  Result Date: 04/19/2019 CLINICAL DATA:  73 year old female with liver disease EXAM: DUPLEX ULTRASOUND OF LIVER TECHNIQUE: Color and duplex Doppler ultrasound was performed to evaluate the hepatic in-flow and out-flow vessels. COMPARISON:  None. FINDINGS: Portal Vein Velocities Main:  45 cm/sec Right:  30 cm/sec Left:  23 cm/sec Hepatic Vein Velocities Right:  33 cm/sec Middle:  28 cm/sec Left:  70 cm/sec Hepatic Artery Velocity:  90 cm/sec Splenic Vein Velocity:  13 cm/sec Varices: Absent Ascites: Ascites present, scant volume.  Spleen volume 68 cubic cm. Left-sided pleural effusion. Mildly nodular contour of liver parenchyma with increased echogenicity, and no high-resolution images provided. IMPRESSION: Unremarkable directed duplex of the hepatic vasculature. Mildly nodular contour of the liver suggesting developing cirrhosis, though no high-resolution images were performed. Scant ascites. Electronically Signed   By: Gilmer MorJaime  Wagner D.O.   On: 04/19/2019 13:57    Scheduled Meds: . aspirin  81 mg Oral Daily  . Chlorhexidine Gluconate Cloth  6 each Topical Daily  . [START ON 04/21/2019] ciprofloxacin  500 mg Oral Daily  . hydrocortisone sod succinate (SOLU-CORTEF) inj  100 mg Intravenous Q12H  . midodrine  10 mg Oral TID WC  . pantoprazole (PROTONIX) IV  40 mg Intravenous Q12H  . sodium chloride flush  10-40 mL Intracatheter Q12H  . sodium chloride flush  3 mL Intravenous Once  . venlafaxine XR  37.5 mg Oral Q breakfast   Continuous Infusions: . sodium chloride 5  mL/hr at 04/19/19 1800  . albumin human 12.5 g (04/20/19 1144)  . amiodarone 30 mg/hr (04/18/19 0811)  . sodium chloride      Assessment/Plan:  1. Hypovolemic shock.  Holding antihypertensive medications.  Patient also started on Solu-Cortef.  Off pressors at this time 2. 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  Could have HUS.  Antibiotics switched over to Cipro. 3. Acute kidney injury.  Likely secondary to hypovolemic shock and GI losses.  Could be HUS with enteropathogenic E. coli infection.  Nephrology continuing to monitor.  Hopefully creatinine will start to plateau and start to improve. 4. Acute blood loss anemia.  Patient responded to 1 unit of blood 3 days ago hemoglobin trending higher.  5. Atrial fibrillation with rapid ventricular response converted over to normal sinus rhythm with amiodarone.  Now off amiodarone 6. Thrombocytopenia continue to monitor blood counts.  Smear does show some red blood cell fragments.  We  will get oncology opinion. 7. Elevated liver function test.  Likely with hypotension.  Continue to monitor.  Hepatitis panel negative.  Ultrasound concerning for early cirrhosis even though CAT scan did not comment on this.  Code Status:     Code Status Orders  (From admission, onward)         Start     Ordered   04/14/19 1533  Full code  Continuous     04/14/19 1532        Code Status History    This patient has a current code status but no historical code status.     Family Communication: Husband on the phone Disposition Plan: To be determined  Consultants:  Critical care specialist  Gastroenterology  Nephrology  Time spent: 27 minutes.  Spoke with critical care specialist  Irma Roulhac [employer]

## 2019-04-21 ENCOUNTER — Encounter
Admission: EM | Disposition: A | Discharge status: Home Health | Payer: Self-pay | Source: Home / Self Care | Attending: Internal Medicine

## 2019-04-21 ENCOUNTER — Other Ambulatory Visit (INDEPENDENT_AMBULATORY_CARE_PROVIDER_SITE_OTHER): Payer: Self-pay | Admitting: Vascular Surgery

## 2019-04-21 DIAGNOSIS — N189 Chronic kidney disease, unspecified: Secondary | ICD-10-CM

## 2019-04-21 DIAGNOSIS — R7989 Other specified abnormal findings of blood chemistry: Secondary | ICD-10-CM

## 2019-04-21 DIAGNOSIS — R945 Abnormal results of liver function studies: Secondary | ICD-10-CM

## 2019-04-21 DIAGNOSIS — I120 Hypertensive chronic kidney disease with stage 5 chronic kidney disease or end stage renal disease: Secondary | ICD-10-CM

## 2019-04-21 DIAGNOSIS — N179 Acute kidney failure, unspecified: Secondary | ICD-10-CM

## 2019-04-21 HISTORY — PX: TEMPORARY DIALYSIS CATHETER: CATH118312

## 2019-04-21 LAB — CBC WITH DIFFERENTIAL/PLATELET
Abs Immature Granulocytes: 0.67 10*3/uL — ABNORMAL HIGH (ref 0.00–0.07)
Basophils Absolute: 0 10*3/uL (ref 0.0–0.1)
Basophils Relative: 0 %
Eosinophils Absolute: 0 10*3/uL (ref 0.0–0.5)
Eosinophils Relative: 0 %
HCT: 27.3 % — ABNORMAL LOW (ref 36.0–46.0)
Hemoglobin: 9.7 g/dL — ABNORMAL LOW (ref 12.0–15.0)
Immature Granulocytes: 4 %
Lymphocytes Relative: 25 %
Lymphs Abs: 3.8 10*3/uL (ref 0.7–4.0)
MCH: 30.5 pg (ref 26.0–34.0)
MCHC: 35.5 g/dL (ref 30.0–36.0)
MCV: 85.8 fL (ref 80.0–100.0)
Monocytes Absolute: 1 10*3/uL (ref 0.1–1.0)
Monocytes Relative: 7 %
Neutro Abs: 9.9 10*3/uL — ABNORMAL HIGH (ref 1.7–7.7)
Neutrophils Relative %: 64 %
Platelets: 152 10*3/uL (ref 150–400)
RBC: 3.18 MIL/uL — ABNORMAL LOW (ref 3.87–5.11)
RDW: 14.2 % (ref 11.5–15.5)
WBC Morphology: ABNORMAL
WBC: 15.4 10*3/uL — ABNORMAL HIGH (ref 4.0–10.5)
nRBC: 0 % (ref 0.0–0.2)

## 2019-04-21 LAB — COMPREHENSIVE METABOLIC PANEL
ALT: 105 U/L — ABNORMAL HIGH (ref 0–44)
AST: 113 U/L — ABNORMAL HIGH (ref 15–41)
Albumin: 2.5 g/dL — ABNORMAL LOW (ref 3.5–5.0)
Alkaline Phosphatase: 663 U/L — ABNORMAL HIGH (ref 38–126)
Anion gap: 13 (ref 5–15)
BUN: 65 mg/dL — ABNORMAL HIGH (ref 8–23)
CO2: 25 mmol/L (ref 22–32)
Calcium: 7.2 mg/dL — ABNORMAL LOW (ref 8.9–10.3)
Chloride: 102 mmol/L (ref 98–111)
Creatinine, Ser: 5.04 mg/dL — ABNORMAL HIGH (ref 0.44–1.00)
GFR calc Af Amer: 9 mL/min — ABNORMAL LOW (ref >60)
GFR calc non Af Amer: 8 mL/min — ABNORMAL LOW (ref >60)
Glucose, Bld: 109 mg/dL — ABNORMAL HIGH (ref 70–99)
Potassium: 3.7 mmol/L (ref 3.5–5.1)
Sodium: 140 mmol/L (ref 135–145)
Total Bilirubin: 1 mg/dL (ref 0.3–1.2)
Total Protein: 5.1 g/dL — ABNORMAL LOW (ref 6.5–8.1)

## 2019-04-21 LAB — ENA+DNA/DS+ANTICH+CENTRO+JO...
Anti JO-1: <0.2 AI (ref 0.0–0.9)
Centromere Ab Screen: <0.2 AI (ref 0.0–0.9)
Chromatin Ab SerPl-aCnc: 0.2 AI (ref 0.0–0.9)
ENA SM Ab Ser-aCnc: <0.2 AI (ref 0.0–0.9)
Ribonucleic Protein: <0.2 AI (ref 0.0–0.9)
SSA (Ro) (ENA) Antibody, IgG: 1.4 AI — ABNORMAL HIGH (ref 0.0–0.9)
SSB (La) (ENA) Antibody, IgG: <0.2 AI (ref 0.0–0.9)
Scleroderma (Scl-70) (ENA) Antibody, IgG: <0.2 AI (ref 0.0–0.9)
ds DNA Ab: <1 IU/mL (ref 0–9)

## 2019-04-21 LAB — CULTURE, BLOOD (ROUTINE X 2)
Culture: NO GROWTH
Culture: NO GROWTH
Special Requests: ADEQUATE
Special Requests: ADEQUATE

## 2019-04-21 LAB — HEPATITIS C VRS RNA DETECT BY PCR-QUAL: Hepatitis C Vrs RNA by PCR-Qual: NEGATIVE

## 2019-04-21 LAB — ANA W/REFLEX IF POSITIVE: Anti Nuclear Antibody (ANA): POSITIVE — AB

## 2019-04-21 SURGERY — TEMPORARY DIALYSIS CATHETER
Anesthesia: Moderate Sedation

## 2019-04-21 MED ORDER — MIDAZOLAM HCL 2 MG/ML PO SYRP
ORAL_SOLUTION | ORAL | Status: AC
  Filled 2019-04-21: qty 4

## 2019-04-21 MED ORDER — HYDROCORTISONE NA SUCCINATE PF 100 MG IJ SOLR
25.0000 mg | Freq: Two times a day (BID) | INTRAMUSCULAR | Status: DC
  Administered 2019-04-22 – 2019-04-23 (×4): 25 mg via INTRAVENOUS
  Filled 2019-04-21 (×6): qty 0.5

## 2019-04-21 MED ORDER — FUROSEMIDE 10 MG/ML IJ SOLN
40.0000 mg | Freq: Once | INTRAMUSCULAR | Status: AC
  Administered 2019-04-21: 14:00:00 40 mg via INTRAVENOUS
  Filled 2019-04-21: qty 4

## 2019-04-21 MED ORDER — TETANUS-DIPHTH-ACELL PERTUSSIS 5-2.5-18.5 LF-MCG/0.5 IM SUSP
0.5000 mL | INTRAMUSCULAR | Status: AC | PRN
  Administered 2019-04-22: 16:00:00 0.5 mL via INTRAMUSCULAR
  Filled 2019-04-21: qty 0.5

## 2019-04-21 MED ORDER — MIDAZOLAM HCL 2 MG/ML PO SYRP
8.0000 mg | ORAL_SOLUTION | Freq: Once | ORAL | Status: AC | PRN
  Administered 2019-04-21: 8 mg via ORAL

## 2019-04-21 SURGICAL SUPPLY — 2 items
COVER PROBE U/S 5X48 (MISCELLANEOUS) ×3 IMPLANT
KIT DIALYSIS CATH TRI 30X13 (CATHETERS) ×3 IMPLANT

## 2019-04-21 NOTE — Consult Note (Signed)
[facility] Vascular Consult Note  MRN : [id number]  Sarah EmeraldLamai Sullivan is a 73 y.o. (04-Sep-1946) female who presents with chief complaint of:   Chief Complaint  Patient presents with  . Nausea  . Emesis   History of Present Illness:  The patient is a 73 year old female with a past medical history of hypertension, colitis, dehydration, thrombocytopenia, degenerative disc disease, lumbar radiculitis, history of depression, osteopenia and acute renal failure who initially [facility] via EMS complaining of progressively worsening nausea and vomiting for three days.  Upon presentation, she was found to have acute renal failure suspect secondary to ATN due to volume loss with a BUN of 27 and creatinine 1.3 however during her hospital stay her renal parameters have worsened. Creatinine / BUN today is: 5.04 / 65.   The nephrology service has decided to initiate dialysis at this time, and we are asked to place a temporary dialysis catheter for immediate dialysis use.    Vascular surgery was consulted by Dr. Wynelle LinkKolluru for temporary dialysis catheter placement. Current Facility-Administered Medications  Medication Dose Route Frequency Provider Last Rate Last Dose  . 0.9 %  sodium chloride infusion   Intravenous PRN Vida RiggerAleskerov, Fuad, MD   Stopped at 04/20/19 1419  . albumin human 25 % solution 12.5 g  12.5 g Intravenous Q6H Kolluru, Threasa HeadsSarath, MD   Stopped at 04/21/19 1129  . amiodarone (NEXTERONE PREMIX) 360-4.14 MG/200ML-% (1.8 mg/mL) IV infusion  30 mg/hr Intravenous Continuous Eugenie NorrieBlakeney, Dana G, NP 16.67 mL/hr at 04/18/19 0811 30 mg/hr at 04/18/19 0811  . aspirin chewable tablet 81 mg  81 mg Oral Daily Pyreddy, Vivien RotaPavan, MD   81 mg at 04/21/19 1005  . Chlorhexidine Gluconate Cloth 2 % PADS 6 each  6 each Topical Daily Judithe ModestKeene, Jeremiah D, NP   6 each at 04/21/19 1005  . ciprofloxacin (CIPRO) tablet 500 mg  500 mg Oral Daily  Foye DeerKluttz, Lisa G, RPH   500 mg at 04/21/19 1005  . hydrocortisone sodium succinate (SOLU-CORTEF) 100 MG injection 100 mg  100 mg Intravenous Q12H Vida RiggerAleskerov, Fuad, MD   100 mg at 04/21/19 1129  . meclizine (ANTIVERT) tablet 12.5 mg  12.5 mg Oral TID PRN Salena SanerGonzalez, Carmen L, MD      . midodrine (PROAMATINE) tablet 10 mg  10 mg Oral TID WC Aleskerov, Fuad, MD   10 mg at 04/21/19 1005  . ondansetron (ZOFRAN) tablet 4 mg  4 mg Oral Q6H PRN Ihor AustinPyreddy, Pavan, MD       Or  . ondansetron (ZOFRAN) injection 4 mg  4 mg Intravenous Q6H PRN Ihor AustinPyreddy, Pavan, MD   4 mg at 04/19/19 1629  . pantoprazole (PROTONIX) injection 40 mg  40 mg Intravenous Q12H Toney ReilVanga, Rohini Reddy, MD   40 mg at 04/21/19 1005  . senna-docusate (Senokot-S) tablet 1 tablet  1 tablet Oral QHS PRN Pyreddy, Pavan, MD      . sodium chloride 0.9 % bolus 1,000 mL  1,000 mL Intravenous Once Mayo, Allyn KennerKaty Dodd, MD      . sodium chloride flush (NS) 0.9 % injection 10-40 mL  10-40 mL Intracatheter Q12H Harlon DittyKeene, Jeremiah D, NP   10 mL at 04/21/19 1006  . sodium chloride flush (NS) 0.9 % injection 10-40 mL  10-40 mL Intracatheter PRN Harlon DittyKeene, Jeremiah D, NP      . sodium chloride flush (NS) 0.9 % injection 3 mL  3 mL Intravenous Once Jene EveryKinner, Robert, MD      .  venlafaxine XR (EFFEXOR-XR) 24 hr capsule 37.5 mg  37.5 mg Oral Q breakfast Pyreddy, Vivien Rota, MD   37.5 mg at 04/21/19 1005   Past Medical History:  Diagnosis Date  . Hypertension    Past Surgical History:  Procedure Laterality Date  . ABDOMINAL HYSTERECTOMY     Social History Social History   Tobacco Use  . Smoking status: Never Smoker  . Smokeless tobacco: Never Used  Substance Use Topics  . Alcohol use: Not Currently  . Drug use: Not Currently   Family History History reviewed. No pertinent family history.  Patient denies any family history of peripheral artery disease, renal disease and/or bleeding/clotting disorders.  No Known Allergies  REVIEW OF SYSTEMS (Negative unless  checked)  Constitutional: Weight loss  Fever  Chills Cardiac: Chest pain   Chest pressure   Palpitations   Shortness of breath when laying flat   Shortness of breath at rest   Shortness of breath with exertion. Vascular:  Pain in legs with walking   Pain in legs at rest   Pain in legs when laying flat   Claudication   Pain in feet when walking  Pain in feet at rest  Pain in feet when laying flat   History of DVT   Phlebitis   Swelling in legs   Varicose veins   Non-healing ulcers Pulmonary:   Uses home oxygen   Productive cough   Hemoptysis   Wheeze  COPD   Asthma Neurologic:  Dizziness  Blackouts   Seizures   History of stroke   History of TIA  Aphasia   Temporary blindness   Dysphagia   Weakness or numbness in arms   Weakness or numbness in legs Musculoskeletal:  Arthritis   Joint swelling   Joint pain   Low back pain Hematologic:  Easy bruising  Easy bleeding   Hypercoagulable state   Anemic  Hepatitis Gastrointestinal:  Blood in stool   Vomiting blood  Gastroesophageal reflux/heartburn   Difficulty swallowing. Genitourinary:  Chronic kidney disease   Difficult urination  Frequent urination  Burning with urination   Blood in urine Skin:  Rashes   Ulcers   Wounds Psychological:  History of anxiety    History of major depression.  Physical Examination  Vitals:   04/20/19 1300 04/20/19 1431 04/20/19 2128 04/21/19 0516  BP: (!) 143/63 (!) 172/74 (!) 184/75 (!) 177/73  Pulse: (!) 52 (!) 49 (!) 51 (!) 57  Resp: Temp:  97.6 F (36.4 C) 97.6 F (36.4 C) (!) 97.5 F (36.4 C)  TempSrc:  Oral Oral Oral  SpO2: 95% 98% 99% 97%  Weight:      Height:       Body mass index is 25.56 kg/m. Gen: WD/WN Head: [place]/AT, No temporalis wasting Ear/Nose/Throat: Hearing grossly intact, nares w/o erythema or drainage Eyes: Sclera non-icteric, conjunctiva clear Neck:  Supple, no nuchal rigidity.  No JVD.  Pulmonary:  Good air movement, clear to auscultation bilaterally.  Cardiac: RRR, normal S1, S2, no Murmurs, rubs or gallops. Vascular: Extremities are warm and non-tender Gastrointestinal: soft, non-tender/non-distended. No guarding/reflex.  Musculoskeletal: M/S 5/5 throughout.  Extremities without ischemic changes.  No deformity or atrophy. Mild edema in the lower extremities bilaterally Neurologic: Intact.  Psychiatric: Difficult to assess due to the severity of patient's illness. Dermatologic: No rashes or ulcers noted.   Lymph : No Cervical, Axillary, or Inguinal lymphadenopathy.  CBC Lab Results  Component Value Date   WBC 15.4 (H)  04/21/2019   HGB 9.7 (L) 04/21/2019   HCT 27.3 (L) 04/21/2019   MCV 85.8 04/21/2019   PLT 152 04/21/2019   BMET    Component Value Date/Time   NA 140 04/21/2019 0352   NA 141 12/25/2014 1437   K 3.7 04/21/2019 0352   K 3.5 12/25/2014 1437   CL 102 04/21/2019 0352   CL 105 12/25/2014 1437   CO2 25 04/21/2019 0352   CO2 28 12/25/2014 1437   GLUCOSE 109 (H) 04/21/2019 0352   GLUCOSE 108 (H) 12/25/2014 1437   BUN 65 (H) 04/21/2019 0352   BUN 15 12/25/2014 1437   CREATININE 5.04 (H) 04/21/2019 0352   CREATININE 0.78 12/25/2014 1437   CALCIUM 7.2 (L) 04/21/2019 0352   CALCIUM 9.2 12/25/2014 1437   GFRNONAA 8 (L) 04/21/2019 0352   GFRNONAA >60 12/25/2014 1437   GFRAA 9 (L) 04/21/2019 0352   GFRAA >60 12/25/2014 1437   Estimated Creatinine Clearance: 7.7 mL/min (A) (by C-G formula based on SCr of 5.04 mg/dL (H)).  COAG Lab Results  Component Value Date   INR 0.9 04/20/2019   INR 1.0 04/17/2019   Radiology Ct Abdomen Pelvis Wo Contrast  Result Date: 04/15/2019 CLINICAL DATA:  Nausea and vomiting, history of hypertension, dizziness. EXAM: CT ABDOMEN AND PELVIS WITHOUT CONTRAST TECHNIQUE: Multidetector CT imaging of the abdomen and pelvis was performed following the standard protocol without IV  contrast. COMPARISON:  None. FINDINGS: Lower chest: No acute abnormality. Hepatobiliary: No focal liver abnormality is seen. No gallstones, gallbladder wall thickening, or biliary dilatation. Pancreas: Unremarkable. No pancreatic ductal dilatation or surrounding inflammatory changes. Spleen: Normal in size without focal abnormality. Adrenals/Urinary Tract: Adrenal glands appear normal. Kidneys are unremarkable without mass, stone or hydronephrosis. No perinephric fluid. No ureteral or bladder calculi identified. Bladder appears normal, partially decompressed. Stomach/Bowel: No dilated large or small bowel loops. Questionable thickening of the walls of the RIGHT colon and transverse colon with mild pericolonic inflammation suggesting acute colitis. Appendix is normal. Stomach is unremarkable, partially decompressed. Vascular/Lymphatic: Aortic atherosclerosis. No enlarged lymph nodes seen. Reproductive: Presumed hysterectomy. No adnexal mass or free fluid seen. Other: No free fluid or abscess collection. No free intraperitoneal air. Musculoskeletal: No acute or suspicious osseous finding. Fixation hardware at the L3 through L5 levels appears intact and appropriately position. IMPRESSION: 1. Suspected mild colitis of the transverse colon and RIGHT colon. 2. Remainder of the abdomen and pelvis CT is unremarkable. No bowel obstruction. No free fluid or abscess collection. No evidence of acute solid organ abnormality. Appendix is normal. Aortic Atherosclerosis (ICD10-I70.0). Electronically Signed   By: Bary Richard M.D.   On: 04/15/2019 11:53   Dg Abd 1 View  Result Date: 04/18/2019 CLINICAL DATA:  Right femoral line placement EXAM: ABDOMEN - 1 VIEW COMPARISON:  Dominant CT 3 days ago FINDINGS: Right femoral line. The tip is obscured by lumbar fusion hardware but is likely at the level of the iliac confluence. Mild gaseous distension of bowel, question mild ileus. Probable anasarca seen at the right flank.  IMPRESSION: 1. Femoral line with tip obscured by lumbar fusion hardware. The tip is either at the iliac confluence or IVC. 2. Mild generalized gaseous distension of bowel, there may be developing ileus. Electronically Signed   By: Marnee Spring M.D.   On: 04/18/2019 04:27   Dg Abd 1 View  Result Date: 04/14/2019 CLINICAL DATA:  Nausea and vomiting for 3 days. EXAM: ABDOMEN - 1 VIEW COMPARISON:  Chest radiograph same date. FINDINGS: The  bowel gas pattern is normal. There is mildly prominent stool in the distal colon. There is no supine evidence of free intraperitoneal air or suspicious abdominal calcification. There are postsurgical changes in the lumbar spine status post L3 through 5 fusion. No acute osseous findings. IMPRESSION: No acute abdominal findings. Mildly increased distal colonic stool burden, suggesting constipation. Electronically Signed   By: Carey Bullocks M.D.   On: 04/14/2019 14:32   US Renal  Result Date: 04/17/2019 CLINICAL DATA:  Acute renal failure EXAM: RENAL / URINARY TRACT ULTRASOUND COMPLETE COMPARISON:  CT from 04/15/2019 FINDINGS: Right Kidney: Renal measurements: 9.8 x 4.5 x 4.7 cm. = volume: 107 mL . Echogenicity within normal limits. No mass or hydronephrosis visualized. Left Kidney: Renal measurements: 9.7 x 5.1 x 5.6 cm. = volume: 145 mL. Echogenicity within normal limits. No mass or hydronephrosis visualized. Bladder: Appears normal for degree of bladder distention. Increased echogenicity of the liver is noted consistent with fatty infiltration. Minimal free fluid is noted adjacent to the liver. IMPRESSION: Kidneys are within normal limits. Fatty infiltration of the liver. Minimal free fluid in the abdomen. Electronically Signed   By: Alcide Clever M.D.   On: 04/17/2019 21:08   Dg Chest Port 1 View  Result Date: 04/14/2019 CLINICAL DATA:  Weakness.  Nausea and vomiting for 3 days. EXAM: PORTABLE CHEST 1 VIEW COMPARISON:  None. FINDINGS: The cardiomediastinal silhouette  is within normal limits. The lungs are mildly hypoinflated with mild bronchovascular crowding in the lung bases. No airspace consolidation, edema, pleural effusion, pneumothorax is identified. No acute osseous abnormality is seen. IMPRESSION: No active disease. Electronically Signed   By: Sebastian Ache M.D.   On: 04/14/2019 11:15   US Liver Doppler  Result Date: 04/19/2019 CLINICAL DATA:  73 year old female with liver disease EXAM: DUPLEX ULTRASOUND OF LIVER TECHNIQUE: Color and duplex Doppler ultrasound was performed to evaluate the hepatic in-flow and out-flow vessels. COMPARISON:  None. FINDINGS: Portal Vein Velocities Main:  45 cm/sec Right:  30 cm/sec Left:  23 cm/sec Hepatic Vein Velocities Right:  33 cm/sec Middle:  28 cm/sec Left:  70 cm/sec Hepatic Artery Velocity:  90 cm/sec Splenic Vein Velocity:  13 cm/sec Varices: Absent Ascites: Ascites present, scant volume. Spleen volume 68 cubic cm. Left-sided pleural effusion. Mildly nodular contour of liver parenchyma with increased echogenicity, and no high-resolution images provided. IMPRESSION: Unremarkable directed duplex of the hepatic vasculature. Mildly nodular contour of the liver suggesting developing cirrhosis, though no high-resolution images were performed. Scant ascites. Electronically Signed   By: Gilmer Mor D.O.   On: 04/19/2019 13:57   Assessment/Plan The patient is a 73 year old female with a past medical history of hypertension, colitis, dehydration, thrombocytopenia, degenerative disc disease, lumbar radiculitis, history of depression, osteopenia and acute renal failure  1. Acute on chronic Renal Failure: Upon presentation, she was found to have acute renal failure suspect secondary to ATN due to volume loss with a BUN of 27 and creatinine 1.3 however during her hospital stay her renal parameters have worsened. Creatinine / BUN today is: 5.04 / 65.  Nephrology would like to initiate dialysis at this time however the patient does not  have an adequate access. We will proceed with temporary dialysis catheter placement at this time.  Risks and benefits discussed with patient and/or family, and the catheter will be placed to allow immediate initiation of dialysis.  If the patient's renal function does not improve throughout the hospital course, we will be happy to place a tunneled dialysis catheter  for long term use prior to discharge.  Procedure, risks and benefits explained to the patient.  All questions answered. The patient was to proceed. 2. Hypertension: On appropriate medications.  Encouraged good control as its slows the progression of atherosclerotic disease and directly affects renal function.  Discussed with Dr. Weldon Inches, PA-C  04/21/2019 11:58 AM

## 2019-04-21 NOTE — Progress Notes (Signed)
[facility]  ROUNDING NOTE   Subjective:   Transferred out of ICU  Creatinine 5.04 (4.61) (4.32) (3.82)  Objective:  Vital signs in last 24 hours:  Temp:  [97.5 F (36.4 C)-97.6 F (36.4 C)] 97.5 F (36.4 C) (05/29 0516) Pulse Rate:  [49-57] 57 (05/29 0516) Resp:  [14-20] 18 (05/29 0516) BP: (143-184)/(63-75) 177/73 (05/29 0516) SpO2:  [95 %-99 %] 97 % (05/29 0516)  Weight change:  Filed Weights   04/16/19 0010 04/17/19 0301 04/17/19 1246  Weight: 51 kg 55.1 kg 57.4 kg    Intake/Output: I/O last 3 completed shifts: In: 1579.5 [I.V.:1428.4; IV Piggyback:151.2] Out: 820 [Urine:820]   Intake/Output this shift:  No intake/output data recorded.  Physical Exam: General: NAD,   Head: Normocephalic, atraumatic. Moist oral mucosal membranes  Eyes: Anicteric, PERRL  Neck: Supple, trachea midline  Lungs:  Clear to auscultation  Heart: irregular  Abdomen:  Mild distension, nontender  Extremities: ++ peripheral edema.  Neurologic: Nonfocal, moving all four extremities  Skin: No lesions        Basic Metabolic Panel: Recent Labs  Lab 04/14/19 1649  04/15/19 2215 04/16/19 0351 04/17/19 0436 04/18/19 0032 04/19/19 0556 04/20/19 0450 04/21/19 0352  NA  --    < > 137 139 136 140 137 140 140  K 2.6*   < > 3.8 3.7 3.4* 3.4* 3.7 3.3* 3.7  CL  --    < > 111 112* 113* 116* 106 101 102  CO2  --    < > 18* 17* 16* 15* 21* 25 25  GLUCOSE  --    < > 100* 78 118* 98 149* 156* 109*  BUN  --    < > 32* 31* 58* 68* 63* 66* 65*  CREATININE  --    < > 2.28* 2.11* 2.91* 3.82* 4.32* 4.61* 5.04*  CALCIUM  --    < > 7.3* 7.3* 6.7* 6.6* 6.1* 6.4* 7.2*  MG  --   --  1.9 2.0  --   --   --   --   --   PHOS 3.0  --   --   --   --   --   --   --   --    < > = values in this interval not displayed.    Liver Function Tests: Recent Labs  Lab 04/17/19 0436 04/18/19 0032 04/19/19 0556 04/20/19 0450 04/21/19 0352  AST 482* 485* 204* 133* 113*  ALT 281* 286* 172* 123* 105*   ALKPHOS 181* 321* 423* 573* 663*  BILITOT 1.2 1.7* 1.1 1.0 1.0  PROT 4.2* 4.4* 3.9* 4.6* 5.1*  ALBUMIN 1.8* 1.8* 1.6* 2.1* 2.5*   No results for input(s): LIPASE, AMYLASE in the last 168 hours. No results for input(s): AMMONIA in the last 168 hours.  CBC: Recent Labs  Lab 04/16/19 0351 04/17/19 0436  04/18/19 0032 04/18/19 0646  04/19/19 0556 04/19/19 1423 04/19/19 1954 04/20/19 0450 04/20/19 1045 04/21/19 0352  WBC 7.7 5.5  --  5.4 7.3  --  9.2  --   --  12.4*  --  15.4*  NEUTROABS 6.3 4.2  --   --   --   --  7.1  --   --   --   --  9.9*  HGB 11.6* 7.6*   < > 6.6* 8.1*  8.1*   < > 9.6* 9.7* 9.8* 9.3* 10.0* 9.7*  HCT 36.1 24.9*   < > 20.4* 24.3*  24.4*   < >  28.4* 27.3* 28.0* 26.4* 28.8* 27.3*  MCV 96.3 101.6*  --  95.3 93.1  --  89.9  --   --  86.8  --  85.8  PLT 106* 90*   < > 81* 85*  --  95*  --   --  126*  --  152   < > = values in this interval not displayed.    Cardiac Enzymes: Recent Labs  Lab 04/15/19 2215 04/16/19 0351 04/16/19 0946  CKTOTAL  --   --  272*  TROPONINI <0.03 <0.03 0.03*    BNP: Invalid input(s): POCBNP  CBG: No results for input(s): GLUCAP in the last 168 hours.  Microbiology: Results for orders placed or performed during the hospital encounter of 04/14/19  SARS Coronavirus 2 (CEPHEID - Performed in [facility] hospital lab), Hosp Order     Status: None   Collection Time: 04/14/19 10:32 AM  Result Value Ref Range Status   SARS Coronavirus 2 NEGATIVE NEGATIVE Final    Comment: (NOTE) If result is NEGATIVE SARS-CoV-2 target nucleic acids are NOT DETECTED. The SARS-CoV-2 RNA is generally detectable in upper and lower  respiratory specimens during the acute phase of infection. The lowest  concentration of SARS-CoV-2 viral copies this assay can detect is 250  copies / mL. A negative result does not preclude SARS-CoV-2 infection  and should not be used as the sole basis for treatment or other  patient management decisions.  A negative  result may occur with  improper specimen collection / handling, submission of specimen other  than nasopharyngeal swab, presence of viral mutation(s) within the  areas targeted by this assay, and inadequate number of viral copies  (<250 copies / mL). A negative result must be combined with clinical  observations, patient history, and epidemiological information. If result is POSITIVE SARS-CoV-2 target nucleic acids are DETECTED. The SARS-CoV-2 RNA is generally detectable in upper and lower  respiratory specimens dur ing the acute phase of infection.  Positive  results are indicative of active infection with SARS-CoV-2.  Clinical  correlation with patient history and other diagnostic information is  necessary to determine patient infection status.  Positive results do  not rule out bacterial infection or co-infection with other viruses. If result is PRESUMPTIVE POSTIVE SARS-CoV-2 nucleic acids MAY BE PRESENT.   A presumptive positive result was obtained on the submitted specimen  and confirmed on repeat testing.  While 2019 novel coronavirus  (SARS-CoV-2) nucleic acids may be present in the submitted sample  additional confirmatory testing may be necessary for epidemiological  and / or clinical management purposes  to differentiate between  SARS-CoV-2 and other Sarbecovirus currently known to infect humans.  If clinically indicated additional testing with an alternate test  methodology [phone]) is advised. The SARS-CoV-2 RNA is generally  detectable in upper and lower respiratory sp ecimens during the acute  phase of infection. The expected result is Negative. Fact Sheet for Patients:  BoilerBrush.com.cy Fact Sheet for Healthcare Providers: https://pope.com/ This test is not yet approved or cleared by the [place] FDA and has been authorized for detection and/or diagnosis of SARS-CoV-2 by FDA under an Emergency Use Authorization  (EUA).  This EUA will remain in effect (meaning this test can be used) for the duration of the COVID-19 declaration under Section 564(b)(1) of the Act, 21 U.S.C. section 360bbb-3(b)(1), unless the authorization is terminated or revoked sooner. Performed at [facility], [address], [place], [place] [address]   MRSA PCR Screening  Status: None   Collection Time: 04/14/19  7:22 PM  Result Value Ref Range Status   MRSA by PCR NEGATIVE NEGATIVE Final    Comment:        The GeneXpert MRSA Assay (FDA approved for NASAL specimens only), is one component of a comprehensive MRSA colonization surveillance program. It is not intended to diagnose MRSA infection nor to guide or monitor treatment for MRSA infections. Performed at [facility], [address] [place], [place] [address]   CULTURE, BLOOD (ROUTINE X 2) w Reflex to ID Panel     Status: None   Collection Time: 04/16/19  7:47 AM  Result Value Ref Range Status   Specimen Description BLOOD BLOOD RIGHT HAND  Final   Special Requests   Final    BOTTLES DRAWN AEROBIC AND ANAEROBIC Blood Culture adequate volume   Culture   Final    NO GROWTH 5 DAYS Performed at [facility], [address] [place], [place] [address]    Report Status 04/21/2019 FINAL  Final  CULTURE, BLOOD (ROUTINE X 2) w Reflex to ID Panel     Status: None   Collection Time: 04/16/19  9:27 AM  Result Value Ref Range Status   Specimen Description BLOOD BLOOD RIGHT HAND  Final   Special Requests   Final    BOTTLES DRAWN AEROBIC ONLY Blood Culture adequate volume   Culture   Final    NO GROWTH 5 DAYS Performed at [facility], [address] [place], [place] [address]    Report Status 04/21/2019 FINAL  Final  MRSA PCR Screening     Status: None   Collection Time: 04/17/19  1:47 PM  Result Value Ref Range Status   MRSA by PCR NEGATIVE NEGATIVE Final    Comment:        The GeneXpert MRSA Assay (FDA approved for  NASAL specimens only), is one component of a comprehensive MRSA colonization surveillance program. It is not intended to diagnose MRSA infection nor to guide or monitor treatment for MRSA infections. Performed at [facility], [address] [place], [place] [address]   Gastrointestinal Panel by PCR , Stool     Status: Abnormal   Collection Time: 04/17/19 10:42 PM  Result Value Ref Range Status   Campylobacter species NOT DETECTED NOT DETECTED Final   Plesimonas shigelloides NOT DETECTED NOT DETECTED Final   Salmonella species NOT DETECTED NOT DETECTED Final   Yersinia enterocolitica NOT DETECTED NOT DETECTED Final   Vibrio species NOT DETECTED NOT DETECTED Final   Vibrio cholerae NOT DETECTED NOT DETECTED Final   Enteroaggregative E coli (EAEC) NOT DETECTED NOT DETECTED Final   Enteropathogenic E coli (EPEC) DETECTED (A) NOT DETECTED Final    Comment: RESULT CALLED TO, READ BACK BY AND VERIFIED WITH: BETH BUONO AT 0211 04/19/2019 SDR    Enterotoxigenic E coli (ETEC) NOT DETECTED NOT DETECTED Final   Shiga like toxin producing E coli (STEC) NOT DETECTED NOT DETECTED Final   Shigella/Enteroinvasive E coli (EIEC) NOT DETECTED NOT DETECTED Final   Cryptosporidium NOT DETECTED NOT DETECTED Final   Cyclospora cayetanensis NOT DETECTED NOT DETECTED Final   Entamoeba histolytica NOT DETECTED NOT DETECTED Final   Giardia lamblia NOT DETECTED NOT DETECTED Final   Adenovirus F40/41 NOT DETECTED NOT DETECTED Final   Astrovirus NOT DETECTED NOT DETECTED Final   Norovirus GI/GII NOT DETECTED NOT DETECTED Final   Rotavirus A NOT DETECTED NOT DETECTED Final   Sapovirus (I, II, IV,  and V) NOT DETECTED NOT DETECTED Final    Comment: Performed at [facility], [address] [place], [place] [address]  C difficile quick scan w PCR reflex     Status: None   Collection Time: 04/18/19 10:42 PM  Result Value Ref Range Status   C Diff antigen NEGATIVE NEGATIVE Final   C Diff  toxin NEGATIVE NEGATIVE Final   C Diff interpretation No C. difficile detected.  Final    Comment: Performed at [facility], [address] [place], [place] [address]    Coagulation Studies: Recent Labs    04/20/19 1741  LABPROT 11.9  INR 0.9    Urinalysis: No results for input(s): COLORURINE, LABSPEC, PHURINE, GLUCOSEU, HGBUR, BILIRUBINUR, KETONESUR, PROTEINUR, UROBILINOGEN, NITRITE, LEUKOCYTESUR in the last 72 hours.  Invalid input(s): APPERANCEUR    Imaging: US Liver Doppler  Result Date: 04/19/2019 CLINICAL DATA:  73 year old female with liver disease EXAM: DUPLEX ULTRASOUND OF LIVER TECHNIQUE: Color and duplex Doppler ultrasound was performed to evaluate the hepatic in-flow and out-flow vessels. COMPARISON:  None. FINDINGS: Portal Vein Velocities Main:  45 cm/sec Right:  30 cm/sec Left:  23 cm/sec Hepatic Vein Velocities Right:  33 cm/sec Middle:  28 cm/sec Left:  70 cm/sec Hepatic Artery Velocity:  90 cm/sec Splenic Vein Velocity:  13 cm/sec Varices: Absent Ascites: Ascites present, scant volume. Spleen volume 68 cubic cm. Left-sided pleural effusion. Mildly nodular contour of liver parenchyma with increased echogenicity, and no high-resolution images provided. IMPRESSION: Unremarkable directed duplex of the hepatic vasculature. Mildly nodular contour of the liver suggesting developing cirrhosis, though no high-resolution images were performed. Scant ascites. Electronically Signed   By: Gilmer Mor D.O.   On: 04/19/2019 13:57     Medications:   . sodium chloride Stopped (04/20/19 1419)  . albumin human Stopped (04/21/19 1129)  . amiodarone 30 mg/hr (04/18/19 0811)  . sodium chloride     . aspirin  81 mg Oral Daily  . Chlorhexidine Gluconate Cloth  6 each Topical Daily  . ciprofloxacin  500 mg Oral Daily  . hydrocortisone sod succinate (SOLU-CORTEF) inj  100 mg Intravenous Q12H  . midodrine  10 mg Oral TID WC  . pantoprazole (PROTONIX) IV  40 mg Intravenous  Q12H  . sodium chloride flush  10-40 mL Intracatheter Q12H  . sodium chloride flush  3 mL Intravenous Once  . venlafaxine XR  37.5 mg Oral Q breakfast   sodium chloride, meclizine, ondansetron **OR** ondansetron (ZOFRAN) IV, senna-docusate, sodium chloride flush  Assessment/ Plan:  Ms. Sarah Sullivan is a 73 y.o. Asian female with a PMHx of hypertension, degenerative disc disease, lumbar radiculitis, history of depression, osteopenia, who was admitted to [facility] on 04/14/2019 for evaluation of nausea, vomiting, and dizziness.   1.  Acute renal failure with metabolic acidosis: secondary to prerenal azotemia, GI losses and blood losses. ATN. Nonoliguric urine output.  Hematuria and proteinuria on admission.  Creatinine continues to rise. No indication for dialysis at this time. But may need soon. Will get dialysis access placed.  Nonoliguric urine output.  - IV furosemide bolus - IV albumin to assist with 3rd spacing.   2. Hypotension with atrial fibrillation. Monitor with furosemide  3. Anemia with renal failure: status post 1 unit PRBC 5/26. With thrombocytopenia   LOS: 5 Sarah Sullivan 5/29/202012:59 PM

## 2019-04-21 NOTE — Plan of Care (Signed)

## 2019-04-21 NOTE — Op Note (Signed)
  OPERATIVE NOTE   PROCEDURE: 1. Ultrasound guidance for vascular access left femoral vein 2. Placement of a 30 cm triple lumen dialysis catheter left femoral vein  PRE-OPERATIVE DIAGNOSIS: 1. Acute renal failure  POST-OPERATIVE DIAGNOSIS: Same  SURGEON: Festus Barren, MD  ASSISTANT(S): None  ANESTHESIA: local  ESTIMATED BLOOD LOSS: Minimal   FINDING(S): 1. None  SPECIMEN(S): None  INDICATIONS:  Patient is a 73 y.o.female who presents with renal failure and now needs dialysis.  Risks and benefits were discussed, and informed consent was obtained..  DESCRIPTION: After obtaining full informed written consent, the patient was laid flat in the bed. The left groin was sterilely prepped and draped in a sterile surgical field was created. The left femoral vein was visualized with ultrasound and found to be widely patent. It was then accessed under direct guidance without difficulty with a Seldinger needle and a permanent image was recorded. A J-wire was then placed. After skin nick and dilatation, a 30 cm triple lumen dialysis catheter was placed over the wire and the wire was removed. The lumens withdrew dark red nonpulsatile blood and flushed easily with sterile saline. The catheter was secured to the skin with 3 nylon sutures. Sterile dressing was placed.  COMPLICATIONS: None  CONDITION: Stable  Festus Barren 04/21/2019 4:06 PM  This note was created with Dragon Medical transcription system. Any errors in dictation are purely unintentional.

## 2019-04-21 NOTE — Care Management Important Message (Signed)
Important Message  Patient Details  Name: Sarah Sullivan MRN: [id number] Date of Birth: [birth year]   Medicare Important Message Given:  Yes    Olegario Messier A Teriana Danker 04/21/2019, 10:58 AM

## 2019-04-21 NOTE — Progress Notes (Addendum)
PT Cancellation Note  Patient Details Name: Sarah Sullivan MRN: [id number] DOB: [birth year]   Cancelled Treatment:    Reason Eval/Treat Not Completed: Medical issues which prohibited therapy Pt  with temporary fem cath, apparently having it removed later today and getting port access placed?  Will hold PT at this point until medically appropriate.    Malachi Pro, DPT 04/21/2019, 12:07 PM

## 2019-04-21 NOTE — Progress Notes (Signed)
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   Subjective: No diarrhea , no rectal bleeding    Objective: Vital signs in last 24 hours: Vitals:   04/20/19 1300 04/20/19 1431 04/20/19 2128 04/21/19 0516  BP: (!) 143/63 (!) 172/74 (!) 184/75 (!) 177/73  Pulse: (!) 52 (!) 49 (!) 51 (!) 57  Resp: 14 20 15 18   Temp:  97.6 F (36.4 C) 97.6 F (36.4 C) (!) 97.5 F (36.4 C)  TempSrc:  Oral Oral Oral  SpO2: 95% 98% 99% 97%  Weight:      Height:       Weight change:   Intake/Output Summary (Last 24 hours) at 04/21/2019 [id number] Last data filed at 04/20/2019 2200 Gross per 24 hour  Intake 197.89 ml  Output 150 ml  Net 47.89 ml     Exam: Heart:: Regular rate and rhythm, S1S2 present or without murmur or extra heart sounds Lungs: normal, clear to auscultation and clear to auscultation and percussion Abdomen: soft, nontender, normal bowel sounds   Lab Results: @LABTEST2 @ Micro Results: Recent Results (from the past 240 hour(s))  SARS Coronavirus 2 (CEPHEID - Performed in [facility] hospital lab), Hosp Order     Status: None   Collection Time: 04/14/19 10:32 AM  Result Value Ref Range Status   SARS Coronavirus 2 NEGATIVE NEGATIVE Final    Comment: (NOTE) If result is NEGATIVE SARS-CoV-2 target nucleic acids are NOT DETECTED. The SARS-CoV-2 RNA is generally detectable in upper and lower  respiratory specimens during the acute phase of infection. The lowest  concentration of SARS-CoV-2 viral copies this assay can detect is 250  copies / mL. A negative result does not preclude SARS-CoV-2 infection  and should not be used as the sole basis for treatment or other  patient management decisions.  A negative result may occur with  improper specimen collection / handling, submission of specimen other  than  nasopharyngeal swab, presence of viral mutation(s) within the  areas targeted by this assay, and inadequate number of viral copies  (<250 copies / mL). A negative result must be combined with clinical  observations, patient history, and epidemiological information. If result is POSITIVE SARS-CoV-2 target nucleic acids are DETECTED. The SARS-CoV-2 RNA is generally detectable in upper and lower  respiratory specimens dur ing the acute phase of infection.  Positive  results are indicative of active infection with SARS-CoV-2.  Clinical  correlation with patient history and other diagnostic information is  necessary to determine patient infection status.  Positive results do  not rule out bacterial infection or co-infection with other viruses. If result is PRESUMPTIVE POSTIVE SARS-CoV-2 nucleic acids MAY BE PRESENT.   A presumptive positive result was obtained on the submitted specimen  and confirmed on repeat testing.  While 2019 novel coronavirus  (SARS-CoV-2) nucleic acids may be present in the submitted sample  additional confirmatory testing may be necessary for epidemiological  and / or clinical management purposes  to differentiate between  SARS-CoV-2 and other Sarbecovirus currently known to infect humans.  If clinically indicated additional testing with an alternate test  methodology [id number]) is advised. The SARS-CoV-2 RNA is generally  detectable in upper and lower respiratory sp ecimens during the acute  phase of infection. The expected result is Negative. Fact Sheet for Patients:  BoilerBrush.com.cyhttps://www.fda.gov/media/136312/download Fact Sheet for Healthcare  Providers: https://pope.com/ This test is not yet approved or cleared by the [place] and has been authorized for detection and/or diagnosis of SARS-CoV-2 by FDA under an Emergency Use Authorization (EUA).  This EUA will remain in effect (meaning this test can be used) for the duration of the  COVID-19 declaration under Section 564(b)(1) of the Act, 21 U.S.C. section 360bbb-3(b)(1), unless the authorization is terminated or revoked sooner. Performed at [facility], [address] [place], [place] [address]   MRSA PCR Screening     Status: None   Collection Time: 04/14/19  7:22 PM  Result Value Ref Range Status   MRSA by PCR NEGATIVE NEGATIVE Final    Comment:        The GeneXpert MRSA Assay (FDA approved for NASAL specimens only), is one component of a comprehensive MRSA colonization surveillance program. It is not intended to diagnose MRSA infection nor to guide or monitor treatment for MRSA infections. Performed at [facility], [address] [place], [place] [address]   CULTURE, BLOOD (ROUTINE X 2) w Reflex to ID Panel     Status: None   Collection Time: 04/16/19  7:47 AM  Result Value Ref Range Status   Specimen Description BLOOD BLOOD RIGHT HAND  Final   Special Requests   Final    BOTTLES DRAWN AEROBIC AND ANAEROBIC Blood Culture adequate volume   Culture   Final    NO GROWTH 5 DAYS Performed at [facility], [address] [place], [place] [address]    Report Status 04/21/2019 FINAL  Final  CULTURE, BLOOD (ROUTINE X 2) w Reflex to ID Panel     Status: None   Collection Time: 04/16/19  9:27 AM  Result Value Ref Range Status   Specimen Description BLOOD BLOOD RIGHT HAND  Final   Special Requests   Final    BOTTLES DRAWN AEROBIC ONLY Blood Culture adequate volume   Culture   Final    NO GROWTH 5 DAYS Performed at [facility], [address] [place], [place] [address]    Report Status 04/21/2019 FINAL  Final  MRSA PCR Screening     Status: None   Collection Time: 04/17/19  1:47 PM  Result Value Ref Range Status   MRSA by PCR NEGATIVE NEGATIVE Final    Comment:        The GeneXpert MRSA Assay (FDA approved for NASAL specimens only), is one component of a comprehensive MRSA colonization surveillance  program. It is not intended to diagnose MRSA infection nor to guide or monitor treatment for MRSA infections. Performed at [facility], [address] [place], [place] [address]   Gastrointestinal Panel by PCR , Stool     Status: Abnormal   Collection Time: 04/17/19 10:42 PM  Result Value Ref Range Status   Campylobacter species NOT DETECTED NOT DETECTED Final   Plesimonas shigelloides NOT DETECTED NOT DETECTED Final   Salmonella species NOT DETECTED NOT DETECTED Final   Yersinia enterocolitica NOT DETECTED NOT DETECTED Final   Vibrio species NOT DETECTED NOT DETECTED Final   Vibrio cholerae NOT DETECTED NOT DETECTED Final   Enteroaggregative E coli (EAEC) NOT DETECTED NOT DETECTED Final   Enteropathogenic E coli (EPEC) DETECTED (A) NOT DETECTED Final    Comment: RESULT CALLED TO, READ BACK BY AND VERIFIED WITH: BETH BUONO AT 0211 04/19/2019 SDR    Enterotoxigenic E coli (ETEC) NOT DETECTED NOT DETECTED Final   Shiga like toxin producing E coli (  STEC) NOT DETECTED NOT DETECTED Final   Shigella/Enteroinvasive E coli (EIEC) NOT DETECTED NOT DETECTED Final   Cryptosporidium NOT DETECTED NOT DETECTED Final   Cyclospora cayetanensis NOT DETECTED NOT DETECTED Final   Entamoeba histolytica NOT DETECTED NOT DETECTED Final   Giardia lamblia NOT DETECTED NOT DETECTED Final   Adenovirus F40/41 NOT DETECTED NOT DETECTED Final   Astrovirus NOT DETECTED NOT DETECTED Final   Norovirus GI/GII NOT DETECTED NOT DETECTED Final   Rotavirus A NOT DETECTED NOT DETECTED Final   Sapovirus (I, II, IV, and V) NOT DETECTED NOT DETECTED Final    Comment: Performed at [facility], [address] [place], [place] [address]  C difficile quick scan w PCR reflex     Status: None   Collection Time: 04/18/19 10:42 PM  Result Value Ref Range Status   C Diff antigen NEGATIVE NEGATIVE Final   C Diff toxin NEGATIVE NEGATIVE Final   C Diff interpretation No C. difficile detected.  Final     Comment: Performed at [facility], [address] [place], [place] [address]   Studies/Results: US Liver Doppler  Result Date: 04/19/2019 CLINICAL DATA:  73 year old female with liver disease EXAM: DUPLEX ULTRASOUND OF LIVER TECHNIQUE: Color and duplex Doppler ultrasound was performed to evaluate the hepatic in-flow and out-flow vessels. COMPARISON:  None. FINDINGS: Portal Vein Velocities Main:  45 cm/sec Right:  30 cm/sec Left:  23 cm/sec Hepatic Vein Velocities Right:  33 cm/sec Middle:  28 cm/sec Left:  70 cm/sec Hepatic Artery Velocity:  90 cm/sec Splenic Vein Velocity:  13 cm/sec Varices: Absent Ascites: Ascites present, scant volume. Spleen volume 68 cubic cm. Left-sided pleural effusion. Mildly nodular contour of liver parenchyma with increased echogenicity, and no high-resolution images provided. IMPRESSION: Unremarkable directed duplex of the hepatic vasculature. Mildly nodular contour of the liver suggesting developing cirrhosis, though no high-resolution images were performed. Scant ascites. Electronically Signed   By: Gilmer Mor D.O.   On: 04/19/2019 13:57   Medications: I have reviewed the patient's current medications. Scheduled Meds: . aspirin  81 mg Oral Daily  . Chlorhexidine Gluconate Cloth  6 each Topical Daily  . ciprofloxacin  500 mg Oral Daily  . hydrocortisone sod succinate (SOLU-CORTEF) inj  100 mg Intravenous Q12H  . midodrine  10 mg Oral TID WC  . pantoprazole (PROTONIX) IV  40 mg Intravenous Q12H  . sodium chloride flush  10-40 mL Intracatheter Q12H  . sodium chloride flush  3 mL Intravenous Once  . venlafaxine XR  37.5 mg Oral Q breakfast   Continuous Infusions: . sodium chloride Stopped (04/20/19 1419)  . albumin human 12.5 g (04/21/19 0406)  . amiodarone 30 mg/hr (04/18/19 0811)  . sodium chloride     PRN Meds:.sodium chloride, meclizine, ondansetron **OR** ondansetron (ZOFRAN) IV, senna-docusate, sodium chloride flush   Assessment: Active  Problems:   Hypokalemia   Colitis   Dehydration   Thrombocytopenia (HCC)   Acute renal failure (HCC)  Sarah [id number] y.o.femaleadmitted with nausea,vomiting and diarrrhea leading to AKI and presumed shock liver. Rt sided colitis seen on imaging . Also found to have thrombocytopenia. Renal failure worsening. GI PCR is positive for enteropathogenic E coli and likely has dysentery due to the same, on Ciprofloxacin day 3   Plan: 1.F/u acute hepatitis panel,acute EBV,CMV,HSV,VZV  2 Once she recovers from this can consider endoscopy as an outpatient. 3. Complete course of ciprofloxacin.  4.concerns for Cirrhosis on imaging- will need outpatient work up and evaluation- EGD to  screen for varices. LFT's continue to rise- if no decline by tomorrow may need to consider MRCP. (tbilirubin is normal making obstruction less likely), transaminases are trending down but alkaline phosphatase is rising-this may be a lag. Check INR tomorrow, was 0.9 yesterday. 5. Amiodarone can be causing abnormal LFT's - ? Consider stopping/replacing  if possible.      LOS: 5 days   Sarah Mood, MD 04/21/2019, 9:36 AM

## 2019-04-21 NOTE — Progress Notes (Addendum)
Hematology/Oncology Progress Note [facility] [phone] [phone]  Patient Care Team: Titus MouldWhite, Elizabeth Burney, NP as PCP - General (Family Medicine)   Name of the patient: Sarah EmeraldLamai Butkus  [id number]  1946/09/25  Date of visit: 04/21/19   INTERVAL HISTORY-  Patient was seen and evaluated bedside. She reports feeling slightly better today. Tired and weak.  Some nausea. mild abdominal pain.    Review of systems- Review of Systems  Constitutional: Positive for appetite change and fatigue.  Respiratory: Negative for shortness of breath.   Cardiovascular: Negative for chest pain.  Gastrointestinal: Positive for abdominal pain.  Genitourinary: Negative for difficulty urinating.   Skin: Negative for itching.  Neurological: Negative for dizziness.  Psychiatric/Behavioral: Negative for confusion.    No Known Allergies  Patient Active Problem List   Diagnosis Date Noted   Dehydration    Thrombocytopenia (HCC)    Acute renal failure (HCC)    Colitis 04/16/2019   Hypokalemia 04/14/2019     Past Medical History:  Diagnosis Date   Hypertension      Past Surgical History:  Procedure Laterality Date   ABDOMINAL HYSTERECTOMY      Social History   Socioeconomic History   Marital status: Married    Spouse name: Not on file   Number of children: Not on file   Years of education: Not on file   Highest education level: Not on file  Occupational History   Not on file  Social Needs   Financial resource strain: Not on file   Food insecurity:    Worry: Not on file    Inability: Not on file   Transportation needs:    Medical: Not on file    Non-medical: Not on file  Tobacco Use   Smoking status: Never Smoker   Smokeless tobacco: Never Used  Substance and Sexual Activity   Alcohol use: Not Currently   Drug use: Not Currently   Sexual activity: Not on file  Lifestyle   Physical activity:    Days per  week: Not on file    Minutes per session: Not on file   Stress: Not on file  Relationships   Social connections:    Talks on phone: Not on file    Gets together: Not on file    Attends religious service: Not on file    Active member of club or organization: Not on file    Attends meetings of clubs or organizations: Not on file    Relationship status: Not on file   Intimate partner violence:    Fear of current or ex partner: Not on file    Emotionally abused: Not on file    Physically abused: Not on file    Forced sexual activity: Not on file  Other Topics Concern   Not on file  Social History Narrative   Not on file     History reviewed. No pertinent family history.   Current Facility-Administered Medications:    0.9 %  sodium chloride infusion, , Intravenous, PRN, Vida RiggerAleskerov, Fuad, MD, Stopped at 04/20/19 1419   albumin human 25 % solution 12.5 g, 12.5 g, Intravenous, Q6H, Kolluru, Sarath, MD, Stopped at 04/21/19 1129   aspirin chewable tablet 81 mg, 81 mg, Oral, Daily, Pyreddy, Pavan, MD, 81 mg at 04/21/19 1005   Chlorhexidine Gluconate Cloth 2 % PADS 6 each, 6 each, Topical, Daily, Harlon DittyKeene, Jeremiah D, NP, 6 each at 04/21/19 1005   ciprofloxacin (CIPRO) tablet 500 mg, 500 mg, Oral,  Daily, Foye Deer, RPH, 500 mg at 04/21/19 1005   hydrocortisone sodium succinate (SOLU-CORTEF) 100 MG injection 25 mg, 25 mg, Intravenous, Q12H, Wieting, Richard, MD   meclizine (ANTIVERT) tablet 12.5 mg, 12.5 mg, Oral, TID PRN, Salena Saner, MD   midazolam (VERSED) 2 MG/ML syrup, , , ,    midodrine (PROAMATINE) tablet 10 mg, 10 mg, Oral, TID WC, Aleskerov, Fuad, MD, 10 mg at 04/21/19 1236   ondansetron (ZOFRAN) tablet 4 mg, 4 mg, Oral, Q6H PRN **OR** ondansetron (ZOFRAN) injection 4 mg, 4 mg, Intravenous, Q6H PRN, Pyreddy, Pavan, MD, 4 mg at 04/19/19 1629   pantoprazole (PROTONIX) injection 40 mg, 40 mg, Intravenous, Q12H, Vanga, Loel Dubonnet, MD, 40 mg at 04/21/19 1005    senna-docusate (Senokot-S) tablet 1 tablet, 1 tablet, Oral, QHS PRN, Pyreddy, Pavan, MD   sodium chloride 0.9 % bolus 1,000 mL, 1,000 mL, Intravenous, Once, Mayo, Allyn Kenner, MD   sodium chloride flush (NS) 0.9 % injection 10-40 mL, 10-40 mL, Intracatheter, Q12H, Harlon Ditty D, NP, 10 mL at 04/21/19 1006   sodium chloride flush (NS) 0.9 % injection 10-40 mL, 10-40 mL, Intracatheter, PRN, Harlon Ditty D, NP   sodium chloride flush (NS) 0.9 % injection 3 mL, 3 mL, Intravenous, Once, Jene Every, MD   venlafaxine XR (EFFEXOR-XR) 24 hr capsule 37.5 mg, 37.5 mg, Oral, Q breakfast, Pyreddy, Pavan, MD, 37.5 mg at 04/21/19 1005   Physical exam:  Vitals:   04/20/19 2128 04/21/19 0516 04/21/19 1532 04/21/19 1621  BP: (!) 184/75 (!) 177/73 (!) 151/80 (!) 176/72  Pulse: (!) 51 (!) 57 (!) 59 (!) 50  Resp: Temp: 97.6 F (36.4 C) (!) 97.5 F (36.4 C) (!) 97.5 F (36.4 C)   TempSrc: Oral Oral Oral   SpO2: 99% 97%  92%  Weight:   126 lb 8.7 oz (57.4 kg)   Height:    (1.499 m)    Physical Exam  Constitutional: No distress.  HENT:  Head: Normocephalic and atraumatic.  Eyes: Pupils are equal, round, and reactive to light.  Cardiovascular:  Bradycardia  Pulmonary/Chest: Effort normal.  Musculoskeletal:     Comments: Anasarca 3+ edema in all 4 extremities.  Neurological: She is alert.  Skin: Skin is dry.  Psychiatric: Affect normal.       CMP Latest Ref Rng & Units 04/21/2019  Glucose 70 - 99 mg/dL 161(W)  BUN 8 - 23 mg/dL 96(E)  Creatinine 4.54 - 1.00 mg/dL 0.98(J)  Sodium 191 - 478 mmol/L 140  Potassium 3.5 - 5.1 mmol/L 3.7  Chloride 98 - 111 mmol/L 102  CO2 22 - 32 mmol/L 25  Calcium 8.9 - 10.3 mg/dL 7.2(L)  Total Protein 6.5 - 8.1 g/dL 5.1(L)  Total Bilirubin 0.3 - 1.2 mg/dL 1.0  Alkaline Phos 38 - 126 U/L 663(H)  AST 15 - 41 U/L 113(H)  ALT 0 - 44 U/L 105(H)   CBC Latest Ref Rng & Units 04/21/2019  WBC 4.0 - 10.5 K/uL 15.4(H)  Hemoglobin 12.0 -  15.0 g/dL 2.9(F)  Hematocrit 62.1 - 46.0 % 27.3(L)  Platelets 150 - 400 K/uL 152   RADIOGRAPHIC STUDIES: I have personally reviewed the radiological images as listed and agreed with the findings in the report.  Ct Abdomen Pelvis Wo Contrast  Result Date: 04/15/2019 CLINICAL DATA:  Nausea and vomiting, history of hypertension, dizziness. EXAM: CT ABDOMEN AND PELVIS WITHOUT CONTRAST TECHNIQUE: Multidetector CT imaging of the abdomen and pelvis was performed following  the standard protocol without IV contrast. COMPARISON:  None. FINDINGS: Lower chest: No acute abnormality. Hepatobiliary: No focal liver abnormality is seen. No gallstones, gallbladder wall thickening, or biliary dilatation. Pancreas: Unremarkable. No pancreatic ductal dilatation or surrounding inflammatory changes. Spleen: Normal in size without focal abnormality. Adrenals/Urinary Tract: Adrenal glands appear normal. Kidneys are unremarkable without mass, stone or hydronephrosis. No perinephric fluid. No ureteral or bladder calculi identified. Bladder appears normal, partially decompressed. Stomach/Bowel: No dilated large or small bowel loops. Questionable thickening of the walls of the RIGHT colon and transverse colon with mild pericolonic inflammation suggesting acute colitis. Appendix is normal. Stomach is unremarkable, partially decompressed. Vascular/Lymphatic: Aortic atherosclerosis. No enlarged lymph nodes seen. Reproductive: Presumed hysterectomy. No adnexal mass or free fluid seen. Other: No free fluid or abscess collection. No free intraperitoneal air. Musculoskeletal: No acute or suspicious osseous finding. Fixation hardware at the L3 through L5 levels appears intact and appropriately position. IMPRESSION: 1. Suspected mild colitis of the transverse colon and RIGHT colon. 2. Remainder of the abdomen and pelvis CT is unremarkable. No bowel obstruction. No free fluid or abscess collection. No evidence of acute solid organ abnormality.  Appendix is normal. Aortic Atherosclerosis (ICD10-I70.0). Electronically Signed   By: Bary Richard M.D.   On: 04/15/2019 11:53   Dg Abd 1 View  Result Date: 04/18/2019 CLINICAL DATA:  Right femoral line placement EXAM: ABDOMEN - 1 VIEW COMPARISON:  Dominant CT 3 days ago FINDINGS: Right femoral line. The tip is obscured by lumbar fusion hardware but is likely at the level of the iliac confluence. Mild gaseous distension of bowel, question mild ileus. Probable anasarca seen at the right flank. IMPRESSION: 1. Femoral line with tip obscured by lumbar fusion hardware. The tip is either at the iliac confluence or IVC. 2. Mild generalized gaseous distension of bowel, there may be developing ileus. Electronically Signed   By: Marnee Spring M.D.   On: 04/18/2019 04:27   Dg Abd 1 View  Result Date: 04/14/2019 CLINICAL DATA:  Nausea and vomiting for 3 days. EXAM: ABDOMEN - 1 VIEW COMPARISON:  Chest radiograph same date. FINDINGS: The bowel gas pattern is normal. There is mildly prominent stool in the distal colon. There is no supine evidence of free intraperitoneal air or suspicious abdominal calcification. There are postsurgical changes in the lumbar spine status post L3 through 5 fusion. No acute osseous findings. IMPRESSION: No acute abdominal findings. Mildly increased distal colonic stool burden, suggesting constipation. Electronically Signed   By: Carey Bullocks M.D.   On: 04/14/2019 14:32   US Renal  Result Date: 04/17/2019 CLINICAL DATA:  Acute renal failure EXAM: RENAL / URINARY TRACT ULTRASOUND COMPLETE COMPARISON:  CT from 04/15/2019 FINDINGS: Right Kidney: Renal measurements: 9.8 x 4.5 x 4.7 cm. = volume: 107 mL . Echogenicity within normal limits. No mass or hydronephrosis visualized. Left Kidney: Renal measurements: 9.7 x 5.1 x 5.6 cm. = volume: 145 mL. Echogenicity within normal limits. No mass or hydronephrosis visualized. Bladder: Appears normal for degree of bladder distention. Increased  echogenicity of the liver is noted consistent with fatty infiltration. Minimal free fluid is noted adjacent to the liver. IMPRESSION: Kidneys are within normal limits. Fatty infiltration of the liver. Minimal free fluid in the abdomen. Electronically Signed   By: Alcide Clever M.D.   On: 04/17/2019 21:08   Dg Chest Port 1 View  Result Date: 04/14/2019 CLINICAL DATA:  Weakness.  Nausea and vomiting for 3 days. EXAM: PORTABLE CHEST 1 VIEW COMPARISON:  None. FINDINGS:  The cardiomediastinal silhouette is within normal limits. The lungs are mildly hypoinflated with mild bronchovascular crowding in the lung bases. No airspace consolidation, edema, pleural effusion, pneumothorax is identified. No acute osseous abnormality is seen. IMPRESSION: No active disease. Electronically Signed   By: Sebastian Ache M.D.   On: 04/14/2019 11:15   US Liver Doppler  Result Date: 04/19/2019 CLINICAL DATA:  73 year old female with liver disease EXAM: DUPLEX ULTRASOUND OF LIVER TECHNIQUE: Color and duplex Doppler ultrasound was performed to evaluate the hepatic in-flow and out-flow vessels. COMPARISON:  None. FINDINGS: Portal Vein Velocities Main:  45 cm/sec Right:  30 cm/sec Left:  23 cm/sec Hepatic Vein Velocities Right:  33 cm/sec Middle:  28 cm/sec Left:  70 cm/sec Hepatic Artery Velocity:  90 cm/sec Splenic Vein Velocity:  13 cm/sec Varices: Absent Ascites: Ascites present, scant volume. Spleen volume 68 cubic cm. Left-sided pleural effusion. Mildly nodular contour of liver parenchyma with increased echogenicity, and no high-resolution images provided. IMPRESSION: Unremarkable directed duplex of the hepatic vasculature. Mildly nodular contour of the liver suggesting developing cirrhosis, though no high-resolution images were performed. Scant ascites. Electronically Signed   By: Gilmer Mor D.O.   On: 04/19/2019 13:57    Assessment and plan-  Patient is a 73 y.o. female who is currently admitted due to hypovolemic shock  secondary to severe dehydration/sepsis/colitis.  Developed thrombocytopenia during hospitalization.   #Thrombocytopenia, Thrombocytopenia normalized. Slightly increased LDH, haptoglobin still pending.  Plasma free hemoglobin still pending. Most likely previous thrombocytopenia secondary to consumption due to sepsis/colitis/GI bleed. Less likely MAHA  #Worsening leukocytosis.  Smear yesterday shows left shift.  Compatible with action.  Continue to monitor #Worsening of kidney function creatinine continues to increase to 5.  Nephrology on board.  Starting dialysis. #Transaminitis without hyperbilirubinemia.  Stable. #Infectious colitis,  continue current therapy #Anemia, hemoglobin stable.  Continue to monitor. Thank you for allowing me to participate in the care of this patient.   Rickard Patience, MD, PhD Hematology Oncology [facility] Pager- [phone] 04/21/2019

## 2019-04-21 NOTE — Progress Notes (Signed)
Pharmacy Electrolyte Monitoring Consult:  Pharmacy consulted to assist in monitoring and replacing electrolytes in this 73 y.o. female admitted on 04/14/2019 with Nausea and Emesis   Labs:  Sodium (mmol/L)  Date Value  04/21/2019 140  12/25/2014 141   Potassium (mmol/L)  Date Value  04/21/2019 3.7  12/25/2014 3.5   Magnesium (mg/dL)  Date Value  68/09/5725 2.0   Phosphorus (mg/dL)  Date Value  20/35/5974 3.0   Calcium (mg/dL)  Date Value  16/38/4536 7.2 (L)   Calcium, Total (mg/dL)  Date Value  46/80/3212 9.2   Albumin (g/dL)  Date Value  24/82/5003 2.5 (L)    Assessment/Plan: Patient received total of 140 mEq of IV and oral potassium replacement on 5/22 on top of receiving NS/40K as MIVF.  Patient in acute renal failure, serum creatinine continues upward trend.   5/27:  K 3.7  Scr 5.04  Scr increased.   Will defer any supplementation at this time. Will need to be cautious with supplementation due to decreasing renal fucntion.   Electrolytes with am labs.   Pharmacy will continue to monitor and adjust per consult.   Bettey Costa, PharmD Clinical Pharmacist 04/21/2019 7:21 AM

## 2019-04-21 NOTE — Progress Notes (Signed)
Patient ID: Sarah Sullivan, female   DOB: 01/20/1946, 73 y.o.   MRN: [id number]  [facility] PROGRESS NOTE  Sarah Sullivan WUJ:[id number] DOB: [birth year] DOA: 04/14/2019 PCP: Titus Mould, NP  HPI/Subjective: Patient feeling very weak and tired.  Nausea but no vomiting.  Abdominal pain is less.  Feels very swollen.  Objective: Vitals:   04/20/19 2128 04/21/19 0516  BP: (!) 184/75 (!) 177/73  Pulse: (!) 51 (!) 57  Resp: 15 18  Temp: 97.6 F (36.4 C) (!) 97.5 F (36.4 C)  SpO2: 99% 97%    Filed Weights   04/16/19 0010 04/17/19 0301 04/17/19 1246  Weight: 51 kg 55.1 kg 57.4 kg    ROS: Review of Systems  Constitutional: Negative for chills and fever.  Eyes: Negative for blurred vision.  Respiratory: Negative for cough and shortness of breath.   Cardiovascular: Negative for chest pain.  Gastrointestinal: Positive for abdominal pain and nausea. Negative for constipation, diarrhea and vomiting.  Genitourinary: Negative for dysuria.  Musculoskeletal: Negative for joint pain.  Neurological: Negative for dizziness and headaches.   Exam: Physical Exam  Constitutional: She is oriented to person, place, and time.  HENT:  Nose: No mucosal edema.  Mouth/Throat: No oropharyngeal exudate or posterior oropharyngeal edema.  Eyes: Pupils are equal, round, and reactive to light. Conjunctivae, EOM and lids are normal.  Neck: No JVD present. Carotid bruit is not present. No edema present. No thyroid mass and no thyromegaly present.  Cardiovascular: S1 normal and S2 normal. Exam reveals no gallop.  No murmur heard. Pulses:      Dorsalis pedis pulses are 2+ on the right side and 2+ on the left side.  Respiratory: No respiratory distress. She has decreased breath sounds in the right lower field and the left lower field. She has no wheezes. She has no rhonchi. She has no rales.  GI: Soft. Bowel sounds are normal. There is generalized abdominal tenderness.  Musculoskeletal:      Right ankle: She exhibits no swelling.     Left ankle: She exhibits no swelling.  Lymphadenopathy:    She has no cervical adenopathy.  Neurological: She is alert and oriented to person, place, and time. No cranial nerve deficit.  Skin: Skin is warm. No rash noted. Nails show no clubbing.  Psychiatric: She has a normal mood and affect.      Data Reviewed: Basic Metabolic Panel: Recent Labs  Lab 04/14/19 1649  04/15/19 2215 04/16/19 0351 04/17/19 9562 04/18/19 0032 04/19/19 0556 04/20/19 0450 04/21/19 0352  NA  --    < > 137 139 136 140 137 140 140  K 2.6*   < > 3.8 3.7 3.4* 3.4* 3.7 3.3* 3.7  CL  --    < > 111 112* 113* 116* 106 101 102  CO2  --    < > 18* 17* 16* 15* 21* 25 25  GLUCOSE  --    < > 100* 78 118* 98 149* 156* 109*  BUN  --    < > 32* 31* 58* 68* 63* 66* 65*  CREATININE  --    < > 2.28* 2.11* 2.91* 3.82* 4.32* 4.61* 5.04*  CALCIUM  --    < > 7.3* 7.3* 6.7* 6.6* 6.1* 6.4* 7.2*  MG  --   --  1.9 2.0  --   --   --   --   --   PHOS 3.0  --   --   --   --   --   --   --   --    < > =  values in this interval not displayed.   Liver Function Tests: Recent Labs  Lab 04/17/19 0436 04/18/19 0032 04/19/19 0556 04/20/19 0450 04/21/19 0352  AST 482* 485* 204* 133* 113*  ALT 281* 286* 172* 123* 105*  ALKPHOS 181* 321* 423* 573* 663*  BILITOT 1.2 1.7* 1.1 1.0 1.0  PROT 4.2* 4.4* 3.9* 4.6* 5.1*  ALBUMIN 1.8* 1.8* 1.6* 2.1* 2.5*   No results for input(s): LIPASE, AMYLASE in the last 168 hours. CBC: Recent Labs  Lab 04/16/19 0351 04/17/19 0436  04/18/19 0032 04/18/19 0646  04/19/19 0556 04/19/19 1423 04/19/19 1954 04/20/19 0450 04/20/19 1045 04/21/19 0352  WBC 7.7 5.5  --  5.4 7.3  --  9.2  --   --  12.4*  --  15.4*  NEUTROABS 6.3 4.2  --   --   --   --  7.1  --   --   --   --  9.9*  HGB 11.6* 7.6*   < > 6.6* 8.1*  8.1*   < > 9.6* 9.7* 9.8* 9.3* 10.0* 9.7*  HCT 36.1 24.9*   < > 20.4* 24.3*  24.4*   < > 28.4* 27.3* 28.0* 26.4* 28.8* 27.3*  MCV 96.3 101.6*   --  95.3 93.1  --  89.9  --   --  86.8  --  85.8  PLT 106* 90*   < > 81* 85*  --  95*  --   --  126*  --  152   < > = values in this interval not displayed.   Cardiac Enzymes: Recent Labs  Lab 04/15/19 2215 04/16/19 0351 04/16/19 0946  CKTOTAL  --   --  272*  TROPONINI <0.03 <0.03 0.03*     Recent Results (from the past 240 hour(s))  SARS Coronavirus 2 (CEPHEID - Performed in [facility] hospital lab), Hosp Order     Status: None   Collection Time: 04/14/19 10:32 AM  Result Value Ref Range Status   SARS Coronavirus 2 NEGATIVE NEGATIVE Final    Comment: (NOTE) If result is NEGATIVE SARS-CoV-2 target nucleic acids are NOT DETECTED. The SARS-CoV-2 RNA is generally detectable in upper and lower  respiratory specimens during the acute phase of infection. The lowest  concentration of SARS-CoV-2 viral copies this assay can detect is 250  copies / mL. A negative result does not preclude SARS-CoV-2 infection  and should not be used as the sole basis for treatment or other  patient management decisions.  A negative result may occur with  improper specimen collection / handling, submission of specimen other  than nasopharyngeal swab, presence of viral mutation(s) within the  areas targeted by this assay, and inadequate number of viral copies  (<250 copies / mL). A negative result must be combined with clinical  observations, patient history, and epidemiological information. If result is POSITIVE SARS-CoV-2 target nucleic acids are DETECTED. The SARS-CoV-2 RNA is generally detectable in upper and lower  respiratory specimens dur ing the acute phase of infection.  Positive  results are indicative of active infection with SARS-CoV-2.  Clinical  correlation with patient history and other diagnostic information is  necessary to determine patient infection status.  Positive results do  not rule out bacterial infection or co-infection with other viruses. If result is PRESUMPTIVE  POSTIVE SARS-CoV-2 nucleic acids MAY BE PRESENT.   A presumptive positive result was obtained on the submitted specimen  and confirmed on repeat testing.  While 2019 novel coronavirus  (SARS-CoV-2) nucleic acids may be  present in the submitted sample  additional confirmatory testing may be necessary for epidemiological  and / or clinical management purposes  to differentiate between  SARS-CoV-2 and other Sarbecovirus currently known to infect humans.  If clinically indicated additional testing with an alternate test  methodology [phone]([id number]) is advised. The SARS-CoV-2 RNA is generally  detectable in upper and lower respiratory sp ecimens during the acute  phase of infection. The expected result is Negative. Fact Sheet for Patients:  BoilerBrush.com.cyhttps://www.fda.gov/media/136312/download Fact Sheet for Healthcare Providers: https://pope.com/https://www.fda.gov/media/136313/download This test is not yet approved or cleared by the [place] FDA and has been authorized for detection and/or diagnosis of SARS-CoV-2 by FDA under an Emergency Use Authorization (EUA).  This EUA will remain in effect (meaning this test can be used) for the duration of the COVID-19 declaration under Section 564(b)(1) of the Act, 21 U.S.C. section 360bbb-3(b)(1), unless the authorization is terminated or revoked sooner. Performed at [facility], [address] [place], [place] [address]   MRSA PCR Screening     Status: None   Collection Time: 04/14/19  7:22 PM  Result Value Ref Range Status   MRSA by PCR NEGATIVE NEGATIVE Final    Comment:        The GeneXpert MRSA Assay (FDA approved for NASAL specimens only), is one component of a comprehensive MRSA colonization surveillance program. It is not intended to diagnose MRSA infection nor to guide or monitor treatment for MRSA infections. Performed at [facility], [address] [place], [place] [address]   CULTURE, BLOOD (ROUTINE X 2) w Reflex to ID Panel      Status: None   Collection Time: 04/16/19  7:47 AM  Result Value Ref Range Status   Specimen Description BLOOD BLOOD RIGHT HAND  Final   Special Requests   Final    BOTTLES DRAWN AEROBIC AND ANAEROBIC Blood Culture adequate volume   Culture   Final    NO GROWTH 5 DAYS Performed at [facility], [address] [place], [place] [address]    Report Status 04/21/2019 FINAL  Final  CULTURE, BLOOD (ROUTINE X 2) w Reflex to ID Panel     Status: None   Collection Time: 04/16/19  9:27 AM  Result Value Ref Range Status   Specimen Description BLOOD BLOOD RIGHT HAND  Final   Special Requests   Final    BOTTLES DRAWN AEROBIC ONLY Blood Culture adequate volume   Culture   Final    NO GROWTH 5 DAYS Performed at [facility], [address] [place], [place] [address]    Report Status 04/21/2019 FINAL  Final  MRSA PCR Screening     Status: None   Collection Time: 04/17/19  1:47 PM  Result Value Ref Range Status   MRSA by PCR NEGATIVE NEGATIVE Final    Comment:        The GeneXpert MRSA Assay (FDA approved for NASAL specimens only), is one component of a comprehensive MRSA colonization surveillance program. It is not intended to diagnose MRSA infection nor to guide or monitor treatment for MRSA infections. Performed at [facility], [address] [place], [place] [address]   Gastrointestinal Panel by PCR , Stool     Status: Abnormal   Collection Time: 04/17/19 10:42 PM  Result Value Ref Range Status   Campylobacter species NOT DETECTED NOT DETECTED Final   Plesimonas shigelloides NOT DETECTED NOT DETECTED Final   Salmonella species NOT DETECTED NOT DETECTED Final   Yersinia  enterocolitica NOT DETECTED NOT DETECTED Final   Vibrio species NOT DETECTED NOT DETECTED Final   Vibrio cholerae NOT DETECTED NOT DETECTED Final   Enteroaggregative E coli (EAEC) NOT DETECTED NOT DETECTED Final   Enteropathogenic E coli (EPEC) DETECTED (A) NOT DETECTED Final     Comment: RESULT CALLED TO, READ BACK BY AND VERIFIED WITH: BETH BUONO AT 0211 04/19/2019 SDR    Enterotoxigenic E coli (ETEC) NOT DETECTED NOT DETECTED Final   Shiga like toxin producing E coli (STEC) NOT DETECTED NOT DETECTED Final   Shigella/Enteroinvasive E coli (EIEC) NOT DETECTED NOT DETECTED Final   Cryptosporidium NOT DETECTED NOT DETECTED Final   Cyclospora cayetanensis NOT DETECTED NOT DETECTED Final   Entamoeba histolytica NOT DETECTED NOT DETECTED Final   Giardia lamblia NOT DETECTED NOT DETECTED Final   Adenovirus F40/41 NOT DETECTED NOT DETECTED Final   Astrovirus NOT DETECTED NOT DETECTED Final   Norovirus GI/GII NOT DETECTED NOT DETECTED Final   Rotavirus A NOT DETECTED NOT DETECTED Final   Sapovirus (I, II, IV, and V) NOT DETECTED NOT DETECTED Final    Comment: Performed at [facility], [address] [place], [place] [address]  C difficile quick scan w PCR reflex     Status: None   Collection Time: 04/18/19 10:42 PM  Result Value Ref Range Status   C Diff antigen NEGATIVE NEGATIVE Final   C Diff toxin NEGATIVE NEGATIVE Final   C Diff interpretation No C. difficile detected.  Final    Comment: Performed at [facility], [address] [place], [place] [address]      Scheduled Meds: . aspirin  81 mg Oral Daily  . Chlorhexidine Gluconate Cloth  6 each Topical Daily  . ciprofloxacin  500 mg Oral Daily  . hydrocortisone sod succinate (SOLU-CORTEF) inj  25 mg Intravenous Q12H  . midodrine  10 mg Oral TID WC  . pantoprazole (PROTONIX) IV  40 mg Intravenous Q12H  . sodium chloride flush  10-40 mL Intracatheter Q12H  . sodium chloride flush  3 mL Intravenous Once  . venlafaxine XR  37.5 mg Oral Q breakfast   Continuous Infusions: . sodium chloride Stopped (04/20/19 1419)  . albumin human Stopped (04/21/19 1129)  . sodium chloride      Assessment/Plan:  1. Hypovolemic shock.  Holding antihypertensive medications.  Taper Solu-Cortef to 25 mg  twice a day. 2. 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  Continue Cipro 3. Acute kidney injury.  Likely secondary to hypovolemic shock and GI losses.  Case discussed with nephrology and temporary dialysis catheter to be placed.  Hopefully can get out central line in the groin.  If kidney function worsens tomorrow patient will end up starting dialysis. 4. Acute blood loss anemia.  Patient responded to 1 unit of blood.  Hemoglobin stable over the last few days. 5. Atrial fibrillation with rapid ventricular response converted over to normal sinus rhythm with amiodarone.  Now off amiodarone.  Patient is been in sinus bradycardia at this point. 6. Thrombocytopenia continue to monitor blood counts.  Smear does show some red blood cell fragments.  Platelet count starting to recover.  At this point less likely HUS.  Likely recovering from severe sepsis infection. 7. Elevated liver function test.  Likely with hypotension.  Continue to monitor.  Hepatitis panel negative.  Ultrasound concerning for early cirrhosis even though CAT scan did not comment on this.  Code Status:     Code Status Orders  (From admission,  onward)         Start     Ordered   04/14/19 1533  Full code  Continuous     04/14/19 1532        Code Status History    This patient has a current code status but no historical code status.     Family Communication: Spoke with husband on the phone Disposition Plan: To be determined  Consultants:  Critical care specialist  Gastroenterology  Nephrology  Oncology  Time spent: 28 minutes.  Spoke with nephrology  Merril Nagy [employer]

## 2019-04-22 DIAGNOSIS — D500 Iron deficiency anemia secondary to blood loss (chronic): Secondary | ICD-10-CM

## 2019-04-22 LAB — COMPREHENSIVE METABOLIC PANEL
ALT: 108 U/L — ABNORMAL HIGH (ref 0–44)
AST: 146 U/L — ABNORMAL HIGH (ref 15–41)
Albumin: 3.1 g/dL — ABNORMAL LOW (ref 3.5–5.0)
Alkaline Phosphatase: 672 U/L — ABNORMAL HIGH (ref 38–126)
Anion gap: 12 (ref 5–15)
BUN: 72 mg/dL — ABNORMAL HIGH (ref 8–23)
CO2: 28 mmol/L (ref 22–32)
Calcium: 7.4 mg/dL — ABNORMAL LOW (ref 8.9–10.3)
Chloride: 100 mmol/L (ref 98–111)
Creatinine, Ser: 5.39 mg/dL — ABNORMAL HIGH (ref 0.44–1.00)
GFR calc Af Amer: 8 mL/min — ABNORMAL LOW (ref >60)
GFR calc non Af Amer: 7 mL/min — ABNORMAL LOW (ref >60)
Glucose, Bld: 133 mg/dL — ABNORMAL HIGH (ref 70–99)
Potassium: 3.1 mmol/L — ABNORMAL LOW (ref 3.5–5.1)
Sodium: 140 mmol/L (ref 135–145)
Total Bilirubin: 0.9 mg/dL (ref 0.3–1.2)
Total Protein: 5.5 g/dL — ABNORMAL LOW (ref 6.5–8.1)

## 2019-04-22 LAB — CBC
HCT: 25.9 % — ABNORMAL LOW (ref 36.0–46.0)
Hemoglobin: 8.8 g/dL — ABNORMAL LOW (ref 12.0–15.0)
MCH: 30.6 pg (ref 26.0–34.0)
MCHC: 34 g/dL (ref 30.0–36.0)
MCV: 89.9 fL (ref 80.0–100.0)
Platelets: 151 10*3/uL (ref 150–400)
RBC: 2.88 MIL/uL — ABNORMAL LOW (ref 3.87–5.11)
RDW: 14.6 % (ref 11.5–15.5)
WBC: 14.3 10*3/uL — ABNORMAL HIGH (ref 4.0–10.5)
nRBC: 0 % (ref 0.0–0.2)

## 2019-04-22 LAB — HAPTOGLOBIN: Haptoglobin: 252 mg/dL (ref 42–346)

## 2019-04-22 MED ORDER — POTASSIUM CHLORIDE 20 MEQ PO PACK
20.0000 meq | PACK | Freq: Once | ORAL | Status: AC
  Administered 2019-04-22: 09:00:00 20 meq via ORAL
  Filled 2019-04-22: qty 1

## 2019-04-22 MED ORDER — TUBERCULIN PPD 5 UNIT/0.1ML ID SOLN
5.0000 [IU] | Freq: Once | INTRADERMAL | Status: AC
  Administered 2019-04-22: 5 [IU] via INTRADERMAL
  Filled 2019-04-22 (×2): qty 0.1

## 2019-04-22 MED ORDER — HYDRALAZINE HCL 20 MG/ML IJ SOLN
10.0000 mg | Freq: Four times a day (QID) | INTRAMUSCULAR | Status: DC | PRN
  Administered 2019-04-22 – 2019-04-28 (×9): 10 mg via INTRAVENOUS
  Filled 2019-04-22 (×9): qty 1

## 2019-04-22 MED ORDER — SODIUM CHLORIDE 0.9% FLUSH: 3.0000 mL | INTRAVENOUS | Status: DC | PRN

## 2019-04-22 NOTE — Progress Notes (Signed)
[facility]  ROUNDING NOTE   Subjective:   Furosemide 40mg  IV x 1.   Creatinine 5.39 (5.04) (4.61) (4.32) (3.82)  Temp HD catheter placed yesterday by Dr. Wyn Quakerew  Completed three days of IV albumin.   Objective:  Vital signs in last 24 hours:  Temp:  [97.5 F (36.4 C)-98 F (36.7 C)] 97.5 F (36.4 C) (05/30 0840) Pulse Rate:  [50-69] 69 (05/30 0846) Resp:  [18-20] 20 (05/30 0846) BP: (151-178)/(64-90) 155/84 (05/30 0846) SpO2:  [92 %-100 %] 96 % (05/30 0846) Weight:  [57.4 kg] 57.4 kg (05/29 1532)  Weight change:  Filed Weights   04/17/19 0301 04/17/19 1246 04/21/19 1532  Weight: 55.1 kg 57.4 kg 57.4 kg    Intake/Output: I/O last 3 completed shifts: In: 60 [I.V.:10; IV Piggyback:50] Out: -    Intake/Output this shift:  No intake/output data recorded.  Physical Exam: General: NAD,   Head: Normocephalic, atraumatic. Moist oral mucosal membranes  Eyes: Anicteric, PERRL  Neck: Supple, trachea midline  Lungs:  Bilateral crackles  Heart: irregular  Abdomen:  Mild distension, nontender  Extremities: ++ peripheral edema.  Neurologic: Nonfocal, moving all four extremities  Skin: No lesions   Access: Left femoral temp HD catheter 5/29 Dr. Wyn Quakerew    Basic Metabolic Panel: Recent Labs  Lab 04/15/19 2215 04/16/19 [id number]  04/18/19 0032 04/19/19 [id number] 04/20/19 0450 04/21/19 0352 04/22/19 0245  NA 137 139   < > 140 137 140 140 140  K 3.8 3.7   < > 3.4* 3.7 3.3* 3.7 3.1*  CL 111 112*   < > 116* 106 101 102 100  CO2 18* 17*   < > 15* 21* 25 25 28   GLUCOSE 100* 78   < > 98 149* 156* 109* 133*  BUN 32* 31*   < > 68* 63* 66* 65* 72*  CREATININE 2.28* 2.11*   < > 3.82* 4.32* 4.61* 5.04* 5.39*  CALCIUM 7.3* 7.3*   < > 6.6* 6.1* 6.4* 7.2* 7.4*  MG 1.9 2.0  --   --   --   --   --   --    < > = values in this interval not displayed.    Liver Function Tests: Recent Labs  Lab 04/18/19 0032 04/19/19 0556 04/20/19 0450 04/21/19 0352 04/22/19 0245  AST 485*  204* 133* 113* 146*  ALT 286* 172* 123* 105* 108*  ALKPHOS 321* 423* 573* 663* 672*  BILITOT 1.7* 1.1 1.0 1.0 0.9  PROT 4.4* 3.9* 4.6* 5.1* 5.5*  ALBUMIN 1.8* 1.6* 2.1* 2.5* 3.1*   No results for input(s): LIPASE, AMYLASE in the last 168 hours. No results for input(s): AMMONIA in the last 168 hours.  CBC: Recent Labs  Lab 04/16/19 0351 04/17/19 0436  04/18/19 0646  04/19/19 0556  04/19/19 1954 04/20/19 0450 04/20/19 1045 04/21/19 0352 04/22/19 0245  WBC 7.7 5.5   < > 7.3  --  9.2  --   --  12.4*  --  15.4* 14.3*  NEUTROABS 6.3 4.2  --   --   --  7.1  --   --   --   --  9.9*  --   HGB 11.6* 7.6*   < > 8.1*  8.1*   < > 9.6*   < > 9.8* 9.3* 10.0* 9.7* 8.8*  HCT 36.1 24.9*   < > 24.3*  24.4*   < > 28.4*   < > 28.0* 26.4* 28.8* 27.3* 25.9*  MCV 96.3 101.6*   < >  93.1  --  89.9  --   --  86.8  --  85.8 89.9  PLT 106* 90*   < > 85*  --  95*  --   --  126*  --  152 151   < > = values in this interval not displayed.    Cardiac Enzymes: Recent Labs  Lab 04/15/19 2215 04/16/19 0351 04/16/19 0946  CKTOTAL  --   --  272*  TROPONINI <0.03 <0.03 0.03*    BNP: Invalid input(s): POCBNP  CBG: No results for input(s): GLUCAP in the last 168 hours.  Microbiology: Results for orders placed or performed during the hospital encounter of 04/14/19  SARS Coronavirus 2 (CEPHEID - Performed in [facility] hospital lab), Hosp Order     Status: None   Collection Time: 04/14/19 10:32 AM  Result Value Ref Range Status   SARS Coronavirus 2 NEGATIVE NEGATIVE Final    Comment: (NOTE) If result is NEGATIVE SARS-CoV-2 target nucleic acids are NOT DETECTED. The SARS-CoV-2 RNA is generally detectable in upper and lower  respiratory specimens during the acute phase of infection. The lowest  concentration of SARS-CoV-2 viral copies this assay can detect is 250  copies / mL. A negative result does not preclude SARS-CoV-2 infection  and should not be used as the sole basis for treatment or other   patient management decisions.  A negative result may occur with  improper specimen collection / handling, submission of specimen other  than nasopharyngeal swab, presence of viral mutation(s) within the  areas targeted by this assay, and inadequate number of viral copies  (<250 copies / mL). A negative result must be combined with clinical  observations, patient history, and epidemiological information. If result is POSITIVE SARS-CoV-2 target nucleic acids are DETECTED. The SARS-CoV-2 RNA is generally detectable in upper and lower  respiratory specimens dur ing the acute phase of infection.  Positive  results are indicative of active infection with SARS-CoV-2.  Clinical  correlation with patient history and other diagnostic information is  necessary to determine patient infection status.  Positive results do  not rule out bacterial infection or co-infection with other viruses. If result is PRESUMPTIVE POSTIVE SARS-CoV-2 nucleic acids MAY BE PRESENT.   A presumptive positive result was obtained on the submitted specimen  and confirmed on repeat testing.  While 2019 novel coronavirus  (SARS-CoV-2) nucleic acids may be present in the submitted sample  additional confirmatory testing may be necessary for epidemiological  and / or clinical management purposes  to differentiate between  SARS-CoV-2 and other Sarbecovirus currently known to infect humans.  If clinically indicated additional testing with an alternate test  methodology [phone]) is advised. The SARS-CoV-2 RNA is generally  detectable in upper and lower respiratory sp ecimens during the acute  phase of infection. The expected result is Negative. Fact Sheet for Patients:  BoilerBrush.com.cy Fact Sheet for Healthcare Providers: https://pope.com/ This test is not yet approved or cleared by the [place] FDA and has been authorized for detection and/or diagnosis of SARS-CoV-2  by FDA under an Emergency Use Authorization (EUA).  This EUA will remain in effect (meaning this test can be used) for the duration of the COVID-19 declaration under Section 564(b)(1) of the Act, 21 U.S.C. section 360bbb-3(b)(1), unless the authorization is terminated or revoked sooner. Performed at [facility], [address], [place], [place] [address]   MRSA PCR Screening     Status: None   Collection Time: 04/14/19  7:22 PM  Result Value Ref Range Status   MRSA by PCR NEGATIVE NEGATIVE Final    Comment:        The GeneXpert MRSA Assay (FDA approved for NASAL specimens only), is one component of a comprehensive MRSA colonization surveillance program. It is not intended to diagnose MRSA infection nor to guide or monitor treatment for MRSA infections. Performed at [facility], [address] [place], [place] [address]   CULTURE, BLOOD (ROUTINE X 2) w Reflex to ID Panel     Status: None   Collection Time: 04/16/19  7:47 AM  Result Value Ref Range Status   Specimen Description BLOOD BLOOD RIGHT HAND  Final   Special Requests   Final    BOTTLES DRAWN AEROBIC AND ANAEROBIC Blood Culture adequate volume   Culture   Final    NO GROWTH 5 DAYS Performed at [facility], [address] [place], [place] [address]    Report Status 04/21/2019 FINAL  Final  CULTURE, BLOOD (ROUTINE X 2) w Reflex to ID Panel     Status: None   Collection Time: 04/16/19  9:27 AM  Result Value Ref Range Status   Specimen Description BLOOD BLOOD RIGHT HAND  Final   Special Requests   Final    BOTTLES DRAWN AEROBIC ONLY Blood Culture adequate volume   Culture   Final    NO GROWTH 5 DAYS Performed at [facility], [address] [place], [place] [address]    Report Status 04/21/2019 FINAL  Final  MRSA PCR Screening     Status: None   Collection Time: 04/17/19  1:47 PM  Result Value Ref Range Status   MRSA by PCR NEGATIVE NEGATIVE Final    Comment:         The GeneXpert MRSA Assay (FDA approved for NASAL specimens only), is one component of a comprehensive MRSA colonization surveillance program. It is not intended to diagnose MRSA infection nor to guide or monitor treatment for MRSA infections. Performed at [facility], [address] [place], [place] [address]   Gastrointestinal Panel by PCR , Stool     Status: Abnormal   Collection Time: 04/17/19 10:42 PM  Result Value Ref Range Status   Campylobacter species NOT DETECTED NOT DETECTED Final   Plesimonas shigelloides NOT DETECTED NOT DETECTED Final   Salmonella species NOT DETECTED NOT DETECTED Final   Yersinia enterocolitica NOT DETECTED NOT DETECTED Final   Vibrio species NOT DETECTED NOT DETECTED Final   Vibrio cholerae NOT DETECTED NOT DETECTED Final   Enteroaggregative E coli (EAEC) NOT DETECTED NOT DETECTED Final   Enteropathogenic E coli (EPEC) DETECTED (A) NOT DETECTED Final    Comment: RESULT CALLED TO, READ BACK BY AND VERIFIED WITH: BETH BUONO AT 0211 04/19/2019 SDR    Enterotoxigenic E coli (ETEC) NOT DETECTED NOT DETECTED Final   Shiga like toxin producing E coli (STEC) NOT DETECTED NOT DETECTED Final   Shigella/Enteroinvasive E coli (EIEC) NOT DETECTED NOT DETECTED Final   Cryptosporidium NOT DETECTED NOT DETECTED Final   Cyclospora cayetanensis NOT DETECTED NOT DETECTED Final   Entamoeba histolytica NOT DETECTED NOT DETECTED Final   Giardia lamblia NOT DETECTED NOT DETECTED Final   Adenovirus F40/41 NOT DETECTED NOT DETECTED Final   Astrovirus NOT DETECTED NOT DETECTED Final   Norovirus GI/GII NOT DETECTED NOT DETECTED Final   Rotavirus A NOT DETECTED NOT DETECTED Final   Sapovirus (I, II, IV, and V) NOT DETECTED NOT DETECTED Final    Comment:  Performed at [facility], [address] [place], [place] [address]  C difficile quick scan w PCR reflex     Status: None   Collection Time: 04/18/19 10:42 PM  Result Value Ref Range Status   C  Diff antigen NEGATIVE NEGATIVE Final   C Diff toxin NEGATIVE NEGATIVE Final   C Diff interpretation No C. difficile detected.  Final    Comment: Performed at [facility], [address] [place], [place] [address]    Coagulation Studies: Recent Labs    04/20/19 1741  LABPROT 11.9  INR 0.9    Urinalysis: No results for input(s): COLORURINE, LABSPEC, PHURINE, GLUCOSEU, HGBUR, BILIRUBINUR, KETONESUR, PROTEINUR, UROBILINOGEN, NITRITE, LEUKOCYTESUR in the last 72 hours.  Invalid input(s): APPERANCEUR    Imaging: No results found.   Medications:   . sodium chloride Stopped (04/20/19 1419)  . albumin human Stopped (04/22/19 1022)  . sodium chloride     . aspirin  81 mg Oral Daily  . Chlorhexidine Gluconate Cloth  6 each Topical Daily  . ciprofloxacin  500 mg Oral Daily  . hydrocortisone sod succinate (SOLU-CORTEF) inj  25 mg Intravenous Q12H  . midodrine  10 mg Oral TID WC  . pantoprazole (PROTONIX) IV  40 mg Intravenous Q12H  . sodium chloride flush  10-40 mL Intracatheter Q12H  . venlafaxine XR  37.5 mg Oral Q breakfast   sodium chloride, meclizine, ondansetron **OR** ondansetron (ZOFRAN) IV, senna-docusate, sodium chloride flush, Tdap  Assessment/ Plan:  Ms. Stavroula Rohde is a 73 y.o. Asian female with a PMHx of hypertension, degenerative disc disease, lumbar radiculitis, history of depression, osteopenia, who was admitted to [facility] on 04/14/2019 for hypovolumic shock with E. Coli colitis.   1.  Acute renal failure with metabolic acidosis: secondary to prerenal azotemia, GI losses and blood losses. ATN. Nonoliguric urine output.  Hematuria and proteinuria on admission.  Creatinine continues to rise. Temp HD catheter placed yesterday Dialysis treatment for today. First treatment. Orders prepared.   2. Hypertension with atrial fibrillation. Status post one dose of furosemide yesterday  3. Anemia with renal failure: status post 1 unit PRBC 5/26. With  thrombocytopenia Appreciate Hematology input   LOS: 6 Kingson Lohmeyer 5/30/202012:12 PM

## 2019-04-22 NOTE — Progress Notes (Signed)
Hematology/Oncology Progress Note [facility] [phone] [phone]  Patient Care Team: Titus Mould, NP as PCP - General (Family Medicine)   Name of the patient: Sarah Sullivan  [id number]  1946-11-02  Date of visit: 04/22/19  SUBJECTIVE:  Status post dialysis yesterday.  Patient reports feeling slightly better today.  Still tired and fatigued. No nausea.  She is eating breakfast.  Still some abdominal discomfort but no pain. Reported bowel movement yesterday which was black.  Review of systems- Review of Systems  Constitutional: Positive for appetite change and fatigue.  Respiratory: Negative for shortness of breath.   Cardiovascular: Negative for chest pain.  Gastrointestinal: Negative for abdominal pain.       Abdominal discomfort  Genitourinary: Negative for difficulty urinating.   Skin: Negative for itching.  Neurological: Negative for dizziness.  Psychiatric/Behavioral: Negative for confusion. The patient is not nervous/anxious.     No Known Allergies  Patient Active Problem List   Diagnosis Date Noted   Abnormal LFTs    Dehydration    Thrombocytopenia (HCC)    Acute renal failure (HCC)    Colitis 04/16/2019   Hypokalemia 04/14/2019     Past Medical History:  Diagnosis Date   Hypertension      Past Surgical History:  Procedure Laterality Date   ABDOMINAL HYSTERECTOMY      Social History   Socioeconomic History   Marital status: Married    Spouse name: Not on file   Number of children: Not on file   Years of education: Not on file   Highest education level: Not on file  Occupational History   Not on file  Social Needs   Financial resource strain: Not on file   Food insecurity:    Worry: Not on file    Inability: Not on file   Transportation needs:    Medical: Not on file    Non-medical: Not on file  Tobacco Use   Smoking status: Never Smoker   Smokeless tobacco: Never  Used  Substance and Sexual Activity   Alcohol use: Not Currently   Drug use: Not Currently   Sexual activity: Not on file  Lifestyle   Physical activity:    Days per week: Not on file    Minutes per session: Not on file   Stress: Not on file  Relationships   Social connections:    Talks on phone: Not on file    Gets together: Not on file    Attends religious service: Not on file    Active member of club or organization: Not on file    Attends meetings of clubs or organizations: Not on file    Relationship status: Not on file   Intimate partner violence:    Fear of current or ex partner: Not on file    Emotionally abused: Not on file    Physically abused: Not on file    Forced sexual activity: Not on file  Other Topics Concern   Not on file  Social History Narrative   Not on file     History reviewed. No pertinent family history.   Current Facility-Administered Medications:    0.9 %  sodium chloride infusion, , Intravenous, PRN, Vida Rigger, MD, Stopped at 04/20/19 1419   aspirin chewable tablet 81 mg, 81 mg, Oral, Daily, Pyreddy, Pavan, MD, 81 mg at 04/22/19 4401   Chlorhexidine Gluconate Cloth 2 % PADS 6 each, 6 each, Topical, Daily, Judithe Modest, NP, 6 each at  04/22/19 1334   ciprofloxacin (CIPRO) tablet 500 mg, 500 mg, Oral, Daily, Foye DeerKluttz, Lisa G, RPH, 500 mg at 04/22/19 [id number]   hydrocortisone sodium succinate (SOLU-CORTEF) 100 MG injection 25 mg, 25 mg, Intravenous, Q12H, Alford HighlandWieting, Richard, MD, 25 mg at 04/22/19 [id number]   meclizine (ANTIVERT) tablet 12.5 mg, 12.5 mg, Oral, TID PRN, Salena SanerGonzalez, Carmen L, MD   midodrine (PROAMATINE) tablet 10 mg, 10 mg, Oral, TID WC, Karna ChristmasAleskerov, Fuad, MD, 10 mg at 04/22/19 1212   ondansetron (ZOFRAN) tablet 4 mg, 4 mg, Oral, Q6H PRN **OR** ondansetron (ZOFRAN) injection 4 mg, 4 mg, Intravenous, Q6H PRN, Pyreddy, Pavan, MD, 4 mg at 04/19/19 1629   pantoprazole (PROTONIX) injection 40 mg, 40 mg, Intravenous, Q12H, Vanga,  Loel Dubonnetohini Reddy, MD, 40 mg at 04/22/19 0854   senna-docusate (Senokot-S) tablet 1 tablet, 1 tablet, Oral, QHS PRN, Pyreddy, Pavan, MD   sodium chloride 0.9 % bolus 1,000 mL, 1,000 mL, Intravenous, Once, Mayo, Allyn KennerKaty Dodd, MD   sodium chloride flush (NS) 0.9 % injection 10-40 mL, 10-40 mL, Intracatheter, Q12H, Harlon DittyKeene, Jeremiah D, NP, 10 mL at 04/22/19 1020   sodium chloride flush (NS) 0.9 % injection 10-40 mL, 10-40 mL, Intracatheter, PRN, Judithe ModestKeene, Jeremiah D, NP   Tdap (BOOSTRIX) injection 0.5 mL, 0.5 mL, Intramuscular, Prior to discharge, Alford HighlandWieting, Richard, MD   tuberculin injection 5 Units, 5 Units, Intradermal, Once, Kolluru, Sarath, MD   venlafaxine XR (EFFEXOR-XR) 24 hr capsule 37.5 mg, 37.5 mg, Oral, Q breakfast, Pyreddy, Pavan, MD, 37.5 mg at 04/22/19 0853   Physical exam:  Vitals:   04/22/19 0536 04/22/19 0840 04/22/19 0844 04/22/19 0846  BP: (!) 177/69 (!) 175/90 (!) 178/64 (!) 155/84  Pulse: (!) 55 (!) 57 62 69  Resp: 18 20 20 20   Temp: 97.6 F (36.4 C) (!) 97.5 F (36.4 C)    TempSrc: Oral Oral    SpO2: 99% 97% 100% 96%  Weight:      Height:       Physical Exam  Constitutional: No distress.  HENT:  Head: Normocephalic and atraumatic.  Eyes: Pupils are equal, round, and reactive to light.  Cardiovascular: Normal rate.  Pulmonary/Chest: Effort normal. No respiratory distress.  Musculoskeletal:     Comments: Anasarca 3+ edema in all 4 extremities.  Neurological: She is alert.  Skin: Skin is dry.  Psychiatric: Affect normal.       CMP Latest Ref Rng & Units 04/22/2019  Glucose 70 - 99 mg/dL 130(Q133(H)  BUN 8 - 23 mg/dL 65(H72(H)  Creatinine 8.460.44 - 1.00 mg/dL 9.62(X5.39(H)  Sodium 528135 - 413145 mmol/L 140  Potassium 3.5 - 5.1 mmol/L 3.1(L)  Chloride 98 - 111 mmol/L 100  CO2 22 - 32 mmol/L 28  Calcium 8.9 - 10.3 mg/dL 7.4(L)  Total Protein 6.5 - 8.1 g/dL 2.4(M5.5(L)  Total Bilirubin 0.3 - 1.2 mg/dL 0.9  Alkaline Phos 38 - 126 U/L 672(H)  AST 15 - 41 U/L 146(H)  ALT 0 - 44 U/L 108(H)     CBC Latest Ref Rng & Units 04/22/2019  WBC 4.0 - 10.5 K/uL 14.3(H)  Hemoglobin 12.0 - 15.0 g/dL 0.1(U8.8(L)  Hematocrit 27.236.0 - 46.0 % 25.9(L)  Platelets 150 - 400 K/uL 151   RADIOGRAPHIC STUDIES: I have personally reviewed the radiological images as listed and agreed with the findings in the report.  Ct Abdomen Pelvis Wo Contrast  Result Date: 04/15/2019 CLINICAL DATA:  Nausea and vomiting, history of hypertension, dizziness. EXAM: CT ABDOMEN AND PELVIS WITHOUT CONTRAST TECHNIQUE: Multidetector CT imaging of  the abdomen and pelvis was performed following the standard protocol without IV contrast. COMPARISON:  None. FINDINGS: Lower chest: No acute abnormality. Hepatobiliary: No focal liver abnormality is seen. No gallstones, gallbladder wall thickening, or biliary dilatation. Pancreas: Unremarkable. No pancreatic ductal dilatation or surrounding inflammatory changes. Spleen: Normal in size without focal abnormality. Adrenals/Urinary Tract: Adrenal glands appear normal. Kidneys are unremarkable without mass, stone or hydronephrosis. No perinephric fluid. No ureteral or bladder calculi identified. Bladder appears normal, partially decompressed. Stomach/Bowel: No dilated large or small bowel loops. Questionable thickening of the walls of the RIGHT colon and transverse colon with mild pericolonic inflammation suggesting acute colitis. Appendix is normal. Stomach is unremarkable, partially decompressed. Vascular/Lymphatic: Aortic atherosclerosis. No enlarged lymph nodes seen. Reproductive: Presumed hysterectomy. No adnexal mass or free fluid seen. Other: No free fluid or abscess collection. No free intraperitoneal air. Musculoskeletal: No acute or suspicious osseous finding. Fixation hardware at the L3 through L5 levels appears intact and appropriately position. IMPRESSION: 1. Suspected mild colitis of the transverse colon and RIGHT colon. 2. Remainder of the abdomen and pelvis CT is unremarkable. No bowel  obstruction. No free fluid or abscess collection. No evidence of acute solid organ abnormality. Appendix is normal. Aortic Atherosclerosis (ICD10-I70.0). Electronically Signed   By: Bary Richard M.D.   On: 04/15/2019 11:53   Dg Abd 1 View  Result Date: 04/18/2019 CLINICAL DATA:  Right femoral line placement EXAM: ABDOMEN - 1 VIEW COMPARISON:  Dominant CT 3 days ago FINDINGS: Right femoral line. The tip is obscured by lumbar fusion hardware but is likely at the level of the iliac confluence. Mild gaseous distension of bowel, question mild ileus. Probable anasarca seen at the right flank. IMPRESSION: 1. Femoral line with tip obscured by lumbar fusion hardware. The tip is either at the iliac confluence or IVC. 2. Mild generalized gaseous distension of bowel, there may be developing ileus. Electronically Signed   By: Marnee Spring M.D.   On: 04/18/2019 04:27   Dg Abd 1 View  Result Date: 04/14/2019 CLINICAL DATA:  Nausea and vomiting for 3 days. EXAM: ABDOMEN - 1 VIEW COMPARISON:  Chest radiograph same date. FINDINGS: The bowel gas pattern is normal. There is mildly prominent stool in the distal colon. There is no supine evidence of free intraperitoneal air or suspicious abdominal calcification. There are postsurgical changes in the lumbar spine status post L3 through 5 fusion. No acute osseous findings. IMPRESSION: No acute abdominal findings. Mildly increased distal colonic stool burden, suggesting constipation. Electronically Signed   By: Carey Bullocks M.D.   On: 04/14/2019 14:32   US Renal  Result Date: 04/17/2019 CLINICAL DATA:  Acute renal failure EXAM: RENAL / URINARY TRACT ULTRASOUND COMPLETE COMPARISON:  CT from 04/15/2019 FINDINGS: Right Kidney: Renal measurements: 9.8 x 4.5 x 4.7 cm. = volume: 107 mL . Echogenicity within normal limits. No mass or hydronephrosis visualized. Left Kidney: Renal measurements: 9.7 x 5.1 x 5.6 cm. = volume: 145 mL. Echogenicity within normal limits. No mass or  hydronephrosis visualized. Bladder: Appears normal for degree of bladder distention. Increased echogenicity of the liver is noted consistent with fatty infiltration. Minimal free fluid is noted adjacent to the liver. IMPRESSION: Kidneys are within normal limits. Fatty infiltration of the liver. Minimal free fluid in the abdomen. Electronically Signed   By: Alcide Clever M.D.   On: 04/17/2019 21:08   Dg Chest Port 1 View  Result Date: 04/14/2019 CLINICAL DATA:  Weakness.  Nausea and vomiting for 3 days. EXAM: PORTABLE  CHEST 1 VIEW COMPARISON:  None. FINDINGS: The cardiomediastinal silhouette is within normal limits. The lungs are mildly hypoinflated with mild bronchovascular crowding in the lung bases. No airspace consolidation, edema, pleural effusion, pneumothorax is identified. No acute osseous abnormality is seen. IMPRESSION: No active disease. Electronically Signed   By: Sebastian Ache M.D.   On: 04/14/2019 11:15   US Liver Doppler  Result Date: 04/19/2019 CLINICAL DATA:  73 year old female with liver disease EXAM: DUPLEX ULTRASOUND OF LIVER TECHNIQUE: Color and duplex Doppler ultrasound was performed to evaluate the hepatic in-flow and out-flow vessels. COMPARISON:  None. FINDINGS: Portal Vein Velocities Main:  45 cm/sec Right:  30 cm/sec Left:  23 cm/sec Hepatic Vein Velocities Right:  33 cm/sec Middle:  28 cm/sec Left:  70 cm/sec Hepatic Artery Velocity:  90 cm/sec Splenic Vein Velocity:  13 cm/sec Varices: Absent Ascites: Ascites present, scant volume. Spleen volume 68 cubic cm. Left-sided pleural effusion. Mildly nodular contour of liver parenchyma with increased echogenicity, and no high-resolution images provided. IMPRESSION: Unremarkable directed duplex of the hepatic vasculature. Mildly nodular contour of the liver suggesting developing cirrhosis, though no high-resolution images were performed. Scant ascites. Electronically Signed   By: Gilmer Mor D.O.   On: 04/19/2019 13:57    Assessment  and plan-  Patient is a 73 y.o. female who is currently admitted due to hypovolemic shock secondary to severe dehydration/sepsis/colitis.  Developed thrombocytopenia during hospitalization.   #Thrombocytopenia, resolved.  Platelet count stable.  # leukocytosis.  Trending down. #Acute renal failure, nephrology on board.  Starting dialysis. #Transaminitis without hyperbilirubinemia. Deliah Goody liver, stable. #Infectious colitis,  continue current therapy with Cipro.  Last bowel movement was yesterday. #Anemia, hemoglobin gradually sliding down.  No evidence of hemolysis.  Likely secondary to blood loss/infection/renal failure. Reticulocyte panel shows normal reticulocyte hemoglobin.  Indicating acceptable iron stores.  Continue to monitor.  Thank you for allowing me to participate in the care of this patient.   Rickard Patience, MD, PhD Hematology Oncology [facility] Pager- [phone] 04/22/2019

## 2019-04-22 NOTE — Progress Notes (Addendum)
Patient ID: Sarah Sullivan, female   DOB: [birth year], 73 y.o.   MRN: [id number]  [facility] PROGRESS NOTE  Tinna Kolker WUJ:[id number] DOB: 14-Apr-1946 DOA: 04/14/2019 PCP: Titus Mould, NP  HPI/Subjective: Patient feeling sore in her lower extremities patient had temporary hemodialysis catheter placement 04/21/2019 nausea but no vomiting.  Abdominal pain is less.    Objective: Vitals:   04/22/19 0844 04/22/19 0846  BP: (!) 178/64 (!) 155/84  Pulse: 62 69  Resp: 20 20  Temp:    SpO2: 100% 96%    Filed Weights   04/17/19 0301 04/17/19 1246 04/21/19 1532  Weight: 55.1 kg 57.4 kg 57.4 kg    ROS: Review of Systems  Constitutional: Negative for chills and fever.  Eyes: Negative for blurred vision.  Respiratory: Negative for cough and shortness of breath.   Cardiovascular: Negative for chest pain.  Gastrointestinal: Positive for nausea. Negative for abdominal pain, constipation, diarrhea and vomiting.  Genitourinary: Negative for dysuria.  Musculoskeletal: Negative for joint pain.  Neurological: Negative for dizziness and headaches.   Exam: Physical Exam  Constitutional: She is oriented to person, place, and time.  HENT:  Nose: No mucosal edema.  Mouth/Throat: No oropharyngeal exudate or posterior oropharyngeal edema.  Eyes: Pupils are equal, round, and reactive to light. Conjunctivae, EOM and lids are normal.  Neck: No JVD present. Carotid bruit is not present. No edema present. No thyroid mass and no thyromegaly present.  Cardiovascular: S1 normal and S2 normal. Exam reveals no gallop.  No murmur heard. Pulses:      Dorsalis pedis pulses are 2+ on the right side and 2+ on the left side.  Respiratory: No respiratory distress. She has decreased breath sounds in the right lower field and the left lower field. She has no wheezes. She has no rhonchi. She has no rales.  GI: Soft. Bowel sounds are normal. There is generalized abdominal tenderness.  Left groin with  temporary TLC catheter, no active bleeding  Musculoskeletal:     Right ankle: She exhibits no swelling.     Left ankle: She exhibits no swelling.  Lymphadenopathy:    She has no cervical adenopathy.  Neurological: She is alert and oriented to person, place, and time. No cranial nerve deficit.  Skin: Skin is warm. No rash noted. Nails show no clubbing.  Psychiatric: She has a normal mood and affect.      Data Reviewed: Basic Metabolic Panel: Recent Labs  Lab 04/15/19 2215 04/16/19 0351  04/18/19 0032 04/19/19 0556 04/20/19 0450 04/21/19 0352 04/22/19 0245  NA 137 139   < > 140 137 140 140 140  K 3.8 3.7   < > 3.4* 3.7 3.3* 3.7 3.1*  CL 111 112*   < > 116* 106 101 102 100  CO2 18* 17*   < > 15* 21* GLUCOSE 100* 78   < > 98 149* 156* 109* 133*  BUN 32* 31*   < > 68* 63* 66* 65* 72*  CREATININE 2.28* 2.11*   < > 3.82* 4.32* 4.61* 5.04* 5.39*  CALCIUM 7.3* 7.3*   < > 6.6* 6.1* 6.4* 7.2* 7.4*  MG 1.9 2.0  --   --   --   --   --   --    < > = values in this interval not displayed.   Liver Function Tests: Recent Labs  Lab 04/18/19 0032 04/19/19 0556 04/20/19 0450 04/21/19 0352 04/22/19 0245  AST 485* 204* 133* 113* 146*  ALT  286* 172* 123* 105* 108*  ALKPHOS 321* 423* 573* 663* 672*  BILITOT 1.7* 1.1 1.0 1.0 0.9  PROT 4.4* 3.9* 4.6* 5.1* 5.5*  ALBUMIN 1.8* 1.6* 2.1* 2.5* 3.1*   No results for input(s): LIPASE, AMYLASE in the last 168 hours. CBC: Recent Labs  Lab 04/16/19 0351 04/17/19 0436  04/18/19 0646  04/19/19 0556  04/19/19 1954 04/20/19 0450 04/20/19 1045 04/21/19 0352 04/22/19 0245  WBC 7.7 5.5   < > 7.3  --  9.2  --   --  12.4*  --  15.4* 14.3*  NEUTROABS 6.3 4.2  --   --   --  7.1  --   --   --   --  9.9*  --   HGB 11.6* 7.6*   < > 8.1*  8.1*   < > 9.6*   < > 9.8* 9.3* 10.0* 9.7* 8.8*  HCT 36.1 24.9*   < > 24.3*  24.4*   < > 28.4*   < > 28.0* 26.4* 28.8* 27.3* 25.9*  MCV 96.3 101.6*   < > 93.1  --  89.9  --   --  86.8  --  85.8 89.9   PLT 106* 90*   < > 85*  --  95*  --   --  126*  --  152 151   < > = values in this interval not displayed.   Cardiac Enzymes: Recent Labs  Lab 04/15/19 2215 04/16/19 0351 04/16/19 0946  CKTOTAL  --   --  272*  TROPONINI <0.03 <0.03 0.03*     Recent Results (from the past 240 hour(s))  SARS Coronavirus 2 (CEPHEID - Performed in [facility] hospital lab), Hosp Order     Status: None   Collection Time: 04/14/19 10:32 AM  Result Value Ref Range Status   SARS Coronavirus 2 NEGATIVE NEGATIVE Final    Comment: (NOTE) If result is NEGATIVE SARS-CoV-2 target nucleic acids are NOT DETECTED. The SARS-CoV-2 RNA is generally detectable in upper and lower  respiratory specimens during the acute phase of infection. The lowest  concentration of SARS-CoV-2 viral copies this assay can detect is 250  copies / mL. A negative result does not preclude SARS-CoV-2 infection  and should not be used as the sole basis for treatment or other  patient management decisions.  A negative result may occur with  improper specimen collection / handling, submission of specimen other  than nasopharyngeal swab, presence of viral mutation(s) within the  areas targeted by this assay, and inadequate number of viral copies  (<250 copies / mL). A negative result must be combined with clinical  observations, patient history, and epidemiological information. If result is POSITIVE SARS-CoV-2 target nucleic acids are DETECTED. The SARS-CoV-2 RNA is generally detectable in upper and lower  respiratory specimens dur ing the acute phase of infection.  Positive  results are indicative of active infection with SARS-CoV-2.  Clinical  correlation with patient history and other diagnostic information is  necessary to determine patient infection status.  Positive results do  not rule out bacterial infection or co-infection with other viruses. If result is PRESUMPTIVE POSTIVE SARS-CoV-2 nucleic acids MAY BE PRESENT.   A  presumptive positive result was obtained on the submitted specimen  and confirmed on repeat testing.  While 2019 novel coronavirus  (SARS-CoV-2) nucleic acids may be present in the submitted sample  additional confirmatory testing may be necessary for epidemiological  and / or clinical management purposes  to differentiate between  SARS-CoV-2  and other Sarbecovirus currently known to infect humans.  If clinically indicated additional testing with an alternate test  methodology [phone]([id number]) is advised. The SARS-CoV-2 RNA is generally  detectable in upper and lower respiratory sp ecimens during the acute  phase of infection. The expected result is Negative. Fact Sheet for Patients:  BoilerBrush.com.cyhttps://www.fda.gov/media/136312/download Fact Sheet for Healthcare Providers: https://pope.com/https://www.fda.gov/media/136313/download This test is not yet approved or cleared by the [place] FDA and has been authorized for detection and/or diagnosis of SARS-CoV-2 by FDA under an Emergency Use Authorization (EUA).  This EUA will remain in effect (meaning this test can be used) for the duration of the COVID-19 declaration under Section 564(b)(1) of the Act, 21 U.S.C. section 360bbb-3(b)(1), unless the authorization is terminated or revoked sooner. Performed at [facility], [address] [place], [place] [address]   MRSA PCR Screening     Status: None   Collection Time: 04/14/19  7:22 PM  Result Value Ref Range Status   MRSA by PCR NEGATIVE NEGATIVE Final    Comment:        The GeneXpert MRSA Assay (FDA approved for NASAL specimens only), is one component of a comprehensive MRSA colonization surveillance program. It is not intended to diagnose MRSA infection nor to guide or monitor treatment for MRSA infections. Performed at [facility], [address] [place], [place] [address]   CULTURE, BLOOD (ROUTINE X 2) w Reflex to ID Panel     Status: None   Collection Time: 04/16/19  7:47 AM   Result Value Ref Range Status   Specimen Description BLOOD BLOOD RIGHT HAND  Final   Special Requests   Final    BOTTLES DRAWN AEROBIC AND ANAEROBIC Blood Culture adequate volume   Culture   Final    NO GROWTH 5 DAYS Performed at [facility], [address] [place], [place] [address]    Report Status 04/21/2019 FINAL  Final  CULTURE, BLOOD (ROUTINE X 2) w Reflex to ID Panel     Status: None   Collection Time: 04/16/19  9:27 AM  Result Value Ref Range Status   Specimen Description BLOOD BLOOD RIGHT HAND  Final   Special Requests   Final    BOTTLES DRAWN AEROBIC ONLY Blood Culture adequate volume   Culture   Final    NO GROWTH 5 DAYS Performed at [facility], [address] [place], [place] [address]    Report Status 04/21/2019 FINAL  Final  MRSA PCR Screening     Status: None   Collection Time: 04/17/19  1:47 PM  Result Value Ref Range Status   MRSA by PCR NEGATIVE NEGATIVE Final    Comment:        The GeneXpert MRSA Assay (FDA approved for NASAL specimens only), is one component of a comprehensive MRSA colonization surveillance program. It is not intended to diagnose MRSA infection nor to guide or monitor treatment for MRSA infections. Performed at [facility], [address] [place], [place] [address]   Gastrointestinal Panel by PCR , Stool     Status: Abnormal   Collection Time: 04/17/19 10:42 PM  Result Value Ref Range Status   Campylobacter species NOT DETECTED NOT DETECTED Final   Plesimonas shigelloides NOT DETECTED NOT DETECTED Final   Salmonella species NOT DETECTED NOT DETECTED Final   Yersinia enterocolitica NOT DETECTED NOT DETECTED Final   Vibrio species NOT DETECTED NOT DETECTED Final   Vibrio cholerae NOT DETECTED NOT DETECTED Final   Enteroaggregative  E coli (EAEC) NOT DETECTED NOT DETECTED Final   Enteropathogenic E coli (EPEC) DETECTED (A) NOT DETECTED Final    Comment: RESULT CALLED TO, READ BACK BY AND VERIFIED  WITH: BETH BUONO AT 0211 04/19/2019 SDR    Enterotoxigenic E coli (ETEC) NOT DETECTED NOT DETECTED Final   Shiga like toxin producing E coli (STEC) NOT DETECTED NOT DETECTED Final   Shigella/Enteroinvasive E coli (EIEC) NOT DETECTED NOT DETECTED Final   Cryptosporidium NOT DETECTED NOT DETECTED Final   Cyclospora cayetanensis NOT DETECTED NOT DETECTED Final   Entamoeba histolytica NOT DETECTED NOT DETECTED Final   Giardia lamblia NOT DETECTED NOT DETECTED Final   Adenovirus F40/41 NOT DETECTED NOT DETECTED Final   Astrovirus NOT DETECTED NOT DETECTED Final   Norovirus GI/GII NOT DETECTED NOT DETECTED Final   Rotavirus A NOT DETECTED NOT DETECTED Final   Sapovirus (I, II, IV, and V) NOT DETECTED NOT DETECTED Final    Comment: Performed at [facility], [address] [place], [place] [address]  C difficile quick scan w PCR reflex     Status: None   Collection Time: 04/18/19 10:42 PM  Result Value Ref Range Status   C Diff antigen NEGATIVE NEGATIVE Final   C Diff toxin NEGATIVE NEGATIVE Final   C Diff interpretation No C. difficile detected.  Final    Comment: Performed at [facility], [address] [place], [place] [address]      Scheduled Meds: . aspirin  81 mg Oral Daily  . Chlorhexidine Gluconate Cloth  6 each Topical Daily  . ciprofloxacin  500 mg Oral Daily  . hydrocortisone sod succinate (SOLU-CORTEF) inj  25 mg Intravenous Q12H  . midodrine  10 mg Oral TID WC  . pantoprazole (PROTONIX) IV  40 mg Intravenous Q12H  . sodium chloride flush  10-40 mL Intracatheter Q12H  . venlafaxine XR  37.5 mg Oral Q breakfast   Continuous Infusions: . sodium chloride Stopped (04/20/19 1419)  . sodium chloride      Assessment/Plan:  1. Acute kidney injury.  Likely secondary to hypovolemic shock and GI losses.  Case discussed with nephrology and temporary dialysis catheter placed on 04/21/2019 groin.  If kidney function worsens tomorrow patient will end up starting  dialysis. 2. Hypo-kalemia replete and recheck in a.m. check magnesium also 3. hypovolemic shock.  Resolved holding antihypertensive medications.  Taper Solu-Cortef to 25 mg twice a day. 4. 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  Continue Cipro 5. Acute blood loss anemia.  Patient responded to 1 unit of blood.  Hemoglobin stable over the last few days.  Hemoglobin at 8.8 6. Atrial fibrillation with rapid ventricular response converted over to normal sinus rhythm with amiodarone.  Now off amiodarone.  Patient is been in sinus bradycardia at this point. 7. Thrombocytopenia continue to monitor blood counts.-Secondary to severe sepsis resolved smear does show some red blood cell fragments.  Platelet count back to normal  8. Elevated liver function test.  Likely with hypotension.  Continue to monitor.  Hepatitis panel negative.  Ultrasound concerning for early cirrhosis even though CAT scan did not comment on this.  Code Status:     Code Status Orders  (From admission, onward)         Start     Ordered   04/14/19 1533  Full code  Continuous     04/14/19 1532        Code Status History    This patient has a current  code status but no historical code status.     Family Communication: Spoke with husband on the phone Disposition Plan: To be determined  Consultants:  Critical care specialist  Gastroenterology  Nephrology  Oncology  Time spent: 33 minutes.  Spoke with nephrology  [facility]  [facility]

## 2019-04-22 NOTE — Progress Notes (Signed)
Pt leaving unit to go to HD unit.

## 2019-04-22 NOTE — Progress Notes (Addendum)
Reports generalized "soreness'; moves slowly; has generalized edema. Up to BR with SB+. Telemetry indicates brief drops to mid 40's- average rate in 50's-60 range asymptomatic. Elevated BP currently with Dr. Amado Coe notified with new order obtained. Will continue to assess.  1200 ml fluid restriction started. Awaiting at HD today which is scheduled aroung 1700.

## 2019-04-22 NOTE — Progress Notes (Signed)
PT Cancellation Note  Patient Details Name: [name] MRN: [id number] DOB: 1946/11/23   Cancelled Treatment:    Reason Eval/Treat Not Completed: Other (comment)(Pt had temp femoral catheter placed yesterday, scheduled to receive first HD treatment today per chart review. PT to hold evaluation until tomorrow. )   Olga Coaster PT, DPT 2:27 PM,04/22/19 [phone]

## 2019-04-22 NOTE — Progress Notes (Signed)
Pharmacy Electrolyte Monitoring Consult:  Pharmacy consulted to assist in monitoring and replacing electrolytes in this 73 y.o. female admitted on 04/14/2019 with Nausea and Emesis   Labs:  Sodium (mmol/L)  Date Value  04/22/2019 140  12/25/2014 141   Potassium (mmol/L)  Date Value  04/22/2019 3.1 (L)  12/25/2014 3.5   Magnesium (mg/dL)  Date Value  54/36/0677 2.0   Phosphorus (mg/dL)  Date Value  03/40/3524 3.0   Calcium (mg/dL)  Date Value  81/85/9093 7.4 (L)   Calcium, Total (mg/dL)  Date Value  10/14/6243 9.2   Albumin (g/dL)  Date Value  69/50/7225 3.1 (L)    Assessment/Plan: Patient in acute renal failure, serum creatinine continues upward trend.   K: 3.1 this morning. Patient received furosemide 40mg  IV x 1 dose 5/29.   Will need to be cautious with supplementation due to decreasing renal fucntion. Will order KCL po x 1 dose.   Electrolytes with am labs.   Pharmacy will continue to monitor and adjust per consult.   Gardner Candle, PharmD, BCPS Clinical Pharmacist 04/22/2019 5:11 AM

## 2019-04-22 NOTE — Progress Notes (Signed)
[facility] Gastroenterology Inpatient Progress Note  Subjective: Patient seen for f/u EPEC dysentery, elevated liver enzymes. Denies any complaints. Patient seen today in the Dialysis unit.   Objective: Vital signs in last 24 hours: Temp:  [97.5 F (36.4 C)-98 F (36.7 C)] 98 F (36.7 C) (05/30 1730) Pulse Rate:  [50-117] 53 (05/30 1900) Resp:  [10-20] 10 (05/30 1900) BP: (128-209)/(54-90) 156/63 (05/30 1900) SpO2:  [94 %-100 %] 96 % (05/30 1900) Blood pressure (!) 156/63, pulse (!) 53, temperature 98 F (36.7 C), temperature source Oral, resp. rate 10, height 4\' 11"  (1.499 m), weight 57.4 kg, SpO2 96 %.    Intake/Output from previous day: 05/29 0701 - 05/30 0700 In: 10 [I.V.:10] Out: -   Intake/Output this shift: No intake/output data recorded.   General appearance: Alert, NAD. Resp: Few rales. NO rhonchi. Normal chest expansion. Cardio: RRR GI: soft, BS+, non-tender. Extremities: Trace edema.   Lab Results: Results for orders placed or performed during the hospital encounter of 04/14/19 (from the past 24 hour(s))  Comprehensive metabolic panel     Status: Abnormal   Collection Time: 04/22/19  2:45 AM  Result Value Ref Range   Sodium 140 135 - 145 mmol/L   Potassium 3.1 (L) 3.5 - 5.1 mmol/L   Chloride 100 98 - 111 mmol/L   CO2 28 22 - 32 mmol/L   Glucose, Bld 133 (H) 70 - 99 mg/dL   BUN 72 (H) 8 - 23 mg/dL   Creatinine, Ser 1.615.39 (H) 0.44 - 1.00 mg/dL   Calcium 7.4 (L) 8.9 - 10.3 mg/dL   Total Protein 5.5 (L) 6.5 - 8.1 g/dL   Albumin 3.1 (L) 3.5 - 5.0 g/dL   AST 096146 (H) 15 - 41 U/L   ALT 108 (H) 0 - 44 U/L   Alkaline Phosphatase 672 (H) 38 - 126 U/L   Total Bilirubin 0.9 0.3 - 1.2 mg/dL   GFR calc non Af Amer 7 (L) >60 mL/min   GFR calc Af Amer 8 (L) >60 mL/min   Anion gap 12 5 - 15  CBC     Status: Abnormal   Collection Time: 04/22/19  2:45 AM  Result Value Ref Range   WBC 14.3 (H) 4.0 - 10.5 K/uL   RBC 2.88 (L) 3.87 - 5.11 MIL/uL   Hemoglobin 8.8  (L) 12.0 - 15.0 g/dL   HCT 04.525.9 (L) 40.936.0 - 81.146.0 %   MCV 89.9 80.0 - 100.0 fL   MCH 30.6 26.0 - 34.0 pg   MCHC 34.0 30.0 - 36.0 g/dL   RDW 91.414.6 78.211.5 - 95.615.5 %   Platelets 151 150 - 400 K/uL   nRBC 0.0 0.0 - 0.2 %     Recent Labs    04/20/19 0450 04/20/19 1045 04/21/19 0352 04/22/19 0245  WBC 12.4*  --  15.4* 14.3*  HGB 9.3* 10.0* 9.7* 8.8*  HCT 26.4* 28.8* 27.3* 25.9*  PLT 126*  --  152 151   BMET Recent Labs    04/20/19 0450 04/21/19 0352 04/22/19 0245  NA 140 140 140  K 3.3* 3.7 3.1*  CL 101 102 100  CO2 25 25 28   GLUCOSE 156* 109* 133*  BUN 66* 65* 72*  CREATININE 4.61* 5.04* 5.39*  CALCIUM 6.4* 7.2* 7.4*   LFT Recent Labs    04/22/19 0245  PROT 5.5*  ALBUMIN 3.1*  AST 146*  ALT 108*  ALKPHOS 672*  BILITOT 0.9   PT/INR Recent Labs    04/20/19 1741  LABPROT 11.9  INR 0.9   Hepatitis Panel No results for input(s): HEPBSAG, HCVAB, HEPAIGM, HEPBIGM in the last 72 hours. C-Diff No results for input(s): CDIFFTOX in the last 72 hours. No results for input(s): CDIFFPCR in the last 72 hours.   Studies/Results: No results found.  Scheduled Inpatient Medications:   . aspirin  81 mg Oral Daily  . Chlorhexidine Gluconate Cloth  6 each Topical Daily  . ciprofloxacin  500 mg Oral Daily  . hydrocortisone sod succinate (SOLU-CORTEF) inj  25 mg Intravenous Q12H  . midodrine  10 mg Oral TID WC  . pantoprazole (PROTONIX) IV  40 mg Intravenous Q12H  . sodium chloride flush  10-40 mL Intracatheter Q12H  . tuberculin  5 Units Intradermal Once  . venlafaxine XR  37.5 mg Oral Q breakfast    Continuous Inpatient Infusions:   . sodium chloride Stopped (04/20/19 1419)  . sodium chloride      PRN Inpatient Medications:  sodium chloride, hydrALAZINE, meclizine, ondansetron **OR** ondansetron (ZOFRAN) IV, senna-docusate, sodium chloride flush, sodium chloride flush    Assessment/Plan:  1. : EPEC - continue cipro. 2. Elevated liver enzymes - Agree could  possibly be from Amiodarone. Follow. 3. Sepsis - Improving. 4. AKI - on temporary Hemodialysis.     Teodoro K. Norma Fredrickson, M.D. 04/22/2019, 7:11 PM

## 2019-04-23 LAB — BASIC METABOLIC PANEL
Anion gap: 13 (ref 5–15)
BUN: 53 mg/dL — ABNORMAL HIGH (ref 8–23)
CO2: 28 mmol/L (ref 22–32)
Calcium: 7.8 mg/dL — ABNORMAL LOW (ref 8.9–10.3)
Chloride: 102 mmol/L (ref 98–111)
Creatinine, Ser: 4.1 mg/dL — ABNORMAL HIGH (ref 0.44–1.00)
GFR calc Af Amer: 12 mL/min — ABNORMAL LOW (ref >60)
GFR calc non Af Amer: 10 mL/min — ABNORMAL LOW (ref >60)
Glucose, Bld: 109 mg/dL — ABNORMAL HIGH (ref 70–99)
Potassium: 2.8 mmol/L — ABNORMAL LOW (ref 3.5–5.1)
Sodium: 143 mmol/L (ref 135–145)

## 2019-04-23 LAB — MAGNESIUM: Magnesium: 1.8 mg/dL (ref 1.7–2.4)

## 2019-04-23 LAB — POTASSIUM: Potassium: 3.4 mmol/L — ABNORMAL LOW (ref 3.5–5.1)

## 2019-04-23 MED ORDER — HEPARIN SODIUM (PORCINE) 5000 UNIT/ML IJ SOLN
5000.0000 [IU] | Freq: Three times a day (TID) | INTRAMUSCULAR | Status: DC
  Administered 2019-04-23 – 2019-04-25 (×6): 5000 [IU] via SUBCUTANEOUS
  Filled 2019-04-23 (×6): qty 1

## 2019-04-23 MED ORDER — PREDNISONE 50 MG PO TABS
50.0000 mg | ORAL_TABLET | Freq: Every day | ORAL | Status: DC
  Administered 2019-04-24 – 2019-04-26 (×3): 50 mg via ORAL
  Filled 2019-04-23 (×3): qty 1

## 2019-04-23 MED ORDER — POTASSIUM CHLORIDE 20 MEQ PO PACK: 40.0000 meq | PACK | Freq: Once | ORAL | Status: DC

## 2019-04-23 MED ORDER — ENOXAPARIN SODIUM 30 MG/0.3ML ~~LOC~~ SOLN: 30.0000 mg | SUBCUTANEOUS | Status: DC

## 2019-04-23 MED ORDER — MAGNESIUM SULFATE IN D5W 1-5 GM/100ML-% IV SOLN: 1.0000 g | Freq: Once | INTRAVENOUS | Status: DC

## 2019-04-23 MED ORDER — POTASSIUM CHLORIDE 20 MEQ PO PACK: 20.0000 meq | PACK | Freq: Once | ORAL | Status: DC

## 2019-04-23 MED ORDER — MORPHINE SULFATE (PF) 2 MG/ML IV SOLN: 1.0000 mg | Freq: Once | INTRAVENOUS | Status: DC

## 2019-04-23 MED ORDER — POTASSIUM CHLORIDE CRYS ER 20 MEQ PO TBCR
40.0000 meq | EXTENDED_RELEASE_TABLET | ORAL | Status: AC
  Administered 2019-04-23 (×2): 40 meq via ORAL
  Filled 2019-04-23 (×2): qty 2

## 2019-04-23 MED ORDER — DILTIAZEM HCL 25 MG/5ML IV SOLN
10.0000 mg | Freq: Once | INTRAVENOUS | Status: DC
  Filled 2019-04-23: qty 5

## 2019-04-23 MED ORDER — POTASSIUM CHLORIDE CRYS ER 10 MEQ PO TBCR
10.0000 meq | EXTENDED_RELEASE_TABLET | Freq: Once | ORAL | Status: AC
  Administered 2019-04-23: 10 meq via ORAL
  Filled 2019-04-23: qty 1

## 2019-04-23 NOTE — Progress Notes (Signed)
[facility] Gastroenterology Inpatient Progress Note  Subjective: Patient seen for follow-up of enteropathogenic E. coli infection, elevated liver enzymes.  Patient is status post hemodialysis yesterday.  Complains of nausea and lightheadedness.  No pain.  Is improving.  Patient is afebrile.  Objective: Vital signs in last 24 hours: Temp:  [97.7 F (36.5 C)-98.2 F (36.8 C)] 97.9 F (36.6 C) (05/31 1016) Pulse Rate:  [47-117] 60 (05/31 1110) Resp:  [10-20] 20 (05/31 1100) BP: (124-209)/(54-83) 181/70 (05/31 1110) SpO2:  [94 %-99 %] 97 % (05/31 1100) Blood pressure (!) 181/70, pulse 60, temperature 97.9 F (36.6 C), temperature source Oral, resp. rate 20, height 4\' 11"  (1.499 m), weight 57.4 kg, SpO2 97 %.    Intake/Output from previous day: 05/30 0701 - 05/31 0700 In: 250 [P.O.:240; I.V.:10] Out: 1200 [Urine:1200]  Intake/Output this shift: Total I/O In: 360 [P.O.:360] Out: -    General appearance: Alert, mildly  ill-appearing in no distress Resp: Clear to auscultation bilaterally. Cardio: Regular rate, no gallop GI: Soft, nontender, no masses, rebound or rigidity.  Bowel sounds are positive. Extremities: no edema.   Lab Results: Results for orders placed or performed during the hospital encounter of 04/14/19 (from the past 24 hour(s))  Basic metabolic panel     Status: Abnormal   Collection Time: 04/23/19  4:14 AM  Result Value Ref Range   Sodium 143 135 - 145 mmol/L   Potassium 2.8 (L) 3.5 - 5.1 mmol/L   Chloride 102 98 - 111 mmol/L   CO2 28 22 - 32 mmol/L   Glucose, Bld 109 (H) 70 - 99 mg/dL   BUN 53 (H) 8 - 23 mg/dL   Creatinine, Ser 9.41 (H) 0.44 - 1.00 mg/dL   Calcium 7.8 (L) 8.9 - 10.3 mg/dL   GFR calc non Af Amer 10 (L) >60 mL/min   GFR calc Af Amer 12 (L) >60 mL/min   Anion gap 13 5 - 15  Magnesium     Status: None   Collection Time: 04/23/19  4:14 AM  Result Value Ref Range   Magnesium 1.8 1.7 - 2.4 mg/dL     Recent Labs    74/08/14 0352  04/22/19 0245  WBC 15.4* 14.3*  HGB 9.7* 8.8*  HCT 27.3* 25.9*  PLT 152 151   BMET Recent Labs    04/21/19 0352 04/22/19 0245 04/23/19 0414  NA 140 140 143  K 3.7 3.1* 2.8*  CL 102 100 102  CO2 25 28 28   GLUCOSE 109* 133* 109*  BUN 65* 72* 53*  CREATININE 5.04* 5.39* 4.10*  CALCIUM 7.2* 7.4* 7.8*   LFT Recent Labs    04/22/19 0245  PROT 5.5*  ALBUMIN 3.1*  AST 146*  ALT 108*  ALKPHOS 672*  BILITOT 0.9   PT/INR Recent Labs    04/20/19 1741  LABPROT 11.9  INR 0.9   Hepatitis Panel No results for input(s): HEPBSAG, HCVAB, HEPAIGM, HEPBIGM in the last 72 hours. C-Diff No results for input(s): CDIFFTOX in the last 72 hours. No results for input(s): CDIFFPCR in the last 72 hours.   Studies/Results: No results found.  Scheduled Inpatient Medications:   . aspirin  81 mg Oral Daily  . ciprofloxacin  500 mg Oral Daily  . hydrocortisone sod succinate (SOLU-CORTEF) inj  25 mg Intravenous Q12H  . midodrine  10 mg Oral TID WC  . pantoprazole (PROTONIX) IV  40 mg Intravenous Q12H  . sodium chloride flush  10-40 mL Intracatheter Q12H  . tuberculin  5 Units Intradermal Once  . venlafaxine XR  37.5 mg Oral Q breakfast    Continuous Inpatient Infusions:   . sodium chloride Stopped (04/20/19 1419)  . sodium chloride      PRN Inpatient Medications:  sodium chloride, hydrALAZINE, meclizine, ondansetron **OR** ondansetron (ZOFRAN) IV, senna-docusate, sodium chloride flush, sodium chloride flush   Assessment:  1.  EPEC colitis, clinically improving. Continue Cipro.  2. Elevated liver enzymes, amiodarone discontinued. Stable, currently unknown cause, possibly from medication. No fluid overload presently to suggest congestive hepatopathy. Enzymes too low to suggest ischemic hepatitis. Definitely an acute phenomenon, so doubt any issues with chronic liver disease.  3. AKI - on Hemodialysis.  Plan:  1. No new recommendations. Continuing to follow.    Teodoro K.  Norma Fredricksonoledo, M.D. 04/23/2019, 1:32 PM

## 2019-04-23 NOTE — Progress Notes (Signed)
PT Cancellation Note  Patient Details Name: Sarah Sullivan MRN: [id number] DOB: 1946-10-18   Cancelled Treatment:    Reason Eval/Treat Not Completed: Other (comment)(PT entered room. Pt emotional, overwhelmed, anxious. Reported that she really wanted her breakfast, has had too much medications and has too many things hooked to her, gesturing to IV and fem cath. Pt declining PT at this time. RN notified of pt status. )   Olga Coaster PT, DPT 10:22 AM,04/23/19 [phone]

## 2019-04-23 NOTE — Progress Notes (Addendum)
Pharmacy Electrolyte Monitoring Consult:  Pharmacy consulted to assist in monitoring and replacing electrolytes in this 73 y.o. female admitted on 04/14/2019 with Nausea and Emesis   Labs:  Sodium (mmol/L)  Date Value  04/23/2019 143  12/25/2014 141   Potassium (mmol/L)  Date Value  04/23/2019 2.8 (L)  12/25/2014 3.5   Magnesium (mg/dL)  Date Value  68/09/5725 1.8   Phosphorus (mg/dL)  Date Value  20/35/5974 3.0   Calcium (mg/dL)  Date Value  16/38/4536 7.8 (L)   Calcium, Total (mg/dL)  Date Value  46/80/3212 9.2   Albumin (g/dL)  Date Value  24/82/5003 3.1 (L)    Assessment/Plan: Patient in acute renal failure, started on HD on 5/30  K: 2.8 this morning. MD have ordered KCL every 4 hours x 2.  Will need to be cautious with supplementation due to renal fucntion.  Will order F/U K+ level this evening @ 1800.   Pharmacy will continue to monitor and adjust per consult.   Gardner Candle, PharmD, BCPS Clinical Pharmacist 04/23/2019 5:48 AM

## 2019-04-23 NOTE — Progress Notes (Addendum)
[facility]  ROUNDING NOTE   Subjective:   First hemodialysis treatment yesterday. Tolerated treatment well. UF of even. UOP 1200 recorded  Patient complains of nausea, poor appetite and increasing edema.   Hypertensive - receiving IV hydralazine  Objective:  Vital signs in last 24 hours:  Temp:  [97.7 F (36.5 C)-98.2 F (36.8 C)] 97.9 F (36.6 C) (05/31 1016) Pulse Rate:  [47-117] 60 (05/31 1110) Resp:  [10-20] 20 (05/31 1100) BP: (124-209)/(54-83) 181/70 (05/31 1110) SpO2:  [94 %-99 %] 97 % (05/31 1100)  Weight change:  Filed Weights   04/17/19 0301 04/17/19 1246 04/21/19 1532  Weight: 55.1 kg 57.4 kg 57.4 kg    Intake/Output: I/O last 3 completed shifts: In: 260 [P.O.:240; I.V.:20] Out: 1200 [Urine:1200]   Intake/Output this shift:  Total I/O In: 360 [P.O.:360] Out: -   Physical Exam: General: NAD,   Head: Normocephalic, atraumatic. Moist oral mucosal membranes  Eyes: Anicteric, PERRL  Neck: Supple, trachea midline  Lungs:  Bilateral crackles  Heart: irregular  Abdomen:  Mild distension, nontender  Extremities: ++ peripheral edema.  Neurologic: Nonfocal, moving all four extremities  Skin: No lesions   Access: Left femoral temp HD catheter 5/29 Dr. Wyn Quaker    Basic Metabolic Panel: Recent Labs  Lab 04/19/19 0556 04/20/19 0450 04/21/19 0352 04/22/19 0245 04/23/19 0414  NA 137 140 140 140 143  K 3.7 3.3* 3.7 3.1* 2.8*  CL 106 101 102 100 102  CO2 21* GLUCOSE 149* 156* 109* 133* 109*  BUN 63* 66* 65* 72* 53*  CREATININE 4.32* 4.61* 5.04* 5.39* 4.10*  CALCIUM 6.1* 6.4* 7.2* 7.4* 7.8*  MG  --   --   --   --  1.8    Liver Function Tests: Recent Labs  Lab 04/18/19 0032 04/19/19 0556 04/20/19 0450 04/21/19 0352 04/22/19 0245  AST 485* 204* 133* 113* 146*  ALT 286* 172* 123* 105* 108*  ALKPHOS 321* 423* 573* 663* 672*  BILITOT 1.7* 1.1 1.0 1.0 0.9  PROT 4.4* 3.9* 4.6* 5.1* 5.5*  ALBUMIN 1.8* 1.6* 2.1* 2.5* 3.1*    No results for input(s): LIPASE, AMYLASE in the last 168 hours. No results for input(s): AMMONIA in the last 168 hours.  CBC: Recent Labs  Lab 04/17/19 0436  04/18/19 0646  04/19/19 0556  04/19/19 1954 04/20/19 0450 04/20/19 1045 04/21/19 0352 04/22/19 0245  WBC 5.5   < > 7.3  --  9.2  --   --  12.4*  --  15.4* 14.3*  NEUTROABS 4.2  --   --   --  7.1  --   --   --   --  9.9*  --   HGB 7.6*   < > 8.1*  8.1*   < > 9.6*   < > 9.8* 9.3* 10.0* 9.7* 8.8*  HCT 24.9*   < > 24.3*  24.4*   < > 28.4*   < > 28.0* 26.4* 28.8* 27.3* 25.9*  MCV 101.6*   < > 93.1  --  89.9  --   --  86.8  --  85.8 89.9  PLT 90*   < > 85*  --  95*  --   --  126*  --  152 151   < > = values in this interval not displayed.    Cardiac Enzymes: No results for input(s): CKTOTAL, CKMB, CKMBINDEX, TROPONINI in the last 168 hours.  BNP: Invalid input(s): POCBNP  CBG: No results for input(s):  GLUCAP in the last 168 hours.  Microbiology: Results for orders placed or performed during the hospital encounter of 04/14/19  SARS Coronavirus 2 (CEPHEID - Performed in [facility] hospital lab), Hosp Order     Status: None   Collection Time: 04/14/19 10:32 AM  Result Value Ref Range Status   SARS Coronavirus 2 NEGATIVE NEGATIVE Final    Comment: (NOTE) If result is NEGATIVE SARS-CoV-2 target nucleic acids are NOT DETECTED. The SARS-CoV-2 RNA is generally detectable in upper and lower  respiratory specimens during the acute phase of infection. The lowest  concentration of SARS-CoV-2 viral copies this assay can detect is 250  copies / mL. A negative result does not preclude SARS-CoV-2 infection  and should not be used as the sole basis for treatment or other  patient management decisions.  A negative result may occur with  improper specimen collection / handling, submission of specimen other  than nasopharyngeal swab, presence of viral mutation(s) within the  areas targeted by this assay, and inadequate number of  viral copies  (<250 copies / mL). A negative result must be combined with clinical  observations, patient history, and epidemiological information. If result is POSITIVE SARS-CoV-2 target nucleic acids are DETECTED. The SARS-CoV-2 RNA is generally detectable in upper and lower  respiratory specimens dur ing the acute phase of infection.  Positive  results are indicative of active infection with SARS-CoV-2.  Clinical  correlation with patient history and other diagnostic information is  necessary to determine patient infection status.  Positive results do  not rule out bacterial infection or co-infection with other viruses. If result is PRESUMPTIVE POSTIVE SARS-CoV-2 nucleic acids MAY BE PRESENT.   A presumptive positive result was obtained on the submitted specimen  and confirmed on repeat testing.  While 2019 novel coronavirus  (SARS-CoV-2) nucleic acids may be present in the submitted sample  additional confirmatory testing may be necessary for epidemiological  and / or clinical management purposes  to differentiate between  SARS-CoV-2 and other Sarbecovirus currently known to infect humans.  If clinically indicated additional testing with an alternate test  methodology [phone]) is advised. The SARS-CoV-2 RNA is generally  detectable in upper and lower respiratory sp ecimens during the acute  phase of infection. The expected result is Negative. Fact Sheet for Patients:  BoilerBrush.com.cy Fact Sheet for Healthcare Providers: https://pope.com/ This test is not yet approved or cleared by the [place] FDA and has been authorized for detection and/or diagnosis of SARS-CoV-2 by FDA under an Emergency Use Authorization (EUA).  This EUA will remain in effect (meaning this test can be used) for the duration of the COVID-19 declaration under Section 564(b)(1) of the Act, 21 U.S.C. section 360bbb-3(b)(1), unless the authorization is  terminated or revoked sooner. Performed at [facility], [address] [place], [place] [address]   MRSA PCR Screening     Status: None   Collection Time: 04/14/19  7:22 PM  Result Value Ref Range Status   MRSA by PCR NEGATIVE NEGATIVE Final    Comment:        The GeneXpert MRSA Assay (FDA approved for NASAL specimens only), is one component of a comprehensive MRSA colonization surveillance program. It is not intended to diagnose MRSA infection nor to guide or monitor treatment for MRSA infections. Performed at [facility], [address] [place], [place] [address]   CULTURE, BLOOD (ROUTINE X 2) w Reflex to ID Panel     Status: None   Collection Time:  04/16/19  7:47 AM  Result Value Ref Range Status   Specimen Description BLOOD BLOOD RIGHT HAND  Final   Special Requests   Final    BOTTLES DRAWN AEROBIC AND ANAEROBIC Blood Culture adequate volume   Culture   Final    NO GROWTH 5 DAYS Performed at [facility], [address] [place], [place] [address]    Report Status 04/21/2019 FINAL  Final  CULTURE, BLOOD (ROUTINE X 2) w Reflex to ID Panel     Status: None   Collection Time: 04/16/19  9:27 AM  Result Value Ref Range Status   Specimen Description BLOOD BLOOD RIGHT HAND  Final   Special Requests   Final    BOTTLES DRAWN AEROBIC ONLY Blood Culture adequate volume   Culture   Final    NO GROWTH 5 DAYS Performed at [facility], [address] [place], [place] [address]    Report Status 04/21/2019 FINAL  Final  MRSA PCR Screening     Status: None   Collection Time: 04/17/19  1:47 PM  Result Value Ref Range Status   MRSA by PCR NEGATIVE NEGATIVE Final    Comment:        The GeneXpert MRSA Assay (FDA approved for NASAL specimens only), is one component of a comprehensive MRSA colonization surveillance program. It is not intended to diagnose MRSA infection nor to guide or monitor treatment for MRSA infections. Performed  at [facility], [address] [place], [place] [address]   Gastrointestinal Panel by PCR , Stool     Status: Abnormal   Collection Time: 04/17/19 10:42 PM  Result Value Ref Range Status   Campylobacter species NOT DETECTED NOT DETECTED Final   Plesimonas shigelloides NOT DETECTED NOT DETECTED Final   Salmonella species NOT DETECTED NOT DETECTED Final   Yersinia enterocolitica NOT DETECTED NOT DETECTED Final   Vibrio species NOT DETECTED NOT DETECTED Final   Vibrio cholerae NOT DETECTED NOT DETECTED Final   Enteroaggregative E coli (EAEC) NOT DETECTED NOT DETECTED Final   Enteropathogenic E coli (EPEC) DETECTED (A) NOT DETECTED Final    Comment: RESULT CALLED TO, READ BACK BY AND VERIFIED WITH: BETH BUONO AT 0211 04/19/2019 SDR    Enterotoxigenic E coli (ETEC) NOT DETECTED NOT DETECTED Final   Shiga like toxin producing E coli (STEC) NOT DETECTED NOT DETECTED Final   Shigella/Enteroinvasive E coli (EIEC) NOT DETECTED NOT DETECTED Final   Cryptosporidium NOT DETECTED NOT DETECTED Final   Cyclospora cayetanensis NOT DETECTED NOT DETECTED Final   Entamoeba histolytica NOT DETECTED NOT DETECTED Final   Giardia lamblia NOT DETECTED NOT DETECTED Final   Adenovirus F40/41 NOT DETECTED NOT DETECTED Final   Astrovirus NOT DETECTED NOT DETECTED Final   Norovirus GI/GII NOT DETECTED NOT DETECTED Final   Rotavirus A NOT DETECTED NOT DETECTED Final   Sapovirus (I, II, IV, and V) NOT DETECTED NOT DETECTED Final    Comment: Performed at [facility], [address] [place], [place] [address]  C difficile quick scan w PCR reflex     Status: None   Collection Time: 04/18/19 10:42 PM  Result Value Ref Range Status   C Diff antigen NEGATIVE NEGATIVE Final   C Diff toxin NEGATIVE NEGATIVE Final   C Diff interpretation No C. difficile detected.  Final    Comment: Performed at [facility], [address] [place], [place] [address]    Coagulation Studies: Recent Labs     04/20/19 [phone]  LABPROT 11.9  INR 0.9    Urinalysis: No results for input(s): COLORURINE, LABSPEC, PHURINE, GLUCOSEU, HGBUR, BILIRUBINUR, KETONESUR, PROTEINUR, UROBILINOGEN, NITRITE, LEUKOCYTESUR in the last 72 hours.  Invalid input(s): APPERANCEUR    Imaging: No results found.   Medications:   . sodium chloride Stopped (04/20/19 1419)  . sodium chloride     . aspirin  81 mg Oral Daily  . Chlorhexidine Gluconate Cloth  6 each Topical Daily  . ciprofloxacin  500 mg Oral Daily  . hydrocortisone sod succinate (SOLU-CORTEF) inj  25 mg Intravenous Q12H  . midodrine  10 mg Oral TID WC  . pantoprazole (PROTONIX) IV  40 mg Intravenous Q12H  . sodium chloride flush  10-40 mL Intracatheter Q12H  . tuberculin  5 Units Intradermal Once  . venlafaxine XR  37.5 mg Oral Q breakfast   sodium chloride, hydrALAZINE, meclizine, ondansetron **OR** ondansetron (ZOFRAN) IV, senna-docusate, sodium chloride flush, sodium chloride flush  Assessment/ Plan:  Ms. Sarah Sullivan is a 73 y.o. Asian female with a PMHx of hypertension, degenerative disc disease, lumbar radiculitis, history of depression, osteopenia, who was [facility] on 04[facility]14[facility]2019 for hypovolumic shock with E. Coli colitis.   1.  Acute renal failure with metabolic acidosis: secondary to prerenal azotemia, GI losses and blood losses. ATN. Nonoliguric urine output.  Hematuria and proteinuria on admission.  Creatinine baseline of 1, GFR of 56 on 03/27/19. Consistent with chronic kidney disease stage III.  First hemodialysis treatment yesterday. Tolerated treatment well.  Will plan on hemodialysis again tomorrow. Second treatment.   2. Hypertension with atrial fibrillation. Elevated, continues to midodrine and stress dose steroids.  Home regimen of chlorthalidone, losartan and felodipine - wean solucortef - Will discontinue midodrine - Restart felodipine or equivalent calcium channel blocker if blood pressure continues to be  elevated.   3. Anemia with renal failure: status post 1 unit PRBC 5/26. With thrombocytopenia Appreciate Hematology input - EPO with HD treatment.   4. Colitis: e. Coli - ciprofloxacin.    LOS: 7 Nivaan Dicenzo 5/31/20201:24 PM

## 2019-04-23 NOTE — Progress Notes (Signed)
Patient ID: Sarah Sullivan, female   DOB: February 04, 1946, 73 y.o.   MRN: [id number]  [facility] PROGRESS NOTE  Sarah Sullivan YQM:[id number] DOB: February 04, 1946 DOA: 04/14/2019 PCP: Titus MouldWhite, Elizabeth Burney, NP  HPI/Subjective: Patient feeling nauseous and lightheaded today.  Patient had temporary hemodialysis catheter placement 04/21/2019 nausea but no vomiting.  Abdominal pain is less.    Objective: Vitals:   04/23/19 1520 04/23/19 1521  BP: (!) 179/70 (!) 181/71  Pulse: 67 66  Resp: 20   Temp: 97.7 F (36.5 C)   SpO2: 97% 96%    Filed Weights   04/17/19 0301 04/17/19 1246 04/21/19 1532  Weight: 55.1 kg 57.4 kg 57.4 kg    ROS: Review of Systems  Constitutional: Negative for chills and fever.  Eyes: Negative for blurred vision.  Respiratory: Negative for cough and shortness of breath.   Cardiovascular: Negative for chest pain.  Gastrointestinal: Positive for nausea. Negative for abdominal pain, constipation, diarrhea and vomiting.  Genitourinary: Negative for dysuria.  Musculoskeletal: Negative for joint pain.  Neurological: Negative for dizziness and headaches.   Exam: Physical Exam  Constitutional: She is oriented to person, place, and time.  HENT:  Nose: No mucosal edema.  Mouth/Throat: No oropharyngeal exudate or posterior oropharyngeal edema.  Eyes: Pupils are equal, round, and reactive to light. Conjunctivae, EOM and lids are normal.  Neck: No JVD present. Carotid bruit is not present. No edema present. No thyroid mass and no thyromegaly present.  Cardiovascular: S1 normal and S2 normal. Exam reveals no gallop.  No murmur heard. Pulses:      Dorsalis pedis pulses are 2+ on the right side and 2+ on the left side.  Respiratory: No respiratory distress. She has decreased breath sounds in the right lower field and the left lower field. She has no wheezes. She has no rhonchi. She has no rales.  GI: Soft. Bowel sounds are normal. There is generalized abdominal tenderness.   Left groin with temporary TLC catheter, no active bleeding  Musculoskeletal:     Right ankle: She exhibits no swelling.     Left ankle: She exhibits no swelling.  Lymphadenopathy:    She has no cervical adenopathy.  Neurological: She is alert and oriented to person, place, and time. No cranial nerve deficit.  Skin: Skin is warm. No rash noted. Nails show no clubbing.  Psychiatric: She has a normal mood and affect.      Data Reviewed: Basic Metabolic Panel: Recent Labs  Lab 04/19/19 0556 04/20/19 0450 04/21/19 0352 04/22/19 0245 04/23/19 0414  NA 137 140 140 140 143  K 3.7 3.3* 3.7 3.1* 2.8*  CL 106 101 102 100 102  CO2 21* 25 25 28 28   GLUCOSE 149* 156* 109* 133* 109*  BUN 63* 66* 65* 72* 53*  CREATININE 4.32* 4.61* 5.04* 5.39* 4.10*  CALCIUM 6.1* 6.4* 7.2* 7.4* 7.8*  MG  --   --   --   --  1.8   Liver Function Tests: Recent Labs  Lab 04/18/19 0032 04/19/19 0556 04/20/19 0450 04/21/19 0352 04/22/19 0245  AST 485* 204* 133* 113* 146*  ALT 286* 172* 123* 105* 108*  ALKPHOS 321* 423* 573* 663* 672*  BILITOT 1.7* 1.1 1.0 1.0 0.9  PROT 4.4* 3.9* 4.6* 5.1* 5.5*  ALBUMIN 1.8* 1.6* 2.1* 2.5* 3.1*   No results for input(s): LIPASE, AMYLASE in the last 168 hours. CBC: Recent Labs  Lab 04/17/19 0436  04/18/19 [id number]  04/19/19 0556  04/19/19 1954 04/20/19 0450 04/20/19 1045 04/21/19 0352  04/22/19 0245  WBC 5.5   < > 7.3  --  9.2  --   --  12.4*  --  15.4* 14.3*  NEUTROABS 4.2  --   --   --  7.1  --   --   --   --  9.9*  --   HGB 7.6*   < > 8.1*  8.1*   < > 9.6*   < > 9.8* 9.3* 10.0* 9.7* 8.8*  HCT 24.9*   < > 24.3*  24.4*   < > 28.4*   < > 28.0* 26.4* 28.8* 27.3* 25.9*  MCV 101.6*   < > 93.1  --  89.9  --   --  86.8  --  85.8 89.9  PLT 90*   < > 85*  --  95*  --   --  126*  --  152 151   < > = values in this interval not displayed.   Cardiac Enzymes: No results for input(s): CKTOTAL, CKMB, CKMBINDEX, TROPONINI in the last 168 hours.   Recent Results (from  the past 240 hour(s))  SARS Coronavirus 2 (CEPHEID - Performed in [facility] hospital lab), Hosp Order     Status: None   Collection Time: 04/14/19 10:32 AM  Result Value Ref Range Status   SARS Coronavirus 2 NEGATIVE NEGATIVE Final    Comment: (NOTE) If result is NEGATIVE SARS-CoV-2 target nucleic acids are NOT DETECTED. The SARS-CoV-2 RNA is generally detectable in upper and lower  respiratory specimens during the acute phase of infection. The lowest  concentration of SARS-CoV-2 viral copies this assay can detect is 250  copies / mL. A negative result does not preclude SARS-CoV-2 infection  and should not be used as the sole basis for treatment or other  patient management decisions.  A negative result may occur with  improper specimen collection / handling, submission of specimen other  than nasopharyngeal swab, presence of viral mutation(s) within the  areas targeted by this assay, and inadequate number of viral copies  (<250 copies / mL). A negative result must be combined with clinical  observations, patient history, and epidemiological information. If result is POSITIVE SARS-CoV-2 target nucleic acids are DETECTED. The SARS-CoV-2 RNA is generally detectable in upper and lower  respiratory specimens dur ing the acute phase of infection.  Positive  results are indicative of active infection with SARS-CoV-2.  Clinical  correlation with patient history and other diagnostic information is  necessary to determine patient infection status.  Positive results do  not rule out bacterial infection or co-infection with other viruses. If result is PRESUMPTIVE POSTIVE SARS-CoV-2 nucleic acids MAY BE PRESENT.   A presumptive positive result was obtained on the submitted specimen  and confirmed on repeat testing.  While 2019 novel coronavirus  (SARS-CoV-2) nucleic acids may be present in the submitted sample  additional confirmatory testing may be necessary for epidemiological  and / or  clinical management purposes  to differentiate between  SARS-CoV-2 and other Sarbecovirus currently known to infect humans.  If clinically indicated additional testing with an alternate test  methodology [phone]) is advised. The SARS-CoV-2 RNA is generally  detectable in upper and lower respiratory sp ecimens during the acute  phase of infection. The expected result is Negative. Fact Sheet for Patients:  BoilerBrush.com.cy Fact Sheet for Healthcare Providers: https://pope.com/ This test is not yet approved or cleared by the [place] FDA and has been authorized for detection and/or diagnosis of SARS-CoV-2 by FDA under an Emergency  Use Authorization (EUA).  This EUA will remain in effect (meaning this test can be used) for the duration of the COVID-19 declaration under Section 564(b)(1) of the Act, 21 U.S.C. section 360bbb-3(b)(1), unless the authorization is terminated or revoked sooner. Performed at [facility], [address] [place], [place] [address]   MRSA PCR Screening     Status: None   Collection Time: 04/14/19  7:22 PM  Result Value Ref Range Status   MRSA by PCR NEGATIVE NEGATIVE Final    Comment:        The GeneXpert MRSA Assay (FDA approved for NASAL specimens only), is one component of a comprehensive MRSA colonization surveillance program. It is not intended to diagnose MRSA infection nor to guide or monitor treatment for MRSA infections. Performed at [facility], [address] [place], [place] [address]   CULTURE, BLOOD (ROUTINE X 2) w Reflex to ID Panel     Status: None   Collection Time: 04/16/19  7:47 AM  Result Value Ref Range Status   Specimen Description BLOOD BLOOD RIGHT HAND  Final   Special Requests   Final    BOTTLES DRAWN AEROBIC AND ANAEROBIC Blood Culture adequate volume   Culture   Final    NO GROWTH 5 DAYS Performed at [facility], [address]  [place], [place] [address]    Report Status 04/21/2019 FINAL  Final  CULTURE, BLOOD (ROUTINE X 2) w Reflex to ID Panel     Status: None   Collection Time: 04/16/19  9:27 AM  Result Value Ref Range Status   Specimen Description BLOOD BLOOD RIGHT HAND  Final   Special Requests   Final    BOTTLES DRAWN AEROBIC ONLY Blood Culture adequate volume   Culture   Final    NO GROWTH 5 DAYS Performed at [facility], [address] [place], [place] [address]    Report Status 04/21/2019 FINAL  Final  MRSA PCR Screening     Status: None   Collection Time: 04/17/19  1:47 PM  Result Value Ref Range Status   MRSA by PCR NEGATIVE NEGATIVE Final    Comment:        The GeneXpert MRSA Assay (FDA approved for NASAL specimens only), is one component of a comprehensive MRSA colonization surveillance program. It is not intended to diagnose MRSA infection nor to guide or monitor treatment for MRSA infections. Performed at [facility], [address] [place], [place] [address]   Gastrointestinal Panel by PCR , Stool     Status: Abnormal   Collection Time: 04/17/19 10:42 PM  Result Value Ref Range Status   Campylobacter species NOT DETECTED NOT DETECTED Final   Plesimonas shigelloides NOT DETECTED NOT DETECTED Final   Salmonella species NOT DETECTED NOT DETECTED Final   Yersinia enterocolitica NOT DETECTED NOT DETECTED Final   Vibrio species NOT DETECTED NOT DETECTED Final   Vibrio cholerae NOT DETECTED NOT DETECTED Final   Enteroaggregative E coli (EAEC) NOT DETECTED NOT DETECTED Final   Enteropathogenic E coli (EPEC) DETECTED (A) NOT DETECTED Final    Comment: RESULT CALLED TO, READ BACK BY AND VERIFIED WITH: BETH BUONO AT 0211 04/19/2019 SDR    Enterotoxigenic E coli (ETEC) NOT DETECTED NOT DETECTED Final   Shiga like toxin producing E coli (STEC) NOT DETECTED NOT DETECTED Final   Shigella/Enteroinvasive E coli (EIEC) NOT DETECTED NOT DETECTED Final   Cryptosporidium NOT  DETECTED NOT DETECTED Final   Cyclospora cayetanensis NOT  DETECTED NOT DETECTED Final   Entamoeba histolytica NOT DETECTED NOT DETECTED Final   Giardia lamblia NOT DETECTED NOT DETECTED Final   Adenovirus F40/41 NOT DETECTED NOT DETECTED Final   Astrovirus NOT DETECTED NOT DETECTED Final   Norovirus GI/GII NOT DETECTED NOT DETECTED Final   Rotavirus A NOT DETECTED NOT DETECTED Final   Sapovirus (I, II, IV, and V) NOT DETECTED NOT DETECTED Final    Comment: Performed at [facility], [address] [place], [place] [address]  C difficile quick scan w PCR reflex     Status: None   Collection Time: 04/18/19 10:42 PM  Result Value Ref Range Status   C Diff antigen NEGATIVE NEGATIVE Final   C Diff toxin NEGATIVE NEGATIVE Final   C Diff interpretation No C. difficile detected.  Final    Comment: Performed at [facility], [address] [place], [place] [address]      Scheduled Meds: . aspirin  81 mg Oral Daily  . ciprofloxacin  500 mg Oral Daily  . hydrocortisone sod succinate (SOLU-CORTEF) inj  25 mg Intravenous Q12H  . pantoprazole (PROTONIX) IV  40 mg Intravenous Q12H  . sodium chloride flush  10-40 mL Intracatheter Q12H  . tuberculin  5 Units Intradermal Once  . venlafaxine XR  37.5 mg Oral Q breakfast   Continuous Infusions: . sodium chloride Stopped (04/20/19 1419)  . sodium chloride      Assessment/Plan:  1. Acute kidney injury.  Likely secondary to hypovolemic shock and GI losses.  Case discussed with nephrology and temporary dialysis catheter placed on 04/21/2019 groin.  Patient had hemodialysis on 04/22/2019.  Nephrology is considering repeat dialysis tomorrow  2. hypo-kalemia replete and recheck in a.m. check magnesium also 3. hypovolemic shock.  Resolved holding antihypertensive medications.  Solu-Cortef to 25 mg twice a day.  Will change to p.o. prednisone which might improve her nausea 4. Enteropathogenic E. coli infection with colitis and  hematochezia.  Continue Cipro.  GI is following no new recommendations at this time 5. Acute blood loss anemia.  Patient responded to 1 unit of blood.  Hemoglobin stable over the last few days.  Hemoglobin at 8.8 6. Atrial fibrillation with rapid ventricular response converted over to normal sinus rhythm with amiodarone.  Now off amiodarone.  Patient is been in sinus bradycardia at this point. 7. Thrombocytopenia continue to monitor blood counts.-Secondary to severe sepsis resolved smear does show some red blood cell fragments.  Platelet count back to normal  8. Elevated liver function test.  Likely with hypotension.  Continue to monitor.  Hepatitis panel negative.  Ultrasound concerning for early cirrhosis even though CAT scan did not comment on this. 9. DVT prophylaxis with Lovenox renal dose adjusted  Code Status:     Code Status Orders  (From admission, onward)         Start     Ordered   04/14/19 1533  Full code  Continuous     04/14/19 1532        Code Status History    This patient has a current code status but no historical code status.     Family Communication: Call placed to husband for update Disposition Plan: To be determined  Consultants:  Critical care specialist  Gastroenterology  Nephrology  Oncology  Time spent: 33 minutes.  Spoke with nephrology  Ramonita Lab  [facility]

## 2019-04-23 NOTE — Progress Notes (Addendum)
Pharmacy Electrolyte Monitoring Consult:  Pharmacy consulted to assist in monitoring and replacing electrolytes in this 73 y.o. female admitted on 04/14/2019 with Nausea and Emesis   Labs:  Sodium (mmol/L)  Date Value  04/23/2019 143  12/25/2014 141   Potassium (mmol/L)  Date Value  04/23/2019 3.4 (L)  12/25/2014 3.5   Magnesium (mg/dL)  Date Value  49/67/5916 1.8   Phosphorus (mg/dL)  Date Value  38/46/6599 3.0   Calcium (mg/dL)  Date Value  35/70/1779 7.8 (L)   Calcium, Total (mg/dL)  Date Value  39/01/91 9.2   Albumin (g/dL)  Date Value  33/00/7622 3.1 (L)    Assessment/Plan: Patient in acute renal failure, started on HD on 5/30 5/31 @1721  K 3.4. Will order KCL PO 10 mEq x1 dose. Will follow up with AM labs.   Will need to be cautious with supplementation due to renal fucntion.   Pharmacy will continue to monitor and adjust per consult.   Katha Cabal, PharmD Clinical Pharmacist 04/23/2019 5:57 PM

## 2019-04-23 NOTE — Plan of Care (Signed)

## 2019-04-23 NOTE — Progress Notes (Addendum)
Pt reports "I feel sick"; "dizzy", reports having nausea. Hydralazine IV given x 1 this a.m. for elevated BP. Pt has been emotional this morning with emotional support given in that "too much medicine,"  Pt refused breakfast. HR stable; Denies chest pain/ SOB. Zofran IV given. BP SBP 180's with MD notified by Epic chat with no new orders at this time. Pt resting with eyes closed. TC with sister updated; pt declined to speak to her on the phone.

## 2019-04-24 ENCOUNTER — Encounter: Payer: Self-pay | Admitting: Vascular Surgery

## 2019-04-24 LAB — CBC
HCT: 22.6 % — ABNORMAL LOW (ref 36.0–46.0)
HCT: 24.7 % — ABNORMAL LOW (ref 36.0–46.0)
Hemoglobin: 7.4 g/dL — ABNORMAL LOW (ref 12.0–15.0)
Hemoglobin: 8.2 g/dL — ABNORMAL LOW (ref 12.0–15.0)
MCH: 29.6 pg (ref 26.0–34.0)
MCH: 30.7 pg (ref 26.0–34.0)
MCHC: 32.7 g/dL (ref 30.0–36.0)
MCHC: 33.2 g/dL (ref 30.0–36.0)
MCV: 90.4 fL (ref 80.0–100.0)
MCV: 92.5 fL (ref 80.0–100.0)
Platelets: 132 10*3/uL — ABNORMAL LOW (ref 150–400)
Platelets: 126 10*3/uL — ABNORMAL LOW (ref 150–400)
RBC: 2.5 MIL/uL — ABNORMAL LOW (ref 3.87–5.11)
RBC: 2.67 MIL/uL — ABNORMAL LOW (ref 3.87–5.11)
RDW: 15 % (ref 11.5–15.5)
RDW: 15.8 % — ABNORMAL HIGH (ref 11.5–15.5)
WBC: 10.8 10*3/uL — ABNORMAL HIGH (ref 4.0–10.5)
WBC: 7.4 10*3/uL (ref 4.0–10.5)
nRBC: 0 % (ref 0.0–0.2)
nRBC: 0.3 % — ABNORMAL HIGH (ref 0.0–0.2)

## 2019-04-24 LAB — MAGNESIUM: Magnesium: 1.8 mg/dL (ref 1.7–2.4)

## 2019-04-24 LAB — BASIC METABOLIC PANEL
Anion gap: 10 (ref 5–15)
BUN: 52 mg/dL — ABNORMAL HIGH (ref 8–23)
CO2: 29 mmol/L (ref 22–32)
Calcium: 8 mg/dL — ABNORMAL LOW (ref 8.9–10.3)
Chloride: 105 mmol/L (ref 98–111)
Creatinine, Ser: 4.49 mg/dL — ABNORMAL HIGH (ref 0.44–1.00)
GFR calc Af Amer: 11 mL/min — ABNORMAL LOW (ref >60)
GFR calc non Af Amer: 9 mL/min — ABNORMAL LOW (ref >60)
Glucose, Bld: 88 mg/dL (ref 70–99)
Potassium: 3.3 mmol/L — ABNORMAL LOW (ref 3.5–5.1)
Sodium: 144 mmol/L (ref 135–145)

## 2019-04-24 LAB — HEMOGLOBIN FREE, PLASMA: Hgb, Plasma: 14.8 mg/dL — ABNORMAL HIGH (ref 0.0–4.9)

## 2019-04-24 MED ORDER — PANTOPRAZOLE SODIUM 40 MG IV SOLR
40.0000 mg | Freq: Two times a day (BID) | INTRAVENOUS | Status: DC
  Administered 2019-04-24 – 2019-04-28 (×8): 40 mg via INTRAVENOUS
  Filled 2019-04-24 (×8): qty 40

## 2019-04-24 MED ORDER — PANTOPRAZOLE SODIUM 40 MG PO TBEC: 40.0000 mg | DELAYED_RELEASE_TABLET | Freq: Two times a day (BID) | ORAL | Status: DC

## 2019-04-24 MED ORDER — SODIUM CHLORIDE 0.9 % IV SOLN
50.0000 ug/h | INTRAVENOUS | Status: DC
  Administered 2019-04-24 – 2019-04-26 (×5): 50 ug/h via INTRAVENOUS
  Filled 2019-04-24 (×9): qty 1

## 2019-04-24 MED ORDER — NEPRO/CARBSTEADY PO LIQD
237.0000 mL | Freq: Three times a day (TID) | ORAL | Status: DC
  Administered 2019-04-24: 21:00:00 237 mL via ORAL

## 2019-04-24 MED ORDER — POTASSIUM CHLORIDE 20 MEQ PO PACK
20.0000 meq | PACK | Freq: Once | ORAL | Status: AC
  Administered 2019-04-24: 20 meq via ORAL
  Filled 2019-04-24: qty 1

## 2019-04-24 MED ORDER — OCTREOTIDE LOAD VIA INFUSION
50.0000 ug | Freq: Once | INTRAVENOUS | Status: AC
  Administered 2019-04-24: 19:00:00 50 ug via INTRAVENOUS
  Filled 2019-04-24: qty 25

## 2019-04-24 NOTE — Progress Notes (Signed)
PT Cancellation Note  Patient Details Name: Sarah Sullivan MRN: [id number] DOB: 07-31-1946   Cancelled Treatment:    Reason Eval/Treat Not Completed: (Evaluation re-attempted.  Patient declined participation with session, "just finished eating".  Encouraged for bed-level therex only (due to L temp fem cath); continued to decline requesting re-attempt next date.  Will re-attempt one additional time next date (patient has now declined x3 consecutive days))   Tylek Boney H. Manson Passey, PT, DPT, NCS 04/24/19, 3:31 PM [phone]

## 2019-04-24 NOTE — Progress Notes (Signed)
[facility]  ROUNDING NOTE   Subjective:   Denies acute c/o  Reports b/l LE edema No SOB States appetite is good  Objective:  Vital signs in last 24 hours:  Temp:  [97.7 F (36.5 C)-98.4 F (36.9 C)] 98.4 F (36.9 C) (06/01 0452) Pulse Rate:  [66-92] 92 (06/01 0452) Resp:  [16-20] 16 (06/01 0452) BP: (112-181)/(70-94) 124/78 (06/01 0452) SpO2:  [96 %-99 %] 99 % (06/01 0452)  Weight change:  Filed Weights   04/17/19 0301 04/17/19 1246 04/21/19 1532  Weight: 55.1 kg 57.4 kg 57.4 kg    Intake/Output: I/O last 3 completed shifts: In: 610 [P.O.:600; I.V.:10] Out: 1252 [Urine:1250; Stool:2]   Intake/Output this shift:  Total I/O In: 6 [I.V.:6] Out: 250 [Urine:250]  Physical Exam: General: NAD,   Head: Normocephalic, atraumatic. Moist oral mucosal membranes  Eyes: Anicteric,    Neck: Supple, trachea midline  Lungs:  Decreased at bases  Heart: irregular  Abdomen:  Mild distension, nontender  Extremities: ++ peripheral edema.  Neurologic: Nonfocal, moving all four extremities  Skin: No lesions   Access: Left femoral temp HD catheter 5/29 Dr. Wyn Quaker    Basic Metabolic Panel: Recent Labs  Lab 04/20/19 0450 04/21/19 0352 04/22/19 0245 04/23/19 0414 04/23/19 1721 04/24/19 0453  NA 140 140 140 143  --  144  K 3.3* 3.7 3.1* 2.8* 3.4* 3.3*  CL 101 102 100 102  --  105  CO2 --  29  GLUCOSE 156* 109* 133* 109*  --  88  BUN 66* 65* 72* 53*  --  52*  CREATININE 4.61* 5.04* 5.39* 4.10*  --  4.49*  CALCIUM 6.4* 7.2* 7.4* 7.8*  --  8.0*  MG  --   --   --  1.8  --  1.8    Liver Function Tests: Recent Labs  Lab 04/18/19 0032 04/19/19 0556 04/20/19 0450 04/21/19 0352 04/22/19 0245  AST 485* 204* 133* 113* 146*  ALT 286* 172* 123* 105* 108*  ALKPHOS 321* 423* 573* 663* 672*  BILITOT 1.7* 1.1 1.0 1.0 0.9  PROT 4.4* 3.9* 4.6* 5.1* 5.5*  ALBUMIN 1.8* 1.6* 2.1* 2.5* 3.1*   No results for input(s): LIPASE, AMYLASE in the last 168  hours. No results for input(s): AMMONIA in the last 168 hours.  CBC: Recent Labs  Lab 04/19/19 0556  04/20/19 0450 04/20/19 1045 04/21/19 0352 04/22/19 0245 04/24/19 0453  WBC 9.2  --  12.4*  --  15.4* 14.3* 10.8*  NEUTROABS 7.1  --   --   --  9.9*  --   --   HGB 9.6*   < > 9.3* 10.0* 9.7* 8.8* 7.4*  HCT 28.4*   < > 26.4* 28.8* 27.3* 25.9* 22.6*  MCV 89.9  --  86.8  --  85.8 89.9 90.4  PLT 95*  --  126*  --  152 151 132*   < > = values in this interval not displayed.    Cardiac Enzymes: No results for input(s): CKTOTAL, CKMB, CKMBINDEX, TROPONINI in the last 168 hours.  BNP: Invalid input(s): POCBNP  CBG: No results for input(s): GLUCAP in the last 168 hours.  Microbiology: Results for orders placed or performed during the hospital encounter of 04/14/19  SARS Coronavirus 2 (CEPHEID - Performed in [facility] hospital lab), [facility] Order     Status: None   Collection Time: 04/14/19 10:32 AM  Result Value Ref Range Status   SARS Coronavirus 2 NEGATIVE NEGATIVE Final  Comment: (NOTE) If result is NEGATIVE SARS-CoV-2 target nucleic acids are NOT DETECTED. The SARS-CoV-2 RNA is generally detectable in upper and lower  respiratory specimens during the acute phase of infection. The lowest  concentration of SARS-CoV-2 viral copies this assay can detect is 250  copies / mL. A negative result does not preclude SARS-CoV-2 infection  and should not be used as the sole basis for treatment or other  patient management decisions.  A negative result may occur with  improper specimen collection / handling, submission of specimen other  than nasopharyngeal swab, presence of viral mutation(s) within the  areas targeted by this assay, and inadequate number of viral copies  (<250 copies / mL). A negative result must be combined with clinical  observations, patient history, and epidemiological information. If result is POSITIVE SARS-CoV-2 target nucleic acids are DETECTED. The SARS-CoV-2  RNA is generally detectable in upper and lower  respiratory specimens dur ing the acute phase of infection.  Positive  results are indicative of active infection with SARS-CoV-2.  Clinical  correlation with patient history and other diagnostic information is  necessary to determine patient infection status.  Positive results do  not rule out bacterial infection or co-infection with other viruses. If result is PRESUMPTIVE POSTIVE SARS-CoV-2 nucleic acids MAY BE PRESENT.   A presumptive positive result was obtained on the submitted specimen  and confirmed on repeat testing.  While 2019 novel coronavirus  (SARS-CoV-2) nucleic acids may be present in the submitted sample  additional confirmatory testing may be necessary for epidemiological  and / or clinical management purposes  to differentiate between  SARS-CoV-2 and other Sarbecovirus currently known to infect humans.  If clinically indicated additional testing with an alternate test  methodology [phone]([id number]) is advised. The SARS-CoV-2 RNA is generally  detectable in upper and lower respiratory sp ecimens during the acute  phase of infection. The expected result is Negative. Fact Sheet for Patients:  BoilerBrush.com.cyhttps://www.fda.gov/media/136312/download Fact Sheet for Healthcare Providers: https://pope.com/https://www.fda.gov/media/136313/download This test is not yet approved or cleared by the [place] FDA and has been authorized for detection and/or diagnosis of SARS-CoV-2 by FDA under an Emergency Use Authorization (EUA).  This EUA will remain in effect (meaning this test can be used) for the duration of the COVID-19 declaration under Section 564(b)(1) of the Act, 21 U.S.C. section 360bbb-3(b)(1), unless the authorization is terminated or revoked sooner. Performed at [facility], [address] [place], [place] [address]   MRSA PCR Screening     Status: None   Collection Time: 04/14/19  7:22 PM  Result Value Ref Range Status   MRSA by PCR  NEGATIVE NEGATIVE Final    Comment:        The GeneXpert MRSA Assay (FDA approved for NASAL specimens only), is one component of a comprehensive MRSA colonization surveillance program. It is not intended to diagnose MRSA infection nor to guide or monitor treatment for MRSA infections. Performed at [facility], [address] [place], [place] [address]   CULTURE, BLOOD (ROUTINE X 2) w Reflex to ID Panel     Status: None   Collection Time: 04/16/19  7:47 AM  Result Value Ref Range Status   Specimen Description BLOOD BLOOD RIGHT HAND  Final   Special Requests   Final    BOTTLES DRAWN AEROBIC AND ANAEROBIC Blood Culture adequate volume   Culture   Final    NO GROWTH 5 DAYS Performed at [facility], [address] [place], [place] [address]  Report Status 04/21/2019 FINAL  Final  CULTURE, BLOOD (ROUTINE X 2) w Reflex to ID Panel     Status: None   Collection Time: 04/16/19  9:27 AM  Result Value Ref Range Status   Specimen Description BLOOD BLOOD RIGHT HAND  Final   Special Requests   Final    BOTTLES DRAWN AEROBIC ONLY Blood Culture adequate volume   Culture   Final    NO GROWTH 5 DAYS Performed at [facility], [address] [place], [place] [address]    Report Status 04/21/2019 FINAL  Final  MRSA PCR Screening     Status: None   Collection Time: 04/17/19  1:47 PM  Result Value Ref Range Status   MRSA by PCR NEGATIVE NEGATIVE Final    Comment:        The GeneXpert MRSA Assay (FDA approved for NASAL specimens only), is one component of a comprehensive MRSA colonization surveillance program. It is not intended to diagnose MRSA infection nor to guide or monitor treatment for MRSA infections. Performed at [facility], [address] [place], [place] [address]   Gastrointestinal Panel by PCR , Stool     Status: Abnormal   Collection Time: 04/17/19 10:42 PM  Result Value Ref Range Status   Campylobacter species NOT  DETECTED NOT DETECTED Final   Plesimonas shigelloides NOT DETECTED NOT DETECTED Final   Salmonella species NOT DETECTED NOT DETECTED Final   Yersinia enterocolitica NOT DETECTED NOT DETECTED Final   Vibrio species NOT DETECTED NOT DETECTED Final   Vibrio cholerae NOT DETECTED NOT DETECTED Final   Enteroaggregative E coli (EAEC) NOT DETECTED NOT DETECTED Final   Enteropathogenic E coli (EPEC) DETECTED (A) NOT DETECTED Final    Comment: RESULT CALLED TO, READ BACK BY AND VERIFIED WITH: BETH BUONO AT 0211 04/19/2019 SDR    Enterotoxigenic E coli (ETEC) NOT DETECTED NOT DETECTED Final   Shiga like toxin producing E coli (STEC) NOT DETECTED NOT DETECTED Final   Shigella/Enteroinvasive E coli (EIEC) NOT DETECTED NOT DETECTED Final   Cryptosporidium NOT DETECTED NOT DETECTED Final   Cyclospora cayetanensis NOT DETECTED NOT DETECTED Final   Entamoeba histolytica NOT DETECTED NOT DETECTED Final   Giardia lamblia NOT DETECTED NOT DETECTED Final   Adenovirus F40/41 NOT DETECTED NOT DETECTED Final   Astrovirus NOT DETECTED NOT DETECTED Final   Norovirus GI/GII NOT DETECTED NOT DETECTED Final   Rotavirus A NOT DETECTED NOT DETECTED Final   Sapovirus (I, II, IV, and V) NOT DETECTED NOT DETECTED Final    Comment: Performed at [facility], [address] [place], [place] [address]  C difficile quick scan w PCR reflex     Status: None   Collection Time: 04/18/19 10:42 PM  Result Value Ref Range Status   C Diff antigen NEGATIVE NEGATIVE Final   C Diff toxin NEGATIVE NEGATIVE Final   C Diff interpretation No C. difficile detected.  Final    Comment: Performed at [facility], [address] [place], [place] [address]    Coagulation Studies: No results for input(s): LABPROT, INR in the last 72 hours.  Urinalysis: No results for input(s): COLORURINE, LABSPEC, PHURINE, GLUCOSEU, HGBUR, BILIRUBINUR, KETONESUR, PROTEINUR, UROBILINOGEN, NITRITE, LEUKOCYTESUR in the last 72  hours.  Invalid input(s): APPERANCEUR    Imaging: No results found.   Medications:   . sodium chloride Stopped (04/20/19 1419)  . sodium chloride     . aspirin  81 mg Oral Daily  . ciprofloxacin  500 mg Oral Daily  . heparin injection (subcutaneous)  5,000 Units Subcutaneous Q8H  . pantoprazole (PROTONIX) IV  40 mg Intravenous Q12H  . predniSONE  50 mg Oral Q breakfast  . sodium chloride flush  10-40 mL Intracatheter Q12H  . tuberculin  5 Units Intradermal Once  . venlafaxine XR  37.5 mg Oral Q breakfast   sodium chloride, hydrALAZINE, meclizine, ondansetron **OR** ondansetron (ZOFRAN) IV, senna-docusate, sodium chloride flush, sodium chloride flush  Assessment/ Plan:  Ms. Sarah Sullivan is a 73 y.o. Asian female with a PMHx of hypertension, degenerative disc disease, lumbar radiculitis, history of depression, osteopenia, who was admitted to [facility] on 04/14/2019 for hypovolumic shock with E. Coli colitis.   1.  Acute renal failure with metabolic acidosis: secondary to prerenal azotemia, GI losses and blood losses. ATN. Nonoliguric urine output.  Hematuria and proteinuria on admission.  Creatinine baseline of 1, GFR of 56 on 03/27/19. Consistent with chronic kidney disease stage III.  First hemodialysis treatment yesterday. Tolerated treatment well.  Evaluation for HD daily No acute indication today. Monitor UOP Will try furosemide tomorrow  2. LE edema - 3rd spacing and renal failure - monitor UOP    3. Anemia with renal failure: status post 1 unit PRBC 5/26. With thrombocytopenia Appreciate Hematology input - EPO with HD treatment.   4. Colitis:Enteropathogenic E coli (EPEC) - ciprofloxacin.    LOS: 8 Abdinasir Spadafore 6/1/20202:24 PM

## 2019-04-24 NOTE — Progress Notes (Signed)
Pharmacy Electrolyte Monitoring Consult:  Pharmacy consulted to assist in monitoring and replacing electrolytes in this 73 y.o. female admitted on 04/14/2019 with Nausea and Emesis   Labs:  Sodium (mmol/L)  Date Value  04/24/2019 144  12/25/2014 141   Potassium (mmol/L)  Date Value  04/24/2019 3.3 (L)  12/25/2014 3.5   Magnesium (mg/dL)  Date Value  23/55/7322 1.8   Phosphorus (mg/dL)  Date Value  02/54/2706 3.0   Calcium (mg/dL)  Date Value  23/76/2831 8.0 (L)   Calcium, Total (mg/dL)  Date Value  51/76/1607 9.2   Albumin (g/dL)  Date Value  37/08/6268 3.1 (L)    Assessment/Plan: Patient in acute renal failure, started on HD on 5/30  5/31 @1721  K 3.4  - Received KCl po  6/1 @ 0453 K 3.3, Mg 1.8 - Will give KCl po x 1, will hold Mg replenishment at this time and recheck Mag/K with am labs.  Will need to be cautious with supplementation due to renal fucntion.  Pharmacy will continue to monitor and adjust per consult.   Albina Billet, PharmD, BCPS Clinical Pharmacist 04/24/2019 7:34 AM

## 2019-04-24 NOTE — Progress Notes (Addendum)
Patient ID: Sarah Sullivan, female   DOB: 15-Oct-1946, 73 y.o.   MRN: [id number]  [employer] PROGRESS NOTE  Morgane Joerger WUJ:[id number] DOB: 10/29/1946 DOA: 04/14/2019 PCP: Titus Mould, NP  HPI/Subjective: Patient feeling much better.  Denies any nausea or vomiting.  Denies any lightheadedness.   Patient had temporary hemodialysis catheter placement 04/21/2019 nausea but no vomiting.  Abdominal pain is less.    Objective: Vitals:   04/23/19 2014 04/24/19 0452  BP: (!) 173/70 124/78  Pulse: 66 92  Resp: 16 16  Temp: 97.8 F (36.6 C) 98.4 F (36.9 C)  SpO2: 98% 99%    Filed Weights   04/17/19 0301 04/17/19 1246 04/21/19 1532  Weight: 55.1 kg 57.4 kg 57.4 kg    ROS: Review of Systems  Constitutional: Negative for chills and fever.  Eyes: Negative for blurred vision.  Respiratory: Negative for cough and shortness of breath.   Cardiovascular: Negative for chest pain.  Gastrointestinal: Positive for nausea. Negative for abdominal pain, constipation, diarrhea and vomiting.  Genitourinary: Negative for dysuria.  Musculoskeletal: Negative for joint pain.  Neurological: Negative for dizziness and headaches.   Exam: Physical Exam  Constitutional: She is oriented to person, place, and time.  HENT:  Nose: No mucosal edema.  Mouth/Throat: No oropharyngeal exudate or posterior oropharyngeal edema.  Eyes: Pupils are equal, round, and reactive to light. Conjunctivae, EOM and lids are normal.  Neck: No JVD present. Carotid bruit is not present. No edema present. No thyroid mass and no thyromegaly present.  Cardiovascular: S1 normal and S2 normal. Exam reveals no gallop.  No murmur heard. Pulses:      Dorsalis pedis pulses are 2+ on the right side and 2+ on the left side.  Respiratory: No respiratory distress. She has decreased breath sounds in the right lower field and the left lower field. She has no wheezes. She has no rhonchi. She has no rales.  GI: Soft. Bowel sounds  are normal. There is generalized abdominal tenderness.  Left groin with temporary TLC catheter, no active bleeding  Musculoskeletal:     Right ankle: She exhibits no swelling.     Left ankle: She exhibits no swelling.  Lymphadenopathy:    She has no cervical adenopathy.  Neurological: She is alert and oriented to person, place, and time. No cranial nerve deficit.  Skin: Skin is warm. No rash noted. Nails show no clubbing.  Psychiatric: She has a normal mood and affect.      Data Reviewed: Basic Metabolic Panel: Recent Labs  Lab 04/20/19 0450 04/21/19 0352 04/22/19 0245 04/23/19 0414 04/23/19 1721 04/24/19 0453  NA 140 140 140 143  --  144  K 3.3* 3.7 3.1* 2.8* 3.4* 3.3*  CL 101 102 100 102  --  105  CO2 --  29  GLUCOSE 156* 109* 133* 109*  --  88  BUN 66* 65* 72* 53*  --  52*  CREATININE 4.61* 5.04* 5.39* 4.10*  --  4.49*  CALCIUM 6.4* 7.2* 7.4* 7.8*  --  8.0*  MG  --   --   --  1.8  --  1.8   Liver Function Tests: Recent Labs  Lab 04/18/19 0032 04/19/19 0556 04/20/19 0450 04/21/19 0352 04/22/19 0245  AST 485* 204* 133* 113* 146*  ALT 286* 172* 123* 105* 108*  ALKPHOS 321* 423* 573* 663* 672*  BILITOT 1.7* 1.1 1.0 1.0 0.9  PROT 4.4* 3.9* 4.6* 5.1* 5.5*  ALBUMIN 1.8* 1.6* 2.1* 2.5*  3.1*   No results for input(s): LIPASE, AMYLASE in the last 168 hours. CBC: Recent Labs  Lab 04/19/19 0556  04/20/19 0450 04/20/19 1045 04/21/19 0352 04/22/19 0245 04/24/19 0453  WBC 9.2  --  12.4*  --  15.4* 14.3* 10.8*  NEUTROABS 7.1  --   --   --  9.9*  --   --   HGB 9.6*   < > 9.3* 10.0* 9.7* 8.8* 7.4*  HCT 28.4*   < > 26.4* 28.8* 27.3* 25.9* 22.6*  MCV 89.9  --  86.8  --  85.8 89.9 90.4  PLT 95*  --  126*  --  152 151 132*   < > = values in this interval not displayed.   Cardiac Enzymes: No results for input(s): CKTOTAL, CKMB, CKMBINDEX, TROPONINI in the last 168 hours.   Recent Results (from the past 240 hour(s))  MRSA PCR Screening     Status: None    Collection Time: 04/14/19  7:22 PM  Result Value Ref Range Status   MRSA by PCR NEGATIVE NEGATIVE Final    Comment:        The GeneXpert MRSA Assay (FDA approved for NASAL specimens only), is one component of a comprehensive MRSA colonization surveillance program. It is not intended to diagnose MRSA infection nor to guide or monitor treatment for MRSA infections. Performed at [facility], [address] [place], [place] [address]   CULTURE, BLOOD (ROUTINE X 2) w Reflex to ID Panel     Status: None   Collection Time: 04/16/19  7:47 AM  Result Value Ref Range Status   Specimen Description BLOOD BLOOD RIGHT HAND  Final   Special Requests   Final    BOTTLES DRAWN AEROBIC AND ANAEROBIC Blood Culture adequate volume   Culture   Final    NO GROWTH 5 DAYS Performed at [facility], [address] [place], [place] [address]    Report Status 04/21/2019 FINAL  Final  CULTURE, BLOOD (ROUTINE X 2) w Reflex to ID Panel     Status: None   Collection Time: 04/16/19  9:27 AM  Result Value Ref Range Status   Specimen Description BLOOD BLOOD RIGHT HAND  Final   Special Requests   Final    BOTTLES DRAWN AEROBIC ONLY Blood Culture adequate volume   Culture   Final    NO GROWTH 5 DAYS Performed at [facility], [address] [place], [place] [address]    Report Status 04/21/2019 FINAL  Final  MRSA PCR Screening     Status: None   Collection Time: 04/17/19  1:47 PM  Result Value Ref Range Status   MRSA by PCR NEGATIVE NEGATIVE Final    Comment:        The GeneXpert MRSA Assay (FDA approved for NASAL specimens only), is one component of a comprehensive MRSA colonization surveillance program. It is not intended to diagnose MRSA infection nor to guide or monitor treatment for MRSA infections. Performed at [facility], [address] [place], [place] [address]   Gastrointestinal Panel by PCR , Stool     Status: Abnormal   Collection Time:  04/17/19 10:42 PM  Result Value Ref Range Status   Campylobacter species NOT DETECTED NOT DETECTED Final   Plesimonas shigelloides NOT DETECTED NOT DETECTED Final   Salmonella species NOT DETECTED NOT DETECTED Final   Yersinia enterocolitica NOT DETECTED NOT DETECTED Final   Vibrio species NOT DETECTED NOT DETECTED Final   Vibrio  cholerae NOT DETECTED NOT DETECTED Final   Enteroaggregative E coli (EAEC) NOT DETECTED NOT DETECTED Final   Enteropathogenic E coli (EPEC) DETECTED (A) NOT DETECTED Final    Comment: RESULT CALLED TO, READ BACK BY AND VERIFIED WITH: BETH BUONO AT 0211 04/19/2019 SDR    Enterotoxigenic E coli (ETEC) NOT DETECTED NOT DETECTED Final   Shiga like toxin producing E coli (STEC) NOT DETECTED NOT DETECTED Final   Shigella/Enteroinvasive E coli (EIEC) NOT DETECTED NOT DETECTED Final   Cryptosporidium NOT DETECTED NOT DETECTED Final   Cyclospora cayetanensis NOT DETECTED NOT DETECTED Final   Entamoeba histolytica NOT DETECTED NOT DETECTED Final   Giardia lamblia NOT DETECTED NOT DETECTED Final   Adenovirus F40/41 NOT DETECTED NOT DETECTED Final   Astrovirus NOT DETECTED NOT DETECTED Final   Norovirus GI/GII NOT DETECTED NOT DETECTED Final   Rotavirus A NOT DETECTED NOT DETECTED Final   Sapovirus (I, II, IV, and V) NOT DETECTED NOT DETECTED Final    Comment: Performed at [facility], [address] [place], [place] [address]  C difficile quick scan w PCR reflex     Status: None   Collection Time: 04/18/19 10:42 PM  Result Value Ref Range Status   C Diff antigen NEGATIVE NEGATIVE Final   C Diff toxin NEGATIVE NEGATIVE Final   C Diff interpretation No C. difficile detected.  Final    Comment: Performed at [facility], [address] [place], [place] [address]      Scheduled Meds: . aspirin  81 mg Oral Daily  . ciprofloxacin  500 mg Oral Daily  . heparin injection (subcutaneous)  5,000 Units Subcutaneous Q8H  . pantoprazole  40 mg Oral BID  AC  . predniSONE  50 mg Oral Q breakfast  . sodium chloride flush  10-40 mL Intracatheter Q12H  . venlafaxine XR  37.5 mg Oral Q breakfast   Continuous Infusions: . sodium chloride Stopped (04/20/19 1419)  . sodium chloride      Assessment/Plan:  1. Acute kidney injury.  Likely secondary to hypovolemic shock and GI losses.  Case discussed with nephrology and temporary dialysis catheter placed on 04/21/2019 groin.  Patient had hemodialysis on 04/22/2019.  HD per Nephrology 2. hypo-kalemia replete and recheck in a.m. check magnesium also 3. hypovolemic shock.  Resolved holding antihypertensive medications.  Solu-Cortef to 25 mg twice a day. change to p.o. prednisone which might improve her nausea 4. 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  Continue Cipro.  GI is following no new recommendations at this time 5. Acute blood loss anemia.  Patient responded to 1 unit of blood.  Hemoglobin stable over the last few days.  Hemoglobin at 8.8 6. Atrial fibrillation with rapid ventricular response converted over to normal sinus rhythm with amiodarone.  Now off amiodarone.  Patient is been in sinus bradycardia at this point. 7. Thrombocytopenia continue to monitor blood counts.-Secondary to severe sepsis resolved smear does show some red blood cell fragments.  Platelet count back to normal  8. Elevated liver function test.  Likely with hypotension.  Continue to monitor.  Hepatitis panel negative.  Ultrasound concerning for early cirrhosis even though CAT scan did not comment on this. 9. LE edema- third spacing, albumin 3.1, will supplement with nepro , dietary consult 10. DVT prophylaxis with Lovenox renal dose adjusted  Code Status:     Code Status Orders  (From admission, onward)         Start     Ordered  04/14/19 1533  Full code  Continuous     04/14/19 1532        Code Status History    This patient has a current code status but no historical code status.      Family Communication: Call placed to husband for update Disposition Plan: To be determined  Consultants:  Critical care specialist  Gastroenterology  Nephrology  Oncology  Time spent: 33 minutes.  Spoke with nephrology  [facility]  [employer]

## 2019-04-24 NOTE — Progress Notes (Signed)
Sarah Repressohini R Eual Lindstrom, MD [address]  [address]  [place], [place] [address]  Main: [phone]  Fax: [phone] Pager: [phone]   Subjective: Patient reports having black stool today.  She denies abdominal pain, nausea or vomiting.  She says she received stool softeners that resulted in a bowel movement.  She ate salad today.  Her hemoglobin dropped to 7.4 from 8.8 within last 2 days   Objective: Vital signs in last 24 hours: Vitals:   04/23/19 1600 04/23/19 2014 04/24/19 0452 04/24/19 1600  BP: (!) 112/94 (!) 173/70 124/78 (!) 147/61  Pulse: 70 66 92 73  Resp:  16 16 20   Temp:  97.8 F (36.6 C) 98.4 F (36.9 C) 98.4 F (36.9 C)  TempSrc:  Oral Oral Oral  SpO2:  98% 99% 96%  Weight:      Height:       Weight change:   Intake/Output Summary (Last 24 hours) at 04/24/2019 1752 Last data filed at 04/24/2019 0900 Gross per 24 hour  Intake 246 ml  Output 651 ml  Net -405 ml     Exam: Heart:: Regular rate and rhythm or S1S2 present Lungs: normal and clear to auscultation Abdomen: soft, nontender, normal bowel sounds   Lab Results: CBC Latest Ref Rng & Units 04/24/2019 04/22/2019 04/21/2019  WBC 4.0 - 10.5 K/uL 10.8(H) 14.3(H) 15.4(H)  Hemoglobin 12.0 - 15.0 g/dL 7.4(L) 8.8(L) 9.7(L)  Hematocrit 36.0 - 46.0 % 22.6(L) 25.9(L) 27.3(L)  Platelets 150 - 400 K/uL 132(L) 151 152   CMP Latest Ref Rng & Units 04/24/2019 04/23/2019 04/23/2019  Glucose 70 - 99 mg/dL 88 - 027(O109(H)  BUN 8 - 23 mg/dL 53(G52(H) - 64(Q53(H)  Creatinine 0.44 - 1.00 mg/dL 0.34(V4.49(H) - 4.25(Z4.10(H)  Sodium 135 - 145 mmol/L 144 - 143  Potassium 3.5 - 5.1 mmol/L 3.3(L) 3.4(L) 2.8(L)  Chloride 98 - 111 mmol/L 105 - 102  CO2 22 - 32 mmol/L 29 - 28  Calcium 8.9 - 10.3 mg/dL 8.0(L) - 7.8(L)  Total Protein 6.5 - 8.1 g/dL - - -  Total Bilirubin 0.3 - 1.2 mg/dL - - -  Alkaline Phos 38 - 126 U/L - - -  AST 15 - 41 U/L - - -  ALT 0 - 44 U/L - - -    Micro Results: Recent Results (from the past 240 hour(s))  MRSA PCR  Screening     Status: None   Collection Time: 04/14/19  7:22 PM  Result Value Ref Range Status   MRSA by PCR NEGATIVE NEGATIVE Final    Comment:        The GeneXpert MRSA Assay (FDA approved for NASAL specimens only), is one component of a comprehensive MRSA colonization surveillance program. It is not intended to diagnose MRSA infection nor to guide or monitor treatment for MRSA infections. Performed at [facility], [address] [place], [place] [address]   CULTURE, BLOOD (ROUTINE X 2) w Reflex to ID Panel     Status: None   Collection Time: 04/16/19  7:47 AM  Result Value Ref Range Status   Specimen Description BLOOD BLOOD RIGHT HAND  Final   Special Requests   Final    BOTTLES DRAWN AEROBIC AND ANAEROBIC Blood Culture adequate volume   Culture   Final    NO GROWTH 5 DAYS Performed at [facility], [address] [place], [place] [address]    Report Status 04/21/2019 FINAL  Final  CULTURE, BLOOD (ROUTINE X 2) w  Reflex to ID Panel     Status: None   Collection Time: 04/16/19  9:27 AM  Result Value Ref Range Status   Specimen Description BLOOD BLOOD RIGHT HAND  Final   Special Requests   Final    BOTTLES DRAWN AEROBIC ONLY Blood Culture adequate volume   Culture   Final    NO GROWTH 5 DAYS Performed at [facility], [address] [place], [place] [address]    Report Status 04/21/2019 FINAL  Final  MRSA PCR Screening     Status: None   Collection Time: 04/17/19  1:47 PM  Result Value Ref Range Status   MRSA by PCR NEGATIVE NEGATIVE Final    Comment:        The GeneXpert MRSA Assay (FDA approved for NASAL specimens only), is one component of a comprehensive MRSA colonization surveillance program. It is not intended to diagnose MRSA infection nor to guide or monitor treatment for MRSA infections. Performed at [facility], [address] [place], [place] [address]   Gastrointestinal Panel by PCR , Stool     Status:  Abnormal   Collection Time: 04/17/19 10:42 PM  Result Value Ref Range Status   Campylobacter species NOT DETECTED NOT DETECTED Final   Plesimonas shigelloides NOT DETECTED NOT DETECTED Final   Salmonella species NOT DETECTED NOT DETECTED Final   Yersinia enterocolitica NOT DETECTED NOT DETECTED Final   Vibrio species NOT DETECTED NOT DETECTED Final   Vibrio cholerae NOT DETECTED NOT DETECTED Final   Enteroaggregative E coli (EAEC) NOT DETECTED NOT DETECTED Final   Enteropathogenic E coli (EPEC) DETECTED (A) NOT DETECTED Final    Comment: RESULT CALLED TO, READ BACK BY AND VERIFIED WITH: BETH BUONO AT 0211 04/19/2019 SDR    Enterotoxigenic E coli (ETEC) NOT DETECTED NOT DETECTED Final   Shiga like toxin producing E coli (STEC) NOT DETECTED NOT DETECTED Final   Shigella/Enteroinvasive E coli (EIEC) NOT DETECTED NOT DETECTED Final   Cryptosporidium NOT DETECTED NOT DETECTED Final   Cyclospora cayetanensis NOT DETECTED NOT DETECTED Final   Entamoeba histolytica NOT DETECTED NOT DETECTED Final   Giardia lamblia NOT DETECTED NOT DETECTED Final   Adenovirus F40/41 NOT DETECTED NOT DETECTED Final   Astrovirus NOT DETECTED NOT DETECTED Final   Norovirus GI/GII NOT DETECTED NOT DETECTED Final   Rotavirus A NOT DETECTED NOT DETECTED Final   Sapovirus (I, II, IV, and V) NOT DETECTED NOT DETECTED Final    Comment: Performed at [facility], [address] [place], [place] [address]  C difficile quick scan w PCR reflex     Status: None   Collection Time: 04/18/19 10:42 PM  Result Value Ref Range Status   C Diff antigen NEGATIVE NEGATIVE Final   C Diff toxin NEGATIVE NEGATIVE Final   C Diff interpretation No C. difficile detected.  Final    Comment: Performed at [facility], [address] [place], [place] [address]   Studies/Results: No results found. Medications:  I have reviewed the patient's current medications. Prior to Admission:  Medications Prior to Admission   Medication Sig Dispense Refill Last Dose  . aspirin 81 MG chewable tablet Chew 81 mg by mouth daily.   unknown at unknown  . atorvastatin (LIPITOR) 80 MG tablet Take 80 mg by mouth daily.   unknown at unknown  . chlorthalidone (HYGROTON) 25 MG tablet Take 25 mg by mouth daily.   unknown at unknown  . desvenlafaxine (PRISTIQ) 50 MG 24 hr  tablet Take 50 mg by mouth daily.   unknown at unknown  . felodipine (PLENDIL) 10 MG 24 hr tablet Take 10 mg by mouth daily.   unknown at unknown  . losartan (COZAAR) 50 MG tablet Take 50 mg by mouth daily.   unknown at unknown  . meloxicam (MOBIC) 15 MG tablet Take 15 mg by mouth daily as needed for pain.    unknown at unknown  . simvastatin (ZOCOR) 20 MG tablet Take 20 mg by mouth daily.      Scheduled: . aspirin  81 mg Oral Daily  . ciprofloxacin  500 mg Oral Daily  . feeding supplement (NEPRO CARB STEADY)  237 mL Oral TID BM  . heparin injection (subcutaneous)  5,000 Units Subcutaneous Q8H  . octreotide  50 mcg Intravenous Once  . pantoprazole (PROTONIX) IV  40 mg Intravenous Q12H  . predniSONE  50 mg Oral Q breakfast  . sodium chloride flush  10-40 mL Intracatheter Q12H  . venlafaxine XR  37.5 mg Oral Q breakfast   Continuous: . sodium chloride Stopped (04/20/19 1419)  . octreotide  (SANDOSTATIN)    IV infusion    . sodium chloride     NWG:NFAOZH chloride, hydrALAZINE, meclizine, ondansetron **OR** ondansetron (ZOFRAN) IV, senna-docusate, sodium chloride flush, sodium chloride flush Anti-infectives (From admission, onward)   Start     Dose/Rate Route Frequency Ordered Stop   04/21/19 1000  ciprofloxacin (CIPRO) tablet 500 mg     500 mg Oral Daily 04/20/19 0952     04/19/19 1500  ciprofloxacin (CIPRO) tablet 500 mg  Status:  Discontinued     500 mg Oral 2 times daily 04/19/19 1407 04/20/19 0952   04/19/19 1400  azithromycin (ZITHROMAX) tablet 500 mg  Status:  Discontinued     500 mg Oral Daily 04/19/19 1353 04/19/19 1406   04/17/19 2300   cefTRIAXone (ROCEPHIN) 2 g in sodium chloride 0.9 % 100 mL IVPB  Status:  Discontinued     2 g 200 mL/hr over 30 Minutes Intravenous Every 24 hours 04/17/19 2255 04/19/19 1410   04/17/19 2300  metroNIDAZOLE (FLAGYL) IVPB 500 mg  Status:  Discontinued     500 mg 100 mL/hr over 60 Minutes Intravenous Every 8 hours 04/17/19 2255 04/19/19 1410   04/15/19 1315  piperacillin-tazobactam (ZOSYN) IVPB 3.375 g  Status:  Discontinued     3.375 g 12.5 mL/hr over 240 Minutes Intravenous Every 12 hours 04/15/19 1312 04/17/19 1820     Scheduled Meds: . aspirin  81 mg Oral Daily  . ciprofloxacin  500 mg Oral Daily  . feeding supplement (NEPRO CARB STEADY)  237 mL Oral TID BM  . heparin injection (subcutaneous)  5,000 Units Subcutaneous Q8H  . pantoprazole  40 mg Oral BID AC  . predniSONE  50 mg Oral Q breakfast  . sodium chloride flush  10-40 mL Intracatheter Q12H  . venlafaxine XR  37.5 mg Oral Q breakfast   Continuous Infusions: . sodium chloride Stopped (04/20/19 1419)  . sodium chloride     PRN Meds:.sodium chloride, hydrALAZINE, meclizine, ondansetron **OR** ondansetron (ZOFRAN) IV, senna-docusate, sodium chloride flush, sodium chloride flush   Assessment: Active Problems:   Hypokalemia   Colitis   Dehydration   Thrombocytopenia (HCC)   Acute renal failure (HCC)   Abnormal LFTs   Anemia due to chronic blood loss  Black stools today, worsening anemia Ultrasound abdomen revealed nodularity of the liver, along with thrombocytopenia raises concern for cirrhosis  Plan: Recommend IV Protonix 40  mg twice daily Start octreotide drip On ciprofloxacin daily N.p.o. past midnight Plan for EGD tomorrow  Elevated LFTs Hepatitis A, B, C are negative Other viral serologies are pending Also recommend secondary liver disease work-up  Will follow along with you    LOS: 8 days   Renso Swett 04/24/2019, 5:52 PM

## 2019-04-24 NOTE — Care Management Important Message (Signed)
Important Message  Patient Details  Name: Sarah [place] MRN: [id number] Date of Birth: 04/23/1946   Medicare Important Message Given:  Yes    Olegario Messier A Hermes Wafer 04/24/2019, 12:12 PM

## 2019-04-25 ENCOUNTER — Encounter: Payer: Self-pay | Admitting: Anesthesiology

## 2019-04-25 ENCOUNTER — Encounter
Admission: EM | Disposition: A | Discharge status: Home Health | Payer: Self-pay | Source: Home / Self Care | Attending: Internal Medicine

## 2019-04-25 ENCOUNTER — Inpatient Hospital Stay: Payer: Medicare Other | Admitting: Registered Nurse

## 2019-04-25 DIAGNOSIS — Z538 Procedure and treatment not carried out for other reasons: Secondary | ICD-10-CM

## 2019-04-25 DIAGNOSIS — K921 Melena: Secondary | ICD-10-CM

## 2019-04-25 HISTORY — PX: ESOPHAGOGASTRODUODENOSCOPY: SHX5428

## 2019-04-25 LAB — CBC
HCT: 22.3 % — ABNORMAL LOW (ref 36.0–46.0)
Hemoglobin: 7.3 g/dL — ABNORMAL LOW (ref 12.0–15.0)
MCH: 30.5 pg (ref 26.0–34.0)
MCHC: 32.7 g/dL (ref 30.0–36.0)
MCV: 93.3 fL (ref 80.0–100.0)
Platelets: 119 10*3/uL — ABNORMAL LOW (ref 150–400)
RBC: 2.39 MIL/uL — ABNORMAL LOW (ref 3.87–5.11)
RDW: 15.9 % — ABNORMAL HIGH (ref 11.5–15.5)
WBC: 7.6 10*3/uL (ref 4.0–10.5)
nRBC: 0 % (ref 0.0–0.2)

## 2019-04-25 LAB — HEMOGLOBIN AND HEMATOCRIT, BLOOD
HCT: 23 % — ABNORMAL LOW (ref 36.0–46.0)
HCT: 22.5 % — ABNORMAL LOW (ref 36.0–46.0)
Hemoglobin: 7.6 g/dL — ABNORMAL LOW (ref 12.0–15.0)
Hemoglobin: 7.4 g/dL — ABNORMAL LOW (ref 12.0–15.0)

## 2019-04-25 LAB — BASIC METABOLIC PANEL
Anion gap: 10 (ref 5–15)
BUN: 58 mg/dL — ABNORMAL HIGH (ref 8–23)
CO2: 27 mmol/L (ref 22–32)
Calcium: 8.1 mg/dL — ABNORMAL LOW (ref 8.9–10.3)
Chloride: 107 mmol/L (ref 98–111)
Creatinine, Ser: 4.25 mg/dL — ABNORMAL HIGH (ref 0.44–1.00)
GFR calc Af Amer: 11 mL/min — ABNORMAL LOW (ref >60)
GFR calc non Af Amer: 10 mL/min — ABNORMAL LOW (ref >60)
Glucose, Bld: 143 mg/dL — ABNORMAL HIGH (ref 70–99)
Potassium: 3.8 mmol/L (ref 3.5–5.1)
Sodium: 144 mmol/L (ref 135–145)

## 2019-04-25 LAB — CMV DNA, QUANTITATIVE, PCR
CMV DNA Quant: NEGATIVE IU/mL
Log10 CMV Qn DNA Pl: UNDETERMINED log10 IU/mL

## 2019-04-25 LAB — FERRITIN: Ferritin: 560 ng/mL — ABNORMAL HIGH (ref 11–307)

## 2019-04-25 LAB — MAGNESIUM: Magnesium: 1.8 mg/dL (ref 1.7–2.4)

## 2019-04-25 SURGERY — EGD (ESOPHAGOGASTRODUODENOSCOPY)
Anesthesia: General

## 2019-04-25 MED ORDER — PROPOFOL 10 MG/ML IV BOLUS
INTRAVENOUS | Status: DC | PRN
  Administered 2019-04-25: 80 mg via INTRAVENOUS

## 2019-04-25 MED ORDER — PROPOFOL 500 MG/50ML IV EMUL
INTRAVENOUS | Status: DC | PRN
  Administered 2019-04-25: 150 ug/kg/min via INTRAVENOUS

## 2019-04-25 MED ORDER — SODIUM CHLORIDE 0.9 % IV SOLN
INTRAVENOUS | Status: DC
  Administered 2019-04-25: 11:00:00 1000 mL via INTRAVENOUS

## 2019-04-25 MED ORDER — MAGNESIUM CITRATE PO SOLN
1.0000 | Freq: Once | ORAL | Status: AC
  Administered 2019-04-25: 16:00:00 1 via ORAL
  Filled 2019-04-25: qty 296

## 2019-04-25 MED ORDER — FUROSEMIDE 10 MG/ML IJ SOLN
40.0000 mg | Freq: Once | INTRAMUSCULAR | Status: AC
  Administered 2019-04-25: 40 mg via INTRAVENOUS
  Filled 2019-04-25: qty 4

## 2019-04-25 MED ORDER — LIDOCAINE HCL (CARDIAC) PF 100 MG/5ML IV SOSY
PREFILLED_SYRINGE | INTRAVENOUS | Status: DC | PRN
  Administered 2019-04-25: 40 mg via INTRAVENOUS

## 2019-04-25 MED ORDER — RENA-VITE PO TABS
1.0000 | ORAL_TABLET | Freq: Every day | ORAL | Status: DC
  Administered 2019-04-26 – 2019-04-29 (×4): 1 via ORAL
  Filled 2019-04-25 (×6): qty 1

## 2019-04-25 MED ORDER — PEG 3350-KCL-NA BICARB-NACL 420 G PO SOLR
4000.0000 mL | Freq: Once | ORAL | Status: AC
  Administered 2019-04-25: 20:00:00 4000 mL via ORAL
  Filled 2019-04-25: qty 4000

## 2019-04-25 MED ORDER — BOOST / RESOURCE BREEZE PO LIQD CUSTOM
1.0000 | Freq: Three times a day (TID) | ORAL | Status: DC
  Administered 2019-04-25 – 2019-04-28 (×4): 1 via ORAL

## 2019-04-25 MED ORDER — METOCLOPRAMIDE HCL 5 MG PO TABS
10.0000 mg | ORAL_TABLET | Freq: Once | ORAL | Status: AC
  Administered 2019-04-25: 14:00:00 10 mg via ORAL
  Filled 2019-04-25: qty 2

## 2019-04-25 MED ORDER — PROPOFOL 10 MG/ML IV BOLUS
INTRAVENOUS | Status: AC
  Filled 2019-04-25: qty 20

## 2019-04-25 NOTE — Op Note (Signed)
[facility] Patient Name: Sarah Sullivan Procedure Date: 04/25/2019 10:48 AM MRN: [id number] Account #: [id number] Date of Birth: [birth year] Admit Type: Outpatient Age: 73 Room: [facility] ENDO ROOM 3 Gender: Female Note Status: Finalized Procedure:            Upper GI endoscopy Indications:          Melena Providers:            Lin Landsman MD, MD Referring MD:         Dani Gobble. Dema Severin, MD (Referring MD) Medicines:            Monitored Anesthesia Care Complications:        No immediate complications. Estimated blood loss: None. Procedure:            Pre-Anesthesia Assessment:                       - Prior to the procedure, a History and Physical was                        performed, and patient medications and allergies were                        reviewed. The patient is competent. The risks and                        benefits of the procedure and the sedation options and                        risks were discussed with the patient. All questions                        were answered and informed consent was obtained.                        Patient identification and proposed procedure were                        verified by the physician, the nurse, the                        anesthesiologist, the anesthetist and the technician in                        the pre-procedure area in the procedure room in the                        endoscopy suite. Mental Status Examination: alert and                        oriented. Airway Examination: normal oropharyngeal                        airway and neck mobility. Respiratory Examination:                        clear to auscultation. CV Examination: normal.                        Prophylactic Antibiotics: The patient does not require  prophylactic antibiotics. Prior Anticoagulants: The                        patient has taken no previous anticoagulant or   antiplatelet agents. ASA Grade Assessment: III - A                        patient with severe systemic disease. After reviewing                        the risks and benefits, the patient was deemed in                        satisfactory condition to undergo the procedure. The                        anesthesia plan was to use monitored anesthesia care                        (MAC). Immediately prior to administration of                        medications, the patient was re-assessed for adequacy                        to receive sedatives. The heart rate, respiratory rate,                        oxygen saturations, blood pressure, adequacy of                        pulmonary ventilation, and response to care were                        monitored throughout the procedure. The physical status                        of the patient was re-assessed after the procedure.                       After obtaining informed consent, the endoscope was                        passed under direct vision. Throughout the procedure,                        the patient's blood pressure, pulse, and oxygen                        saturations were monitored continuously. The Endoscope                        was introduced through the mouth, with the intention of                        advancing to the stomach. The scope was advanced to the                        gastric body before the procedure was aborted.  Medications were given. The patient tolerated the                        procedure well. The procedure was aborted due to                        presence of food. Findings:      A large amount of food (residue) was found in the gastric body.      The gastroesophageal junction and examined esophagus were normal. Impression:           - The procedure was aborted due to presence of food.                       - A large amount of food (residue) in the stomach.                       - Normal  gastroesophageal junction and esophagus.                       - No specimens collected. Recommendation:       - Return patient to hospital ward for ongoing care.                       - Clear liquid diet today.                       - Continue present medications.                       - Perform a colonoscopy tomorrow. Procedure Code(s):    --- Professional ---                       [phone], 52, Esophagogastroduodenoscopy, flexible,                        transoral; diagnostic, including collection of                        specimen(s) by brushing or washing, when performed                        (separate procedure) Diagnosis Code(s):    --- Professional ---                       Z53.8, Procedure and treatment not carried out for                        other reasons                       K92.1, Melena (includes Hematochezia) CPT copyright 2019 American Medical Association. All rights reserved. The codes documented in this report are preliminary and upon coder review may  be revised to meet current compliance requirements. Dr. Ulyess Mort Lin Landsman MD, MD 04/25/2019 11:00:59 AM This report has been signed electronically. Number of Addenda: 0 Note Initiated On: 04/25/2019 10:48 AM      [facility]

## 2019-04-25 NOTE — TOC Progression Note (Signed)
Transition of Care [facility]) - Progression Note    Patient Details  Name: Kecha Liddiard MRN: [id number] Date of Birth: 1946-10-11  Transition of Care [facility]) CM/SW Contact  Barrie Dunker, RN Phone Number: 04/25/2019, 9:19 AM  Clinical Narrative:     Patient had a temporary Dialysis cath placed on 05/29 and had hemodialysis on 05/30, received 1 unit of blood, hgb dropped to 7.4 GI consult done Patient is set up to have [facility] PT with [facility] once DC needs a RW before DC, CM to make sure order is in  CM to continue to monitor for needs  Expected Discharge Plan: Home w Home Health Services Barriers to Discharge: Continued Medical Work up  Expected Discharge Plan and Services Expected Discharge Plan: Home w Home Health Services   Discharge Planning Services: CM Consult Post Acute Care Choice: Durable Medical Equipment, Home Health Living arrangements for the past 2 months: Single Family Home Expected Discharge Date: 04/16/19               DME Arranged: Dan Humphreys rolling DME Agency: [employer] Date DME Agency Contacted: 04/20/19 Time DME Agency Contacted: 1127 Representative spoke with at DME Agency: Lisa Roca HH Arranged: PT HH Agency: [facility] Date HH Agency Contacted: 04/20/19 Time HH Agency Contacted: [phone] Representative spoke with at [facility] Agency: Feliberto Gottron   Social Determinants of Health (SDOH) Interventions    Readmission Risk Interventions No flowsheet data found.

## 2019-04-25 NOTE — Transfer of Care (Signed)
Immediate Anesthesia Transfer of Care Note  Patient: Nayanna Karam  Procedure(s) Performed: Procedure(s): ESOPHAGOGASTRODUODENOSCOPY (EGD) (N/A)  Patient Location: PACU and Endoscopy Unit  Anesthesia Type:General  Level of Consciousness: sedated  Airway & Oxygen Therapy: Patient Spontanous Breathing and Patient connected to nasal cannula oxygen  Post-op Assessment: Report given to RN and Post -op Vital signs reviewed and stable  Post vital signs: Reviewed and stable  Last Vitals:  Vitals:   04/25/19 1032 04/25/19 1110  BP: (!) 182/81 (!) 158/67  Pulse: 73 65  Resp: 16 18  Temp: (!) 36.4 C (!) 36.2 C  SpO2: 92% 99%    Complications: No apparent anesthesia complications

## 2019-04-25 NOTE — Anesthesia Preprocedure Evaluation (Signed)
Anesthesia Evaluation  Patient identified by MRN, date of birth, ID band Patient awake    Reviewed: Allergy & Precautions, H&P , NPO status , Patient's Chart, lab work & pertinent test results, reviewed documented beta blocker date and time   History of Anesthesia Complications Negative for: history of anesthetic complications  Airway Mallampati: III  TM Distance: >3 FB Neck ROM: full    Dental  (+) Dental Advidsory Given, Poor Dentition, Missing, Chipped   Pulmonary neg pulmonary ROS,           Cardiovascular Exercise Tolerance: Good hypertension, (-) angina(-) Past MI (-) dysrhythmias (-) Valvular Problems/Murmurs     Neuro/Psych negative neurological ROS  negative psych ROS   GI/Hepatic negative GI ROS, Neg liver ROS,   Endo/Other  negative endocrine ROS  Renal/GU Renal disease  negative genitourinary   Musculoskeletal   Abdominal   Peds  Hematology  (+) Blood dyscrasia, anemia ,   Anesthesia Other Findings Past Medical History: No date: Hypertension   Reproductive/Obstetrics negative OB ROS                             Anesthesia Physical Anesthesia Plan  ASA: III  Anesthesia Plan: General   Post-op Pain Management:    Induction: Intravenous  PONV Risk Score and Plan: 3 and Propofol infusion and TIVA  Airway Management Planned: Nasal Cannula and Natural Airway  Additional Equipment:   Intra-op Plan:   Post-operative Plan:   Informed Consent: I have reviewed the patients History and Physical, chart, labs and discussed the procedure including the risks, benefits and alternatives for the proposed anesthesia with the patient or authorized representative who has indicated his/her understanding and acceptance.     Dental Advisory Given  Plan Discussed with: Anesthesiologist, CRNA and Surgeon  Anesthesia Plan Comments:         Anesthesia Quick Evaluation

## 2019-04-25 NOTE — Progress Notes (Signed)
May sit in chair with femoral cathter

## 2019-04-25 NOTE — Progress Notes (Signed)
Pharmacy Electrolyte Monitoring Consult:  Pharmacy consulted to assist in monitoring and replacing electrolytes in this 73 y.o. female admitted on 04/14/2019 with Nausea and Emesis   Labs:  Sodium (mmol/L)  Date Value  04/25/2019 144  12/25/2014 141   Potassium (mmol/L)  Date Value  04/25/2019 3.8  12/25/2014 3.5   Magnesium (mg/dL)  Date Value  00/51/1021 1.8   Phosphorus (mg/dL)  Date Value  11/73/5670 3.0   Calcium (mg/dL)  Date Value  14/08/3012 8.1 (L)   Calcium, Total (mg/dL)  Date Value  14/38/8875 9.2   Albumin (g/dL)  Date Value  79/72/8206 3.1 (L)    Assessment/Plan: Patient in acute renal failure, started on HD on 5/30  5/31 @1721  K 3.4  - Received KCl po 6/1 @ 0453 K 3.3, Mg 1.8  6/2 @ 0359 K 3.8, Mg 1.8  - Will hold Mg/K replenishment at this time and recheck K with am labs.  Will need to be cautious with supplementation due to renal fucntion.  Pharmacy will continue to monitor and adjust per consult.   Albina Billet, PharmD, BCPS Clinical Pharmacist 04/25/2019 7:02 AM

## 2019-04-25 NOTE — Progress Notes (Signed)
Patient ID: Sarah Sullivan, female   DOB: 1946/04/17, 72 y.o.   MRN: [id number]  [employer] PROGRESS NOTE  Sarah Sullivan SFS:[id number] DOB: [birth year] DOA: 04/14/2019 PCP: Sarah Mould, NP  HPI/Subjective: Patient with blood in her stool started from yesterday night.  Denies any nausea or vomiting.  Feels swollen in her lower extremities patient had temporary hemodialysis catheter placement 04/21/2019 nausea but no vomiting.  Abdominal pain is less.    Objective: Vitals:   04/25/19 1110 04/25/19 1416  BP: (!) 158/67 (!) 160/70  Pulse: 65 88  Resp: 18 16  Temp: (!) 97.2 F (36.2 C) 98.7 F (37.1 C)  SpO2: 99% 94%    Filed Weights   04/17/19 0301 04/17/19 1246 04/21/19 1532  Weight: 55.1 kg 57.4 kg 57.4 kg    ROS: Review of Systems  Constitutional: Negative for chills and fever.  Eyes: Negative for blurred vision.  Respiratory: Negative for cough and shortness of breath.   Cardiovascular: Negative for chest pain.  Gastrointestinal: Positive for nausea. Negative for abdominal pain, constipation, diarrhea and vomiting.  Genitourinary: Negative for dysuria.  Musculoskeletal: Negative for joint pain.  Neurological: Negative for dizziness and headaches.   Exam: Physical Exam  Constitutional: She is oriented to person, place, and time.  HENT:  Nose: No mucosal edema.  Mouth/Throat: No oropharyngeal exudate or posterior oropharyngeal edema.  Eyes: Pupils are equal, round, and reactive to light. Conjunctivae, EOM and lids are normal.  Neck: No JVD present. Carotid bruit is not present. No edema present. No thyroid mass and no thyromegaly present.  Cardiovascular: S1 normal and S2 normal. Exam reveals no gallop.  No murmur heard. Pulses:      Dorsalis pedis pulses are 2+ on the right side and 2+ on the left side.  Respiratory: No respiratory distress. She has decreased breath sounds in the right lower field and the left lower field. She has no wheezes. She has no  rhonchi. She has no rales.  GI: Soft. Bowel sounds are normal. There is generalized abdominal tenderness.  Left groin with temporary TLC catheter, no active bleeding  Musculoskeletal:     Right ankle: She exhibits no swelling.     Left ankle: She exhibits no swelling.  Lymphadenopathy:    She has no cervical adenopathy.  Neurological: She is alert and oriented to person, place, and time. No cranial nerve deficit.  Skin: Skin is warm. No rash noted. Nails show no clubbing.  Psychiatric: She has a normal mood and affect.      Data Reviewed: Basic Metabolic Panel: Recent Labs  Lab 04/21/19 0352 04/22/19 0245 04/23/19 0414 04/23/19 1721 04/24/19 0453 04/25/19 0359  NA 140 140 143  --  144 144  K 3.7 3.1* 2.8* 3.4* 3.3* 3.8  CL 102 100 102  --  105 107  CO2 25 28 28   --  29 27  GLUCOSE 109* 133* 109*  --  88 143*  BUN 65* 72* 53*  --  52* 58*  CREATININE 5.04* 5.39* 4.10*  --  4.49* 4.25*  CALCIUM 7.2* 7.4* 7.8*  --  8.0* 8.1*  MG  --   --  1.8  --  1.8 1.8   Liver Function Tests: Recent Labs  Lab 04/19/19 0556 04/20/19 0450 04/21/19 0352 04/22/19 0245  AST 204* 133* 113* 146*  ALT 172* 123* 105* 108*  ALKPHOS 423* 573* 663* 672*  BILITOT 1.1 1.0 1.0 0.9  PROT 3.9* 4.6* 5.1* 5.5*  ALBUMIN 1.6* 2.1* 2.5* 3.1*  No results for input(s): LIPASE, AMYLASE in the last 168 hours. CBC: Recent Labs  Lab 04/19/19 0556  04/21/19 0352 04/22/19 0245 04/24/19 0453 04/24/19 1913 04/25/19 0359  WBC 9.2   < > 15.4* 14.3* 10.8* 7.4 7.6  NEUTROABS 7.1  --  9.9*  --   --   --   --   HGB 9.6*   < > 9.7* 8.8* 7.4* 8.2* 7.3*  HCT 28.4*   < > 27.3* 25.9* 22.6* 24.7* 22.3*  MCV 89.9   < > 85.8 89.9 90.4 92.5 93.3  PLT 95*   < > 152 151 132* 126* 119*   < > = values in this interval not displayed.   Cardiac Enzymes: No results for input(s): CKTOTAL, CKMB, CKMBINDEX, TROPONINI in the last 168 hours.   Recent Results (from the past 240 hour(s))  CULTURE, BLOOD (ROUTINE X 2) w  Reflex to ID Panel     Status: None   Collection Time: 04/16/19  7:47 AM  Result Value Ref Range Status   Specimen Description BLOOD BLOOD RIGHT HAND  Final   Special Requests   Final    BOTTLES DRAWN AEROBIC AND ANAEROBIC Blood Culture adequate volume   Culture   Final    NO GROWTH 5 DAYS Performed at [facility], [address] [place], [place] [address]    Report Status 04/21/2019 FINAL  Final  CULTURE, BLOOD (ROUTINE X 2) w Reflex to ID Panel     Status: None   Collection Time: 04/16/19  9:27 AM  Result Value Ref Range Status   Specimen Description BLOOD BLOOD RIGHT HAND  Final   Special Requests   Final    BOTTLES DRAWN AEROBIC ONLY Blood Culture adequate volume   Culture   Final    NO GROWTH 5 DAYS Performed at [facility], [address] [place], [place] [address]    Report Status 04/21/2019 FINAL  Final  MRSA PCR Screening     Status: None   Collection Time: 04/17/19  1:47 PM  Result Value Ref Range Status   MRSA by PCR NEGATIVE NEGATIVE Final    Comment:        The GeneXpert MRSA Assay (FDA approved for NASAL specimens only), is one component of a comprehensive MRSA colonization surveillance program. It is not intended to diagnose MRSA infection nor to guide or monitor treatment for MRSA infections. Performed at [facility], [address] [place], [place] [address]   Gastrointestinal Panel by PCR , Stool     Status: Abnormal   Collection Time: 04/17/19 10:42 PM  Result Value Ref Range Status   Campylobacter species NOT DETECTED NOT DETECTED Final   Plesimonas shigelloides NOT DETECTED NOT DETECTED Final   Salmonella species NOT DETECTED NOT DETECTED Final   Yersinia enterocolitica NOT DETECTED NOT DETECTED Final   Vibrio species NOT DETECTED NOT DETECTED Final   Vibrio cholerae NOT DETECTED NOT DETECTED Final   Enteroaggregative E coli (EAEC) NOT DETECTED NOT DETECTED Final   Enteropathogenic E coli (EPEC) DETECTED (A) NOT  DETECTED Final    Comment: RESULT CALLED TO, READ BACK BY AND VERIFIED WITH: BETH BUONO AT 0211 04/19/2019 SDR    Enterotoxigenic E coli (ETEC) NOT DETECTED NOT DETECTED Final   Shiga like toxin producing E coli (STEC) NOT DETECTED NOT DETECTED Final   Shigella/Enteroinvasive E coli (EIEC) NOT DETECTED NOT DETECTED Final   Cryptosporidium NOT DETECTED NOT DETECTED Final   Cyclospora cayetanensis NOT DETECTED NOT  DETECTED Final   Entamoeba histolytica NOT DETECTED NOT DETECTED Final   Giardia lamblia NOT DETECTED NOT DETECTED Final   Adenovirus F40/41 NOT DETECTED NOT DETECTED Final   Astrovirus NOT DETECTED NOT DETECTED Final   Norovirus GI/GII NOT DETECTED NOT DETECTED Final   Rotavirus A NOT DETECTED NOT DETECTED Final   Sapovirus (I, II, IV, and V) NOT DETECTED NOT DETECTED Final    Comment: Performed at [facility], [address] [place], [place] [address]  C difficile quick scan w PCR reflex     Status: None   Collection Time: 04/18/19 10:42 PM  Result Value Ref Range Status   C Diff antigen NEGATIVE NEGATIVE Final   C Diff toxin NEGATIVE NEGATIVE Final   C Diff interpretation No C. difficile detected.  Final    Comment: Performed at [facility], [address] [place], [place] [address]      Scheduled Meds: . aspirin  81 mg Oral Daily  . ciprofloxacin  500 mg Oral Daily  . feeding supplement  1 Container Oral TID BM  . heparin injection (subcutaneous)  5,000 Units Subcutaneous Q8H  . multivitamin  1 tablet Oral QHS  . pantoprazole (PROTONIX) IV  40 mg Intravenous Q12H  . polyethylene glycol-electrolytes  4,000 mL Oral Once  . predniSONE  50 mg Oral Q breakfast  . sodium chloride flush  10-40 mL Intracatheter Q12H  . venlafaxine XR  37.5 mg Oral Q breakfast   Continuous Infusions: . sodium chloride 0  (04/20/19 1419)  . octreotide  (SANDOSTATIN)    IV infusion 50 mcg/hr (04/25/19 1620)  . sodium chloride      Assessment/Plan:  1. Bloody  diarrhea -for EGD , procedure was aborted as patient has significant amount of food.  Scheduled for colonoscopy in a.m. by Dr. Allegra Lai I appreciate her recommendations monitor CBC  2. acute kidney injury.  Likely secondary to hypovolemic shock and GI losses.  Case discussed with nephrology and temporary dialysis catheter placed on 04/21/2019 groin.  Patient had hemodialysis on 04/22/2019.  HD per Nephrology.  Trial of IV diuresis 3. hypo-kalemia replete and recheck in a.m. check magnesium also 4. hypovolemic shock.  Resolved holding antihypertensive medications.  Solu-Cortef to 25 mg twice a day. changed to p.o. prednisone which might improve her nausea 5. 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  Continue Cipro.  GI is following no new recommendations at this time 6. Acute blood loss anemia.  Patient responded to 1 unit of blood.  Hemoglobin stable over the last few days.  Hemoglobin at 8.8 7. Atrial fibrillation with rapid ventricular response converted over to normal sinus rhythm with amiodarone.  Now off amiodarone.  Patient is been in sinus bradycardia at this point. 8. Thrombocytopenia continue to monitor blood counts.-Secondary to severe sepsis resolved smear does show some red blood cell fragments.  Platelet count back to normal  9. Elevated liver function test.  Likely with hypotension.  Continue to monitor.  Hepatitis panel negative.  Ultrasound concerning for early cirrhosis even though CAT scan did not comment on this. 10. LE edema- third spacing, albumin 3.1, will supplement with nepro , dietary consult 11. DVT prophylaxis with Lovenox renal dose adjusted  Code Status:     Code Status Orders  (From admission, onward)         Start     Ordered   04/14/19 1533  Full code  Continuous     04/14/19 1532  Code Status History    This patient has a current code status but no historical code status.     Family Communication: Call placed to husband for  update Disposition Plan: To be determined  Consultants:  Critical care specialist  Gastroenterology  Nephrology  Oncology  Time spent: 33 minutes.  Spoke with nephrology  Ramonita LabAruna Deundre Thong  [employer]

## 2019-04-25 NOTE — Anesthesia Postprocedure Evaluation (Signed)
Anesthesia Post Note  Patient: Sarah Sullivan  Procedure(s) Performed: ESOPHAGOGASTRODUODENOSCOPY (EGD) (N/A )  Patient location during evaluation: Endoscopy Anesthesia Type: General Level of consciousness: awake and alert Pain management: pain level controlled Vital Signs Assessment: post-procedure vital signs reviewed and stable Respiratory status: spontaneous breathing, nonlabored ventilation, respiratory function stable and patient connected to nasal cannula oxygen Cardiovascular status: blood pressure returned to baseline and stable Postop Assessment: no apparent nausea or vomiting Anesthetic complications: no     Last Vitals:  Vitals:   04/25/19 1110 04/25/19 1416  BP: (!) 158/67 (!) 160/70  Pulse: 65 88  Resp: 18 16  Temp: (!) 36.2 C 37.1 C  SpO2: 99% 94%    Last Pain:  Vitals:   04/25/19 1416  TempSrc: Oral  PainSc:                  Lenard Simmer

## 2019-04-25 NOTE — Anesthesia Procedure Notes (Signed)
Date/Time: 04/25/2019 10:50 AM Performed by: Stormy Fabian, CRNA Pre-anesthesia Checklist: Patient identified, Emergency Drugs available, Suction available and Patient being monitored Patient Re-evaluated:Patient Re-evaluated prior to induction Oxygen Delivery Method: Nasal cannula Induction Type: IV induction Dental Injury: Teeth and Oropharynx as per pre-operative assessment

## 2019-04-25 NOTE — Progress Notes (Signed)
[facility]  ROUNDING NOTE   Subjective:   Denies acute c/o  Reports b/l LE edema No SOB - EGD today but only partial procedure due to food States appetite is good, now on clears for colonsocopy S Creatinine slightly lower without dialysis UOP 550 + cc    Objective:  Vital signs in last 24 hours:  Temp:  [97.2 F (36.2 C)-99 F (37.2 C)] 97.2 F (36.2 C) (06/02 1110) Pulse Rate:  [65-79] 65 (06/02 1110) Resp:  [15-20] 18 (06/02 1110) BP: (143-211)/(61-109) 158/67 (06/02 1110) SpO2:  [91 %-99 %] 99 % (06/02 1110)  Weight change:  Filed Weights   04/17/19 0301 04/17/19 1246 04/21/19 1532  Weight: 55.1 kg 57.4 kg 57.4 kg    Intake/Output: I/O last 3 completed shifts: In: 230.8 [I.V.:230.8] Out: 951 [Urine:950; Stool:1]   Intake/Output this shift:  No intake/output data recorded.  Physical Exam: General: NAD,   Head: Normocephalic, atraumatic. Moist oral mucosal membranes  Eyes: Anicteric,    Neck: Supple, trachea midline  Lungs:  Decreased sounds at bases  Heart: irregular  Abdomen:  Mild distension, nontender  Extremities: ++ peripheral edema.  Neurologic: Nonfocal, moving all four extremities  Skin: No lesions   Access: Left femoral temp HD catheter 5/29 Dr. Wyn Quaker    Basic Metabolic Panel: Recent Labs  Lab 04/21/19 4098 04/22/19 0245 04/23/19 0414 04/23/19 1721 04/24/19 0453 04/25/19 0359  NA 140 140 143  --  144 144  K 3.7 3.1* 2.8* 3.4* 3.3* 3.8  CL 102 100 102  --  105 107  CO2 --  29 27  GLUCOSE 109* 133* 109*  --  88 143*  BUN 65* 72* 53*  --  52* 58*  CREATININE 5.04* 5.39* 4.10*  --  4.49* 4.25*  CALCIUM 7.2* 7.4* 7.8*  --  8.0* 8.1*  MG  --   --  1.8  --  1.8 1.8    Liver Function Tests: Recent Labs  Lab 04/19/19 0556 04/20/19 0450 04/21/19 0352 04/22/19 0245  AST 204* 133* 113* 146*  ALT 172* 123* 105* 108*  ALKPHOS 423* 573* 663* 672*  BILITOT 1.1 1.0 1.0 0.9  PROT 3.9* 4.6* 5.1* 5.5*  ALBUMIN 1.6*  2.1* 2.5* 3.1*   No results for input(s): LIPASE, AMYLASE in the last 168 hours. No results for input(s): AMMONIA in the last 168 hours.  CBC: Recent Labs  Lab 04/19/19 0556  04/21/19 0352 04/22/19 0245 04/24/19 0453 04/24/19 1913 04/25/19 0359  WBC 9.2   < > 15.4* 14.3* 10.8* 7.4 7.6  NEUTROABS 7.1  --  9.9*  --   --   --   --   HGB 9.6*   < > 9.7* 8.8* 7.4* 8.2* 7.3*  HCT 28.4*   < > 27.3* 25.9* 22.6* 24.7* 22.3*  MCV 89.9   < > 85.8 89.9 90.4 92.5 93.3  PLT 95*   < > 152 151 132* 126* 119*   < > = values in this interval not displayed.    Cardiac Enzymes: No results for input(s): CKTOTAL, CKMB, CKMBINDEX, TROPONINI in the last 168 hours.  BNP: Invalid input(s): POCBNP  CBG: No results for input(s): GLUCAP in the last 168 hours.  Microbiology: Results for orders placed or performed during the hospital encounter of 04/14/19  SARS Coronavirus 2 (CEPHEID - Performed in [facility] hospital lab), Hosp Order     Status: None   Collection Time: 04/14/19 10:32 AM  Result Value Ref  Range Status   SARS Coronavirus 2 NEGATIVE NEGATIVE Final    Comment: (NOTE) If result is NEGATIVE SARS-CoV-2 target nucleic acids are NOT DETECTED. The SARS-CoV-2 RNA is generally detectable in upper and lower  respiratory specimens during the acute phase of infection. The lowest  concentration of SARS-CoV-2 viral copies this assay can detect is 250  copies / mL. A negative result does not preclude SARS-CoV-2 infection  and should not be used as the sole basis for treatment or other  patient management decisions.  A negative result may occur with  improper specimen collection / handling, submission of specimen other  than nasopharyngeal swab, presence of viral mutation(s) within the  areas targeted by this assay, and inadequate number of viral copies  (<250 copies / mL). A negative result must be combined with clinical  observations, patient history, and epidemiological information. If result  is POSITIVE SARS-CoV-2 target nucleic acids are DETECTED. The SARS-CoV-2 RNA is generally detectable in upper and lower  respiratory specimens dur ing the acute phase of infection.  Positive  results are indicative of active infection with SARS-CoV-2.  Clinical  correlation with patient history and other diagnostic information is  necessary to determine patient infection status.  Positive results do  not rule out bacterial infection or co-infection with other viruses. If result is PRESUMPTIVE POSTIVE SARS-CoV-2 nucleic acids MAY BE PRESENT.   A presumptive positive result was obtained on the submitted specimen  and confirmed on repeat testing.  While 2019 novel coronavirus  (SARS-CoV-2) nucleic acids may be present in the submitted sample  additional confirmatory testing may be necessary for epidemiological  and / or clinical management purposes  to differentiate between  SARS-CoV-2 and other Sarbecovirus currently known to infect humans.  If clinically indicated additional testing with an alternate test  methodology [phone]) is advised. The SARS-CoV-2 RNA is generally  detectable in upper and lower respiratory sp ecimens during the acute  phase of infection. The expected result is Negative. Fact Sheet for Patients:  BoilerBrush.com.cy Fact Sheet for Healthcare Providers: https://pope.com/ This test is not yet approved or cleared by the [place] FDA and has been authorized for detection and/or diagnosis of SARS-CoV-2 by FDA under an Emergency Use Authorization (EUA).  This EUA will remain in effect (meaning this test can be used) for the duration of the COVID-19 declaration under Section 564(b)(1) of the Act, 21 U.S.C. section 360bbb-3(b)(1), unless the authorization is terminated or revoked sooner. Performed at [facility], [address] [place], [place] [address]   MRSA PCR Screening     Status: None   Collection  Time: 04/14/19  7:22 PM  Result Value Ref Range Status   MRSA by PCR NEGATIVE NEGATIVE Final    Comment:        The GeneXpert MRSA Assay (FDA approved for NASAL specimens only), is one component of a comprehensive MRSA colonization surveillance program. It is not intended to diagnose MRSA infection nor to guide or monitor treatment for MRSA infections. Performed at [facility], [address] [place], [place] [address]   CULTURE, BLOOD (ROUTINE X 2) w Reflex to ID Panel     Status: None   Collection Time: 04/16/19  7:47 AM  Result Value Ref Range Status   Specimen Description BLOOD BLOOD RIGHT HAND  Final   Special Requests   Final    BOTTLES DRAWN AEROBIC AND ANAEROBIC Blood Culture adequate volume   Culture   Final    NO GROWTH 5  DAYS Performed at [facility], [address] [place], [place] [address]    Report Status 04/21/2019 FINAL  Final  CULTURE, BLOOD (ROUTINE X 2) w Reflex to ID Panel     Status: None   Collection Time: 04/16/19  9:27 AM  Result Value Ref Range Status   Specimen Description BLOOD BLOOD RIGHT HAND  Final   Special Requests   Final    BOTTLES DRAWN AEROBIC ONLY Blood Culture adequate volume   Culture   Final    NO GROWTH 5 DAYS Performed at [facility], [address] [place], [place] [address]    Report Status 04/21/2019 FINAL  Final  MRSA PCR Screening     Status: None   Collection Time: 04/17/19  1:47 PM  Result Value Ref Range Status   MRSA by PCR NEGATIVE NEGATIVE Final    Comment:        The GeneXpert MRSA Assay (FDA approved for NASAL specimens only), is one component of a comprehensive MRSA colonization surveillance program. It is not intended to diagnose MRSA infection nor to guide or monitor treatment for MRSA infections. Performed at [facility], [address] [place], [place] [address]   Gastrointestinal Panel by PCR , Stool     Status: Abnormal   Collection Time: 04/17/19 10:42  PM  Result Value Ref Range Status   Campylobacter species NOT DETECTED NOT DETECTED Final   Plesimonas shigelloides NOT DETECTED NOT DETECTED Final   Salmonella species NOT DETECTED NOT DETECTED Final   Yersinia enterocolitica NOT DETECTED NOT DETECTED Final   Vibrio species NOT DETECTED NOT DETECTED Final   Vibrio cholerae NOT DETECTED NOT DETECTED Final   Enteroaggregative E coli (EAEC) NOT DETECTED NOT DETECTED Final   Enteropathogenic E coli (EPEC) DETECTED (A) NOT DETECTED Final    Comment: RESULT CALLED TO, READ BACK BY AND VERIFIED WITH: BETH BUONO AT 0211 04/19/2019 SDR    Enterotoxigenic E coli (ETEC) NOT DETECTED NOT DETECTED Final   Shiga like toxin producing E coli (STEC) NOT DETECTED NOT DETECTED Final   Shigella/Enteroinvasive E coli (EIEC) NOT DETECTED NOT DETECTED Final   Cryptosporidium NOT DETECTED NOT DETECTED Final   Cyclospora cayetanensis NOT DETECTED NOT DETECTED Final   Entamoeba histolytica NOT DETECTED NOT DETECTED Final   Giardia lamblia NOT DETECTED NOT DETECTED Final   Adenovirus F40/41 NOT DETECTED NOT DETECTED Final   Astrovirus NOT DETECTED NOT DETECTED Final   Norovirus GI/GII NOT DETECTED NOT DETECTED Final   Rotavirus A NOT DETECTED NOT DETECTED Final   Sapovirus (I, II, IV, and V) NOT DETECTED NOT DETECTED Final    Comment: Performed at [facility], [address] [place], [place] [address]  C difficile quick scan w PCR reflex     Status: None   Collection Time: 04/18/19 10:42 PM  Result Value Ref Range Status   C Diff antigen NEGATIVE NEGATIVE Final   C Diff toxin NEGATIVE NEGATIVE Final   C Diff interpretation No C. difficile detected.  Final    Comment: Performed at [facility], [address] [place], [place] [address]    Coagulation Studies: No results for input(s): LABPROT, INR in the last 72 hours.  Urinalysis: No results for input(s): COLORURINE, LABSPEC, PHURINE, GLUCOSEU, HGBUR, BILIRUBINUR, KETONESUR,  PROTEINUR, UROBILINOGEN, NITRITE, LEUKOCYTESUR in the last 72 hours.  Invalid input(s): APPERANCEUR    Imaging: No results found.   Medications:   . sodium chloride 0  (04/20/19 1419)  .  octreotide  (SANDOSTATIN)    IV infusion 50 mcg/hr (04/25/19 0410)  . sodium chloride     . aspirin  81 mg Oral Daily  . ciprofloxacin  500 mg Oral Daily  . feeding supplement (NEPRO CARB STEADY)  237 mL Oral TID BM  . heparin injection (subcutaneous)  5,000 Units Subcutaneous Q8H  . magnesium citrate  1 Bottle Oral Once  . metoCLOPramide  10 mg Oral Once  . pantoprazole (PROTONIX) IV  40 mg Intravenous Q12H  . polyethylene glycol-electrolytes  4,000 mL Oral Once  . predniSONE  50 mg Oral Q breakfast  . sodium chloride flush  10-40 mL Intracatheter Q12H  . venlafaxine XR  37.5 mg Oral Q breakfast   sodium chloride, hydrALAZINE, meclizine, ondansetron **OR** ondansetron (ZOFRAN) IV, senna-docusate, sodium chloride flush, sodium chloride flush  Assessment/ Plan:  Ms. Sarah Sullivan is a 73 y.o. Asian female with a PMHx of hypertension, degenerative disc disease, lumbar radiculitis, history of depression, osteopenia, who was admitted to [facility] on 04/14/2019 for hypovolemic shock with E. Coli colitis.   1.  Acute renal failure with metabolic acidosis: secondary to prerenal azotemia, GI losses and blood losses. ATN. Nonoliguric urine output. Hematuria and proteinuria on admission.  Creatinine baseline of 1, GFR of 56 on 03/27/19. Consistent with chronic kidney disease stage III. Renal imaging- CT abdomen Abd/pelvis- 5/23 was unremarkable except for colitis  Evaluation for HD daily No acute indication today. Monitor UOP Trial of iv diuretic  2. LE edema - 3rd spacing and renal failure - monitor UOP    3. Anemia with renal failure: status post 1 unit PRBC 5/26. With thrombocytopenia Appreciate Hematology input - EPO with HD treatment.   4. Colitis:Enteropathogenic E coli (EPEC) - ciprofloxacin.     LOS: 9 Sarah Sullivan 6/2/20201:57 PM

## 2019-04-25 NOTE — Progress Notes (Signed)
Initial Nutrition Assessment  RD working remotely.  DOCUMENTATION CODES:   Not applicable  INTERVENTION:  While on clear liquids provide Boost Breeze po TID, each supplement provides 250 kcal and 9 grams of protein.  Once diet advanced past clear liquids resume Nepro Shake po TID, each supplement provides 425 kcal and 19 grams protein.  Provide Rena-vite QHS.  NUTRITION DIAGNOSIS:   Inadequate oral intake related to decreased appetite as evidenced by per patient/family report.  GOAL:   Patient will meet greater than or equal to 90% of their needs  MONITOR:   PO intake, Supplement acceptance, Labs, Weight trends, I & O's  REASON FOR ASSESSMENT:   Consult Assessment of nutrition requirement/status, Poor PO  ASSESSMENT:   73 year old female with PMHx of HTN admitted with AKI, hypovolemic shock, enteropathogenic E coli infection with colitis and hematochezia, acute blood loss anemia, A-fib with RVR.   -EGD was attempted today but could not be completed due to large amount of food in stomach. Normal esophagus and GE junction. Plan is for colonoscopy tomorrow.  Spoke with patient over the phone. She is not the best historian. She reports her appetite is not good but is unsure how long it has been decreased. She is unsure to describe how she was eating PTA or how she has been eating in the hospital. Meal completion data has only been filled out for 3 meals this entire admission. She ate 0% of lunch on 5/24, 30% of breakfast on 5/31, and 100% of breakfast on 6/1. Per chart patient had first HD treatment on 5/30.   Patient reports she is unsure of her UBW or weight trend as she does not have a scale. Per chart she was 51 kg on 5/24 and was up to 57.4 kg on 5/29. Likely related to fluid retention or use of different scale.  Medications reviewed and include: ciprofloxacin, pantoprazole, prednisone 50 mg daily, octreotide.  Labs reviewed: BUN 58, Creatinine 4.25, Ferritin  560.  NUTRITION - FOCUSED PHYSICAL EXAM:  Unable to complete at this time.  Diet Order:   Diet Order            Diet NPO time specified Except for: Ice Chips, Sips with Meds  Diet effective midnight        Diet clear liquid Room service appropriate? Yes; Fluid consistency: Thin  Diet effective now             EDUCATION NEEDS:   Not appropriate for education at this time  Skin:  Skin Assessment: Reviewed RN Assessment  Last BM:  04/24/2019 - small type 7  Height:   Ht Readings from Last 1 Encounters:  04/21/19 4\' 11"  (1.499 m)   Weight:   Wt Readings from Last 1 Encounters:  No data found for Wt   Ideal Body Weight:  44.7 kg  BMI:  Body mass index is 25.56 kg/m.  Estimated Nutritional Needs:   Kcal:  1600-1800  Protein:  70-80 grams  Fluid:  UOP + 1 L  Helane Rima, MS, RD, LDN Office: [phone] Pager: [phone] After Hours/Weekend Pager: [phone]

## 2019-04-25 NOTE — Anesthesia Post-op Follow-up Note (Signed)
Anesthesia QCDR form completed.        

## 2019-04-25 NOTE — Progress Notes (Signed)
Physical Therapy Cancellation Note   Patient at procedure or test/unavailable.  Patient currently off unit for EGD.  Will re-attempt at later time/date as medically appropriate and available  Sarah Sullivan H. Manson Passey, PT, DPT, NCS 04/25/19, 11:11 AM [phone]

## 2019-04-26 ENCOUNTER — Inpatient Hospital Stay: Payer: Medicare Other | Admitting: Anesthesiology

## 2019-04-26 ENCOUNTER — Inpatient Hospital Stay: Payer: Self-pay

## 2019-04-26 ENCOUNTER — Encounter
Admission: EM | Disposition: A | Discharge status: Home Health | Payer: Self-pay | Source: Home / Self Care | Attending: Internal Medicine

## 2019-04-26 DIAGNOSIS — K250 Acute gastric ulcer with hemorrhage: Secondary | ICD-10-CM

## 2019-04-26 DIAGNOSIS — A419 Sepsis, unspecified organism: Secondary | ICD-10-CM

## 2019-04-26 DIAGNOSIS — D649 Anemia, unspecified: Secondary | ICD-10-CM

## 2019-04-26 HISTORY — PX: ESOPHAGOGASTRODUODENOSCOPY: SHX5428

## 2019-04-26 HISTORY — PX: COLONOSCOPY: SHX5424

## 2019-04-26 LAB — HEPATITIS B DNA, ULTRAQUANTITATIVE, PCR
HBV DNA SERPL PCR-ACNC: NOT DETECTED IU/mL
HBV DNA SERPL PCR-LOG IU: UNDETERMINED log10 IU/mL

## 2019-04-26 LAB — RETIC PANEL
Immature Retic Fract: 42.3 % — ABNORMAL HIGH (ref 2.3–15.9)
RBC.: 2.27 MIL/uL — ABNORMAL LOW (ref 3.87–5.11)
Retic Count, Absolute: 118.9 10*3/uL (ref 19.0–186.0)
Retic Ct Pct: 5.2 % — ABNORMAL HIGH (ref 0.4–3.1)
Reticulocyte Hemoglobin: 33.3 pg (ref >27.9)

## 2019-04-26 LAB — BASIC METABOLIC PANEL
Anion gap: 11 (ref 5–15)
BUN: 55 mg/dL — ABNORMAL HIGH (ref 8–23)
CO2: 26 mmol/L (ref 22–32)
Calcium: 7.9 mg/dL — ABNORMAL LOW (ref 8.9–10.3)
Chloride: 108 mmol/L (ref 98–111)
Creatinine, Ser: 3.8 mg/dL — ABNORMAL HIGH (ref 0.44–1.00)
GFR calc Af Amer: 13 mL/min — ABNORMAL LOW (ref >60)
GFR calc non Af Amer: 11 mL/min — ABNORMAL LOW (ref >60)
Glucose, Bld: 130 mg/dL — ABNORMAL HIGH (ref 70–99)
Potassium: 3.3 mmol/L — ABNORMAL LOW (ref 3.5–5.1)
Sodium: 145 mmol/L (ref 135–145)

## 2019-04-26 LAB — PROTEIN / CREATININE RATIO, URINE
Creatinine, Urine: 51 mg/dL
Protein Creatinine Ratio: 1.51 mg/mg{Cre} — ABNORMAL HIGH (ref 0.00–0.15)
Total Protein, Urine: 77 mg/dL

## 2019-04-26 LAB — NOVEL CORONAVIRUS, NAA (HOSP ORDER, SEND-OUT TO REF LAB; TAT 18-24 HRS): SARS-CoV-2, NAA: NOT DETECTED

## 2019-04-26 LAB — CBC
HCT: 21.6 % — ABNORMAL LOW (ref 36.0–46.0)
Hemoglobin: 6.9 g/dL — ABNORMAL LOW (ref 12.0–15.0)
MCH: 30.4 pg (ref 26.0–34.0)
MCHC: 31.9 g/dL (ref 30.0–36.0)
MCV: 95.2 fL (ref 80.0–100.0)
Platelets: 124 10*3/uL — ABNORMAL LOW (ref 150–400)
RBC: 2.27 MIL/uL — ABNORMAL LOW (ref 3.87–5.11)
RDW: 16.3 % — ABNORMAL HIGH (ref 11.5–15.5)
WBC: 8.5 10*3/uL (ref 4.0–10.5)
nRBC: 0 % (ref 0.0–0.2)

## 2019-04-26 LAB — CERULOPLASMIN: Ceruloplasmin: 19.9 mg/dL (ref 19.0–39.0)

## 2019-04-26 LAB — ALPHA-1 ANTITRYPSIN PHENOTYPE: A-1 Antitrypsin, Ser: 181 mg/dL (ref 101–187)

## 2019-04-26 LAB — PREPARE RBC (CROSSMATCH)

## 2019-04-26 LAB — VARICELLA-ZOSTER BY PCR: Varicella-Zoster, PCR: NEGATIVE

## 2019-04-26 LAB — MITOCHONDRIAL ANTIBODIES: Mitochondrial M2 Ab, IgG: 145 Units — ABNORMAL HIGH (ref 0.0–20.0)

## 2019-04-26 LAB — ANTI-SMOOTH MUSCLE ANTIBODY, IGG: F-Actin IgG: 10 Units (ref 0–19)

## 2019-04-26 SURGERY — EGD (ESOPHAGOGASTRODUODENOSCOPY)
Anesthesia: General

## 2019-04-26 MED ORDER — SODIUM CHLORIDE 0.9 % IV SOLN
INTRAVENOUS | Status: DC
  Administered 2019-04-26: 13:00:00 via INTRAVENOUS

## 2019-04-26 MED ORDER — POTASSIUM CHLORIDE CRYS ER 20 MEQ PO TBCR
20.0000 meq | EXTENDED_RELEASE_TABLET | Freq: Once | ORAL | Status: AC
  Administered 2019-04-26: 09:00:00 20 meq via ORAL
  Filled 2019-04-26: qty 1

## 2019-04-26 MED ORDER — POTASSIUM CHLORIDE 20 MEQ PO PACK: 20.0000 meq | PACK | Freq: Once | ORAL | Status: DC

## 2019-04-26 MED ORDER — POTASSIUM CHLORIDE 20 MEQ PO PACK: 40.0000 meq | PACK | Freq: Once | ORAL | Status: DC

## 2019-04-26 MED ORDER — PROPOFOL 500 MG/50ML IV EMUL
INTRAVENOUS | Status: DC | PRN
  Administered 2019-04-26: 125 ug/kg/min via INTRAVENOUS

## 2019-04-26 MED ORDER — SODIUM CHLORIDE 0.9% IV SOLUTION
Freq: Once | INTRAVENOUS | Status: AC
  Administered 2019-04-26: 16:00:00 via INTRAVENOUS

## 2019-04-26 MED ORDER — PROPOFOL 10 MG/ML IV BOLUS
INTRAVENOUS | Status: DC | PRN
  Administered 2019-04-26: 40 mg via INTRAVENOUS

## 2019-04-26 MED ORDER — FUROSEMIDE 10 MG/ML IJ SOLN
20.0000 mg | Freq: Once | INTRAMUSCULAR | Status: AC
  Administered 2019-04-26: 20 mg via INTRAVENOUS
  Filled 2019-04-26: qty 2

## 2019-04-26 MED ORDER — POLYETHYLENE GLYCOL 3350 17 GM/SCOOP PO POWD
1.0000 | Freq: Once | ORAL | Status: AC
  Administered 2019-04-26: 255 g via ORAL
  Filled 2019-04-26 (×2): qty 255

## 2019-04-26 NOTE — Progress Notes (Signed)
Per Dr. Thedore Mins, [facility] hold off PICC placement, floor RN to cancel order.

## 2019-04-26 NOTE — OR Nursing (Signed)
Pt still having dark stool.... colonoscopy cancelled and rescheduled

## 2019-04-26 NOTE — Progress Notes (Signed)
Patient ID: Sarah Sullivan, female   DOB: 1946/02/18, 73 y.o.   MRN: [id number]  [facility] PROGRESS NOTE  Makynlie Franca FOY:[id number] DOB: [birth year] DOA: 04/14/2019 PCP: Titus Mould, NP  HPI/Subjective: Patient with  litle blood in her stool last  night.  Denies any nausea or vomiting.  Awaiting colonoscopy today patient had temporary hemodialysis catheter placement 04/21/2019 nausea but no vomiting.  Abdominal pain is less.    Objective: Vitals:   04/26/19 0535 04/26/19 0601  BP: (!) 187/81 (!) 151/80  Pulse: 65 74  Resp: 16   Temp: 98.4 F (36.9 C)   SpO2: 95%     Filed Weights   04/17/19 0301 04/17/19 1246 04/21/19 1532  Weight: 55.1 kg 57.4 kg 57.4 kg    ROS: Review of Systems  Constitutional: Negative for chills and fever.  Eyes: Negative for blurred vision.  Respiratory: Negative for cough and shortness of breath.   Cardiovascular: Negative for chest pain.  Gastrointestinal: Negative for abdominal pain, constipation, diarrhea, nausea and vomiting.  Genitourinary: Negative for dysuria.  Musculoskeletal: Negative for joint pain.  Neurological: Negative for dizziness and headaches.   Exam: Physical Exam  Constitutional: She is oriented to person, place, and time.  HENT:  Nose: No mucosal edema.  Mouth/Throat: No oropharyngeal exudate or posterior oropharyngeal edema.  Eyes: Pupils are equal, round, and reactive to light. Conjunctivae, EOM and lids are normal.  Neck: No JVD present. Carotid bruit is not present. No edema present. No thyroid mass and no thyromegaly present.  Cardiovascular: S1 normal and S2 normal. Exam reveals no gallop.  No murmur heard. Pulses:      Dorsalis pedis pulses are 2+ on the right side and 2+ on the left side.  Respiratory: No respiratory distress. She has decreased breath sounds in the right lower field and the left lower field. She has no wheezes. She has no rhonchi. She has no rales.  GI: Soft. Bowel sounds are  normal. There is generalized abdominal tenderness.  Left groin with temporary TLC catheter, no active bleeding  Musculoskeletal:     Right ankle: She exhibits no swelling.     Left ankle: She exhibits no swelling.  Lymphadenopathy:    She has no cervical adenopathy.  Neurological: She is alert and oriented to person, place, and time. No cranial nerve deficit.  Skin: Skin is warm. No rash noted. Nails show no clubbing.  Psychiatric: She has a normal mood and affect.      Data Reviewed: Basic Metabolic Panel: Recent Labs  Lab 04/22/19 0245 04/23/19 0414 04/23/19 1721 04/24/19 0453 04/25/19 0359 04/26/19 0543  NA 140 143  --  144 144 145  K 3.1* 2.8* 3.4* 3.3* 3.8 3.3*  CL 100 102  --  105 107 108  CO2 28 28  --  29 27 26   GLUCOSE 133* 109*  --  88 143* 130*  BUN 72* 53*  --  52* 58* 55*  CREATININE 5.39* 4.10*  --  4.49* 4.25* 3.80*  CALCIUM 7.4* 7.8*  --  8.0* 8.1* 7.9*  MG  --  1.8  --  1.8 1.8  --    Liver Function Tests: Recent Labs  Lab 04/20/19 0450 04/21/19 0352 04/22/19 0245  AST 133* 113* 146*  ALT 123* 105* 108*  ALKPHOS 573* 663* 672*  BILITOT 1.0 1.0 0.9  PROT 4.6* 5.1* 5.5*  ALBUMIN 2.1* 2.5* 3.1*   No results for input(s): LIPASE, AMYLASE in the last 168 hours. CBC: Recent Labs  Lab 04/21/19 0352 04/22/19 0245 04/24/19 0453 04/24/19 1913 04/25/19 0359 04/25/19 1755 04/25/19 2254 04/26/19 0543  WBC 15.4* 14.3* 10.8* 7.4 7.6  --   --  8.5  NEUTROABS 9.9*  --   --   --   --   --   --   --   HGB 9.7* 8.8* 7.4* 8.2* 7.3* 7.6* 7.4* 6.9*  HCT 27.3* 25.9* 22.6* 24.7* 22.3* 23.0* 22.5* 21.6*  MCV 85.8 89.9 90.4 92.5 93.3  --   --  95.2  PLT 152 151 132* 126* 119*  --   --  124*   Cardiac Enzymes: No results for input(s): CKTOTAL, CKMB, CKMBINDEX, TROPONINI in the last 168 hours.   Recent Results (from the past 240 hour(s))  CULTURE, BLOOD (ROUTINE X 2) w Reflex to ID Panel     Status: None   Collection Time: 04/16/19  9:27 AM  Result Value  Ref Range Status   Specimen Description BLOOD BLOOD RIGHT HAND  Final   Special Requests   Final    BOTTLES DRAWN AEROBIC ONLY Blood Culture adequate volume   Culture   Final    NO GROWTH 5 DAYS Performed at [facility], [address] [place], [place] [address]    Report Status 04/21/2019 FINAL  Final  MRSA PCR Screening     Status: None   Collection Time: 04/17/19  1:47 PM  Result Value Ref Range Status   MRSA by PCR NEGATIVE NEGATIVE Final    Comment:        The GeneXpert MRSA Assay (FDA approved for NASAL specimens only), is one component of a comprehensive MRSA colonization surveillance program. It is not intended to diagnose MRSA infection nor to guide or monitor treatment for MRSA infections. Performed at [facility], [address] [place], [place] [address]   Gastrointestinal Panel by PCR , Stool     Status: Abnormal   Collection Time: 04/17/19 10:42 PM  Result Value Ref Range Status   Campylobacter species NOT DETECTED NOT DETECTED Final   Plesimonas shigelloides NOT DETECTED NOT DETECTED Final   Salmonella species NOT DETECTED NOT DETECTED Final   Yersinia enterocolitica NOT DETECTED NOT DETECTED Final   Vibrio species NOT DETECTED NOT DETECTED Final   Vibrio cholerae NOT DETECTED NOT DETECTED Final   Enteroaggregative E coli (EAEC) NOT DETECTED NOT DETECTED Final   Enteropathogenic E coli (EPEC) DETECTED (A) NOT DETECTED Final    Comment: RESULT CALLED TO, READ BACK BY AND VERIFIED WITH: BETH BUONO AT 0211 04/19/2019 SDR    Enterotoxigenic E coli (ETEC) NOT DETECTED NOT DETECTED Final   Shiga like toxin producing E coli (STEC) NOT DETECTED NOT DETECTED Final   Shigella/Enteroinvasive E coli (EIEC) NOT DETECTED NOT DETECTED Final   Cryptosporidium NOT DETECTED NOT DETECTED Final   Cyclospora cayetanensis NOT DETECTED NOT DETECTED Final   Entamoeba histolytica NOT DETECTED NOT DETECTED Final   Giardia lamblia NOT DETECTED NOT DETECTED  Final   Adenovirus F40/41 NOT DETECTED NOT DETECTED Final   Astrovirus NOT DETECTED NOT DETECTED Final   Norovirus GI/GII NOT DETECTED NOT DETECTED Final   Rotavirus A NOT DETECTED NOT DETECTED Final   Sapovirus (I, II, IV, and V) NOT DETECTED NOT DETECTED Final    Comment: Performed at [facility], [address] [place], [place] [address]  C difficile quick scan w PCR reflex     Status: None   Collection Time: 04/18/19 10:42 PM  Result Value Ref Range  Status   C Diff antigen NEGATIVE NEGATIVE Final   C Diff toxin NEGATIVE NEGATIVE Final   C Diff interpretation No C. difficile detected.  Final    Comment: Performed at [facility], [address] [place], [place] [address]  Novel Coronavirus, NAA (hospital order; send-out to ref lab)     Status: None   Collection Time: 04/25/19  5:04 PM  Result Value Ref Range Status   SARS-CoV-2, NAA NOT DETECTED NOT DETECTED Final    Comment: (NOTE) This test was developed and its performance characteristics determined by [employer]. This test has not been FDA cleared or approved. This test has been authorized by FDA under an Emergency Use Authorization (EUA). This test is only authorized for the duration of time the declaration that circumstances exist justifying the authorization of the emergency use of in vitro diagnostic tests for detection of SARS-CoV-2 virus and/or diagnosis of COVID-19 infection under section 564(b)(1) of the Act, 21 U.S.C. 962XBM-8(U)(1), unless the authorization is terminated or revoked sooner. When diagnostic testing is negative, the possibility of a false negative result should be considered in the context of a patient's recent exposures and the presence of clinical signs and symptoms consistent with COVID-19. An individual without symptoms of COVID-19 and who is not shedding SARS-CoV-2 virus would expect to have a negative (not detected) result in this assay. Performed  At: [facility] [address] [place], [place] [address] Jolene Schimke MD OZ:[id number]    Coronavirus Source NASOPHARYNGEAL  Final    Comment: Performed at [facility], [address] [place], [place] [address]      Scheduled Meds: . sodium chloride   Intravenous Once  . aspirin  81 mg Oral Daily  . ciprofloxacin  500 mg Oral Daily  . feeding supplement  1 Container Oral TID BM  . furosemide  20 mg Intravenous Once  . multivitamin  1 tablet Oral QHS  . pantoprazole (PROTONIX) IV  40 mg Intravenous Q12H  . potassium chloride  20 mEq Oral Once  . predniSONE  50 mg Oral Q breakfast  . sodium chloride flush  10-40 mL Intracatheter Q12H  . venlafaxine XR  37.5 mg Oral Q breakfast   Continuous Infusions: . sodium chloride 0  (04/20/19 1419)  . octreotide  (SANDOSTATIN)    IV infusion 50 mcg/hr (04/26/19 0723)  . sodium chloride      Assessment/Plan:  1. Bloody diarrhea -for EGD , procedure was aborted as patient has significant amount of food.  Scheduled for colonoscopy today. by Dr. Allegra Lai I appreciate her recommendations monitor CBC  2. acute kidney injury.  Likely secondary to hypovolemic shock and GI losses.  Case discussed with nephrology and temporary dialysis catheter placed on 04/21/2019 groin.  Patient had hemodialysis on 04/22/2019.  HD per Nephrology.  Trial of IV diuresis 3. Symptomatic anemia- 1 u prbc today , pt ok  4. hypovolemic shock.  Resolved holding antihypertensive medications.  Solu-Cortef to 25 mg twice a day. changed to p.o. prednisone which might improve her nausea 5. 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  Continue Cipro.  GI is following no new recommendations at this time 6. Acute blood loss anemia.  Patient responded to 1 unit of blood.  Hemoglobin stable over the last few days.  Hemoglobin at 8.8 7. Atrial fibrillation with rapid ventricular response converted over to normal sinus rhythm with amiodarone.  Now off  amiodarone.  Patient is been in sinus bradycardia at this  point. 8. Thrombocytopenia continue to monitor blood counts.-Secondary to severe sepsis resolved smear does show some red blood cell fragments.  Platelet count back to normal  9. Elevated liver function test.  Likely with hypotension.  Continue to monitor.  Hepatitis panel negative.  Ultrasound concerning for early cirrhosis even though CAT scan did not comment on this. 10. LE edema- third spacing, albumin 3.1, will supplement with nepro , dietary seeing pt , supplements  11. DVT prophylaxis with Lovenox held   Code Status:     Code Status Orders  (From admission, onward)         Start     Ordered   04/14/19 1533  Full code  Continuous     04/14/19 1532        Code Status History    This patient has a current code status but no historical code status.     Family Communication: Call placed to husband for update and left VM to call back on 6/2 , 6/3  Disposition Plan: To be determined  Consultants:  Critical care specialist  Gastroenterology  Nephrology  Oncology  Time spent: 33 minutes.  Spoke with nephrology  [facility]  [facility]

## 2019-04-26 NOTE — Transfer of Care (Signed)
Immediate Anesthesia Transfer of Care Note  Patient: Sarah Sullivan  Procedure(s) Performed: ESOPHAGOGASTRODUODENOSCOPY (EGD) (N/A ) COLONOSCOPY (N/A )  Patient Location: PACU  Anesthesia Type:General  Level of Consciousness: sedated  Airway & Oxygen Therapy: Patient Spontanous Breathing and Patient connected to nasal cannula oxygen  Post-op Assessment: Report given to RN and Post -op Vital signs reviewed and stable  Post vital signs: Reviewed and stable  Last Vitals:  Vitals Value Taken Time  BP 173/89 04/26/2019  1:47 PM  Temp    Pulse 71 04/26/2019  1:49 PM  Resp 16 04/26/2019  1:49 PM  SpO2 100 % 04/26/2019  1:49 PM  Vitals shown include unvalidated device data.  Last Pain:  Vitals:   04/26/19 1234  TempSrc: Tympanic  PainSc: 0-No pain      Patients Stated Pain Goal: 0 (04/18/19 0700)  Complications: No apparent anesthesia complications

## 2019-04-26 NOTE — Care Management Important Message (Signed)
Important Message  Patient Details  Name: Sera Terranova MRN: [id number] Date of Birth: [birth year]   Medicare Important Message Given:  Yes    Olegario Messier A Giancarlo Askren 04/26/2019, 11:53 AM

## 2019-04-26 NOTE — Anesthesia Preprocedure Evaluation (Signed)
Anesthesia Evaluation  Patient identified by MRN, date of birth, ID band Patient awake    Reviewed: Allergy & Precautions, H&P , NPO status , Patient's Chart, lab work & pertinent test results, reviewed documented beta blocker date and time   History of Anesthesia Complications Negative for: history of anesthetic complications  Airway Mallampati: III  TM Distance: >3 FB Neck ROM: full    Dental  (+) Dental Advidsory Given, Poor Dentition, Missing, Chipped   Pulmonary neg pulmonary ROS,           Cardiovascular Exercise Tolerance: Good hypertension, (-) angina(-) Past MI (-) dysrhythmias (-) Valvular Problems/Murmurs     Neuro/Psych negative neurological ROS  negative psych ROS   GI/Hepatic negative GI ROS, Neg liver ROS,   Endo/Other  negative endocrine ROS  Renal/GU Renal disease  negative genitourinary   Musculoskeletal   Abdominal   Peds  Hematology  (+) Blood dyscrasia, anemia ,   Anesthesia Other Findings Past Medical History: No date: Hypertension  Past Surgical History: No date: ABDOMINAL HYSTERECTOMY 04/25/2019: ESOPHAGOGASTRODUODENOSCOPY; N/A     Comment:  Procedure: ESOPHAGOGASTRODUODENOSCOPY (EGD);  Surgeon:               Toney Reil, MD;  [facility] ENDOSCOPY;                Service: Gastroenterology;  Laterality: N/A; 04/21/2019: TEMPORARY DIALYSIS CATHETER; N/A     Comment:  Procedure: TEMPORARY DIALYSIS CATHETER;  Surgeon: Annice Needy, MD;  Location: [facility] INVASIVE CV LAB;  Service:               Cardiovascular;  Laterality: N/A;  BMI    Body Mass Index:  25.56 kg/m      Reproductive/Obstetrics negative OB ROS                             Anesthesia Physical  Anesthesia Plan  ASA: III  Anesthesia Plan: General   Post-op Pain Management:    Induction: Intravenous  PONV Risk Score and Plan: 3 and Propofol infusion and TIVA  Airway  Management Planned: Nasal Cannula and Natural Airway  Additional Equipment:   Intra-op Plan:   Post-operative Plan:   Informed Consent: I have reviewed the patients History and Physical, chart, labs and discussed the procedure including the risks, benefits and alternatives for the proposed anesthesia with the patient or authorized representative who has indicated his/her understanding and acceptance.     Dental Advisory Given  Plan Discussed with: Anesthesiologist, CRNA and Surgeon  Anesthesia Plan Comments: (Patient reports that she had food in her stomach yesterday for her EGD because she ate food yesterday.  Patient reports to be NPO appropriate today with no reported nausea or vomiting today.   Patient consented for risks of anesthesia including but not limited to:  - adverse reactions to medications - risk of intubation if required - damage to teeth, lips or other oral mucosa - sore throat or hoarseness - Damage to heart, brain, lungs or loss of life  Patient voiced understanding.)        Anesthesia Quick Evaluation

## 2019-04-26 NOTE — Anesthesia Post-op Follow-up Note (Signed)
Anesthesia QCDR form completed.        

## 2019-04-26 NOTE — Progress Notes (Addendum)
Pharmacy Electrolyte Monitoring Consult:  Pharmacy consulted to assist in monitoring and replacing electrolytes in this 73 y.o. female admitted on 04/14/2019 with Nausea and Emesis   Labs:  Sodium (mmol/L)  Date Value  04/26/2019 145  12/25/2014 141   Potassium (mmol/L)  Date Value  04/26/2019 3.3 (L)  12/25/2014 3.5   Magnesium (mg/dL)  Date Value  83/07/4075 1.8   Phosphorus (mg/dL)  Date Value  80/88/1103 3.0   Calcium (mg/dL)  Date Value  15/94/5859 7.9 (L)   Calcium, Total (mg/dL)  Date Value  29/24/4628 9.2   Albumin (g/dL)  Date Value  63/81/7711 3.1 (L)    Assessment/Plan: Patient in acute renal failure, started on HD on 5/30  5/31 @1721  K 3.4  - Received KCl po 6/1 @ 0453 K 3.3, Mg 1.8  6/2 @ 0359 K 3.8, Mg 1.8 6/3 @ 0543 K 3.3  - Will give Kcl po x1 and recheck potassium with am labs  Will need to be cautious with supplementation due to renal fucntion.  Pharmacy will continue to monitor and adjust per consult.   Albina Billet, PharmD, BCPS Clinical Pharmacist 04/26/2019 7:57 AM

## 2019-04-26 NOTE — Progress Notes (Signed)
PT Cancellation Note  Patient Details Name: Sarah Sullivan MRN: [id number] DOB: 1946-01-27   Cancelled Treatment:    Reason Eval/Treat Not Completed: Patient at procedure or test/unavailable(Patient now off unit for colonoscopy.  will continue efforts next date)   Kesia Dalto H. Manson Passey, PT, DPT, NCS 04/26/19, 2:50 PM [phone]

## 2019-04-26 NOTE — Anesthesia Postprocedure Evaluation (Signed)
Anesthesia Post Note  Patient: Khrystine Druschel  Procedure(s) Performed: ESOPHAGOGASTRODUODENOSCOPY (EGD) (N/A ) COLONOSCOPY (N/A )  Patient location during evaluation: Endoscopy Anesthesia Type: General Level of consciousness: awake and alert Pain management: pain level controlled Vital Signs Assessment: post-procedure vital signs reviewed and stable Respiratory status: spontaneous breathing, nonlabored ventilation, respiratory function stable and patient connected to nasal cannula oxygen Cardiovascular status: blood pressure returned to baseline and stable Postop Assessment: no apparent nausea or vomiting Anesthetic complications: no     Last Vitals:  Vitals:   04/26/19 1407 04/26/19 1445  BP: (!) 188/90 (!) 175/90  Pulse: 67 65  Resp: (!) 21 18  Temp:  36.8 C  SpO2: 100% 98%    Last Pain:  Vitals:   04/26/19 1445  TempSrc: Oral  PainSc:                  Cleda Mccreedy Driana Dazey

## 2019-04-26 NOTE — Op Note (Signed)
[facility] Gastroenterology Patient Name: Sarah Sullivan Procedure Date: 04/26/2019 12:59 PM MRN: [id number] Account #: [id number] Date of Birth: [birth year] Admit Type: Outpatient Age: 73 Room: [facility] ENDO ROOM 3 Gender: Female Note Status: Finalized Procedure:            Upper GI endoscopy Indications:          Acute post hemorrhagic anemia, Melena Providers:            Lin Landsman MD, MD Referring MD:         Ricardo Jericho (Referring MD) Medicines:            Monitored Anesthesia Care Complications:        No immediate complications. Estimated blood loss: None. Procedure:            Pre-Anesthesia Assessment:                       - Prior to the procedure, a History and Physical was                        performed, and patient medications and allergies were                        reviewed. The patient is competent. The risks and                        benefits of the procedure and the sedation options and                        risks were discussed with the patient. All questions                        were answered and informed consent was obtained.                        Patient identification and proposed procedure were                        verified by the physician, the nurse, the                        anesthesiologist, the anesthetist and the technician in                        the pre-procedure area in the procedure room in the                        endoscopy suite. Mental Status Examination: alert and                        oriented. Airway Examination: normal oropharyngeal                        airway and neck mobility. Respiratory Examination:                        clear to auscultation. CV Examination: normal.                        Prophylactic Antibiotics: The patient does not require  prophylactic antibiotics. Prior Anticoagulants: The                        patient has taken no previous anticoagulant or                         antiplatelet agents. ASA Grade Assessment: III - A                        patient with severe systemic disease. After reviewing                        the risks and benefits, the patient was deemed in                        satisfactory condition to undergo the procedure. The                        anesthesia plan was to use monitored anesthesia care                        (MAC). Immediately prior to administration of                        medications, the patient was re-assessed for adequacy                        to receive sedatives. The heart rate, respiratory rate,                        oxygen saturations, blood pressure, adequacy of                        pulmonary ventilation, and response to care were                        monitored throughout the procedure. The physical status                        of the patient was re-assessed after the procedure.                       After obtaining informed consent, the endoscope was                        passed under direct vision. Throughout the procedure,                        the patient's blood pressure, pulse, and oxygen                        saturations were monitored continuously. The Endoscope                        was introduced through the mouth, and advanced to the                        second part of duodenum. The upper GI endoscopy was  accomplished without difficulty. The patient tolerated                        the procedure well. Findings:      Few non-bleeding linear and superficial duodenal ulcers with a clean       ulcer base (Forrest Class III) were found in the second portion of the       duodenum. The largest lesion was 5 mm in largest dimension.      Many non-obstructing non-bleeding cratered gastric ulcers with a clean       ulcer base (Forrest Class III) were found on the lesser curvature of the       stomach and in the gastric antrum. The largest lesion was 10 mm in        largest dimension. There is no evidence of perforation. Biopsies were       taken with a cold forceps for Helicobacter pylori testing.      The gastroesophageal junction and examined esophagus were normal. Impression:           - Non-bleeding duodenal ulcers with a clean ulcer base                        (Forrest Class III).                       - Non-obstructing non-bleeding gastric ulcers with a                        clean ulcer base (Forrest Class III). There is no                        evidence of perforation. Biopsied.                       - Normal gastroesophageal junction and esophagus. Recommendation:       - Await pathology results.                       - No ibuprofen, naproxen, or other non-steroidal                        anti-inflammatory drugs.                       - Use a proton pump inhibitor PO BID for 3 months.                       - Repeat upper endoscopy in 2-3 months to check healing.                       - Return to my office in 3 weeks. Procedure Code(s):    --- Professional ---                       [phone], Esophagogastroduodenoscopy, flexible, transoral;                        with biopsy, single or multiple Diagnosis Code(s):    --- Professional ---                       K26.9, Duodenal ulcer, unspecified as acute  or chronic,                        without hemorrhage or perforation                       K25.9, Gastric ulcer, unspecified as acute or chronic,                        without hemorrhage or perforation                       D62, Acute posthemorrhagic anemia                       K92.1, Melena (includes Hematochezia) CPT copyright 2019 American Medical Association. All rights reserved. The codes documented in this report are preliminary and upon coder review may  be revised to meet current compliance requirements. Dr. Ulyess Mort Lin Landsman MD, MD 04/26/2019 1:38:03 PM This report has been signed electronically. Number of  Addenda: 0 Note Initiated On: 04/26/2019 12:59 PM      [facility]

## 2019-04-26 NOTE — Progress Notes (Signed)
Hematology/Oncology Progress Note [facility] [phone] [phone]  Patient Care Team: Titus Mould, NP as PCP - General (Family Medicine)   Name of the patient: Sarah Sullivan  [id number]  74/18/1947  Date of visit: 04/26/19  SUBJECTIVE:  Patient was seen and examined at the bedside.  Denies any abdominal pain or shortness of breath.  Still feels weak. Endoscopy aborted due to poor prep.  on dialysis.  Edema is better. Per RN Thayer Ohm, small amount of blood in the stool.  No nausea vomiting.  Review of systems- Review of Systems  Constitutional: Positive for fatigue. Negative for appetite change.  Respiratory: Negative for shortness of breath.   Cardiovascular: Negative for chest pain.  Gastrointestinal: Positive for blood in stool. Negative for abdominal pain.       Abdominal discomfort  Genitourinary: Negative for difficulty urinating.   Skin: Negative for itching.  Neurological: Negative for dizziness.  Psychiatric/Behavioral: Negative for confusion. The patient is not nervous/anxious.     No Known Allergies  Patient Active Problem List   Diagnosis Date Noted   Anemia due to chronic blood loss    Abnormal LFTs    Dehydration    Thrombocytopenia (HCC)    Acute renal failure (HCC)    Colitis 04/16/2019   Hypokalemia 04/14/2019     Past Medical History:  Diagnosis Date   Hypertension      Past Surgical History:  Procedure Laterality Date   ABDOMINAL HYSTERECTOMY     ESOPHAGOGASTRODUODENOSCOPY N/A 04/25/2019   Procedure: ESOPHAGOGASTRODUODENOSCOPY (EGD);  Surgeon: Toney Reil, MD;  Location: [facility] ENDOSCOPY;  Service: Gastroenterology;  Laterality: N/A;   TEMPORARY DIALYSIS CATHETER N/A 04/21/2019   Procedure: TEMPORARY DIALYSIS CATHETER;  Surgeon: Annice Needy, MD;  Location: [facility] INVASIVE CV LAB;  Service: Cardiovascular;  Laterality: N/A;    Social History   Socioeconomic History    Marital status: Married    Spouse name: Not on file   Number of children: Not on file   Years of education: Not on file   Highest education level: Not on file  Occupational History   Not on file  Social Needs   Financial resource strain: Not on file   Food insecurity:    Worry: Not on file    Inability: Not on file   Transportation needs:    Medical: Not on file    Non-medical: Not on file  Tobacco Use   Smoking status: Never Smoker   Smokeless tobacco: Never Used  Substance and Sexual Activity   Alcohol use: Not Currently   Drug use: Not Currently   Sexual activity: Not on file  Lifestyle   Physical activity:    Days per week: Not on file    Minutes per session: Not on file   Stress: Not on file  Relationships   Social connections:    Talks on phone: Not on file    Gets together: Not on file    Attends religious service: Not on file    Active member of club or organization: Not on file    Attends meetings of clubs or organizations: Not on file    Relationship status: Not on file   Intimate partner violence:    Fear of current or ex partner: Not on file    Emotionally abused: Not on file    Physically abused: Not on file    Forced sexual activity: Not on file  Other Topics Concern   Not on file  Social History Narrative   Not on file     History reviewed. No pertinent family history.   Current Facility-Administered Medications:    0.9 %  sodium chloride infusion, , Intravenous, PRN, Annice Needyew, Jason S, MD, Last Rate: 0 mL/hr at 04/20/19 1419   aspirin chewable tablet 81 mg, 81 mg, Oral, Daily, Dew, Marlow BaarsJason S, MD, 81 mg at 04/25/19 1408   ciprofloxacin (CIPRO) tablet 500 mg, 500 mg, Oral, Daily, Dew, Marlow BaarsJason S, MD, 500 mg at 04/26/19 0920   feeding supplement (BOOST / RESOURCE BREEZE) liquid 1 Container, 1 Container, Oral, TID BM, Gouru, Aruna, MD, 1 Container at 04/25/19 1516   hydrALAZINE (APRESOLINE) injection 10 mg, 10 mg, Intravenous, Q6H PRN,  Gouru, Aruna, MD, 10 mg at 04/26/19 0545   meclizine (ANTIVERT) tablet 12.5 mg, 12.5 mg, Oral, TID PRN, Wyn Quakerew, Marlow BaarsJason S, MD   multivitamin (RENA-VIT) tablet 1 tablet, 1 tablet, Oral, QHS, Gouru, Aruna, MD   ondansetron (ZOFRAN) tablet 4 mg, 4 mg, Oral, Q6H PRN **OR** ondansetron (ZOFRAN) injection 4 mg, 4 mg, Intravenous, Q6H PRN, Wyn Quakerew, Marlow BaarsJason S, MD, 4 mg at 04/23/19 1108   pantoprazole (PROTONIX) injection 40 mg, 40 mg, Intravenous, Q12H, Vanga, Loel Dubonnetohini Reddy, MD, 40 mg at 04/26/19 0920   polyethylene glycol powder (GLYCOLAX/MIRALAX) container 255 g, 1 Container, Oral, Once, Vanga, Loel Dubonnetohini Reddy, MD   senna-docusate (Senokot-S) tablet 1 tablet, 1 tablet, Oral, QHS PRN, Wyn Quakerew, Marlow BaarsJason S, MD   sodium chloride 0.9 % bolus 1,000 mL, 1,000 mL, Intravenous, Once, Dew, Marlow BaarsJason S, MD   sodium chloride flush (NS) 0.9 % injection 10-40 mL, 10-40 mL, Intracatheter, Q12H, Dew, Marlow BaarsJason S, MD, 10 mL at 04/26/19 [id number]   sodium chloride flush (NS) 0.9 % injection 10-40 mL, 10-40 mL, Intracatheter, PRN, Annice Needyew, Jason S, MD, 20 mL at 04/23/19 1724   sodium chloride flush (NS) 0.9 % injection 3 mL, 3 mL, Intravenous, PRN, Gouru, Aruna, MD   venlafaxine XR (EFFEXOR-XR) 24 hr capsule 37.5 mg, 37.5 mg, Oral, Q breakfast, Dew, Marlow BaarsJason S, MD, 37.5 mg at 04/26/19 0920   Physical exam:  Vitals:   04/26/19 1407 04/26/19 1445 04/26/19 1551 04/26/19 1630  BP: (!) 188/90 (!) 175/90 (!) 127/115 (!) 183/90  Pulse: 67 65 66 69  Resp: (!) 21 18 18 18   Temp:  98.3 F (36.8 C) 98.1 F (36.7 C) 98.1 F (36.7 C)  TempSrc:  Oral Oral Oral  SpO2: 100% 98% 99% 97%  Weight:      Height:       Physical Exam  Constitutional: No distress.  HENT:  Head: Normocephalic and atraumatic.  Eyes: Pupils are equal, round, and reactive to light.  Cardiovascular: Normal rate.  Pulmonary/Chest: Effort normal. No respiratory distress.  Musculoskeletal:        General: Edema present.     Comments: Anasarca 2+ edema in all 4 extremities.    Neurological: She is alert.  Skin: Skin is dry.  Multiple tattoos on bilateral upper extremities  Psychiatric: Affect normal.       CMP Latest Ref Rng & Units 04/26/2019  Glucose 70 - 99 mg/dL 960(A130(H)  BUN 8 - 23 mg/dL 54(U55(H)  Creatinine 9.810.44 - 1.00 mg/dL 1.91(Y3.80(H)  Sodium 782135 - 956145 mmol/L 145  Potassium 3.5 - 5.1 mmol/L 3.3(L)  Chloride 98 - 111 mmol/L 108  CO2 22 - 32 mmol/L 26  Calcium 8.9 - 10.3 mg/dL 7.9(L)  Total Protein 6.5 - 8.1 g/dL -  Total Bilirubin 0.3 - 1.2 mg/dL -  Alkaline Phos 38 - 126 U/L -  AST 15 - 41 U/L -  ALT 0 - 44 U/L -   CBC Latest Ref Rng & Units 04/26/2019  WBC 4.0 - 10.5 K/uL 8.5  Hemoglobin 12.0 - 15.0 g/dL 6.9(L)  Hematocrit 36.0 - 46.0 % 21.6(L)  Platelets 150 - 400 K/uL 124(L)   RADIOGRAPHIC STUDIES: I have personally reviewed the radiological images as listed and agreed with the findings in the report.  Ct Abdomen Pelvis Wo Contrast  Result Date: 04/15/2019 CLINICAL DATA:  Nausea and vomiting, history of hypertension, dizziness. EXAM: CT ABDOMEN AND PELVIS WITHOUT CONTRAST TECHNIQUE: Multidetector CT imaging of the abdomen and pelvis was performed following the standard protocol without IV contrast. COMPARISON:  None. FINDINGS: Lower chest: No acute abnormality. Hepatobiliary: No focal liver abnormality is seen. No gallstones, gallbladder wall thickening, or biliary dilatation. Pancreas: Unremarkable. No pancreatic ductal dilatation or surrounding inflammatory changes. Spleen: Normal in size without focal abnormality. Adrenals/Urinary Tract: Adrenal glands appear normal. Kidneys are unremarkable without mass, stone or hydronephrosis. No perinephric fluid. No ureteral or bladder calculi identified. Bladder appears normal, partially decompressed. Stomach/Bowel: No dilated large or small bowel loops. Questionable thickening of the walls of the RIGHT colon and transverse colon with mild pericolonic inflammation suggesting acute colitis. Appendix is normal.  Stomach is unremarkable, partially decompressed. Vascular/Lymphatic: Aortic atherosclerosis. No enlarged lymph nodes seen. Reproductive: Presumed hysterectomy. No adnexal mass or free fluid seen. Other: No free fluid or abscess collection. No free intraperitoneal air. Musculoskeletal: No acute or suspicious osseous finding. Fixation hardware at the L3 through L5 levels appears intact and appropriately position. IMPRESSION: 1. Suspected mild colitis of the transverse colon and RIGHT colon. 2. Remainder of the abdomen and pelvis CT is unremarkable. No bowel obstruction. No free fluid or abscess collection. No evidence of acute solid organ abnormality. Appendix is normal. Aortic Atherosclerosis (ICD10-I70.0). Electronically Signed   By: Bary Richard M.D.   On: 04/15/2019 11:53   Dg Abd 1 View  Result Date: 04/18/2019 CLINICAL DATA:  Right femoral line placement EXAM: ABDOMEN - 1 VIEW COMPARISON:  Dominant CT 3 days ago FINDINGS: Right femoral line. The tip is obscured by lumbar fusion hardware but is likely at the level of the iliac confluence. Mild gaseous distension of bowel, question mild ileus. Probable anasarca seen at the right flank. IMPRESSION: 1. Femoral line with tip obscured by lumbar fusion hardware. The tip is either at the iliac confluence or IVC. 2. Mild generalized gaseous distension of bowel, there may be developing ileus. Electronically Signed   By: Marnee Spring M.D.   On: 04/18/2019 04:27   Dg Abd 1 View  Result Date: 04/14/2019 CLINICAL DATA:  Nausea and vomiting for 3 days. EXAM: ABDOMEN - 1 VIEW COMPARISON:  Chest radiograph same date. FINDINGS: The bowel gas pattern is normal. There is mildly prominent stool in the distal colon. There is no supine evidence of free intraperitoneal air or suspicious abdominal calcification. There are postsurgical changes in the lumbar spine status post L3 through 5 fusion. No acute osseous findings. IMPRESSION: No acute abdominal findings. Mildly  increased distal colonic stool burden, suggesting constipation. Electronically Signed   By: Carey Bullocks M.D.   On: 04/14/2019 14:32   US Renal  Result Date: 04/17/2019 CLINICAL DATA:  Acute renal failure EXAM: RENAL / URINARY TRACT ULTRASOUND COMPLETE COMPARISON:  CT from 04/15/2019 FINDINGS: Right Kidney: Renal measurements: 9.8 x 4.5 x 4.7 cm. = volume: 107 mL . Echogenicity within normal limits.  No mass or hydronephrosis visualized. Left Kidney: Renal measurements: 9.7 x 5.1 x 5.6 cm. = volume: 145 mL. Echogenicity within normal limits. No mass or hydronephrosis visualized. Bladder: Appears normal for degree of bladder distention. Increased echogenicity of the liver is noted consistent with fatty infiltration. Minimal free fluid is noted adjacent to the liver. IMPRESSION: Kidneys are within normal limits. Fatty infiltration of the liver. Minimal free fluid in the abdomen. Electronically Signed   By: Alcide Clever M.D.   On: 04/17/2019 21:08   Dg Chest Port 1 View  Result Date: 04/14/2019 CLINICAL DATA:  Weakness.  Nausea and vomiting for 3 days. EXAM: PORTABLE CHEST 1 VIEW COMPARISON:  None. FINDINGS: The cardiomediastinal silhouette is within normal limits. The lungs are mildly hypoinflated with mild bronchovascular crowding in the lung bases. No airspace consolidation, edema, pleural effusion, pneumothorax is identified. No acute osseous abnormality is seen. IMPRESSION: No active disease. Electronically Signed   By: Sebastian Ache M.D.   On: 04/14/2019 11:15   US Liver Doppler  Result Date: 04/19/2019 CLINICAL DATA:  73 year old female with liver disease EXAM: DUPLEX ULTRASOUND OF LIVER TECHNIQUE: Color and duplex Doppler ultrasound was performed to evaluate the hepatic in-flow and out-flow vessels. COMPARISON:  None. FINDINGS: Portal Vein Velocities Main:  45 cm/sec Right:  30 cm/sec Left:  23 cm/sec Hepatic Vein Velocities Right:  33 cm/sec Middle:  28 cm/sec Left:  70 cm/sec Hepatic Artery  Velocity:  90 cm/sec Splenic Vein Velocity:  13 cm/sec Varices: Absent Ascites: Ascites present, scant volume. Spleen volume 68 cubic cm. Left-sided pleural effusion. Mildly nodular contour of liver parenchyma with increased echogenicity, and no high-resolution images provided. IMPRESSION: Unremarkable directed duplex of the hepatic vasculature. Mildly nodular contour of the liver suggesting developing cirrhosis, though no high-resolution images were performed. Scant ascites. Electronically Signed   By: Gilmer Mor D.O.   On: 04/19/2019 13:57   [place] Ekg Site Rite  Result Date: 04/26/2019 If Site Rite image not attached, placement could not be confirmed due to current cardiac rhythm.   Assessment and plan-  Patient is a 73 y.o. female who is currently admitted due to hypovolemic shock secondary to severe dehydration/sepsis/colitis.   #Persistent anemia, secondary to blood loss/renal insufficiency. Ferritin has been repeated yesterday, 560. I ordered reticulocyte hemoglobin to further evaluate her iron stores. retic Hg is 33, indicating adequate iron store.  Transfuse if hemoglobin <7.  Previous discussed with Dr.Kolluru, Epo with HD.   #Thrombocytopenia, platelet counts 124,000, # Renal failure, on dialysis, nephrology on board.  # Infectious colitis, continue cipro.  # Nodular contour of liver, ?cirrhosis. Normal Coags.   Thank you for allowing me to participate in the care of this patient.   Rickard Patience, MD, PhD 04/26/2019

## 2019-04-26 NOTE — Progress Notes (Signed)
Spoke with floor RN re: PICC order. RN to call renal MD for clearance. Will follow up.

## 2019-04-26 NOTE — Anesthesia Procedure Notes (Signed)
Date/Time: 04/26/2019 1:30 PM Performed by: Junious Silk, CRNA Pre-anesthesia Checklist: Patient identified, Emergency Drugs available, Suction available, Patient being monitored and Timeout performed Oxygen Delivery Method: Nasal cannula

## 2019-04-26 NOTE — Progress Notes (Signed)
Per Dr Sheryle Hail, ok to give hydralazine for bp of 187/81. Henriette Combs RN

## 2019-04-27 ENCOUNTER — Encounter
Admission: EM | Disposition: A | Discharge status: Home Health | Payer: Self-pay | Source: Home / Self Care | Attending: Internal Medicine

## 2019-04-27 ENCOUNTER — Inpatient Hospital Stay: Payer: Medicare Other | Admitting: Anesthesiology

## 2019-04-27 ENCOUNTER — Encounter: Payer: Self-pay | Admitting: Anesthesiology

## 2019-04-27 HISTORY — PX: COLONOSCOPY: SHX5424

## 2019-04-27 LAB — BASIC METABOLIC PANEL
Anion gap: 11 (ref 5–15)
BUN: 51 mg/dL — ABNORMAL HIGH (ref 8–23)
CO2: 25 mmol/L (ref 22–32)
Calcium: 8.2 mg/dL — ABNORMAL LOW (ref 8.9–10.3)
Chloride: 110 mmol/L (ref 98–111)
Creatinine, Ser: 3.22 mg/dL — ABNORMAL HIGH (ref 0.44–1.00)
GFR calc Af Amer: 16 mL/min — ABNORMAL LOW (ref >60)
GFR calc non Af Amer: 14 mL/min — ABNORMAL LOW (ref >60)
Glucose, Bld: 123 mg/dL — ABNORMAL HIGH (ref 70–99)
Potassium: 3.5 mmol/L (ref 3.5–5.1)
Sodium: 146 mmol/L — ABNORMAL HIGH (ref 135–145)

## 2019-04-27 LAB — EBV AB TO VIRAL CAPSID AG PNL, IGG+IGM
EBV VCA IgG: 114 U/mL — ABNORMAL HIGH (ref 0.0–17.9)
EBV VCA IgM: <36 U/mL (ref 0.0–35.9)

## 2019-04-27 LAB — HEMOGLOBIN AND HEMATOCRIT, BLOOD
HCT: 24.4 % — ABNORMAL LOW (ref 36.0–46.0)
Hemoglobin: 8 g/dL — ABNORMAL LOW (ref 12.0–15.0)

## 2019-04-27 LAB — HEPATITIS B E ANTIGEN: Hep B E Ag: NEGATIVE

## 2019-04-27 SURGERY — COLONOSCOPY
Anesthesia: General

## 2019-04-27 MED ORDER — SODIUM CHLORIDE 0.9 % IV SOLN
INTRAVENOUS | Status: DC
  Administered 2019-04-27: 500 mL via INTRAVENOUS

## 2019-04-27 MED ORDER — TORSEMIDE 20 MG PO TABS
40.0000 mg | ORAL_TABLET | Freq: Every day | ORAL | Status: DC
  Administered 2019-04-27 – 2019-04-28 (×2): 40 mg via ORAL
  Filled 2019-04-27 (×2): qty 2

## 2019-04-27 MED ORDER — EPOETIN ALFA 10000 UNIT/ML IJ SOLN
10000.0000 [IU] | INTRAMUSCULAR | Status: DC
  Administered 2019-04-27: 19:00:00 10000 [IU] via SUBCUTANEOUS
  Filled 2019-04-27: qty 1

## 2019-04-27 MED ORDER — POTASSIUM CHLORIDE CRYS ER 20 MEQ PO TBCR
20.0000 meq | EXTENDED_RELEASE_TABLET | Freq: Once | ORAL | Status: AC
  Administered 2019-04-27: 19:00:00 20 meq via ORAL
  Filled 2019-04-27: qty 1

## 2019-04-27 MED ORDER — PROPOFOL 10 MG/ML IV BOLUS
INTRAVENOUS | Status: DC | PRN
  Administered 2019-04-27: 40 mg via INTRAVENOUS

## 2019-04-27 MED ORDER — URSODIOL 300 MG PO CAPS
300.0000 mg | ORAL_CAPSULE | Freq: Two times a day (BID) | ORAL | Status: DC
  Administered 2019-04-27 – 2019-04-30 (×6): 300 mg via ORAL
  Filled 2019-04-27 (×7): qty 1

## 2019-04-27 MED ORDER — PROPOFOL 500 MG/50ML IV EMUL
INTRAVENOUS | Status: DC | PRN
  Administered 2019-04-27: 125 ug/kg/min via INTRAVENOUS

## 2019-04-27 NOTE — Progress Notes (Signed)
Pharmacy Electrolyte Monitoring Consult:  Pharmacy consulted to assist in monitoring and replacing electrolytes in this 73 y.o. female admitted on 04/14/2019 with Nausea and Emesis   Labs:  Sodium (mmol/L)  Date Value  04/26/2019 145  12/25/2014 141   Potassium (mmol/L)  Date Value  04/26/2019 3.3 (L)  12/25/2014 3.5   Magnesium (mg/dL)  Date Value  17/71/1657 1.8   Phosphorus (mg/dL)  Date Value  90/38/3338 3.0   Calcium (mg/dL)  Date Value  32/91/9166 7.9 (L)   Calcium, Total (mg/dL)  Date Value  06/00/4599 9.2   Albumin (g/dL)  Date Value  77/41/4239 3.1 (L)    Assessment/Plan: Patient in acute renal failure, started on HD on 5/30  5/31 @1721  K 3.4  - Received KCl po 6/1 @ 0453 K 3.3, Mg 1.8  6/2 @ 0359 K 3.8, Mg 1.8 6/3 @ 0543 K 3.3 6/4 @ 0414 K 3.5  - Will give Kcl po x1 and recheck potassium with am labs  Will need to be cautious with supplementation due to renal fucntion.  Pharmacy will continue to monitor and adjust per consult.   Albina Billet, PharmD, BCPS Clinical Pharmacist 04/27/2019 7:40 AM

## 2019-04-27 NOTE — Progress Notes (Signed)
Patient ID: Sarah Sullivan Stiner, female   DOB: 05-25-1946, 73 y.o.   MRN: [id number]  [facility] PROGRESS NOTE  Sarah Sullivan [place] WGN:[id number] DOB: 05-25-1946 DOA: 04/14/2019 PCP: Titus MouldWhite, Elizabeth Burney, NP  HPI/Subjective: Patient did not get her colonoscopy yesterday as her: Was not completely clean . Awaiting colonoscopy today.  Feels much better patient had temporary hemodialysis catheter placement 04/21/2019 nausea but no vomiting.  Abdominal pain is less.    Objective: Vitals:   04/27/19 0447 04/27/19 1131  BP: (!) 175/82 (!) 194/78  Pulse: 62   Resp: 16   Temp: 97.9 F (36.6 C)   SpO2: 97%     Filed Weights   04/21/19 1532 04/26/19 1234  Weight: 57.4 kg 57.4 kg    ROS: Review of Systems  Constitutional: Negative for chills and fever.  Eyes: Negative for blurred vision.  Respiratory: Negative for cough and shortness of breath.   Cardiovascular: Negative for chest pain.  Gastrointestinal: Negative for abdominal pain, constipation, diarrhea, nausea and vomiting.  Genitourinary: Negative for dysuria.  Musculoskeletal: Negative for joint pain.  Neurological: Negative for dizziness and headaches.   Exam: Physical Exam  Constitutional: She is oriented to person, place, and time.  HENT:  Nose: No mucosal edema.  Mouth/Throat: No oropharyngeal exudate or posterior oropharyngeal edema.  Eyes: Pupils are equal, round, and reactive to light. Conjunctivae, EOM and lids are normal.  Neck: No JVD present. Carotid bruit is not present. No edema present. No thyroid mass and no thyromegaly present.  Cardiovascular: S1 normal and S2 normal. Exam reveals no gallop.  No murmur heard. Pulses:      Dorsalis pedis pulses are 2+ on the right side and 2+ on the left side.  Respiratory: No respiratory distress. She has decreased breath sounds in the right lower field and the left lower field. She has no wheezes. She has no rhonchi. She has no rales.  GI: Soft. Bowel sounds are normal. There  is no abdominal tenderness.  Left groin with temporary TLC catheter, no active bleeding  Musculoskeletal:     Right ankle: She exhibits no swelling.     Left ankle: She exhibits no swelling.  Lymphadenopathy:    She has no cervical adenopathy.  Neurological: She is alert and oriented to person, place, and time. No cranial nerve deficit.  Skin: Skin is warm. No rash noted. Nails show no clubbing.  Psychiatric: She has a normal mood and affect.      Data Reviewed: Basic Metabolic Panel: Recent Labs  Lab 04/23/19 0414 04/23/19 1721 04/24/19 0453 04/25/19 0359 04/26/19 0543 04/27/19 0414  NA 143  --  144 144 145 146*  K 2.8* 3.4* 3.3* 3.8 3.3* 3.5  CL 102  --  105 107 108 110  CO2 28  --  29 27 26 25   GLUCOSE 109*  --  88 143* 130* 123*  BUN 53*  --  52* 58* 55* 51*  CREATININE 4.10*  --  4.49* 4.25* 3.80* 3.22*  CALCIUM 7.8*  --  8.0* 8.1* 7.9* 8.2*  MG 1.8  --  1.8 1.8  --   --    Liver Function Tests: Recent Labs  Lab 04/21/19 0352 04/22/19 0245  AST 113* 146*  ALT 105* 108*  ALKPHOS 663* 672*  BILITOT 1.0 0.9  PROT 5.1* 5.5*  ALBUMIN 2.5* 3.1*   No results for input(s): LIPASE, AMYLASE in the last 168 hours. CBC: Recent Labs  Lab 04/21/19 0352 04/22/19 0245 04/24/19 0453 04/24/19 1913 04/25/19 0359  04/25/19 1755 04/25/19 2254 04/26/19 0543 04/27/19 0411  WBC 15.4* 14.3* 10.8* 7.4 7.6  --   --  8.5  --   NEUTROABS 9.9*  --   --   --   --   --   --   --   --   HGB 9.7* 8.8* 7.4* 8.2* 7.3* 7.6* 7.4* 6.9* 8.0*  HCT 27.3* 25.9* 22.6* 24.7* 22.3* 23.0* 22.5* 21.6* 24.4*  MCV 85.8 89.9 90.4 92.5 93.3  --   --  95.2  --   PLT 152 151 132* 126* 119*  --   --  124*  --    Cardiac Enzymes: No results for input(s): CKTOTAL, CKMB, CKMBINDEX, TROPONINI in the last 168 hours.   Recent Results (from the past 240 hour(s))  Gastrointestinal Panel by PCR , Stool     Status: Abnormal   Collection Time: 04/17/19 10:42 PM  Result Value Ref Range Status    Campylobacter species NOT DETECTED NOT DETECTED Final   Plesimonas shigelloides NOT DETECTED NOT DETECTED Final   Salmonella species NOT DETECTED NOT DETECTED Final   Yersinia enterocolitica NOT DETECTED NOT DETECTED Final   Vibrio species NOT DETECTED NOT DETECTED Final   Vibrio cholerae NOT DETECTED NOT DETECTED Final   Enteroaggregative E coli (EAEC) NOT DETECTED NOT DETECTED Final   Enteropathogenic E coli (EPEC) DETECTED (A) NOT DETECTED Final    Comment: RESULT CALLED TO, READ BACK BY AND VERIFIED WITH: BETH BUONO AT 0211 04/19/2019 SDR    Enterotoxigenic E coli (ETEC) NOT DETECTED NOT DETECTED Final   Shiga like toxin producing E coli (STEC) NOT DETECTED NOT DETECTED Final   Shigella/Enteroinvasive E coli (EIEC) NOT DETECTED NOT DETECTED Final   Cryptosporidium NOT DETECTED NOT DETECTED Final   Cyclospora cayetanensis NOT DETECTED NOT DETECTED Final   Entamoeba histolytica NOT DETECTED NOT DETECTED Final   Giardia lamblia NOT DETECTED NOT DETECTED Final   Adenovirus F40/41 NOT DETECTED NOT DETECTED Final   Astrovirus NOT DETECTED NOT DETECTED Final   Norovirus GI/GII NOT DETECTED NOT DETECTED Final   Rotavirus A NOT DETECTED NOT DETECTED Final   Sapovirus (I, II, IV, and V) NOT DETECTED NOT DETECTED Final    Comment: Performed at [facility], [address] [place], [place] [address]  C difficile quick scan w PCR reflex     Status: None   Collection Time: 04/18/19 10:42 PM  Result Value Ref Range Status   C Diff antigen NEGATIVE NEGATIVE Final   C Diff toxin NEGATIVE NEGATIVE Final   C Diff interpretation No C. difficile detected.  Final    Comment: Performed at [facility], [address] [place], [place] [address]  Novel Coronavirus, NAA (hospital order; send-out to ref lab)     Status: None   Collection Time: 04/25/19  5:04 PM  Result Value Ref Range Status   SARS-CoV-2, NAA NOT DETECTED NOT DETECTED Final    Comment: (NOTE) This test was developed  and its performance characteristics determined by [employer]. This test has not been FDA cleared or approved. This test has been authorized by FDA under an Emergency Use Authorization (EUA). This test is only authorized for the duration of time the declaration that circumstances exist justifying the authorization of the emergency use of in vitro diagnostic tests for detection of SARS-CoV-2 virus and/or diagnosis of COVID-19 infection under section 564(b)(1) of the Act, 21 U.S.C. 098JXB-1(Y)(7), unless the authorization is terminated or revoked sooner. When diagnostic testing is negative,  the possibility of a false negative result should be considered in the context of a patient's recent exposures and the presence of clinical signs and symptoms consistent with COVID-19. An individual without symptoms of COVID-19 and who is not shedding SARS-CoV-2 virus would expect to have a negative (not detected) result in this assay. Performed  At: [facility] [address] [place], [place] [id number] Jolene Schimke MD NW:[id number]    Coronavirus Source NASOPHARYNGEAL  Final    Comment: Performed at [facility], [address] [place], [place] [address]      Scheduled Meds: . [MAR Hold] aspirin  81 mg Oral Daily  . [MAR Hold] ciprofloxacin  500 mg Oral Daily  . [MAR Hold] epoetin (EPOGEN/PROCRIT) injection  10,000 Units Subcutaneous Weekly  . [MAR Hold] feeding supplement  1 Container Oral TID BM  . [MAR Hold] multivitamin  1 tablet Oral QHS  . [MAR Hold] pantoprazole (PROTONIX) IV  40 mg Intravenous Q12H  . [MAR Hold] potassium chloride  20 mEq Oral Once  . [MAR Hold] sodium chloride flush  10-40 mL Intracatheter Q12H  . [MAR Hold] torsemide  40 mg Oral Daily  . [MAR Hold] venlafaxine XR  37.5 mg Oral Q breakfast   Continuous Infusions: . [MAR Hold] sodium chloride 0  (04/20/19 1419)  . [MAR Hold] sodium chloride      Assessment/Plan:  1. Bloody diarrhea  -EGD on 04/26/2019 has revealed multiple nonbleeding gastric and duodenal ulcers with no perforation .recommending PPI 40 mg twice daily for 3 months and follow-up for repeat EGD at GI office.  Strictly no NSAIDs . Scheduled for colonoscopy todayby Dr. Allegra Lai I appreciate her recommendations monitor CBC  2. acute kidney injury.  Likely secondary to hypovolemic shock and GI losses.  Case discussed with nephrology and temporary dialysis catheter placed on 04/21/2019 groin.  Patient had hemodialysis on 04/22/2019.  Clinically improving nephrology is not considering any more dialysis and okay to discharge patient from nephrology standpoint  3. Symptomatic anemia- 1 u prbc given during the hospital course hemoglobin is stable, monitor closely and transfuse as needed 4. hypovolemic shock.  Resolved holding antihypertensive medications.  Solu-Cortef to 25 mg twice a day. changed to p.o. prednisone and tapered off which might improve her nausea and peptic ulcer disease 5. 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  Continue Cipro.  GI is following no new recommendations at this time 6. Atrial fibrillation with rapid ventricular response converted over to normal sinus rhythm with amiodarone.  Now off amiodarone.  Patient is been in sinus bradycardia at this point. 7. Thrombocytopenia continue to monitor blood counts.-Secondary to severe sepsis resolved smear does show some red blood cell fragments.  Platelet count back to normal  8. Elevated liver function test.  Likely with hypotension.  Continue to monitor.  Hepatitis panel negative.  Ultrasound concerning for early cirrhosis even though CAT scan did not comment on this.  GI is following.  Repeat a.m. labs 9. LE edema- third spacing, albumin 3.1, will supplement with nepro , dietary seeing pt , supplements  10. DVT prophylaxis with Lovenox held in view of symptomatic anemia and GI bleed.  SCDs 11. Generalized weakness PT assessment  Code  Status:     Code Status Orders  (From admission, onward)         Start     Ordered   04/14/19 1533  Full code  Continuous     04/14/19 1532  Code Status History    This patient has a current code status but no historical code status.     Family Communication: Call placed to husband for update and left VM to call back on 6/2 , 6/3  Disposition Plan: To be determined  Consultants:  Critical care specialist  Gastroenterology  Nephrology  Oncology  Time spent: 33 minutes.  Spoke with nephrology  [facility]  [facility]

## 2019-04-27 NOTE — Anesthesia Post-op Follow-up Note (Signed)
Anesthesia QCDR form completed.        

## 2019-04-27 NOTE — Anesthesia Preprocedure Evaluation (Addendum)
Anesthesia Evaluation  Patient identified by MRN, date of birth, ID band Patient awake    Reviewed: Allergy & Precautions, H&P , NPO status , Patient's Chart, lab work & pertinent test results  Airway Mallampati: II  TM Distance: >3 FB Neck ROM: full    Dental  (+) Teeth Intact   Pulmonary neg pulmonary ROS, neg shortness of breath, neg COPD, neg recent URI,           Cardiovascular hypertension, (-) Past MI negative cardio ROS  (-) dysrhythmias      Neuro/Psych negative neurological ROS  negative psych ROS   GI/Hepatic negative GI ROS, Neg liver ROS,   Endo/Other  negative endocrine ROS  Renal/GU ARFRenal disease  negative genitourinary   Musculoskeletal   Abdominal   Peds  Hematology  (+) Blood dyscrasia, anemia ,   Anesthesia Other Findings Past Medical History: No date: Anemia No date: Hypertension  Past Surgical History: No date: ABDOMINAL HYSTERECTOMY 04/26/2019: COLONOSCOPY; N/A     Comment:  Procedure: COLONOSCOPY;  Surgeon: Toney Reil,               MD;  Location: [facility] ENDOSCOPY;  Service:               Gastroenterology;  Laterality: N/A; 04/25/2019: ESOPHAGOGASTRODUODENOSCOPY; N/A     Comment:  Procedure: ESOPHAGOGASTRODUODENOSCOPY (EGD);  Surgeon:               Toney Reil, MD;  Location: [facility] ENDOSCOPY;                Service: Gastroenterology;  Laterality: N/A; 04/26/2019: ESOPHAGOGASTRODUODENOSCOPY; N/A     Comment:  Procedure: ESOPHAGOGASTRODUODENOSCOPY (EGD);  Surgeon:               Toney Reil, MD;  Location: [facility] ENDOSCOPY;                Service: Gastroenterology;  Laterality: N/A; 04/21/2019: TEMPORARY DIALYSIS CATHETER; N/A     Comment:  Procedure: TEMPORARY DIALYSIS CATHETER;  Surgeon: Annice Needy, MD;  Location: [facility] INVASIVE CV LAB;  Service:               Cardiovascular;  Laterality: N/A;  BMI    Body Mass Index:  25.56 kg/m      Reproductive/Obstetrics negative OB ROS                            Anesthesia Physical Anesthesia Plan  ASA: III  Anesthesia Plan: General   Post-op Pain Management:    Induction:   PONV Risk Score and Plan: Propofol infusion and TIVA  Airway Management Planned: Nasal Cannula and Natural Airway  Additional Equipment:   Intra-op Plan:   Post-operative Plan:   Informed Consent: I have reviewed the patients History and Physical, chart, labs and discussed the procedure including the risks, benefits and alternatives for the proposed anesthesia with the patient or authorized representative who has indicated his/her understanding and acceptance.     Dental Advisory Given  Plan Discussed with: Anesthesiologist and CRNA  Anesthesia Plan Comments:         Anesthesia Quick Evaluation

## 2019-04-27 NOTE — Op Note (Signed)
[facility] Gastroenterology Patient Name: Sarah Sullivan Procedure Date: 04/27/2019 4:13 PM MRN: [id number] Account #: [id number] Date of Birth: February 08, 1946 Admit Type: Inpatient Age: 73 Room: [facility] ENDO ROOM 4 Gender: Female Note Status: Finalized Procedure:            Colonoscopy Indications:          Abnormal CT of the GI tract Providers:            Lin Landsman MD, MD Referring MD:         Ricardo Jericho, NP Medicines:            Monitored Anesthesia Care Complications:        No immediate complications. Estimated blood loss: None. Procedure:            Pre-Anesthesia Assessment:                       - Prior to the procedure, a History and Physical was                        performed, and patient medications and allergies were                        reviewed. The patient is competent. The risks and                        benefits of the procedure and the sedation options and                        risks were discussed with the patient. All questions                        were answered and informed consent was obtained.                        Patient identification and proposed procedure were                        verified by the physician, the nurse, the                        anesthesiologist, the anesthetist and the technician in                        the pre-procedure area in the procedure room in the                        endoscopy suite. Mental Status Examination: alert and                        oriented. Airway Examination: normal oropharyngeal                        airway and neck mobility. Respiratory Examination:                        clear to auscultation. CV Examination: normal.                        Prophylactic Antibiotics: The patient does not require  prophylactic antibiotics. Prior Anticoagulants: The                        patient has taken no previous anticoagulant or   antiplatelet agents. ASA Grade Assessment: III - A                        patient with severe systemic disease. After reviewing                        the risks and benefits, the patient was deemed in                        satisfactory condition to undergo the procedure. The                        anesthesia plan was to use monitored anesthesia care                        (MAC). Immediately prior to administration of                        medications, the patient was re-assessed for adequacy                        to receive sedatives. The heart rate, respiratory rate,                        oxygen saturations, blood pressure, adequacy of                        pulmonary ventilation, and response to care were                        monitored throughout the procedure. The physical status                        of the patient was re-assessed after the procedure.                       After obtaining informed consent, the colonoscope was                        passed under direct vision. Throughout the procedure,                        the patient's blood pressure, pulse, and oxygen                        saturations were monitored continuously. The was                        introduced through the anus and advanced to the the                        terminal ileum, with identification of the appendiceal                        orifice and IC valve. The colonoscopy was performed  without difficulty. The patient tolerated the procedure                        well. The quality of the bowel preparation was fair. Findings:      The terminal ileum appeared normal.      Normal mucosa was found in the entire colon.      Non-bleeding external and internal hemorrhoids were found during       retroflexion and during perianal exam. The hemorrhoids were large. Impression:           - Preparation of the colon was fair.                       - The examined portion of the ileum was  normal.                       - Normal mucosa in the entire examined colon.                       - Non-bleeding external and internal hemorrhoids.                       - No specimens collected. Recommendation:       - Return patient to hospital ward for ongoing care.                       - Resume regular diet today.                       - Continue present medications. Procedure Code(s):    --- Professional ---                       [phone], Colonoscopy, flexible; diagnostic, including                        collection of specimen(s) by brushing or washing, when                        performed (separate procedure) Diagnosis Code(s):    --- Professional ---                       K64.8, Other hemorrhoids                       R93.3, Abnormal findings on diagnostic imaging of other                        parts of digestive tract CPT copyright 2019 American Medical Association. All rights reserved. The codes documented in this report are preliminary and upon coder review may  be revised to meet current compliance requirements. Dr. Ulyess Mort Lin Landsman MD, MD 04/27/2019 4:49:30 PM This report has been signed electronically. Number of Addenda: 0 Note Initiated On: 04/27/2019 4:13 PM Scope Withdrawal Time: 0 hours 5 minutes 22 seconds  Total Procedure Duration: 0 hours 9 minutes 21 seconds       [facility]

## 2019-04-27 NOTE — Transfer of Care (Signed)
Immediate Anesthesia Transfer of Care Note  Patient: Sarah Sullivan  Procedure(s) Performed: COLONOSCOPY (N/A )  Patient Location: PACU  Anesthesia Type:General  Level of Consciousness: sedated  Airway & Oxygen Therapy: Patient Spontanous Breathing and Patient connected to nasal cannula oxygen  Post-op Assessment: Report given to RN and Post -op Vital signs reviewed and stable  Post vital signs: Reviewed and stable  Last Vitals:  Vitals Value Taken Time  BP    Temp    Pulse    Resp    SpO2      Last Pain:  Vitals:   04/27/19 1504  TempSrc: Tympanic  PainSc: 0-No pain      Patients Stated Pain Goal: 0 (04/27/19 1504)  Complications: No apparent anesthesia complications

## 2019-04-27 NOTE — Anesthesia Procedure Notes (Signed)
Date/Time: 04/27/2019 4:25 PM Performed by: Junious Silk, CRNA Pre-anesthesia Checklist: Patient identified, Emergency Drugs available, Suction available, Patient being monitored and Timeout performed Oxygen Delivery Method: Nasal cannula

## 2019-04-27 NOTE — Progress Notes (Signed)
PT Cancellation Note  Patient Details Name: Allien Bearup MRN: [id number] DOB: [birth year]   Cancelled Treatment:    Reason Eval/Treat Not Completed: Patient at procedure or test/unavailable Pt is out of room for colonoscopy.  Will try back when appropriate.  Malachi Pro, DPT 04/27/2019, 3:34 PM

## 2019-04-27 NOTE — Progress Notes (Signed)
[facility]  ROUNDING NOTE   Subjective:   Denies acute c/o  Reports b/l LE edema in immproving No SOB -Scheduled for colonoscopy today Serum creatinine improving without dialysis  Objective:  Vital signs in last 24 hours:  Temp:  [97.8 F (36.6 C)-98.4 F (36.9 C)] 97.9 F (36.6 C) (06/04 0447) Pulse Rate:  [62-89] 62 (06/04 0447) Resp:  [14-21] 16 (06/04 0447) BP: (127-205)/(82-115) 175/82 (06/04 0447) SpO2:  [97 %-100 %] 97 % (06/04 0447) Weight:  [57.4 kg] 57.4 kg (06/03 1234)  Weight change:  Filed Weights   04/21/19 1532 04/26/19 1234  Weight: 57.4 kg 57.4 kg    Intake/Output: I/O last 3 completed shifts: In: 420 [Blood:420] Out: -    Intake/Output this shift:  No intake/output data recorded.  Physical Exam: General: NAD,   Head: Normocephalic, atraumatic. Moist oral mucosal membranes  Eyes: Anicteric,    Neck: Supple, trachea midline  Lungs:  Decreased sounds at bases  Heart: irregular  Abdomen:  Mild distension, nontender  Extremities: ++ peripheral edema.  Neurologic: Nonfocal, moving all four extremities  Skin: No lesions   Access: Left femoral temp HD catheter 5/29 Dr. Wyn Quaker    Basic Metabolic Panel: Recent Labs  Lab 04/23/19 0414 04/23/19 1721 04/24/19 0453 04/25/19 0359 04/26/19 0543 04/27/19 0414  NA 143  --  144 144 145 146*  K 2.8* 3.4* 3.3* 3.8 3.3* 3.5  CL 102  --  105 107 108 110  CO2 28  --  29 27 26 25   GLUCOSE 109*  --  88 143* 130* 123*  BUN 53*  --  52* 58* 55* 51*  CREATININE 4.10*  --  4.49* 4.25* 3.80* 3.22*  CALCIUM 7.8*  --  8.0* 8.1* 7.9* 8.2*  MG 1.8  --  1.8 1.8  --   --     Liver Function Tests: Recent Labs  Lab 04/21/19 0352 04/22/19 0245  AST 113* 146*  ALT 105* 108*  ALKPHOS 663* 672*  BILITOT 1.0 0.9  PROT 5.1* 5.5*  ALBUMIN 2.5* 3.1*   No results for input(s): LIPASE, AMYLASE in the last 168 hours. No results for input(s): AMMONIA in the last 168 hours.  CBC: Recent Labs  Lab  04/21/19 0352 04/22/19 0245 04/24/19 0453 04/24/19 1913 04/25/19 0359 04/25/19 1755 04/25/19 2254 04/26/19 0543 04/27/19 0411  WBC 15.4* 14.3* 10.8* 7.4 7.6  --   --  8.5  --   NEUTROABS 9.9*  --   --   --   --   --   --   --   --   HGB 9.7* 8.8* 7.4* 8.2* 7.3* 7.6* 7.4* 6.9* 8.0*  HCT 27.3* 25.9* 22.6* 24.7* 22.3* 23.0* 22.5* 21.6* 24.4*  MCV 85.8 89.9 90.4 92.5 93.3  --   --  95.2  --   PLT 152 151 132* 126* 119*  --   --  124*  --     Cardiac Enzymes: No results for input(s): CKTOTAL, CKMB, CKMBINDEX, TROPONINI in the last 168 hours.  BNP: Invalid input(s): POCBNP  CBG: No results for input(s): GLUCAP in the last 168 hours.  Microbiology: Results for orders placed or performed during the hospital encounter of 04/14/19  SARS Coronavirus 2 (CEPHEID - Performed in [facility] hospital lab), Hosp Order     Status: None   Collection Time: 04/14/19 10:32 AM  Result Value Ref Range Status   SARS Coronavirus 2 NEGATIVE NEGATIVE Final    Comment: (NOTE) If result is  NEGATIVE SARS-CoV-2 target nucleic acids are NOT DETECTED. The SARS-CoV-2 RNA is generally detectable in upper and lower  respiratory specimens during the acute phase of infection. The lowest  concentration of SARS-CoV-2 viral copies this assay can detect is 250  copies / mL. A negative result does not preclude SARS-CoV-2 infection  and should not be used as the sole basis for treatment or other  patient management decisions.  A negative result may occur with  improper specimen collection / handling, submission of specimen other  than nasopharyngeal swab, presence of viral mutation(s) within the  areas targeted by this assay, and inadequate number of viral copies  (<250 copies / mL). A negative result must be combined with clinical  observations, patient history, and epidemiological information. If result is POSITIVE SARS-CoV-2 target nucleic acids are DETECTED. The SARS-CoV-2 RNA is generally detectable in  upper and lower  respiratory specimens dur ing the acute phase of infection.  Positive  results are indicative of active infection with SARS-CoV-2.  Clinical  correlation with patient history and other diagnostic information is  necessary to determine patient infection status.  Positive results do  not rule out bacterial infection or co-infection with other viruses. If result is PRESUMPTIVE POSTIVE SARS-CoV-2 nucleic acids MAY BE PRESENT.   A presumptive positive result was obtained on the submitted specimen  and confirmed on repeat testing.  While 2019 novel coronavirus  (SARS-CoV-2) nucleic acids may be present in the submitted sample  additional confirmatory testing may be necessary for epidemiological  and / or clinical management purposes  to differentiate between  SARS-CoV-2 and other Sarbecovirus currently known to infect humans.  If clinically indicated additional testing with an alternate test  methodology [phone]) is advised. The SARS-CoV-2 RNA is generally  detectable in upper and lower respiratory sp ecimens during the acute  phase of infection. The expected result is Negative. Fact Sheet for Patients:  BoilerBrush.com.cy Fact Sheet for Healthcare Providers: https://pope.com/ This test is not yet approved or cleared by the [place] FDA and has been authorized for detection and/or diagnosis of SARS-CoV-2 by FDA under an Emergency Use Authorization (EUA).  This EUA will remain in effect (meaning this test can be used) for the duration of the COVID-19 declaration under Section 564(b)(1) of the Act, 21 U.S.C. section 360bbb-3(b)(1), unless the authorization is terminated or revoked sooner. Performed at [facility], [address] [place], [place] [address]   MRSA PCR Screening     Status: None   Collection Time: 04/14/19  7:22 PM  Result Value Ref Range Status   MRSA by PCR NEGATIVE NEGATIVE Final     Comment:        The GeneXpert MRSA Assay (FDA approved for NASAL specimens only), is one component of a comprehensive MRSA colonization surveillance program. It is not intended to diagnose MRSA infection nor to guide or monitor treatment for MRSA infections. Performed at [facility], [address] [place], [place] [address]   CULTURE, BLOOD (ROUTINE X 2) w Reflex to ID Panel     Status: None   Collection Time: 04/16/19  7:47 AM  Result Value Ref Range Status   Specimen Description BLOOD BLOOD RIGHT HAND  Final   Special Requests   Final    BOTTLES DRAWN AEROBIC AND ANAEROBIC Blood Culture adequate volume   Culture   Final    NO GROWTH 5 DAYS Performed at [facility], [address], [place], [place] [address]    Report Status  04/21/2019 FINAL  Final  CULTURE, BLOOD (ROUTINE X 2) w Reflex to ID Panel     Status: None   Collection Time: 04/16/19  9:27 AM  Result Value Ref Range Status   Specimen Description BLOOD BLOOD RIGHT HAND  Final   Special Requests   Final    BOTTLES DRAWN AEROBIC ONLY Blood Culture adequate volume   Culture   Final    NO GROWTH 5 DAYS Performed at [facility], [address] [place], [place] [address]    Report Status 04/21/2019 FINAL  Final  MRSA PCR Screening     Status: None   Collection Time: 04/17/19  1:47 PM  Result Value Ref Range Status   MRSA by PCR NEGATIVE NEGATIVE Final    Comment:        The GeneXpert MRSA Assay (FDA approved for NASAL specimens only), is one component of a comprehensive MRSA colonization surveillance program. It is not intended to diagnose MRSA infection nor to guide or monitor treatment for MRSA infections. Performed at [facility], [address] [place], [place] [address]   Gastrointestinal Panel by PCR , Stool     Status: Abnormal   Collection Time: 04/17/19 10:42 PM  Result Value Ref Range Status   Campylobacter species NOT DETECTED NOT DETECTED Final    Plesimonas shigelloides NOT DETECTED NOT DETECTED Final   Salmonella species NOT DETECTED NOT DETECTED Final   Yersinia enterocolitica NOT DETECTED NOT DETECTED Final   Vibrio species NOT DETECTED NOT DETECTED Final   Vibrio cholerae NOT DETECTED NOT DETECTED Final   Enteroaggregative E coli (EAEC) NOT DETECTED NOT DETECTED Final   Enteropathogenic E coli (EPEC) DETECTED (A) NOT DETECTED Final    Comment: RESULT CALLED TO, READ BACK BY AND VERIFIED WITH: BETH BUONO AT 0211 04/19/2019 SDR    Enterotoxigenic E coli (ETEC) NOT DETECTED NOT DETECTED Final   Shiga like toxin producing E coli (STEC) NOT DETECTED NOT DETECTED Final   Shigella/Enteroinvasive E coli (EIEC) NOT DETECTED NOT DETECTED Final   Cryptosporidium NOT DETECTED NOT DETECTED Final   Cyclospora cayetanensis NOT DETECTED NOT DETECTED Final   Entamoeba histolytica NOT DETECTED NOT DETECTED Final   Giardia lamblia NOT DETECTED NOT DETECTED Final   Adenovirus F40/41 NOT DETECTED NOT DETECTED Final   Astrovirus NOT DETECTED NOT DETECTED Final   Norovirus GI/GII NOT DETECTED NOT DETECTED Final   Rotavirus A NOT DETECTED NOT DETECTED Final   Sapovirus (I, II, IV, and V) NOT DETECTED NOT DETECTED Final    Comment: Performed at [facility], [address] [place], [place] [address]  C difficile quick scan w PCR reflex     Status: None   Collection Time: 04/18/19 10:42 PM  Result Value Ref Range Status   C Diff antigen NEGATIVE NEGATIVE Final   C Diff toxin NEGATIVE NEGATIVE Final   C Diff interpretation No C. difficile detected.  Final    Comment: Performed at [facility], [address] [place], [place] [address]  Novel Coronavirus, NAA (hospital order; send-out to ref lab)     Status: None   Collection Time: 04/25/19  5:04 PM  Result Value Ref Range Status   SARS-CoV-2, NAA NOT DETECTED NOT DETECTED Final    Comment: (NOTE) This test was developed and its performance characteristics determined by  [employer]. This test has not been FDA cleared or approved. This test has been authorized by FDA under an Emergency Use Authorization (EUA). This test is  only authorized for the duration of time the declaration that circumstances exist justifying the authorization of the emergency use of in vitro diagnostic tests for detection of SARS-CoV-2 virus and/or diagnosis of COVID-19 infection under section 564(b)(1) of the Act, 21 U.S.C. 161WRU-0(A)(5360bbb-3(b)(1), unless the authorization is terminated or revoked sooner. When diagnostic testing is negative, the possibility of a false negative result should be considered in the context of a patient's recent exposures and the presence of clinical signs and symptoms consistent with COVID-19. An individual without symptoms of COVID-19 and who is not shedding SARS-CoV-2 virus would expect to have a negative (not detected) result in this assay. Performed  At: [facility] [address] [place], [place] [id number] Jolene SchimkeNagendra Sanjai MD NW:[phone]    Coronavirus Source NASOPHARYNGEAL  Final    Comment: Performed at [facility], [address] [place], [place] [address]    Coagulation Studies: No results for input(s): LABPROT, INR in the last 72 hours.  Urinalysis: No results for input(s): COLORURINE, LABSPEC, PHURINE, GLUCOSEU, HGBUR, BILIRUBINUR, KETONESUR, PROTEINUR, UROBILINOGEN, NITRITE, LEUKOCYTESUR in the last 72 hours.  Invalid input(s): APPERANCEUR    Imaging: [employer] Site Rite  Result Date: 04/26/2019 If Site Rite image not attached, placement could not be confirmed due to current cardiac rhythm.    Medications:   . sodium chloride 0  (04/20/19 1419)  . sodium chloride     . aspirin  81 mg Oral Daily  . ciprofloxacin  500 mg Oral Daily  . feeding supplement  1 Container Oral TID BM  . multivitamin  1 tablet Oral QHS  . pantoprazole (PROTONIX) IV  40 mg Intravenous Q12H  . sodium chloride flush  10-40 mL  Intracatheter Q12H  . venlafaxine XR  37.5 mg Oral Q breakfast   sodium chloride, hydrALAZINE, meclizine, ondansetron **OR** ondansetron (ZOFRAN) IV, senna-docusate, sodium chloride flush, sodium chloride flush  Assessment/ Plan:  Ms. Sarah Sullivan is a 73 y.o. Asian female with a PMHx of hypertension, degenerative disc disease, lumbar radiculitis, history of depression, osteopenia, who was [facility] for hypovolemic shock with E. Coli colitis.   1.  Acute renal failure with metabolic acidosis: secondary to prerenal azotemia, GI losses and blood losses. ATN. Nonoliguric urine output. Hematuria and proteinuria on admission.  Creatinine baseline of 1, GFR of 56 on 03/27/19. Consistent with chronic kidney disease stage III. Renal imaging- CT abdomen Abd/pelvis- 5/23 was unremarkable except for colitis -No further need for hemodialysis is anticipated -May remove dialysis catheter when there is no need for central line  2. LE edema - 3rd spacing and renal failure - monitor UOP   -Start oral diuretic  3. Anemia with renal failure: status post 1 unit PRBC 5/26. With thrombocytopenia Appreciate Hematology input - EPO SQ  4. Colitis:Enteropathogenic E coli (EPEC) - ciprofloxacin.  - colonoscopy scheduled today   LOS: 11 Lynelle Weiler 6/4/202011:18 AM

## 2019-04-28 ENCOUNTER — Inpatient Hospital Stay: Payer: Medicare Other

## 2019-04-28 ENCOUNTER — Encounter: Payer: Self-pay | Admitting: Gastroenterology

## 2019-04-28 DIAGNOSIS — K279 Peptic ulcer, site unspecified, unspecified as acute or chronic, without hemorrhage or perforation: Secondary | ICD-10-CM

## 2019-04-28 DIAGNOSIS — G459 Transient cerebral ischemic attack, unspecified: Secondary | ICD-10-CM

## 2019-04-28 DIAGNOSIS — E876 Hypokalemia: Secondary | ICD-10-CM

## 2019-04-28 DIAGNOSIS — K743 Primary biliary cirrhosis: Secondary | ICD-10-CM

## 2019-04-28 DIAGNOSIS — N171 Acute kidney failure with acute cortical necrosis: Secondary | ICD-10-CM

## 2019-04-28 LAB — COMPREHENSIVE METABOLIC PANEL
ALT: 58 U/L — ABNORMAL HIGH (ref 0–44)
AST: 40 U/L (ref 15–41)
Albumin: 2.9 g/dL — ABNORMAL LOW (ref 3.5–5.0)
Alkaline Phosphatase: 307 U/L — ABNORMAL HIGH (ref 38–126)
Anion gap: 11 (ref 5–15)
BUN: 50 mg/dL — ABNORMAL HIGH (ref 8–23)
CO2: 26 mmol/L (ref 22–32)
Calcium: 8.2 mg/dL — ABNORMAL LOW (ref 8.9–10.3)
Chloride: 109 mmol/L (ref 98–111)
Creatinine, Ser: 2.97 mg/dL — ABNORMAL HIGH (ref 0.44–1.00)
GFR calc Af Amer: 17 mL/min — ABNORMAL LOW (ref >60)
GFR calc non Af Amer: 15 mL/min — ABNORMAL LOW (ref >60)
Glucose, Bld: 100 mg/dL — ABNORMAL HIGH (ref 70–99)
Potassium: 3 mmol/L — ABNORMAL LOW (ref 3.5–5.1)
Sodium: 146 mmol/L — ABNORMAL HIGH (ref 135–145)
Total Bilirubin: 0.8 mg/dL (ref 0.3–1.2)
Total Protein: 6 g/dL — ABNORMAL LOW (ref 6.5–8.1)

## 2019-04-28 LAB — GLUCOSE, CAPILLARY: Glucose-Capillary: 112 mg/dL — ABNORMAL HIGH (ref 70–99)

## 2019-04-28 LAB — BPAM RBC
Blood Product Expiration Date: 202006262359
ISSUE DATE / TIME: 202006031601
Unit Type and Rh: 7300

## 2019-04-28 LAB — TYPE AND SCREEN
ABO/RH(D): B POS
Antibody Screen: NEGATIVE
Unit division: 0

## 2019-04-28 LAB — CBC
HCT: 25.8 % — ABNORMAL LOW (ref 36.0–46.0)
Hemoglobin: 8.4 g/dL — ABNORMAL LOW (ref 12.0–15.0)
MCH: 30.7 pg (ref 26.0–34.0)
MCHC: 32.6 g/dL (ref 30.0–36.0)
MCV: 94.2 fL (ref 80.0–100.0)
Platelets: 120 10*3/uL — ABNORMAL LOW (ref 150–400)
RBC: 2.74 MIL/uL — ABNORMAL LOW (ref 3.87–5.11)
RDW: 16.6 % — ABNORMAL HIGH (ref 11.5–15.5)
WBC: 8.6 10*3/uL (ref 4.0–10.5)
nRBC: 0 % (ref 0.0–0.2)

## 2019-04-28 LAB — MAGNESIUM: Magnesium: 1.6 mg/dL — ABNORMAL LOW (ref 1.7–2.4)

## 2019-04-28 LAB — SURGICAL PATHOLOGY

## 2019-04-28 IMAGING — MR MRI HEAD WITHOUT CONTRAST
10 of 13 series · 32 of 48 positions shown · non-contrast
Comparison: CT head earlier today was normal.

CLINICAL DATA: 72-year-old female admitted with gastrointestinal
bleed found to be hypertensive, with paroxysmal atrial fibrillation,
developed lethargy and RIGHT facial droop.

EXAM:
MRI HEAD WITHOUT CONTRAST
MRA HEAD WITHOUT CONTRAST
TECHNIQUE: Multiplanar, multiecho pulse sequences of the brain and surrounding
structures were obtained without intravenous contrast. Angiographic
images of the head were obtained using MRA technique without
contrast.

[Series 5: ax dwi_tracew · axial · 3.0mm · 0.60mm/px · z∈[-41,+121]mm · 4 of 55 slices shown]
[im 1/55]
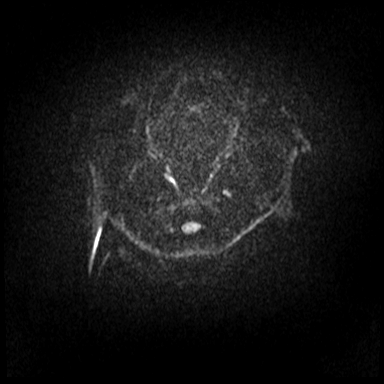
[im 19/55]
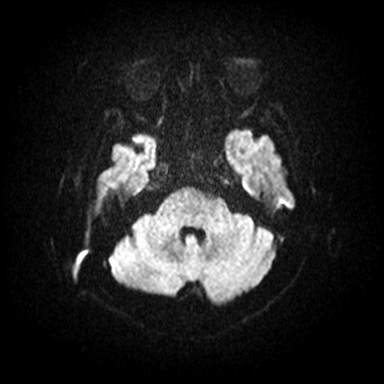
[im 37/55]
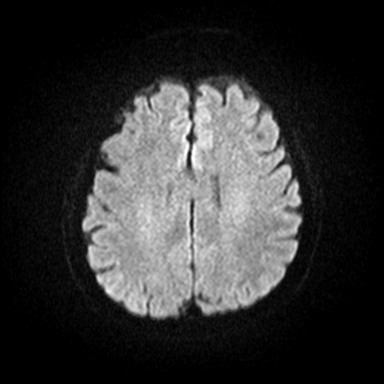
[im 55/55]
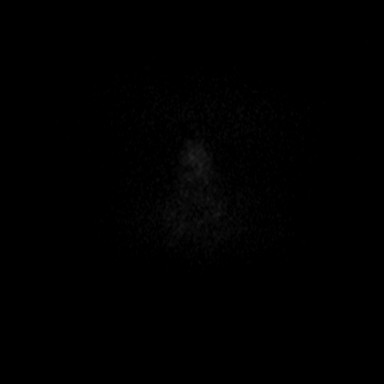

[Series 6: ax dwi_adc · axial · 3.0mm · 0.60mm/px · z∈[-41,+121]mm · 3 of 55 slices shown]
[im 1/55]
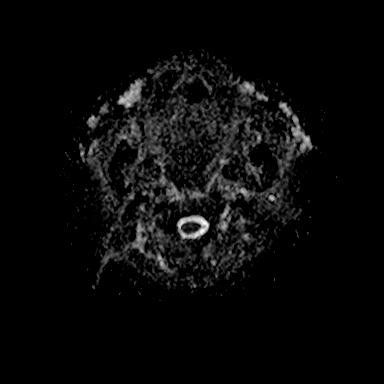
[im 28/55]
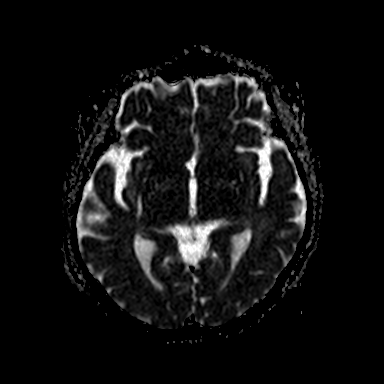
[im 55/55]
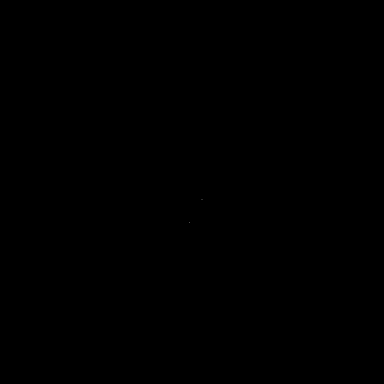

[Series 9: cor dwi_tracew · coronal · 5.0mm · 0.60mm/px · 2 of 31 slices shown]
[im 1/31]
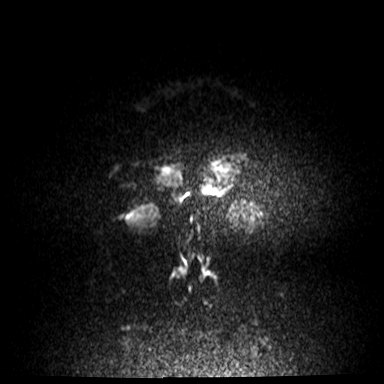
[im 31/31]
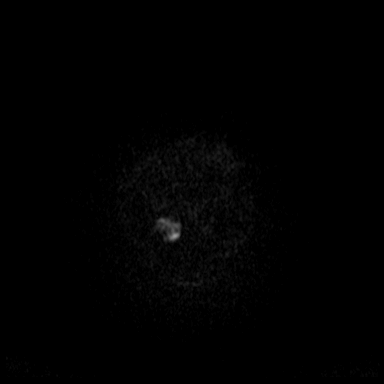

[Series 10: cor dwi_adc · coronal · 5.0mm · 0.60mm/px · 2 of 31 slices shown]
[im 1/31]
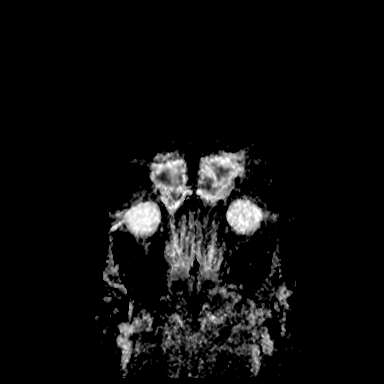
[im 31/31]
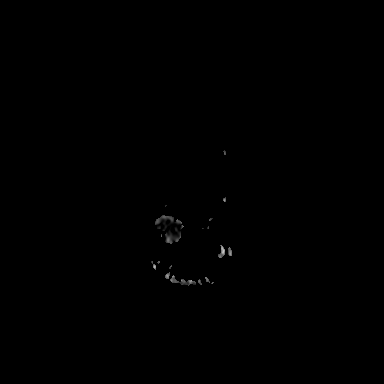

[Series 11: TOF · axial · 0.5mm · 0.41mm/px · z∈[-30,+41]mm · 6 of 205 slices shown]
[im 1/205]
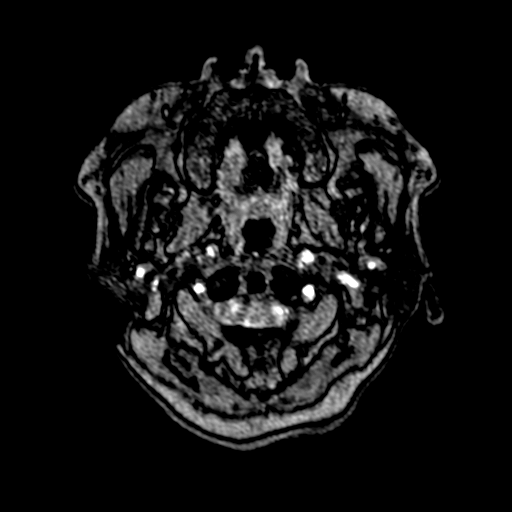
[im 41/205]
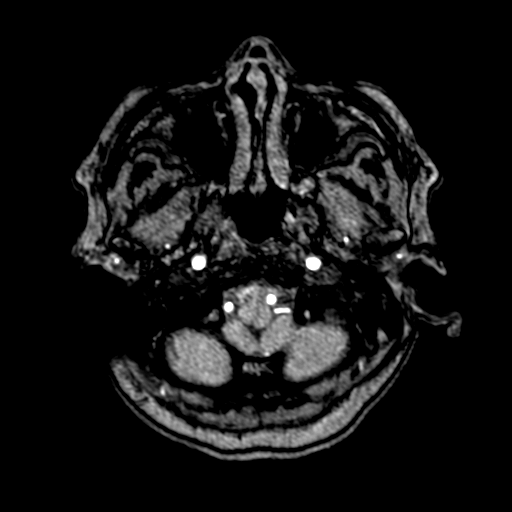
[im 62/205]
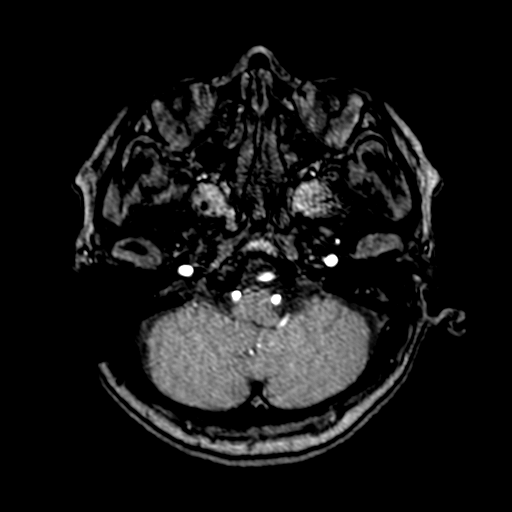
[im 82/205]
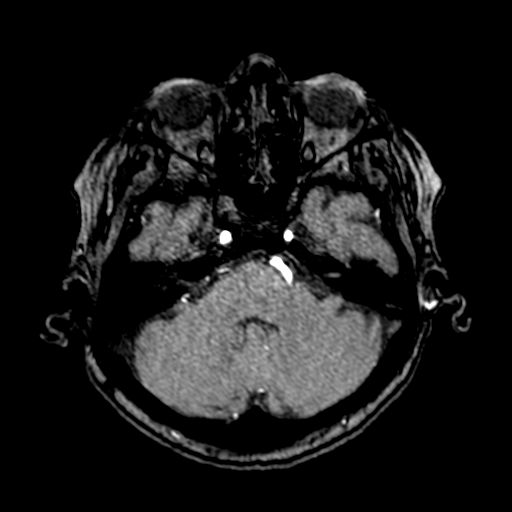
[im 123/205]
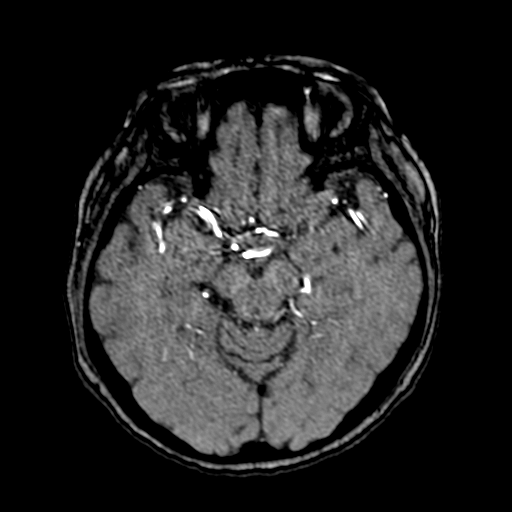
[im 143/205]
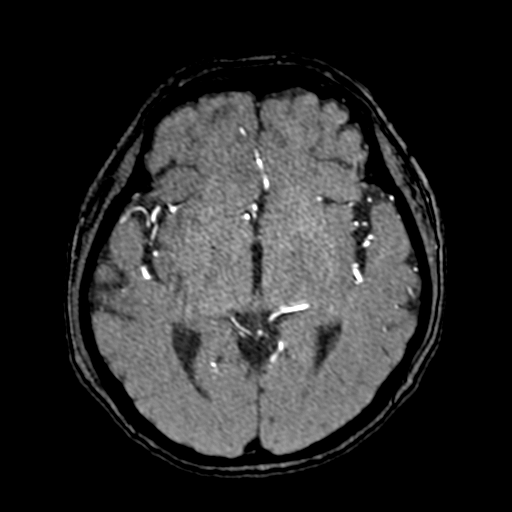

[Series 16: T1 · sagittal · 5.0mm · 0.62mm/px · 1 of 23 slices shown (1 of 2)]
[im 1/23]
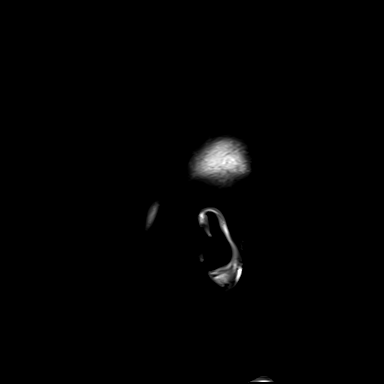

[Series 17: T2 · axial · 5.0mm · 0.53mm/px · 1 of 25 slices shown (1 of 2)]
[im 1/25]
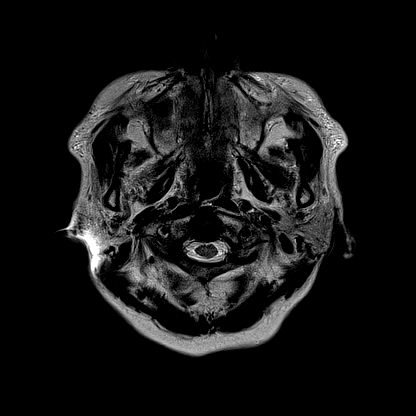

[Series 22: FLAIR · axial · 3.0mm · 0.53mm/px · z∈[-35,+126]mm · 3 of 55 slices shown]
[im 1/55]
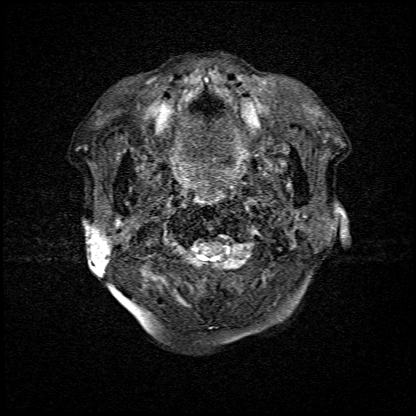
[im 28/55]
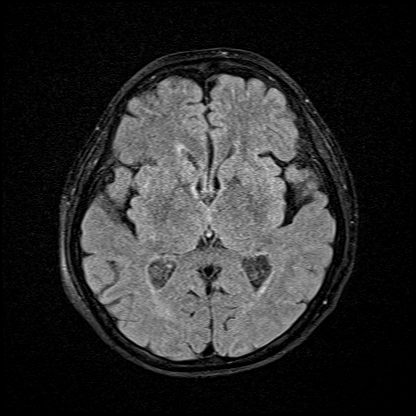
[im 55/55]
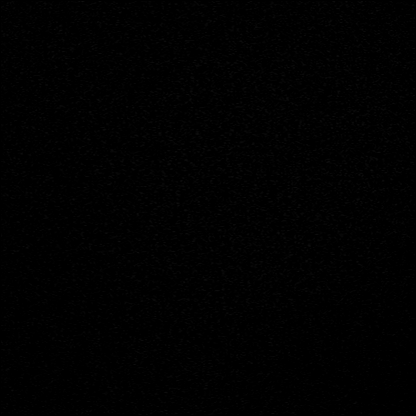

[Series 23: T1 · axial · 1.0mm · 0.98mm/px · z∈[-47,+127]mm · 8 of 173 slices shown (2 of 2)]
[im 1/173]
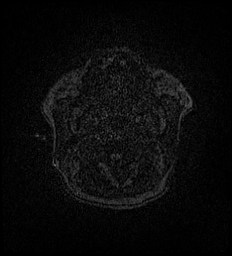
[im 20/173]
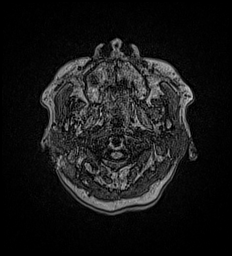
[im 58/173]
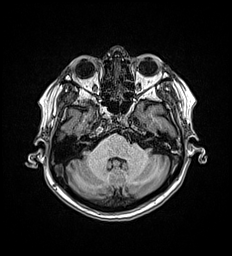
[im 77/173]
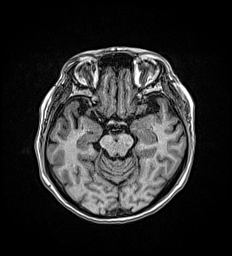
[im 96/173]
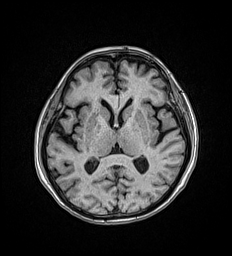
[im 115/173]
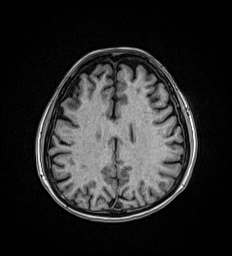
[im 153/173]
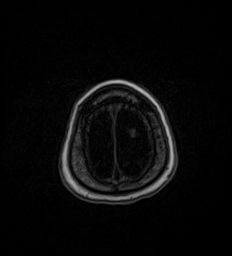
[im 173/173]
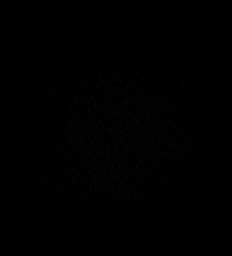

[Series 24: T2 · coronal · 5.0mm · 0.57mm/px · 2 of 29 slices shown (2 of 2)]
[im 1/29]
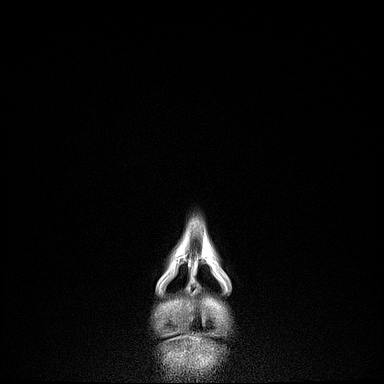
[im 29/29]
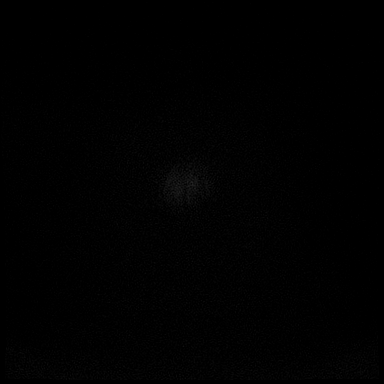

[32 of 48 positions shown; findings below may reference images not displayed]

FINDINGS: MRI HEAD FINDINGS

Brain: No evidence for acute infarction, hemorrhage, mass lesion,
hydrocephalus, or extra-axial fluid. Mild atrophy, not necessarily
unexpected for age. Mild subcortical and periventricular T2 and
FLAIR hyperintensities, likely chronic microvascular ischemic
change.

Vascular: Reported separately.

Skull and upper cervical spine: No acute findings.

Sinuses/Orbits: BILATERAL cataract extraction. No layering fluid.

Other: None

MRA HEAD FINDINGS

Internal carotid arteries demonstrate symmetric size and signal. No
flow-limiting stenosis across the upper cervical, skull base, and
supraclinoid segments.

Basilar artery is widely patent with vertebrals codominant. No
posterior circulation stenosis.

There is a high-grade stenosis of the LEFT M1 middle cerebral artery
in its distal aspect, thread-like lumen, with diminutive flow
related enhancement in the more distal LEFT M2 and M3 branches.
There is mild irregularity of the LEFT A1 ACA, non worrisome given
in the dominant RIGHT A1 ACA and patent anterior communicating
artery. RIGHT M1 MCA unremarkable. Both posterior cerebral arteries
are patent and there is no visible cerebellar branch occlusion. No
visible saccular aneurysm.
IMPRESSION: No acute intracranial findings. Specifically no acute stroke. Mild
atrophy and small vessel disease.

High-grade stenosis of the distal LEFT M1 MCA, although patency is
established.

Neuro-Interventional Radiology consultation is suggested to evaluate
the appropriateness of potential treatment. Non-emergent evaluation
can be arranged by calling [[email or web address]] during usual hours.
Emergency evaluation can be requested by paging [[email or web address]].

## 2019-04-28 IMAGING — CT CT HEAD CODE STROKE W/O CM
3 series · 16 of 46 positions shown, 19 images · non-contrast
Comparison: None.

CLINICAL DATA: Code stroke. 72-year-old female with right facial
droop and altered mental status.

EXAM:
CT HEAD WITHOUT CONTRAST
TECHNIQUE: Contiguous axial images were obtained from the base of the skull
through the vertex without intravenous contrast.

[Series 2: head wo · axial · 0.40mm/px · z∈[-119,+1]mm · 10 of 29 slices shown, 13 images]
[im 3/29  brain]
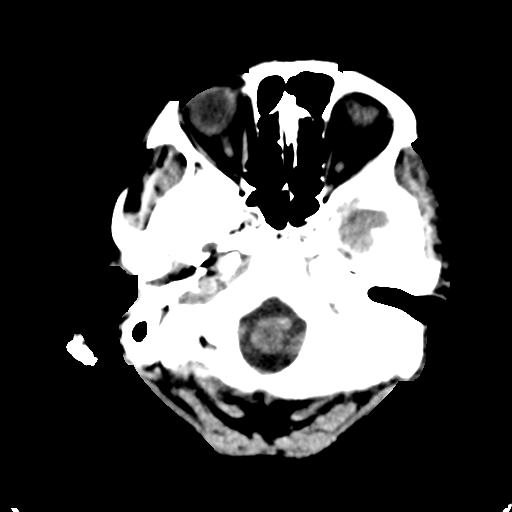
[im 3/29  bone]
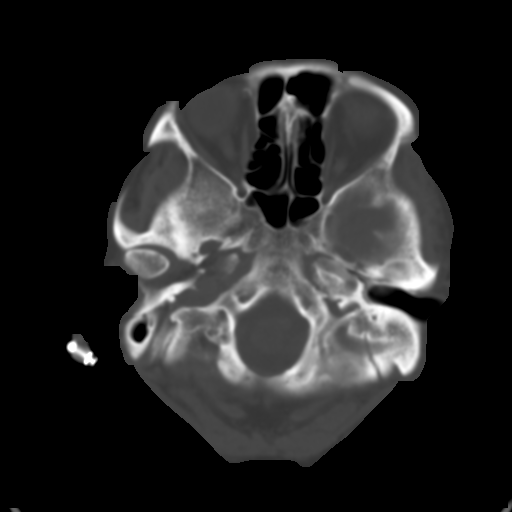
[im 6/29  brain]
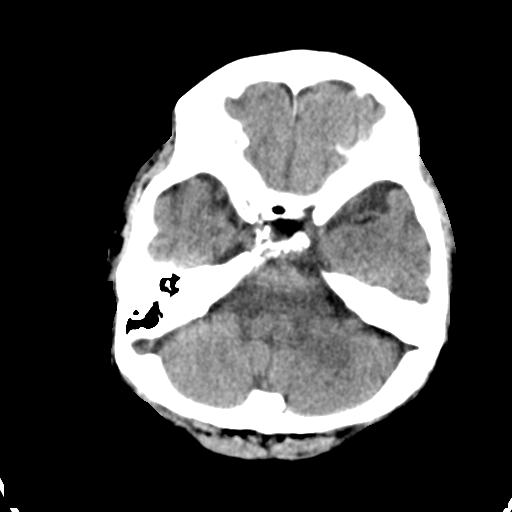
[im 8/29  brain]
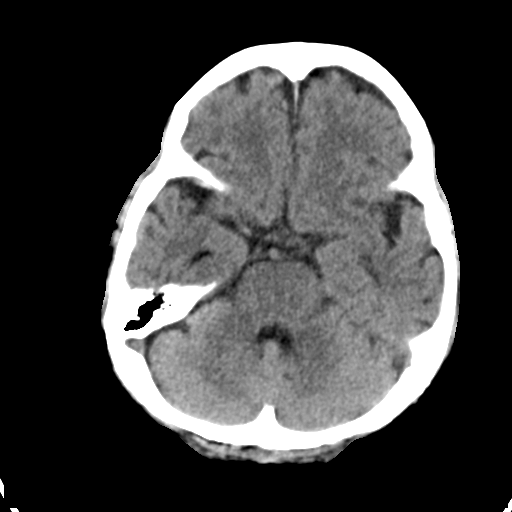
[im 11/29  brain]
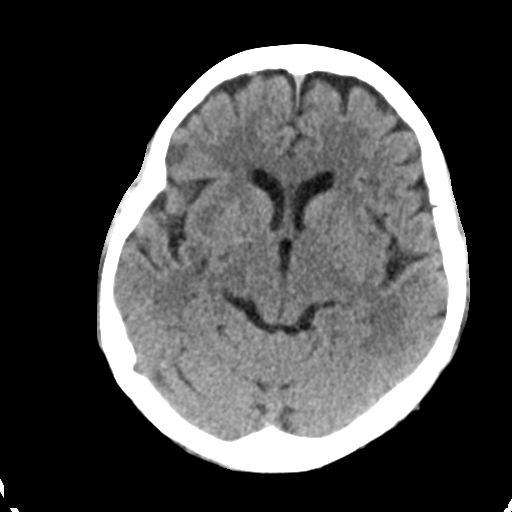
[im 14/29  brain]
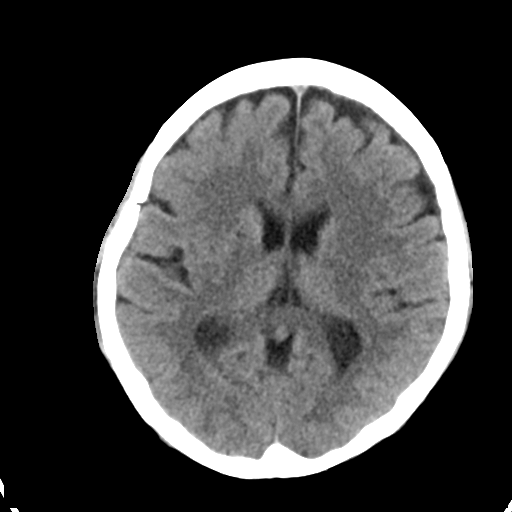
[im 14/29  bone]
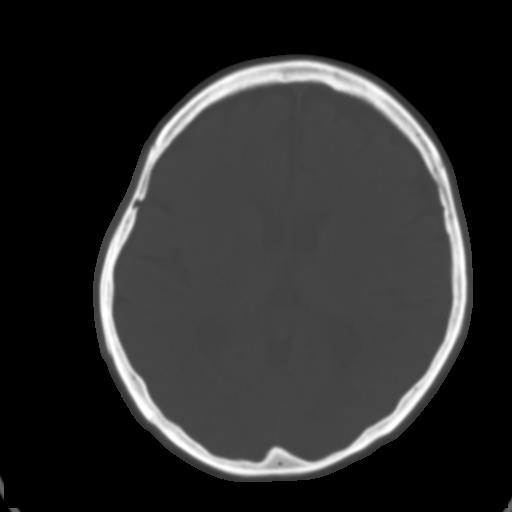
[im 16/29  brain]
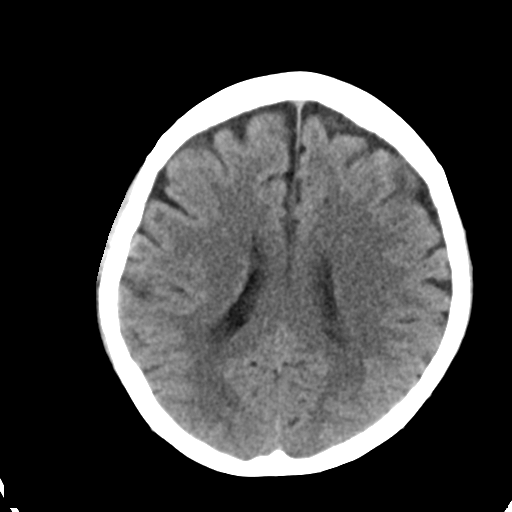
[im 19/29  brain]
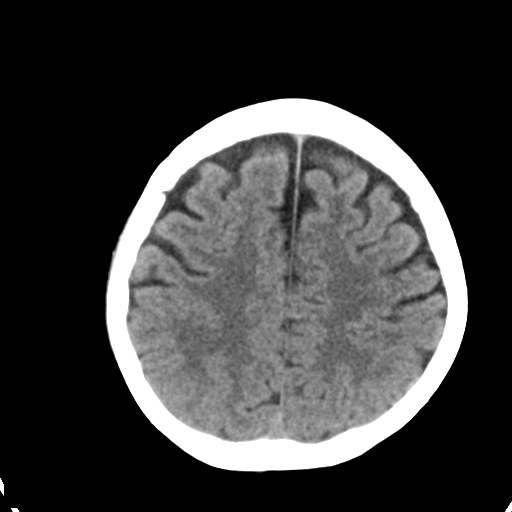
[im 22/29  brain]
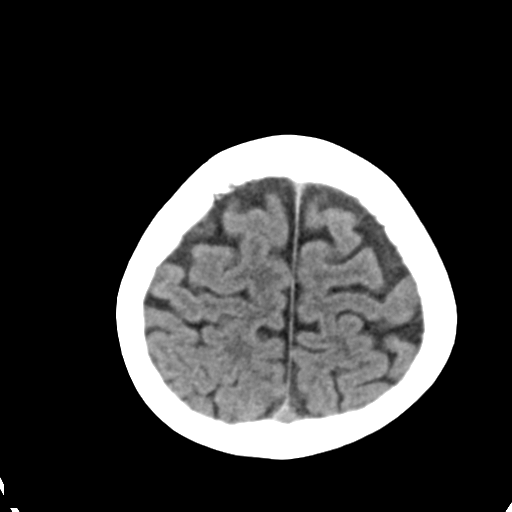
[im 24/29  brain]
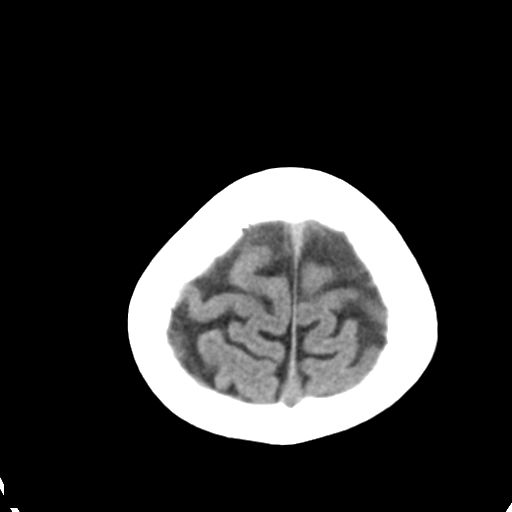
[im 24/29  bone]
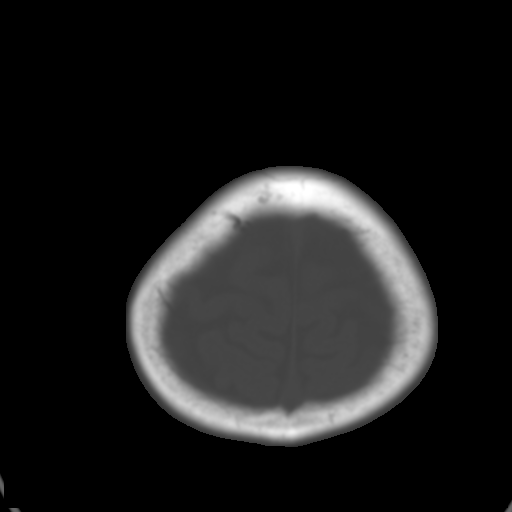
[im 27/29  brain]
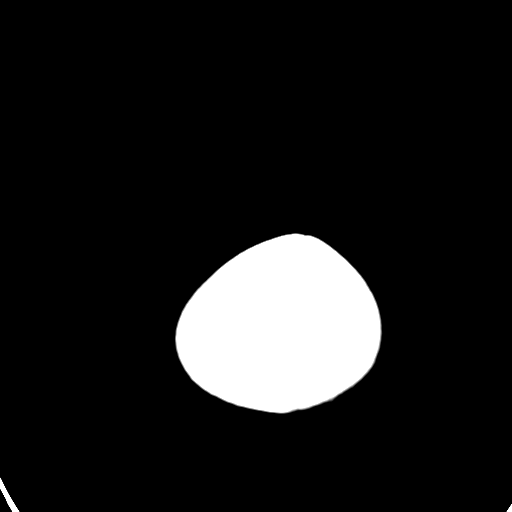

[Series 4: coronal soft tissue · coronal · 0.32mm/px · 3 of 58 slices shown]
[im 20/58  brain]
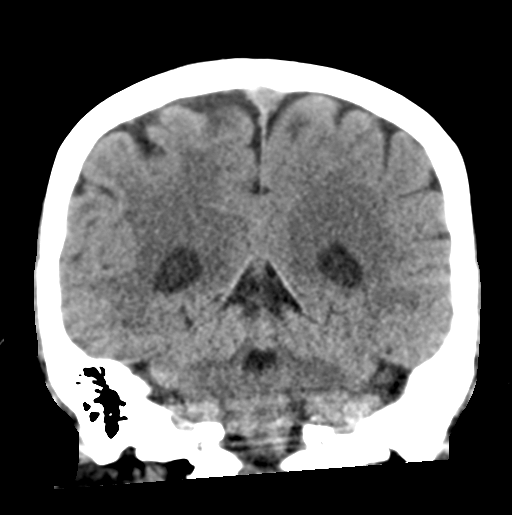
[im 26/58  brain]
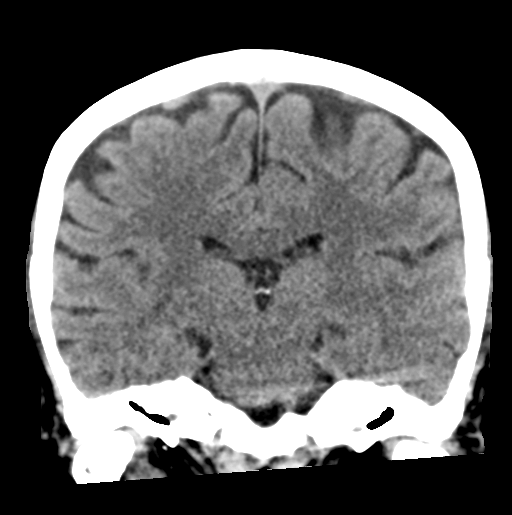
[im 32/58  brain]
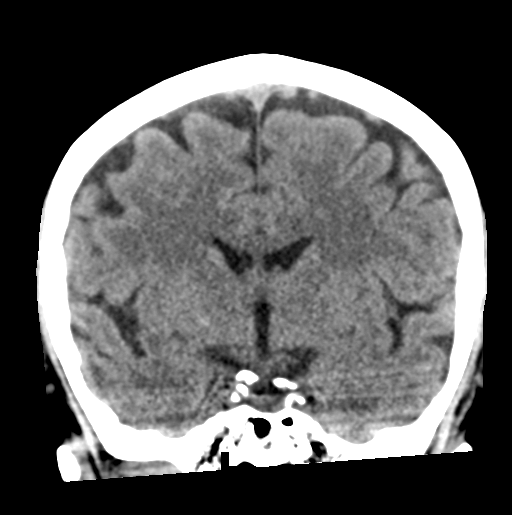

[Series 5: sagittal soft tissue · sagittal · 0.32mm/px · 3 of 52 slices shown]
[im 18/52  brain]
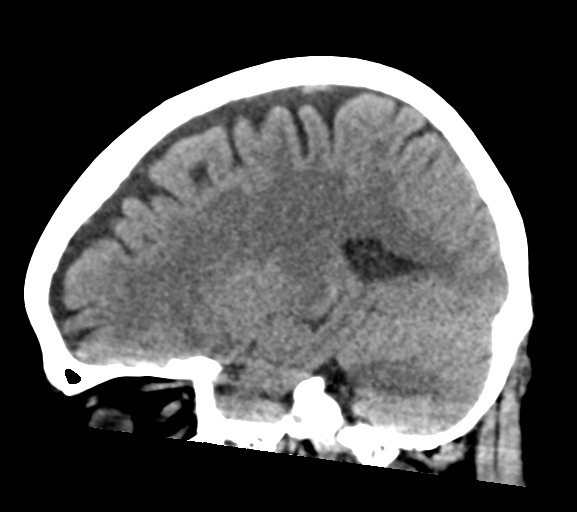
[im 26/52  brain]
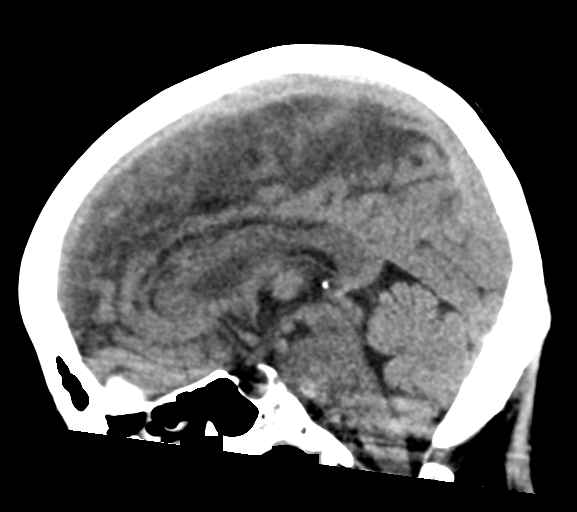
[im 35/52  brain]
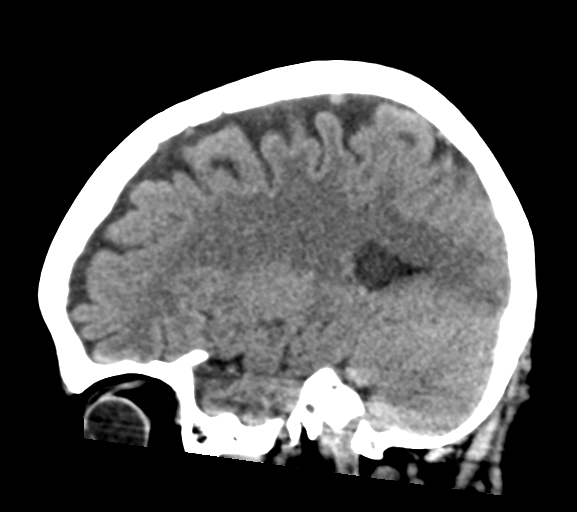

[16 of 46 positions shown; findings below may reference images not displayed]

FINDINGS: Brain: Cerebral volume is within normal limits for age. No midline
shift, ventriculomegaly, mass effect, evidence of mass lesion,
intracranial hemorrhage or evidence of cortically based acute
infarction. Gray-white matter differentiation is within normal
limits throughout the brain. No cortical encephalomalacia.

Vascular: Calcified atherosclerosis at the skull base. No suspicious
intracranial vascular hyperdensity.

Skull: No acute osseous abnormality identified.

Sinuses/Orbits: Chronic appearing left mastoid sclerosis. Visible
paranasal sinuses and right mastoids are well pneumatized.

Other: No acute orbit or scalp soft tissue findings.

ASPECTS (Alberta Stroke Program Early CT Score)

- Ganglionic level infarction (caudate, lentiform nuclei, internal
capsule, insula, M1-M3 cortex): 7

- Supraganglionic infarction (M4-M6 cortex): 3

Total score (0-10 with 10 being normal): 10
IMPRESSION: 1. Normal for age non contrast CT appearance of the brain. ASPECTS
is 10.
2. Study discussed by telephone with Dr. RUDI with Neurology on

## 2019-04-28 IMAGING — US BILATERAL CAROTID DUPLEX ULTRASOUND
1 series · 13 of 24 positions shown · non-contrast
Comparison: None.

CLINICAL DATA: TIA symptoms, hypertension

EXAM:
BILATERAL CAROTID DUPLEX ULTRASOUND
TECHNIQUE: Gray scale imaging, color Doppler and duplex ultrasound were
performed of bilateral carotid and vertebral arteries in the neck.

[Series 1: bilateral carotid duplex ultrasound · 13 of 66 slices shown]
[im 1/66]
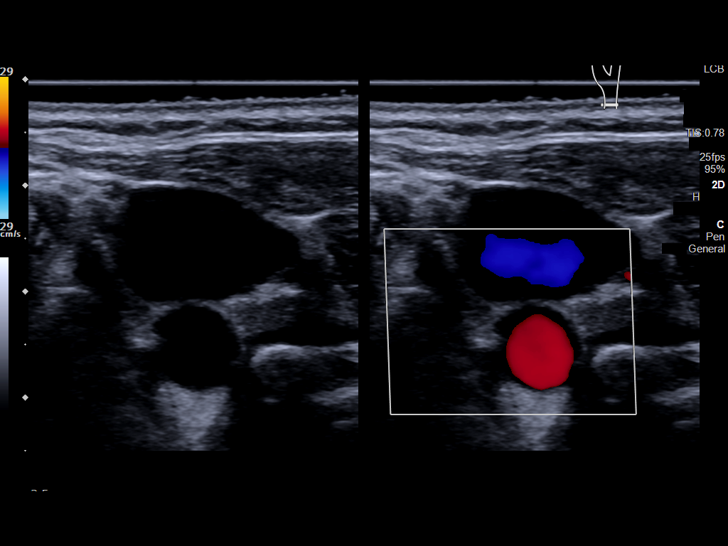
[im 6/66]
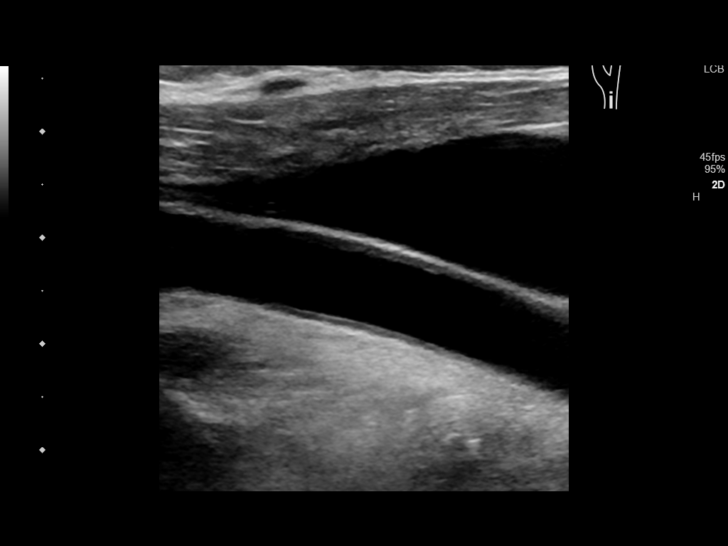
[im 12/66]
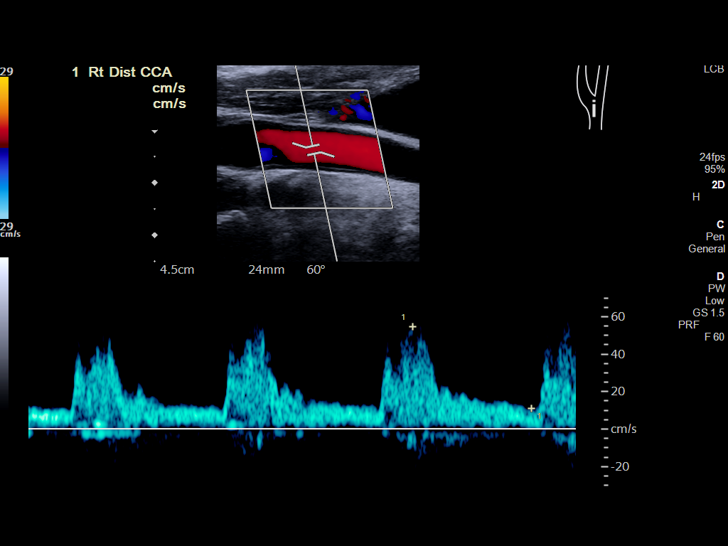
[im 17/66]
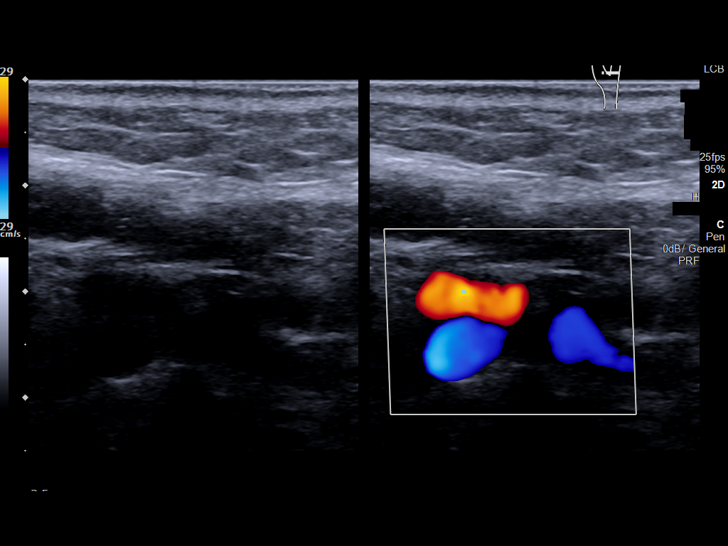
[im 23/66]
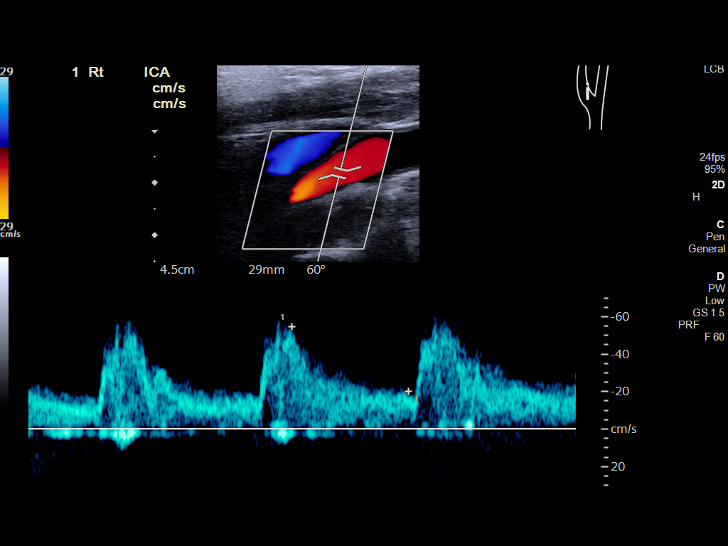
[im 29/66]
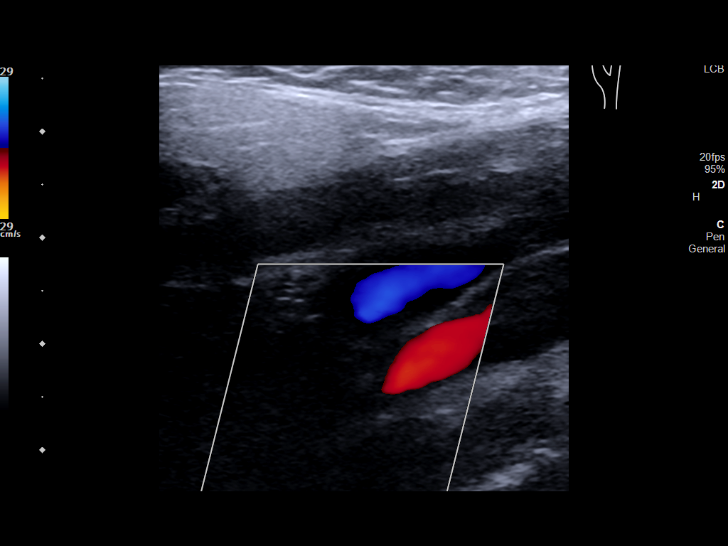
[im 34/66]
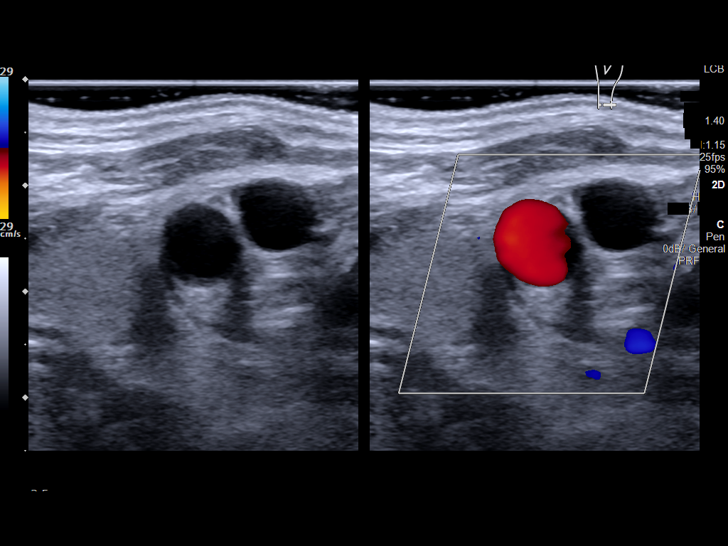
[im 37/66]
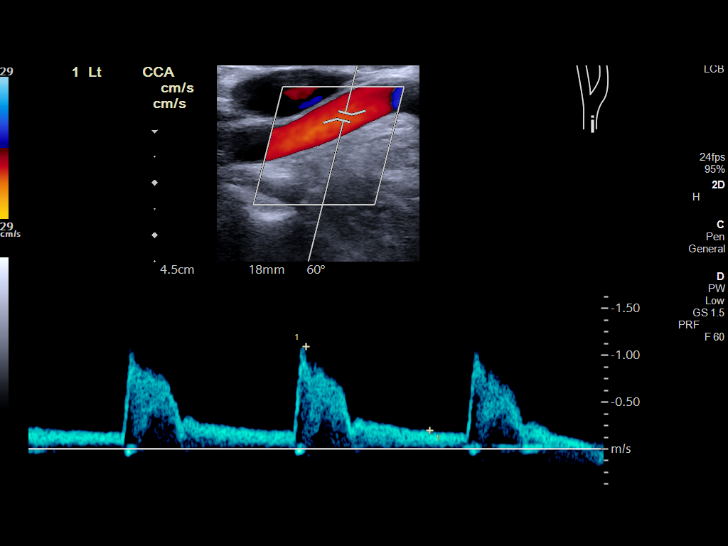
[im 43/66]
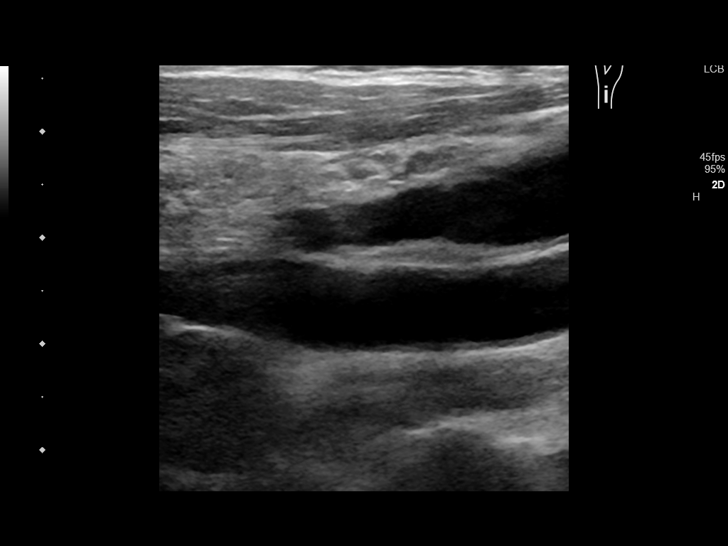
[im 49/66]
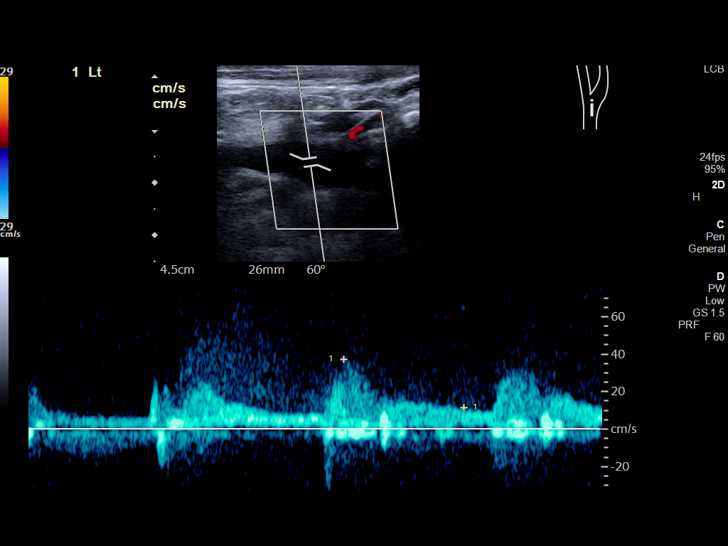
[im 54/66]
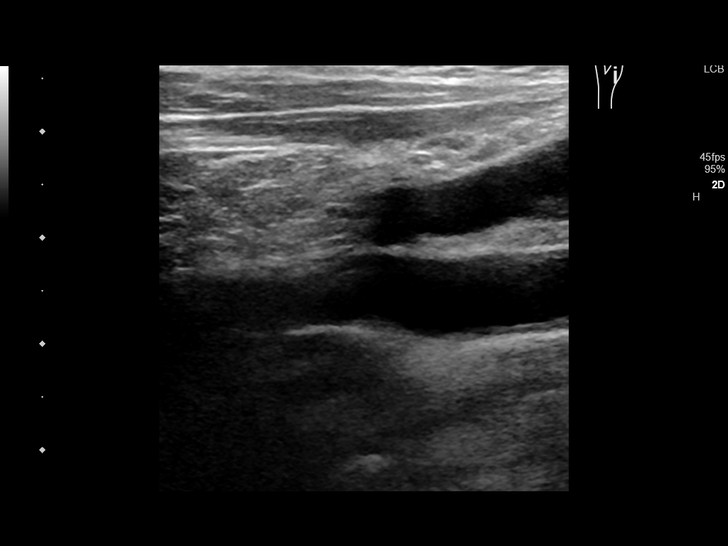
[im 60/66]
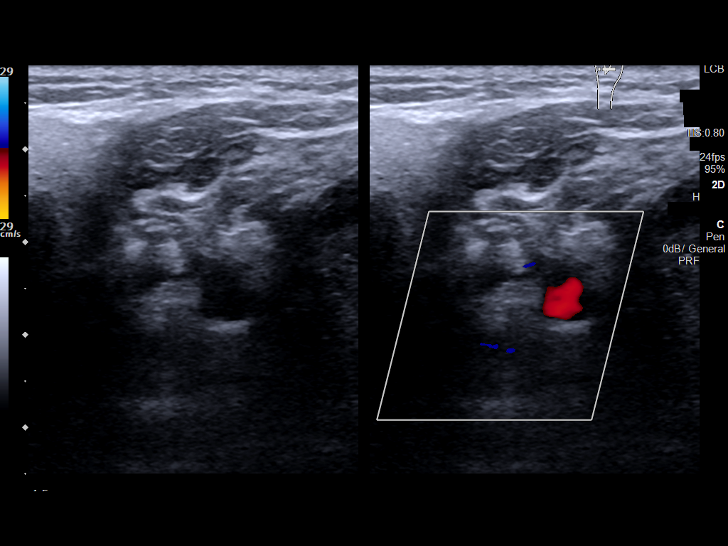
[im 66/66]
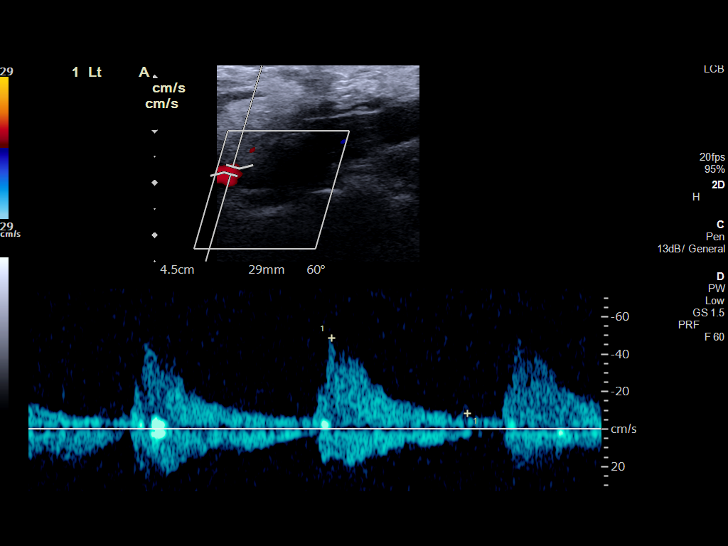

[13 of 24 positions shown; findings below may reference images not displayed]

FINDINGS: Criteria: Quantification of carotid stenosis is based on velocity
parameters that correlate the residual internal carotid diameter
with NASCET-based stenosis levels, using the diameter of the distal
internal carotid lumen as the denominator for stenosis measurement.

The following velocity measurements were obtained:

RIGHT

ICA: 92/30 cm/sec

CCA: 80/17 cm/sec

SYSTOLIC ICA/CCA RATIO:

ECA: 78 cm/sec

LEFT

ICA: 93/24 cm/sec

CCA: 79/17 cm/sec

SYSTOLIC ICA/CCA RATIO:

ECA: 64 cm/sec

RIGHT CAROTID ARTERY: Minor intimal thickening and atherosclerotic
change. No hemodynamically significant right ICA stenosis, velocity
elevation, or turbulent flow. Degree of narrowing less than 50%.

RIGHT VERTEBRAL ARTERY:  Antegrade

LEFT CAROTID ARTERY: Similar scattered minor intimal thickening and
atherosclerotic change. No hemodynamically significant left ICA
stenosis, velocity elevation, or turbulent flow.

LEFT VERTEBRAL ARTERY:  Antegrade
IMPRESSION: Minor carotid atherosclerosis. No hemodynamically significant ICA
stenosis. Degree of narrowing less than 50% bilaterally by
ultrasound criteria.

Patent antegrade vertebral flow bilaterally

## 2019-04-28 MED ORDER — METOPROLOL TARTRATE 5 MG/5ML IV SOLN: 10.0000 mg | Freq: Three times a day (TID) | INTRAVENOUS | Status: DC | PRN

## 2019-04-28 MED ORDER — DILTIAZEM HCL-DEXTROSE 100-5 MG/100ML-% IV SOLN (PREMIX)
5.0000 mg/h | INTRAVENOUS | Status: DC
  Filled 2019-04-28: qty 100

## 2019-04-28 MED ORDER — HYDRALAZINE HCL 50 MG PO TABS
25.0000 mg | ORAL_TABLET | Freq: Three times a day (TID) | ORAL | Status: DC
  Administered 2019-04-28: 10:00:00 25 mg via ORAL
  Filled 2019-04-28: qty 1

## 2019-04-28 MED ORDER — METOPROLOL TARTRATE 5 MG/5ML IV SOLN
5.0000 mg | INTRAVENOUS | Status: DC | PRN
  Administered 2019-04-28: 5 mg via INTRAVENOUS
  Filled 2019-04-28: qty 5

## 2019-04-28 MED ORDER — POTASSIUM CHLORIDE CRYS ER 20 MEQ PO TBCR
20.0000 meq | EXTENDED_RELEASE_TABLET | ORAL | Status: DC
  Filled 2019-04-28: qty 1

## 2019-04-28 MED ORDER — PANTOPRAZOLE SODIUM 40 MG PO TBEC
40.0000 mg | DELAYED_RELEASE_TABLET | Freq: Two times a day (BID) | ORAL | Status: DC
  Administered 2019-04-28 – 2019-04-30 (×4): 40 mg via ORAL
  Filled 2019-04-28 (×4): qty 1

## 2019-04-28 MED ORDER — MAGNESIUM SULFATE 2 GM/50ML IV SOLN
2.0000 g | Freq: Once | INTRAVENOUS | Status: AC
  Administered 2019-04-28: 10:00:00 2 g via INTRAVENOUS
  Filled 2019-04-28: qty 50

## 2019-04-28 MED ORDER — METOPROLOL TARTRATE 25 MG PO TABS
12.5000 mg | ORAL_TABLET | Freq: Two times a day (BID) | ORAL | Status: DC
  Administered 2019-04-28 – 2019-04-30 (×4): 12.5 mg via ORAL
  Filled 2019-04-28 (×5): qty 1

## 2019-04-28 MED ORDER — POTASSIUM CHLORIDE CRYS ER 20 MEQ PO TBCR
40.0000 meq | EXTENDED_RELEASE_TABLET | Freq: Once | ORAL | Status: AC
  Administered 2019-04-28: 40 meq via ORAL

## 2019-04-28 MED ORDER — MAGNESIUM SULFATE 2 GM/50ML IV SOLN
2.0000 g | Freq: Once | INTRAVENOUS | Status: AC
  Administered 2019-04-28: 2 g via INTRAVENOUS
  Filled 2019-04-28: qty 50

## 2019-04-28 MED ORDER — DILTIAZEM HCL 100 MG IV SOLR
5.0000 mg/h | INTRAVENOUS | Status: DC
  Filled 2019-04-28: qty 100

## 2019-04-28 NOTE — Evaluation (Signed)
Physical Therapy Re-Evaluation Patient Details Name: Sarah Sullivan MRN: [id number] DOB: [birth year] Today's Date: 04/28/2019   History of Present Illness  73 y.o. female admitted with nausea,vomiting and diarrrhea leading to AKI and presumed shock liver. Rt sided colitis seen on imaging . Also found to have thrombocytopenia. Renal failure worsening. GI PCR is positive for enteropathogenic E coli   Clinical Impression  Pt in bed eager to get up and show that she can go home today.  No distress on arrival and quick to get to EOB with cuing about walking.  She showed good effort and despite elevated HR to the 150s during activity she did not feel overly fatigued or c/o any pain, etc.  She had some mild unsteadiness and needed single hand held assist t/o ambulation, PT recommended using AD at least initially on return to home.    Follow Up Recommendations Home health PT    Equipment Recommendations  Rolling walker with 5" wheels    Recommendations for Other Services       Precautions / Restrictions Precautions Precautions: Fall Precaution Comments: Pt cleared for PT mobility by MD, pt had been getting up to bathroom despite temp fem cath Restrictions Weight Bearing Restrictions: No      Mobility  Bed Mobility Overal bed mobility: Modified Independent Bed Mobility: Supine to Sit;Sit to Supine     Supine to sit: Min guard Sit to supine: Min guard   General bed mobility comments: did not need phyiscal assist to get to EOB, good confidence and safety  Transfers Overall transfer level: Modified independent Equipment used: Rolling walker (2 wheeled) Transfers: Sit to/from Stand Sit to Stand: Min assist         General transfer comment: Pt able to rise to standing w/o assist, good confidence with minimal RW use  Ambulation/Gait Ambulation/Gait assistance: Min assist Gait Distance (Feet): 35 Feet Assistive device: 1 person hand held assist;IV Pole       General Gait Details:  Pt was able to take a few steps w/o AD/UEs but clearly was much safer and more confident with some UE use on furniture, etc and pt gave her single HHA for majority of the modest bout of in-room ambulation  Stairs            Wheelchair Mobility    Modified Rankin (Stroke Patients Only)       Balance Overall balance assessment: Needs assistance Sitting-balance support: No upper extremity supported Sitting balance-Leahy Scale: Good     Standing balance support: Single extremity supported Standing balance-Leahy Scale: Fair Standing balance comment: Pt able to maintain static standing w/o UEs, but with any dynamic activity was essentially reliant on some UE support                             Pertinent Vitals/Pain Pain Assessment: No/denies pain    Home Living Family/patient expects to be discharged to:: Private residence Living Arrangements: Spouse/significant other Available Help at Discharge: Family;Available 24 hours/day Type of Home: House Home Access: Level entry     Home Layout: One level Home Equipment: (husband has [facility])      Prior Function Level of Independence: Independent         Comments: Indep with ADLs, household and community mobilization without assist device     Hand Dominance        Extremity/Trunk Assessment   Upper Extremity Assessment Upper Extremity Assessment: Generalized weakness;Overall [facility] for tasks assessed  Lower Extremity Assessment Lower Extremity Assessment: Generalized weakness(deferred L hip testing, chronic R ankle PF weakness/ROM )       Communication      Cognition Arousal/Alertness: Awake/alert Behavior During Therapy: WFL for tasks assessed/performed Overall Cognitive Status: Within Functional Limits for tasks assessed                                        General Comments General comments (skin integrity, edema, etc.): Pt's HR increased and sustained 150s during ambulation     Exercises     Assessment/Plan    PT Assessment Patient needs continued PT services  PT Problem List Decreased strength;Decreased activity tolerance;Decreased balance;Decreased mobility;Decreased safety awareness;Decreased knowledge of precautions;Cardiopulmonary status limiting activity       PT Treatment Interventions DME instruction;Gait training;Functional mobility training;Therapeutic activities;Therapeutic exercise;Balance training;Patient/family education    PT Goals (Current goals can be found in the Care Plan section)  Acute Rehab PT Goals Patient Stated Goal: to return home PT Goal Formulation: With patient Time For Goal Achievement: 05/12/19 Potential to Achieve Goals: Good    Frequency Min 2X/week   Barriers to discharge        Co-evaluation               AM-PAC PT "6 Clicks" Mobility  Outcome Measure Help needed turning from your back to your side while in a flat bed without using bedrails?: None Help needed moving from lying on your back to sitting on the side of a flat bed without using bedrails?: None Help needed moving to and from a bed to a chair (including a wheelchair)?: A Little Help needed standing up from a chair using your arms (e.g., wheelchair or bedside chair)?: A Little Help needed to walk in hospital room?: A Little Help needed climbing 3-5 steps with a railing? : A Little 6 Click Score: 20    End of Session Equipment Utilized During Treatment: Gait belt Activity Tolerance: Patient tolerated treatment well Patient left: with bed alarm set;with call bell/phone within reach Nurse Communication: Mobility status(sustained tachy even post activity) PT Visit Diagnosis: Muscle weakness (generalized) (M62.81);Difficulty in walking, not elsewhere classified (R26.2)    Time: [id number] PT Time Calculation (min) (ACUTE ONLY): 17 min   Charges:   PT Evaluation $PT Re-evaluation: 1 Re-eval          Malachi ProGalen R Janeene Sand, DPT 04/28/2019, 2:08  PM

## 2019-04-28 NOTE — Anesthesia Postprocedure Evaluation (Signed)
Anesthesia Post Note  Patient: Sarah Sullivan  Procedure(s) Performed: COLONOSCOPY (N/A )  Patient location during evaluation: PACU Anesthesia Type: General Level of consciousness: awake and alert Pain management: pain level controlled Vital Signs Assessment: post-procedure vital signs reviewed and stable Respiratory status: spontaneous breathing, nonlabored ventilation and respiratory function stable Cardiovascular status: blood pressure returned to baseline and stable Postop Assessment: no apparent nausea or vomiting Anesthetic complications: no     Last Vitals:  Vitals:   04/28/19 0625 04/28/19 0918  BP: (!) 182/73 (!) 174/77  Pulse: 76 71  Resp: 14   Temp: 36.9 C   SpO2: 94%     Last Pain:  Vitals:   04/28/19 0625  TempSrc: Oral  PainSc:                  Jovita Gamma

## 2019-04-28 NOTE — Progress Notes (Signed)
[facility]  ROUNDING NOTE   Subjective:   Denies acute c/o  Reports b/l LE edema has improved significantly some SOB, Tachycardic- A fib with RVR this morning -colonoscopy unremarkable Serum creatinine improving without dialysis  Objective:  Vital signs in last 24 hours:  Temp:  [97 F (36.1 C)-98.5 F (36.9 C)] 98.5 F (36.9 C) (06/05 0625) Pulse Rate:  [62-76] 71 (06/05 0918) Resp:  [14-16] 14 (06/05 0625) BP: (167-182)/(64-77) 174/77 (06/05 0918) SpO2:  [94 %-98 %] 94 % (06/05 0625)  Weight change:  Filed Weights   04/21/19 1532 04/26/19 1234  Weight: 57.4 kg 57.4 kg    Intake/Output: I/O last 3 completed shifts: In: 770 [P.O.:240; I.V.:110; Blood:420] Out: -    Intake/Output this shift:  Total I/O In: 240 [P.O.:240] Out: -   Physical Exam: General: NAD,   Head: Normocephalic, atraumatic. Moist oral mucosal membranes  Eyes: Anicteric,    Neck: Supple, trachea midline  Lungs:  Decreased sounds at bases  Heart: Irregular, tachycardic  Abdomen:  Mild distension, nontender  Extremities: + peripheral edema.  Neurologic: Nonfocal, moving all four extremities  Skin: No lesions   Access: Left femoral temp HD catheter 5/29 Dr. Wyn Quaker    Basic Metabolic Panel: Recent Labs  Lab 04/23/19 0414  04/24/19 0453 04/25/19 0359 04/26/19 0543 04/27/19 0414 04/28/19 0405  NA 143  --  144 144 145 146* 146*  K 2.8*   < > 3.3* 3.8 3.3* 3.5 3.0*  CL 102  --  105 107 108 110 109  CO2 28  --  29 27 26 25 26   GLUCOSE 109*  --  88 143* 130* 123* 100*  BUN 53*  --  52* 58* 55* 51* 50*  CREATININE 4.10*  --  4.49* 4.25* 3.80* 3.22* 2.97*  CALCIUM 7.8*  --  8.0* 8.1* 7.9* 8.2* 8.2*  MG 1.8  --  1.8 1.8  --   --  1.6*   < > = values in this interval not displayed.    Liver Function Tests: Recent Labs  Lab 04/22/19 0245 04/28/19 0405  AST 146* 40  ALT 108* 58*  ALKPHOS 672* 307*  BILITOT 0.9 0.8  PROT 5.5* 6.0*  ALBUMIN 3.1* 2.9*   No results for  input(s): LIPASE, AMYLASE in the last 168 hours. No results for input(s): AMMONIA in the last 168 hours.  CBC: Recent Labs  Lab 04/24/19 0453 04/24/19 1913 04/25/19 0359 04/25/19 1755 04/25/19 2254 04/26/19 0543 04/27/19 0411 04/28/19 0405  WBC 10.8* 7.4 7.6  --   --  8.5  --  8.6  HGB 7.4* 8.2* 7.3* 7.6* 7.4* 6.9* 8.0* 8.4*  HCT 22.6* 24.7* 22.3* 23.0* 22.5* 21.6* 24.4* 25.8*  MCV 90.4 92.5 93.3  --   --  95.2  --  94.2  PLT 132* 126* 119*  --   --  124*  --  120*    Cardiac Enzymes: No results for input(s): CKTOTAL, CKMB, CKMBINDEX, TROPONINI in the last 168 hours.  BNP: Invalid input(s): POCBNP  CBG: No results for input(s): GLUCAP in the last 168 hours.  Microbiology: Results for orders placed or performed during the hospital encounter of 04/14/19  SARS Coronavirus 2 (CEPHEID - Performed in [facility] hospital lab), Hosp Order     Status: None   Collection Time: 04/14/19 10:32 AM  Result Value Ref Range Status   SARS Coronavirus 2 NEGATIVE NEGATIVE Final    Comment: (NOTE) If result is NEGATIVE SARS-CoV-2 target nucleic acids  are NOT DETECTED. The SARS-CoV-2 RNA is generally detectable in upper and lower  respiratory specimens during the acute phase of infection. The lowest  concentration of SARS-CoV-2 viral copies this assay can detect is 250  copies / mL. A negative result does not preclude SARS-CoV-2 infection  and should not be used as the sole basis for treatment or other  patient management decisions.  A negative result may occur with  improper specimen collection / handling, submission of specimen other  than nasopharyngeal swab, presence of viral mutation(s) within the  areas targeted by this assay, and inadequate number of viral copies  (<250 copies / mL). A negative result must be combined with clinical  observations, patient history, and epidemiological information. If result is POSITIVE SARS-CoV-2 target nucleic acids are DETECTED. The SARS-CoV-2  RNA is generally detectable in upper and lower  respiratory specimens dur ing the acute phase of infection.  Positive  results are indicative of active infection with SARS-CoV-2.  Clinical  correlation with patient history and other diagnostic information is  necessary to determine patient infection status.  Positive results do  not rule out bacterial infection or co-infection with other viruses. If result is PRESUMPTIVE POSTIVE SARS-CoV-2 nucleic acids MAY BE PRESENT.   A presumptive positive result was obtained on the submitted specimen  and confirmed on repeat testing.  While 2019 novel coronavirus  (SARS-CoV-2) nucleic acids may be present in the submitted sample  additional confirmatory testing may be necessary for epidemiological  and / or clinical management purposes  to differentiate between  SARS-CoV-2 and other Sarbecovirus currently known to infect humans.  If clinically indicated additional testing with an alternate test  methodology [phone]) is advised. The SARS-CoV-2 RNA is generally  detectable in upper and lower respiratory sp ecimens during the acute  phase of infection. The expected result is Negative. Fact Sheet for Patients:  BoilerBrush.com.cy Fact Sheet for Healthcare Providers: https://pope.com/ This test is not yet approved or cleared by the [place] FDA and has been authorized for detection and/or diagnosis of SARS-CoV-2 by FDA under an Emergency Use Authorization (EUA).  This EUA will remain in effect (meaning this test can be used) for the duration of the COVID-19 declaration under Section 564(b)(1) of the Act, 21 U.S.C. section 360bbb-3(b)(1), unless the authorization is terminated or revoked sooner. Performed at [facility], [address] [place], [place] [address]   MRSA PCR Screening     Status: None   Collection Time: 04/14/19  7:22 PM  Result Value Ref Range Status   MRSA by PCR  NEGATIVE NEGATIVE Final    Comment:        The GeneXpert MRSA Assay (FDA approved for NASAL specimens only), is one component of a comprehensive MRSA colonization surveillance program. It is not intended to diagnose MRSA infection nor to guide or monitor treatment for MRSA infections. Performed at [facility], [address] [place], [place] [address]   CULTURE, BLOOD (ROUTINE X 2) w Reflex to ID Panel     Status: None   Collection Time: 04/16/19  7:47 AM  Result Value Ref Range Status   Specimen Description BLOOD BLOOD RIGHT HAND  Final   Special Requests   Final    BOTTLES DRAWN AEROBIC AND ANAEROBIC Blood Culture adequate volume   Culture   Final    NO GROWTH 5 DAYS Performed at [facility], [address], [place], [place] [address]    Report Status 04/21/2019 FINAL  Final  CULTURE, BLOOD (ROUTINE X 2) w Reflex to ID Panel     Status: None   Collection Time: 04/16/19  9:27 AM  Result Value Ref Range Status   Specimen Description BLOOD BLOOD RIGHT HAND  Final   Special Requests   Final    BOTTLES DRAWN AEROBIC ONLY Blood Culture adequate volume   Culture   Final    NO GROWTH 5 DAYS Performed at [facility], [address] [place], [place] [address]    Report Status 04/21/2019 FINAL  Final  MRSA PCR Screening     Status: None   Collection Time: 04/17/19  1:47 PM  Result Value Ref Range Status   MRSA by PCR NEGATIVE NEGATIVE Final    Comment:        The GeneXpert MRSA Assay (FDA approved for NASAL specimens only), is one component of a comprehensive MRSA colonization surveillance program. It is not intended to diagnose MRSA infection nor to guide or monitor treatment for MRSA infections. Performed at [facility], [address] [place], [place] [address]   Gastrointestinal Panel by PCR , Stool     Status: Abnormal   Collection Time: 04/17/19 10:42 PM  Result Value Ref Range Status   Campylobacter species NOT  DETECTED NOT DETECTED Final   Plesimonas shigelloides NOT DETECTED NOT DETECTED Final   Salmonella species NOT DETECTED NOT DETECTED Final   Yersinia enterocolitica NOT DETECTED NOT DETECTED Final   Vibrio species NOT DETECTED NOT DETECTED Final   Vibrio cholerae NOT DETECTED NOT DETECTED Final   Enteroaggregative E coli (EAEC) NOT DETECTED NOT DETECTED Final   Enteropathogenic E coli (EPEC) DETECTED (A) NOT DETECTED Final    Comment: RESULT CALLED TO, READ BACK BY AND VERIFIED WITH: BETH BUONO AT 0211 04/19/2019 SDR    Enterotoxigenic E coli (ETEC) NOT DETECTED NOT DETECTED Final   Shiga like toxin producing E coli (STEC) NOT DETECTED NOT DETECTED Final   Shigella/Enteroinvasive E coli (EIEC) NOT DETECTED NOT DETECTED Final   Cryptosporidium NOT DETECTED NOT DETECTED Final   Cyclospora cayetanensis NOT DETECTED NOT DETECTED Final   Entamoeba histolytica NOT DETECTED NOT DETECTED Final   Giardia lamblia NOT DETECTED NOT DETECTED Final   Adenovirus F40/41 NOT DETECTED NOT DETECTED Final   Astrovirus NOT DETECTED NOT DETECTED Final   Norovirus GI/GII NOT DETECTED NOT DETECTED Final   Rotavirus A NOT DETECTED NOT DETECTED Final   Sapovirus (I, II, IV, and V) NOT DETECTED NOT DETECTED Final    Comment: Performed at [facility], [address] [place], [place] [address]  C difficile quick scan w PCR reflex     Status: None   Collection Time: 04/18/19 10:42 PM  Result Value Ref Range Status   C Diff antigen NEGATIVE NEGATIVE Final   C Diff toxin NEGATIVE NEGATIVE Final   C Diff interpretation No C. difficile detected.  Final    Comment: Performed at [facility], [address] [place], [place] [address]  Novel Coronavirus, NAA (hospital order; send-out to ref lab)     Status: None   Collection Time: 04/25/19  5:04 PM  Result Value Ref Range Status   SARS-CoV-2, NAA NOT DETECTED NOT DETECTED Final    Comment: (NOTE) This test was developed and its performance  characteristics determined by [facility]. This test has not been FDA cleared or approved. This test has been authorized by FDA under an Emergency Use Authorization (EUA). This test is only authorized for the duration  of time the declaration that circumstances exist justifying the authorization of the emergency use of in vitro diagnostic tests for detection of SARS-CoV-2 virus and/or diagnosis of COVID-19 infection under section 564(b)(1) of the Act, 21 U.S.C. 295AOZ-3(Y)(8), unless the authorization is terminated or revoked sooner. When diagnostic testing is negative, the possibility of a false negative result should be considered in the context of a patient's recent exposures and the presence of clinical signs and symptoms consistent with COVID-19. An individual without symptoms of COVID-19 and who is not shedding SARS-CoV-2 virus would expect to have a negative (not detected) result in this assay. Performed  At: [facility] [address] [place], [place] [address] Jolene Schimke MD XB:[id number]    Coronavirus Source NASOPHARYNGEAL  Final    Comment: Performed at [facility], [address] [place], [place] [address]    Coagulation Studies: No results for input(s): LABPROT, INR in the last 72 hours.  Urinalysis: No results for input(s): COLORURINE, LABSPEC, PHURINE, GLUCOSEU, HGBUR, BILIRUBINUR, KETONESUR, PROTEINUR, UROBILINOGEN, NITRITE, LEUKOCYTESUR in the last 72 hours.  Invalid input(s): APPERANCEUR    Imaging: No results found.   Medications:   . sodium chloride Stopped (04/27/19 1646)  . diltiazem (CARDIZEM) infusion    . sodium chloride     . aspirin  81 mg Oral Daily  . ciprofloxacin  500 mg Oral Daily  . epoetin (EPOGEN/PROCRIT) injection  10,000 Units Subcutaneous Weekly  . feeding supplement  1 Container Oral TID BM  . metoprolol tartrate  12.5 mg Oral BID  . multivitamin  1 tablet Oral QHS  . pantoprazole (PROTONIX) IV   40 mg Intravenous Q12H  . sodium chloride flush  10-40 mL Intracatheter Q12H  . ursodiol  300 mg Oral BID  . venlafaxine XR  37.5 mg Oral Q breakfast   sodium chloride, meclizine, metoprolol tartrate, metoprolol tartrate, ondansetron **OR** ondansetron (ZOFRAN) IV, senna-docusate, sodium chloride flush, sodium chloride flush  Assessment/ Plan:  Ms. Kaci Dillie is a 73 y.o. Asian female with a PMHx of hypertension, degenerative disc disease, lumbar radiculitis, history of depression, osteopenia, who was admitted to [facility] on 04/14/2019 for hypovolemic shock with E. Coli colitis.   1.  Acute renal failure with metabolic acidosis: secondary to prerenal azotemia, GI losses and blood losses. ATN. Nonoliguric urine output. Hematuria and proteinuria on admission.  Creatinine baseline of 1, GFR of 56 on 03/27/19. Consistent with chronic kidney disease stage III. Renal imaging- CT abdomen Abd/pelvis- 5/23 was unremarkable except for colitis -No further need for hemodialysis is anticipated -May remove dialysis catheter when there is no need for central line - bicarb improved. 06/04 0701 - 06/05 0700 In: 350 [P.O.:240; I.V.:110] Out: -    2. LE edema - 3rd spacing and renal failure, significantly improved - monitor UOP   - may hold diuretic in setting of tachycardia and hemodynamic instability  3. Anemia with renal failure: status post 1 unit PRBC 5/26. With thrombocytopenia Appreciate Hematology input - EPO SQ  4. Colitis:Enteropathogenic E coli (EPEC) - ciprofloxacin.  - colonoscopy unremarkable   LOS: 12 Fantasy Donald 6/5/202012:13 PM

## 2019-04-28 NOTE — Progress Notes (Signed)
Patient received to ICU as a Code Stroke. No report received from the floor RN; limited report received from charge RN. Initial physical exam is reassuring. NIH = 0. CTH negative. Patient has orders for [place] of carotid arteries and for MRI brain. VS WNL and stable. Orders for Diltiazem drip not initiated as patient is in SB/SR with rates ~60bpm and blood pressure is WNL. Will CTM.

## 2019-04-28 NOTE — Progress Notes (Signed)
Ch pg for RT for pt that was having a stroke. Ch checked in the with the care team who were contacting a provider to exam the pt. Ch observed that pt was having a hard time responding and facial drooping was noticed. Ch communicated to staff to pg if there would be further needs.    04/28/19 1234  Clinical Encounter Type  Visited With Patient;Health care provider  Visit Type Code  Referral From Nurse  Consult/Referral To Chaplain  Spiritual Encounters  Spiritual Needs Emotional;Grief support  Stress Factors  Patient Stress Factors Loss of control;Loss;Health changes  Family Stress Factors None identified

## 2019-04-28 NOTE — Progress Notes (Signed)
Patient ID: Sarah Sullivan, female   DOB: 12-21-1945, 73 y.o.   MRN: [id number]  [facility] PROGRESS NOTE  Sarah Sullivan HRC:[id number] DOB: [birth year] DOA: 04/14/2019 PCP: Titus Mould, NP  HPI/Subjective:  Patient is tachycardic and dizzy today.  Not feeling good.  Subsequently facial droop noticed and code stroke called.  Patient moved to intensive care unit for code stroke Patient had a colonoscopy on 04/27/2019 which was normal patient had temporary hemodialysis catheter placement 04/21/2019 nausea but no vomiting.  Abdominal pain is less.    Objective: Vitals:   04/28/19 0625 04/28/19 0918  BP: (!) 182/73 (!) 174/77  Pulse: 76 71  Resp: 14   Temp: 98.5 F (36.9 C)   SpO2: 94%     Filed Weights   04/21/19 1532 04/26/19 1234  Weight: 57.4 kg 57.4 kg    ROS: Review of Systems  Constitutional: Negative for chills and fever.  Eyes: Negative for blurred vision.  Respiratory: Negative for cough and shortness of breath.   Cardiovascular: Negative for chest pain.  Gastrointestinal: Negative for abdominal pain, constipation, diarrhea, nausea and vomiting.  Genitourinary: Negative for dysuria.  Musculoskeletal: Negative for joint pain.  Neurological: Negative for dizziness and headaches.   Exam: Physical Exam  Constitutional: She is oriented to person, place, and time.  HENT:  Nose: No mucosal edema.  Mouth/Throat: No oropharyngeal exudate or posterior oropharyngeal edema.  Eyes: Pupils are equal, round, and reactive to light. Conjunctivae, EOM and lids are normal.  Neck: No JVD present. Carotid bruit is not present. No edema present. No thyroid mass and no thyromegaly present.  Cardiovascular: S1 normal and S2 normal. Exam reveals no gallop.  No murmur heard. Pulses:      Dorsalis pedis pulses are 2+ on the right side and 2+ on the left side.  Irregularly irregular  Respiratory: No respiratory distress. She has decreased breath sounds in the right lower field  and the left lower field. She has no wheezes. She has no rhonchi. She has no rales.  GI: Soft. Bowel sounds are normal. There is no abdominal tenderness.  Left groin with temporary TLC catheter, no active bleeding  Musculoskeletal:     Right ankle: She exhibits no swelling.     Left ankle: She exhibits no swelling.  Lymphadenopathy:    She has no cervical adenopathy.  Neurological: She is alert and oriented to person, place, and time. No cranial nerve deficit.  Positive facial droop  Skin: Skin is warm. No rash noted. Nails show no clubbing.  Psychiatric: She has a normal mood and affect.      Data Reviewed: Basic Metabolic Panel: Recent Labs  Lab 04/23/19 0414  04/24/19 0453 04/25/19 0359 04/26/19 0543 04/27/19 0414 04/28/19 0405  NA 143  --  144 144 145 146* 146*  K 2.8*   < > 3.3* 3.8 3.3* 3.5 3.0*  CL 102  --  105 107 108 110 109  CO2 28  --  29 27 26 25 26   GLUCOSE 109*  --  88 143* 130* 123* 100*  BUN 53*  --  52* 58* 55* 51* 50*  CREATININE 4.10*  --  4.49* 4.25* 3.80* 3.22* 2.97*  CALCIUM 7.8*  --  8.0* 8.1* 7.9* 8.2* 8.2*  MG 1.8  --  1.8 1.8  --   --  1.6*   < > = values in this interval not displayed.   Liver Function Tests: Recent Labs  Lab 04/22/19 0245 04/28/19 0405  AST 146*  40  ALT 108* 58*  ALKPHOS 672* 307*  BILITOT 0.9 0.8  PROT 5.5* 6.0*  ALBUMIN 3.1* 2.9*   No results for input(s): LIPASE, AMYLASE in the last 168 hours. CBC: Recent Labs  Lab 04/24/19 0453 04/24/19 1913 04/25/19 0359 04/25/19 1755 04/25/19 2254 04/26/19 0543 04/27/19 0411 04/28/19 0405  WBC 10.8* 7.4 7.6  --   --  8.5  --  8.6  HGB 7.4* 8.2* 7.3* 7.6* 7.4* 6.9* 8.0* 8.4*  HCT 22.6* 24.7* 22.3* 23.0* 22.5* 21.6* 24.4* 25.8*  MCV 90.4 92.5 93.3  --   --  95.2  --  94.2  PLT 132* 126* 119*  --   --  124*  --  120*   Cardiac Enzymes: No results for input(s): CKTOTAL, CKMB, CKMBINDEX, TROPONINI in the last 168 hours.   Recent Results (from the past 240 hour(s))  C  difficile quick scan w PCR reflex     Status: None   Collection Time: 04/18/19 10:42 PM  Result Value Ref Range Status   C Diff antigen NEGATIVE NEGATIVE Final   C Diff toxin NEGATIVE NEGATIVE Final   C Diff interpretation No C. difficile detected.  Final    Comment: Performed at [facility], [address] [place], [place] [address]  Novel Coronavirus, NAA (hospital order; send-out to ref lab)     Status: None   Collection Time: 04/25/19  5:04 PM  Result Value Ref Range Status   SARS-CoV-2, NAA NOT DETECTED NOT DETECTED Final    Comment: (NOTE) This test was developed and its performance characteristics determined by [employer]. This test has not been FDA cleared or approved. This test has been authorized by FDA under an Emergency Use Authorization (EUA). This test is only authorized for the duration of time the declaration that circumstances exist justifying the authorization of the emergency use of in vitro diagnostic tests for detection of SARS-CoV-2 virus and/or diagnosis of COVID-19 infection under section 564(b)(1) of the Act, 21 U.S.C. 213YQM-5(H)(8), unless the authorization is terminated or revoked sooner. When diagnostic testing is negative, the possibility of a false negative result should be considered in the context of a patient's recent exposures and the presence of clinical signs and symptoms consistent with COVID-19. An individual without symptoms of COVID-19 and who is not shedding SARS-CoV-2 virus would expect to have a negative (not detected) result in this assay. Performed  At: [facility] [address] [place], [place] [id number] Jolene Schimke MD UX:[id number]    Coronavirus Source NASOPHARYNGEAL  Final    Comment: Performed at [facility], [address] [place], [place] [address]      Scheduled Meds: . aspirin  81 mg Oral Daily  . epoetin (EPOGEN/PROCRIT) injection  10,000 Units Subcutaneous Weekly  . feeding  supplement  1 Container Oral TID BM  . metoprolol tartrate  12.5 mg Oral BID  . multivitamin  1 tablet Oral QHS  . pantoprazole  40 mg Oral BID AC  . sodium chloride flush  10-40 mL Intracatheter Q12H  . ursodiol  300 mg Oral BID  . venlafaxine XR  37.5 mg Oral Q breakfast   Continuous Infusions: . sodium chloride Stopped (04/27/19 1646)  . diltiazem (CARDIZEM) infusion Stopped (04/28/19 1322)  . magnesium sulfate bolus IVPB    . sodium chloride      Assessment/Plan:  1. Atrial fibrillation with rapid ventricular response.  Patient is started on Cardizem drip, notified [facility] cardiology Dr. Gwen Pounds, [facility] cardiology has been  following this patient regarding the same during this admission.  Deferring anticoagulation to cardiology 2. Acute right facial droop rule out stroke -code stroke called CT head is negative.  CT angiogram not done in view of renal insufficiency and low NIHSS score.  Echocardiogram from 524 has revealed 60 to 65% of ejection fraction and no cardiac source of emboli noticed.  Due to GI bleed not on anticoagulation or antiplatelet candidate.  Once patient is stable neurology is recommending to consider low-dose NOAC  Follow-up with Dr. Thad Rangereynolds.  N.p.o. until RN gets bedside swallow evaluation.  MRI MRI of the brain, PT OT speech therapy consult 3. bloody diarrhea -EGD on 04/26/2019 has revealed multiple nonbleeding gastric and duodenal ulcers with no perforation .recommending PPI 40 mg twice daily for 3 months and follow-up for repeat EGD at GI office.  Strictly no NSAIDs . Scheduled for colonoscopy todayby Dr. Allegra LaiVanga I appreciate her recommendations monitor CBC  4. acute kidney injury.  Likely secondary to hypovolemic shock and GI losses.  Case discussed with nephrology and temporary dialysis catheter placed on 04/21/2019 groin.  Patient had hemodialysis on 04/22/2019.  Clinically improving nephrology is not considering any more dialysis.  Discontinued diuretics at this point of  time 5. Symptomatic anemia- 1 u prbc given during the hospital course hemoglobin is stable, monitor closely and transfuse as needed 6. hypovolemic shock.  Resolved holding antihypertensive medications.  Solu-Cortef to 25 mg twice a day. changed to p.o. prednisone and tapered off which might improve her nausea and peptic ulcer disease 7. 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  Continue Cipro.  GI is following no new recommendations at this time 8. Thrombocytopenia continue to monitor blood counts.-Secondary to severe sepsis resolved smear does show some red blood cell fragments.  Platelet count back to normal  9. Elevated liver function test.  Likely with hypotension.  Continue to monitor.  Hepatitis panel negative.  Ultrasound concerning for early cirrhosis even though CAT scan did not comment on this.  GI is following.  Repeat a.m. labs 10. LE edema- third spacing, albumin 3.1, will supplement with nepro , dietary seeing pt , supplements  11. DVT prophylaxis with Lovenox held in view of symptomatic anemia and GI bleed.  SCDs 12. Generalized weakness PT assessment  Code Status:     Code Status Orders  (From admission, onward)         Start     Ordered   04/14/19 1533  Full code  Continuous     04/14/19 1532        Code Status History    This patient has a current code status but no historical code status.     Family Communication: Called and updated patient's husband he is aware of the patient's critical situation disposition Plan: To be determined  Consultants:  Critical care specialist  Gastroenterology  Nephrology  Oncology Neurology Time critical care time spent: 39 minutes.  Spoke with nephrology  Sarah Sullivan  [employer]

## 2019-04-28 NOTE — Progress Notes (Signed)
CHIEF COMPLAINT:   Chief Complaint  Patient presents with  . Nausea  . Emesis    Subjective  Admitted for CODE STROKE Was initially admitted for renal failure and GI symptoms notged to have some lethargy and increased HR on the gne med floor Upon arrival to ICU patient alert and awake, NAD No pain, no SOB BP (!) 174/77   Pulse 71   Temp 98.5 F (36.9 C) (Oral)   Resp 14   Ht 4\' 11"  (1.499 m)   Wt 57.4 kg   SpO2 94%   BMI 25.56 kg/m   She is alert and awake, NAD   Review of Systems:  Gen:  Denies  fever, sweats, chills weigh loss  HEENT: Denies blurred vision, double vision, ear pain, eye pain, hearing loss, nose bleeds, sore throat Cardiac:  No dizziness, chest pain or heaviness, chest tightness,edema, No JVD Resp:   No cough, -sputum production, -shortness of breath,-wheezing, -hemoptysis,  Gi: Denies swallowing difficulty, stomach pain, nausea or vomiting, diarrhea, constipation, bowel incontinence Gu:  Denies bladder incontinence, burning urine Ext:   Denies Joint pain, stiffness or swelling Skin: Denies  skin rash, easy bruising or bleeding or hives Endoc:  Denies polyuria, polydipsia , polyphagia or weight change Psych:   Denies depression, insomnia or hallucinations  Other:  All other systems negative      Objective   Examination:  General exam: Appears calm and comfortable  Respiratory system: Clear to auscultation. Respiratory effort normal. HEENT: [place]/AT, PERRLA, no thrush, no stridor. Cardiovascular system: S1 & S2 heard, RRR. No JVD, murmurs, rubs, gallops or clicks. No pedal edema. Gastrointestinal system: Abdomen is nondistended, soft and nontender. No organomegaly or masses felt. Normal bowel sounds heard. Central nervous system: Alert and oriented. No focal neurological deficits. Extremities: Symmetric 5 x 5 power. Skin: No rashes, lesions or ulcers Psychiatry: Judgement and insight appear normal. Mood & affect appropriate.   VITALS:  height  is 4\' 11"  (1.499 m) and weight is 57.4 kg. Her oral temperature is 98.5 F (36.9 C). Her blood pressure is 174/77 (abnormal) and her pulse is 71. Her respiration is 14 and oxygen saturation is 94%.   I personally reviewed Labs under Results section.  Radiology Reports Ct Head Code Stroke Wo Contrast`  Result Date: 04/28/2019 CLINICAL DATA:  Code stroke. 73 year old female with right facial droop and altered mental status. EXAM: CT HEAD WITHOUT CONTRAST TECHNIQUE: Contiguous axial images were obtained from the base of the skull through the vertex without intravenous contrast. COMPARISON:  None. FINDINGS: Brain: Cerebral volume is within normal limits for age. No midline shift, ventriculomegaly, mass effect, evidence of mass lesion, intracranial hemorrhage or evidence of cortically based acute infarction. Gray-white matter differentiation is within normal limits throughout the brain. No cortical encephalomalacia. Vascular: Calcified atherosclerosis at the skull base. No suspicious intracranial vascular hyperdensity. Skull: No acute osseous abnormality identified. Sinuses/Orbits: Chronic appearing left mastoid sclerosis. Visible paranasal sinuses and right mastoids are well pneumatized. Other: No acute orbit or scalp soft tissue findings. ASPECTS [facility]([place] Stroke Program Early CT Score) - Ganglionic level infarction (caudate, lentiform nuclei, internal capsule, insula, M1-M3 cortex): 7 - Supraganglionic infarction (M4-M6 cortex): 3 Total score (0-10 with 10 being normal): 10 IMPRESSION: 1. Normal for age non contrast CT appearance of the brain. ASPECTS is 10. 2. Study discussed by telephone with Dr. Thad Rangereynolds with Neurology on 04/28/2019 at 13:20 . Electronically Signed   By: Odessa FlemingH  Hall M.D.   On: 04/28/2019 13:20  Assessment/Plan:  CODE STROKE-no acute issues No acute findings at this time CT head neg Check ECHO Check Lytes Check CE Replace MG    ELECTROLYTES -follow labs as needed -replace as  needed -pharmacy consultation and following    DVT/GI PRX ordered TRANSFUSIONS AS NEEDED MONITOR FSBS ASSESS the need for LABS as needed  Follow up Neuro Recs  Lucie Leather, M.D.  [facility]  Medical Director [facility] [address] [facility] Medical Director [facility] Cardio-Pulmonary Department

## 2019-04-28 NOTE — Progress Notes (Signed)
Pharmacy Electrolyte Monitoring Consult:  Pharmacy consulted to assist in monitoring and replacing electrolytes in this 73 y.o. female admitted on 04/14/2019 with Nausea and Emesis   Labs:  Sodium (mmol/L)  Date Value  04/28/2019 146 (H)  12/25/2014 141   Potassium (mmol/L)  Date Value  04/28/2019 3.0 (L)  12/25/2014 3.5   Magnesium (mg/dL)  Date Value  67/34/1937 1.8   Phosphorus (mg/dL)  Date Value  90/24/0973 3.0   Calcium (mg/dL)  Date Value  53/29/9242 8.2 (L)   Calcium, Total (mg/dL)  Date Value  68/34/1962 9.2   Albumin (g/dL)  Date Value  22/97/9892 2.9 (L)    Assessment/Plan: Patient in acute renal failure, started on HD on 5/30  5/31 @1721  K 3.4  - Received KCl po 6/1 @ 0453 K 3.3, Mg 1.8  6/2 @ 0359 K 3.8, Mg 1.8 6/3 @ 0543 K 3.3 6/4 @ 0414 K 3.5 6/5 @ 0405 K 3.0  - Will give Kcl po x2 and recheck potassium @ 1800 per protocol  Will need to be cautious with supplementation due to renal fucntion.  Pharmacy will continue to monitor and adjust per consult.   Albina Billet, PharmD, BCPS Clinical Pharmacist 04/28/2019 8:28 AM

## 2019-04-28 NOTE — Consult Note (Signed)
Referring Physician: Gouru    Chief Complaint: Right facial droop, lethargy  HPI: Sarah Sullivan is an 73 y.o. female admitted with GIB and hypertension who today as noted to be lethargic and have a right facial droop.  Code stroke was called at that time.  Initial NIHSS of 0.   Patient hypertensive and in paroxysmal afib today.    Date last known well: Date: 04/28/2019 Time last known well: Time: 10:00 tPA Given: No: GIB with recent transfusion, minimal symptoms  Past Medical History:  Diagnosis Date  . Anemia   . Hypertension     Past Surgical History:  Procedure Laterality Date  . ABDOMINAL HYSTERECTOMY    . COLONOSCOPY N/A 04/26/2019   Procedure: COLONOSCOPY;  Surgeon: Toney Reil, MD;  Location: [facility] ENDOSCOPY;  Service: Gastroenterology;  Laterality: N/A;  . ESOPHAGOGASTRODUODENOSCOPY N/A 04/25/2019   Procedure: ESOPHAGOGASTRODUODENOSCOPY (EGD);  Surgeon: Toney Reil, MD;  Location: [facility] ENDOSCOPY;  Service: Gastroenterology;  Laterality: N/A;  . ESOPHAGOGASTRODUODENOSCOPY N/A 04/26/2019   Procedure: ESOPHAGOGASTRODUODENOSCOPY (EGD);  Surgeon: Toney Reil, MD;  Location: [facility] ENDOSCOPY;  Service: Gastroenterology;  Laterality: N/A;  . TEMPORARY DIALYSIS CATHETER N/A 04/21/2019   Procedure: TEMPORARY DIALYSIS CATHETER;  Surgeon: Annice Needy, MD;  Location: [facility] INVASIVE CV LAB;  Service: Cardiovascular;  Laterality: N/A;    History reviewed. No pertinent family history. Social History:  reports that she has never smoked. She has never used smokeless tobacco. She reports previous alcohol use. She reports previous drug use.  Allergies: No Known Allergies  Medications:  I have reviewed the patient's current medications. Prior to Admission:  Medications Prior to Admission  Medication Sig Dispense Refill Last Dose  . aspirin 81 MG chewable tablet Chew 81 mg by mouth daily.   unknown at unknown  . atorvastatin (LIPITOR) 80 MG tablet Take 80 mg by mouth daily.    unknown at unknown  . chlorthalidone (HYGROTON) 25 MG tablet Take 25 mg by mouth daily.   unknown at unknown  . desvenlafaxine (PRISTIQ) 50 MG 24 hr tablet Take 50 mg by mouth daily.   unknown at unknown  . felodipine (PLENDIL) 10 MG 24 hr tablet Take 10 mg by mouth daily.   unknown at unknown  . losartan (COZAAR) 50 MG tablet Take 50 mg by mouth daily.   unknown at unknown  . meloxicam (MOBIC) 15 MG tablet Take 15 mg by mouth daily as needed for pain.    unknown at unknown  . simvastatin (ZOCOR) 20 MG tablet Take 20 mg by mouth daily.      Scheduled: . aspirin  81 mg Oral Daily  . epoetin (EPOGEN/PROCRIT) injection  10,000 Units Subcutaneous Weekly  . feeding supplement  1 Container Oral TID BM  . metoprolol tartrate  12.5 mg Oral BID  . multivitamin  1 tablet Oral QHS  . pantoprazole  40 mg Oral BID AC  . sodium chloride flush  10-40 mL Intracatheter Q12H  . ursodiol  300 mg Oral BID  . venlafaxine XR  37.5 mg Oral Q breakfast    ROS: History obtained from the patient  General ROS: negative for - chills, fatigue, fever, night sweats, weight gain or weight loss Psychological ROS: negative for - behavioral disorder, hallucinations, memory difficulties, mood swings or suicidal ideation Ophthalmic ROS: negative for - blurry vision, double vision, eye pain or loss of vision ENT ROS: negative for - epistaxis, nasal discharge, oral lesions, sore throat, tinnitus or vertigo Allergy and Immunology ROS: negative for -  hives or itchy/watery eyes Hematological and Lymphatic ROS: negative for - bleeding problems, bruising or swollen lymph nodes Endocrine ROS: negative for - galactorrhea, hair pattern changes, polydipsia/polyuria or temperature intolerance Respiratory ROS: negative for - cough, hemoptysis, shortness of breath or wheezing Cardiovascular ROS: LE edema Gastrointestinal ROS: negative for - abdominal pain, diarrhea, hematemesis, nausea/vomiting or stool incontinence Genito-Urinary  ROS: negative for - dysuria, hematuria, incontinence or urinary frequency/urgency Musculoskeletal ROS: negative for - joint swelling or muscular weakness Neurological ROS: as noted in HPI Dermatological ROS: negative for rash and skin lesion changes  Physical Examination: Blood pressure (!) 174/77, pulse 71, temperature 98.5 F (36.9 C), temperature source Oral, resp. rate 14, height  (1.499 m), weight 57.4 kg, SpO2 94 %.  HEENT-  Normocephalic, no lesions, without obvious abnormality.  Normal external eye and conjunctiva.  Normal TM's bilaterally.  Normal auditory canals and external ears. Normal external nose, mucus membranes and septum.  Normal pharynx. Cardiovascular- S1, S2 normal, pulses palpable throughout   Lungs- chest clear, no wheezing, rales, normal symmetric air entry Abdomen- soft, non-tender; bowel sounds normal; no masses,  no organomegaly Extremities- BLE edema Lymph-no adenopathy palpable Musculoskeletal-no joint tenderness, deformity or swelling Skin-warm and dry, no hyperpigmentation, vitiligo, or suspicious lesions  Neurological Examination   Mental Status: Alert, oriented, thought content appropriate.  Speech fluent without evidence of aphasia.  Able to follow 3 step commands without difficulty. Cranial Nerves: II: Discs flat bilaterally; Visual fields grossly normal, pupils equal, round, reactive to light and accommodation III,IV, VI: ptosis not present, extra-ocular motions intact bilaterally V,VII: smile symmetric, facial light touch sensation normal bilaterally VIII: hearing normal bilaterally IX,X: gag reflex present XI: bilateral shoulder shrug XII: midline tongue extension Motor: Right : Upper extremity   5/5    Left:     Upper extremity   5/5  Lower extremity   5/5     Lower extremity   5/5 Tone and bulk:normal tone throughout; no atrophy noted Sensory: Pinprick and light touch intact throughout, bilaterally Deep Tendon Reflexes: Symmetric  throughout Plantars: Right: downgoing   Left: downgoing Cerebellar: Normal finger-to-nose and normal heel-to-shin testing bilaterally Gait: normal gait and station    Laboratory Studies:  Basic Metabolic Panel: Recent Labs  Lab 04/23/19 0414  04/24/19 0453 04/25/19 0359 04/26/19 0543 04/27/19 0414 04/28/19 0405  NA 143  --  144 144 145 146* 146*  K 2.8*   < > 3.3* 3.8 3.3* 3.5 3.0*  CL 102  --  105 107 108 110 109  CO2 28  --  GLUCOSE 109*  --  88 143* 130* 123* 100*  BUN 53*  --  52* 58* 55* 51* 50*  CREATININE 4.10*  --  4.49* 4.25* 3.80* 3.22* 2.97*  CALCIUM 7.8*  --  8.0* 8.1* 7.9* 8.2* 8.2*  MG 1.8  --  1.8 1.8  --   --  1.6*   < > = values in this interval not displayed.    Liver Function Tests: Recent Labs  Lab 04/22/19 0245 04/28/19 0405  AST 146* 40  ALT 108* 58*  ALKPHOS 672* 307*  BILITOT 0.9 0.8  PROT 5.5* 6.0*  ALBUMIN 3.1* 2.9*   No results for input(s): LIPASE, AMYLASE in the last 168 hours. No results for input(s): AMMONIA in the last 168 hours.  CBC: Recent Labs  Lab 04/24/19 0453 04/24/19 1913 04/25/19 0359 04/25/19 1755 04/25/19 2254 04/26/19 0543 04/27/19 0411 04/28/19 0405  WBC 10.8*  7.4 7.6  --   --  8.5  --  8.6  HGB 7.4* 8.2* 7.3* 7.6* 7.4* 6.9* 8.0* 8.4*  HCT 22.6* 24.7* 22.3* 23.0* 22.5* 21.6* 24.4* 25.8*  MCV 90.4 92.5 93.3  --   --  95.2  --  94.2  PLT 132* 126* 119*  --   --  124*  --  120*    Cardiac Enzymes: No results for input(s): CKTOTAL, CKMB, CKMBINDEX, TROPONINI in the last 168 hours.  BNP: Invalid input(s): POCBNP  CBG: Recent Labs  Lab 04/28/19 1241  GLUCAP 112*    Microbiology: Results for orders placed or performed during the hospital encounter of 04/14/19  SARS Coronavirus 2 (CEPHEID - Performed in [facility] hospital lab), Hosp Order     Status: None   Collection Time: 04/14/19 10:32 AM  Result Value Ref Range Status   SARS Coronavirus 2 NEGATIVE NEGATIVE Final    Comment:  (NOTE) If result is NEGATIVE SARS-CoV-2 target nucleic acids are NOT DETECTED. The SARS-CoV-2 RNA is generally detectable in upper and lower  respiratory specimens during the acute phase of infection. The lowest  concentration of SARS-CoV-2 viral copies this assay can detect is 250  copies / mL. A negative result does not preclude SARS-CoV-2 infection  and should not be used as the sole basis for treatment or other  patient management decisions.  A negative result may occur with  improper specimen collection / handling, submission of specimen other  than nasopharyngeal swab, presence of viral mutation(s) within the  areas targeted by this assay, and inadequate number of viral copies  (<250 copies / mL). A negative result must be combined with clinical  observations, patient history, and epidemiological information. If result is POSITIVE SARS-CoV-2 target nucleic acids are DETECTED. The SARS-CoV-2 RNA is generally detectable in upper and lower  respiratory specimens dur ing the acute phase of infection.  Positive  results are indicative of active infection with SARS-CoV-2.  Clinical  correlation with patient history and other diagnostic information is  necessary to determine patient infection status.  Positive results do  not rule out bacterial infection or co-infection with other viruses. If result is PRESUMPTIVE POSTIVE SARS-CoV-2 nucleic acids MAY BE PRESENT.   A presumptive positive result was obtained on the submitted specimen  and confirmed on repeat testing.  While 2019 novel coronavirus  (SARS-CoV-2) nucleic acids may be present in the submitted sample  additional confirmatory testing may be necessary for epidemiological  and / or clinical management purposes  to differentiate between  SARS-CoV-2 and other Sarbecovirus currently known to infect humans.  If clinically indicated additional testing with an alternate test  methodology [phone]([id number]) is advised. The SARS-CoV-2 RNA is  generally  detectable in upper and lower respiratory sp ecimens during the acute  phase of infection. The expected result is Negative. Fact Sheet for Patients:  BoilerBrush.com.cyhttps://www.fda.gov/media/136312/download Fact Sheet for Healthcare Providers: https://pope.com/https://www.fda.gov/media/136313/download This test is not yet approved or cleared by the [place] FDA and has been authorized for detection and/or diagnosis of SARS-CoV-2 by FDA under an Emergency Use Authorization (EUA).  This EUA will remain in effect (meaning this test can be used) for the duration of the COVID-19 declaration under Section 564(b)(1) of the Act, 21 U.S.C. section 360bbb-3(b)(1), unless the authorization is terminated or revoked sooner. Performed at [facility], [address] [place], [place] [address]   MRSA PCR Screening     Status: None   Collection Time: 04/14/19  7:22 PM  Result Value Ref Range Status   MRSA by PCR NEGATIVE NEGATIVE Final    Comment:        The GeneXpert MRSA Assay (FDA approved for NASAL specimens only), is one component of a comprehensive MRSA colonization surveillance program. It is not intended to diagnose MRSA infection nor to guide or monitor treatment for MRSA infections. Performed at [facility], [address] [place], [place] [address]   CULTURE, BLOOD (ROUTINE X 2) w Reflex to ID Panel     Status: None   Collection Time: 04/16/19  7:47 AM  Result Value Ref Range Status   Specimen Description BLOOD BLOOD RIGHT HAND  Final   Special Requests   Final    BOTTLES DRAWN AEROBIC AND ANAEROBIC Blood Culture adequate volume   Culture   Final    NO GROWTH 5 DAYS Performed at [facility], [address] [place], [place] [address]    Report Status 04/21/2019 FINAL  Final  CULTURE, BLOOD (ROUTINE X 2) w Reflex to ID Panel     Status: None   Collection Time: 04/16/19  9:27 AM  Result Value Ref Range Status   Specimen Description BLOOD BLOOD RIGHT HAND   Final   Special Requests   Final    BOTTLES DRAWN AEROBIC ONLY Blood Culture adequate volume   Culture   Final    NO GROWTH 5 DAYS Performed at [facility], [address] [place], [place] [address]    Report Status 04/21/2019 FINAL  Final  MRSA PCR Screening     Status: None   Collection Time: 04/17/19  1:47 PM  Result Value Ref Range Status   MRSA by PCR NEGATIVE NEGATIVE Final    Comment:        The GeneXpert MRSA Assay (FDA approved for NASAL specimens only), is one component of a comprehensive MRSA colonization surveillance program. It is not intended to diagnose MRSA infection nor to guide or monitor treatment for MRSA infections. Performed at [facility], [address] [place], [place] [address]   Gastrointestinal Panel by PCR , Stool     Status: Abnormal   Collection Time: 04/17/19 10:42 PM  Result Value Ref Range Status   Campylobacter species NOT DETECTED NOT DETECTED Final   Plesimonas shigelloides NOT DETECTED NOT DETECTED Final   Salmonella species NOT DETECTED NOT DETECTED Final   Yersinia enterocolitica NOT DETECTED NOT DETECTED Final   Vibrio species NOT DETECTED NOT DETECTED Final   Vibrio cholerae NOT DETECTED NOT DETECTED Final   Enteroaggregative E coli (EAEC) NOT DETECTED NOT DETECTED Final   Enteropathogenic E coli (EPEC) DETECTED (A) NOT DETECTED Final    Comment: RESULT CALLED TO, READ BACK BY AND VERIFIED WITH: BETH BUONO AT 0211 04/19/2019 SDR    Enterotoxigenic E coli (ETEC) NOT DETECTED NOT DETECTED Final   Shiga like toxin producing E coli (STEC) NOT DETECTED NOT DETECTED Final   Shigella/Enteroinvasive E coli (EIEC) NOT DETECTED NOT DETECTED Final   Cryptosporidium NOT DETECTED NOT DETECTED Final   Cyclospora cayetanensis NOT DETECTED NOT DETECTED Final   Entamoeba histolytica NOT DETECTED NOT DETECTED Final   Giardia lamblia NOT DETECTED NOT DETECTED Final   Adenovirus F40/41 NOT DETECTED NOT DETECTED Final    Astrovirus NOT DETECTED NOT DETECTED Final   Norovirus GI/GII NOT DETECTED NOT DETECTED Final   Rotavirus A NOT DETECTED NOT DETECTED Final   Sapovirus (I, II, IV, and V) NOT DETECTED NOT DETECTED Final  Comment: Performed at [facility], [address] [place], [place] [address]  C difficile quick scan w PCR reflex     Status: None   Collection Time: 04/18/19 10:42 PM  Result Value Ref Range Status   C Diff antigen NEGATIVE NEGATIVE Final   C Diff toxin NEGATIVE NEGATIVE Final   C Diff interpretation No C. difficile detected.  Final    Comment: Performed at [facility], [address] [place], [place] [address]  Novel Coronavirus, NAA (hospital order; send-out to ref lab)     Status: None   Collection Time: 04/25/19  5:04 PM  Result Value Ref Range Status   SARS-CoV-2, NAA NOT DETECTED NOT DETECTED Final    Comment: (NOTE) This test was developed and its performance characteristics determined by [employer]. This test has not been FDA cleared or approved. This test has been authorized by FDA under an Emergency Use Authorization (EUA). This test is only authorized for the duration of time the declaration that circumstances exist justifying the authorization of the emergency use of in vitro diagnostic tests for detection of SARS-CoV-2 virus and/or diagnosis of COVID-19 infection under section 564(b)(1) of the Act, 21 U.S.C. 578ION-6(E)(9), unless the authorization is terminated or revoked sooner. When diagnostic testing is negative, the possibility of a false negative result should be considered in the context of a patient's recent exposures and the presence of clinical signs and symptoms consistent with COVID-19. An individual without symptoms of COVID-19 and who is not shedding SARS-CoV-2 virus would expect to have a negative (not detected) result in this assay. Performed  At: [facility] [address] [place], [place]  [id number] Jolene Schimke MD WN:[id number]    Coronavirus Source NASOPHARYNGEAL  Final    Comment: Performed at [facility], [address] [place], [place] [address]    Coagulation Studies: No results for input(s): LABPROT, INR in the last 72 hours.  Urinalysis: No results for input(s): COLORURINE, LABSPEC, PHURINE, GLUCOSEU, HGBUR, BILIRUBINUR, KETONESUR, PROTEINUR, UROBILINOGEN, NITRITE, LEUKOCYTESUR in the last 168 hours.  Invalid input(s): APPERANCEUR  Lipid Panel: No results found for: CHOL, TRIG, HDL, CHOLHDL, VLDL, LDLCALC  HgbA1C: No results found for: HGBA1C  Urine Drug Screen:  No results found for: LABOPIA, COCAINSCRNUR, LABBENZ, AMPHETMU, THCU, LABBARB  Alcohol Level: No results for input(s): ETH in the last 168 hours.   Imaging: No results found.  Assessment: 73 y.o. female admitted with GIB found to be hypertensive and have PAF as well who today was noted to be more lethargic and have a right facial droop by nursing.  Code stroke called at that time.   Initial NIHSS of 0.  Head CT reviewed and shows no acute changes.  Patient not a tPA candidate due to recent GIB requiring transfusion during this hospitalization.  Low NIHSS with poor renal function therefore no CTA imaging or intervention indicated.   Echocardiogram from 5/24 shows EF of 60-65% with no cardiac source of emboli identified.    Stroke Risk Factors - atrial fibrillation and hypertension  Plan: 1. HgbA1c, fasting lipid panel 2. MRI, MRA  of the brain without contrast 3. PT consult, OT consult, Speech consult 4. Carotid dopplers 5. Prophylactic therapy-Due to GIB patient not an anticoagulation or antiplatelet candidate.  Once stable would consider low dose NOAC 6. NPO until RN stroke swallow screen 7. Telemetry monitoring 8. Frequent neuro checks   Thana Farr, MD Neurology [phone] 04/28/2019, 1:15 PM

## 2019-04-28 NOTE — Progress Notes (Signed)
Report given to 1C RN, Corrie Dandy. Patient transported to room 131.

## 2019-04-28 NOTE — Progress Notes (Signed)
Sarah Sullivan R [name], MD [address]  [address]  [place], [place] [address]  Main: [phone]  Fax: [phone] Pager: [phone]   Subjective: Patient reports doing well this morning.  She noticed some bloodArlyss Sullivan per rectum on wiping.  She has been making good urine.  Her p.o. intake is good.  She underwent colonoscopy yesterday   Objective: Vital signs in last 24 hours: Vitals:   04/28/19 1415 04/28/19 1430 04/28/19 1445 04/28/19 1500  BP:   (!) 101/53 120/62  Pulse: 63 (!) 49 67 64  Resp: 13 13 13  (!) 9  Temp:      TempSrc:      SpO2: 96% 96% 96% 98%  Weight:      Height:       Weight change:   Intake/Output Summary (Last 24 hours) at 04/28/2019 1710 Last data filed at 04/28/2019 1001 Gross per 24 hour  Intake 490 ml  Output -  Net 490 ml     Exam: Heart:: Regular rate and rhythm or S1S2 present Lungs: normal and clear to auscultation Abdomen: soft, nontender, normal bowel sounds   Lab Results: CBC Latest Ref Rng & Units 04/28/2019 04/27/2019 04/26/2019  WBC 4.0 - 10.5 K/uL 8.6 - 8.5  Hemoglobin 12.0 - 15.0 g/dL 9.6(E8.4(L) 9.5(M8.0(L) 6.9(L)  Hematocrit 36.0 - 46.0 % 25.8(L) 24.4(L) 21.6(L)  Platelets 150 - 400 K/uL 120(L) - 124(L)   CMP Latest Ref Rng & Units 04/28/2019 04/27/2019 04/26/2019  Glucose 70 - 99 mg/dL 841(L100(H) 244(W123(H) 102(V130(H)  BUN 8 - 23 mg/dL 25(D50(H) 66(Y51(H) 40(H55(H)  Creatinine 0.44 - 1.00 mg/dL 4.74(Q2.97(H) 5.95(G3.22(H) 3.87(F3.80(H)  Sodium 135 - 145 mmol/L 146(H) 146(H) 145  Potassium 3.5 - 5.1 mmol/L 3.0(L) 3.5 3.3(L)  Chloride 98 - 111 mmol/L 109 110 108  CO2 22 - 32 mmol/L 26 25 26   Calcium 8.9 - 10.3 mg/dL 8.2(L) 8.2(L) 7.9(L)  Total Protein 6.5 - 8.1 g/dL 6.0(L) - -  Total Bilirubin 0.3 - 1.2 mg/dL 0.8 - -  Alkaline Phos 38 - 126 U/L 307(H) - -  AST 15 - 41 U/L 40 - -  ALT 0 - 44 U/L 58(H) - -    Micro Results: Recent Results (from the past 240 hour(s))  C difficile quick scan w PCR reflex     Status: None   Collection Time: 04/18/19 10:42 PM  Result Value Ref Range  Status   C Diff antigen NEGATIVE NEGATIVE Final   C Diff toxin NEGATIVE NEGATIVE Final   C Diff interpretation No C. difficile detected.  Final    Comment: Performed at [facility], [address] [place], [place] [address]  Novel Coronavirus, NAA (hospital order; send-out to ref lab)     Status: None   Collection Time: 04/25/19  5:04 PM  Result Value Ref Range Status   SARS-CoV-2, NAA NOT DETECTED NOT DETECTED Final    Comment: (NOTE) This test was developed and its performance characteristics determined by [facility]. This test has not been FDA cleared or approved. This test has been authorized by FDA under an Emergency Use Authorization (EUA). This test is only authorized for the duration of time the declaration that circumstances exist justifying the authorization of the emergency use of in vitro diagnostic tests for detection of SARS-CoV-2 virus and/or diagnosis of COVID-19 infection under section 564(b)(1) of the Act, 21 U.S.C. 951OAC-1(Y)(6360bbb-3(b)(1), unless the authorization is terminated or revoked sooner. When diagnostic testing is negative, the possibility of a false negative result should  be considered in the context of a patient's recent exposures and the presence of clinical signs and symptoms consistent with COVID-19. An individual without symptoms of COVID-19 and who is not shedding SARS-CoV-2 virus would expect to have a negative (not detected) result in this assay. Performed  At: [facility] [address] [place], [place] [id number] Jolene Schimke MD WU:[id number]    Coronavirus Source NASOPHARYNGEAL  Final    Comment: Performed at [facility], [address] [place] [place], [place] [address]   Studies/Results: US Carotid Bilateral  Result Date: 04/28/2019 CLINICAL DATA:  TIA symptoms, hypertension EXAM: BILATERAL CAROTID DUPLEX ULTRASOUND TECHNIQUE: Wallace Cullens scale imaging, color Doppler and duplex ultrasound were performed of bilateral  carotid and vertebral arteries in the neck. COMPARISON:  None. FINDINGS: Criteria: Quantification of carotid stenosis is based on velocity parameters that correlate the residual internal carotid diameter with NASCET-based stenosis levels, using the diameter of the distal internal carotid lumen as the denominator for stenosis measurement. The following velocity measurements were obtained: RIGHT ICA: 92/30 cm/sec CCA: 80/17 cm/sec SYSTOLIC ICA/CCA RATIO:  1.15 ECA: 78 cm/sec LEFT ICA: 93/24 cm/sec CCA: 79/17 cm/sec SYSTOLIC ICA/CCA RATIO:  1.17 ECA: 64 cm/sec RIGHT CAROTID ARTERY: Minor intimal thickening and atherosclerotic change. No hemodynamically significant right ICA stenosis, velocity elevation, or turbulent flow. Degree of narrowing less than 50%. RIGHT VERTEBRAL ARTERY:  Antegrade LEFT CAROTID ARTERY: Similar scattered minor intimal thickening and atherosclerotic change. No hemodynamically significant left ICA stenosis, velocity elevation, or turbulent flow. LEFT VERTEBRAL ARTERY:  Antegrade IMPRESSION: Minor carotid atherosclerosis. No hemodynamically significant ICA stenosis. Degree of narrowing less than 50% bilaterally by ultrasound criteria. Patent antegrade vertebral flow bilaterally Electronically Signed   By: Judie Petit.  Shick M.D.   On: 04/28/2019 16:30   Ct Head Code Stroke Wo Contrast`  Result Date: 04/28/2019 CLINICAL DATA:  Code stroke. 73 year old female with right facial droop and altered mental status. EXAM: CT HEAD WITHOUT CONTRAST TECHNIQUE: Contiguous axial images were obtained from the base of the skull through the vertex without intravenous contrast. COMPARISON:  None. FINDINGS: Brain: Cerebral volume is within normal limits for age. No midline shift, ventriculomegaly, mass effect, evidence of mass lesion, intracranial hemorrhage or evidence of cortically based acute infarction. Gray-white matter differentiation is within normal limits throughout the brain. No cortical encephalomalacia.  Vascular: Calcified atherosclerosis at the skull base. No suspicious intracranial vascular hyperdensity. Skull: No acute osseous abnormality identified. Sinuses/Orbits: Chronic appearing left mastoid sclerosis. Visible paranasal sinuses and right mastoids are well pneumatized. Other: No acute orbit or scalp soft tissue findings. ASPECTS [facility] Stroke Program Early CT Score) - Ganglionic level infarction (caudate, lentiform nuclei, internal capsule, insula, M1-M3 cortex): 7 - Supraganglionic infarction (M4-M6 cortex): 3 Total score (0-10 with 10 being normal): 10 IMPRESSION: 1. Normal for age non contrast CT appearance of the brain. ASPECTS is 10. 2. Study discussed by telephone with Dr. Thad Ranger with Neurology on 04/28/2019 at 13:20 . Electronically Signed   By: Odessa Fleming M.D.   On: 04/28/2019 13:20   Medications:  I have reviewed the patient's current medications. Prior to Admission:  Medications Prior to Admission  Medication Sig Dispense Refill Last Dose  . aspirin 81 MG chewable tablet Chew 81 mg by mouth daily.   unknown at unknown  . atorvastatin (LIPITOR) 80 MG tablet Take 80 mg by mouth daily.   unknown at unknown  . chlorthalidone (HYGROTON) 25 MG tablet Take 25 mg by mouth daily.   unknown at unknown  . desvenlafaxine (PRISTIQ)  50 MG 24 hr tablet Take 50 mg by mouth daily.   unknown at unknown  . felodipine (PLENDIL) 10 MG 24 hr tablet Take 10 mg by mouth daily.   unknown at unknown  . losartan (COZAAR) 50 MG tablet Take 50 mg by mouth daily.   unknown at unknown  . meloxicam (MOBIC) 15 MG tablet Take 15 mg by mouth daily as needed for pain.    unknown at unknown  . simvastatin (ZOCOR) 20 MG tablet Take 20 mg by mouth daily.      Scheduled: . aspirin  81 mg Oral Daily  . epoetin (EPOGEN/PROCRIT) injection  10,000 Units Subcutaneous Weekly  . feeding supplement  1 Container Oral TID BM  . metoprolol tartrate  12.5 mg Oral BID  . multivitamin  1 tablet Oral QHS  . pantoprazole  40 mg Oral  BID AC  . sodium chloride flush  10-40 mL Intracatheter Q12H  . ursodiol  300 mg Oral BID  . venlafaxine XR  37.5 mg Oral Q breakfast   Continuous: . sodium chloride 250 mL (04/28/19 1445)  . diltiazem (CARDIZEM) infusion Stopped (04/28/19 1322)  . sodium chloride     VXB:LTJQZE chloride, meclizine, metoprolol tartrate, metoprolol tartrate, ondansetron **OR** ondansetron (ZOFRAN) IV, senna-docusate, sodium chloride flush, sodium chloride flush Anti-infectives (From admission, onward)   Start     Dose/Rate Route Frequency Ordered Stop   04/21/19 1000  ciprofloxacin (CIPRO) tablet 500 mg  Status:  Discontinued     500 mg Oral Daily 04/20/19 0952 04/28/19 1250   04/19/19 1500  ciprofloxacin (CIPRO) tablet 500 mg  Status:  Discontinued     500 mg Oral 2 times daily 04/19/19 1407 04/20/19 0952   04/19/19 1400  azithromycin (ZITHROMAX) tablet 500 mg  Status:  Discontinued     500 mg Oral Daily 04/19/19 1353 04/19/19 1406   04/17/19 2300  cefTRIAXone (ROCEPHIN) 2 g in sodium chloride 0.9 % 100 mL IVPB  Status:  Discontinued     2 g 200 mL/hr over 30 Minutes Intravenous Every 24 hours 04/17/19 2255 04/19/19 1410   04/17/19 2300  metroNIDAZOLE (FLAGYL) IVPB 500 mg  Status:  Discontinued     500 mg 100 mL/hr over 60 Minutes Intravenous Every 8 hours 04/17/19 2255 04/19/19 1410   04/15/19 1315  piperacillin-tazobactam (ZOSYN) IVPB 3.375 g  Status:  Discontinued     3.375 g 12.5 mL/hr over 240 Minutes Intravenous Every 12 hours 04/15/19 1312 04/17/19 1820     Scheduled Meds: . aspirin  81 mg Oral Daily  . epoetin (EPOGEN/PROCRIT) injection  10,000 Units Subcutaneous Weekly  . feeding supplement  1 Container Oral TID BM  . metoprolol tartrate  12.5 mg Oral BID  . multivitamin  1 tablet Oral QHS  . pantoprazole  40 mg Oral BID AC  . sodium chloride flush  10-40 mL Intracatheter Q12H  . ursodiol  300 mg Oral BID  . venlafaxine XR  37.5 mg Oral Q breakfast   Continuous Infusions: . sodium  chloride 250 mL (04/28/19 1445)  . diltiazem (CARDIZEM) infusion Stopped (04/28/19 1322)  . sodium chloride     PRN Meds:.sodium chloride, meclizine, metoprolol tartrate, metoprolol tartrate, ondansetron **OR** ondansetron (ZOFRAN) IV, senna-docusate, sodium chloride flush, sodium chloride flush   Assessment: Active Problems:   Hypokalemia   Colitis   Dehydration   Thrombocytopenia (HCC)   Acute renal failure (HCC)   Abnormal LFTs   Anemia due to chronic blood loss   Anemia  Peptic ulcer disease Primary biliary cirrhosis, significantly elevated AMA levels AKI improving  Plan: Peptic ulcer disease: The source of anemia No active GI bleed Continue Protonix 40 mg twice daily long-term Avoid NSAIDs Follow-up on pathology results Follow-up with me in my office in 2 to 3 weeks after discharge  Cirrhosis, elevated LFTs work-up positive for significantly elevated AMA levels Recommend ursodiol 300 mg twice daily Recheck LFTs as outpatient Avoid NSAIDs  Will sign off at this time, please call us back with questions or concerns   LOS: 12 days   Maci Eickholt 04/28/2019, 5:10 PM

## 2019-04-28 NOTE — Plan of Care (Signed)
Pt has elevated BP - she had received hydralazine IV push at around 0645 this am and I gave 25 mg PO hydralazine with her morning meds.  When PT went to work w/her, [employer] tele called and said her heart rate was in the 170s.  Saw pt and PT and let them know what was going on - she had just walked to the door.  Let Dr. Amado Coe know that she was sustaining in the 140s.  I did notice an irregular heart rhythm when I did my morning assessment. Went into assess the patient and she appeared to have right sided facial droop.  She was alert and oriented and followed directions.  We check blood sugar 112 and decided to call a rapid because pt was more lethargic than she was earlier.  Dr. Thad Ranger was called to the bedside and because of the Afib and lethargy, it was decided to call the code stroke and pt was transferred for a stat CT and up to ICU.

## 2019-04-28 NOTE — Consult Note (Signed)
[facility] Cardiology Consultation Note  Patient ID: Sarah Sullivan, MRN: [id number], DOB/AGE: May 19, 1946 73 y.o. Admit date: 04/14/2019   Date of Consult: 04/28/2019 Primary Physician: Titus Mould, NP Primary Cardiologist: None  Chief Complaint:  Chief Complaint  Patient presents with  . Nausea  . Emesis   Reason for Consult: Atrial fibrillation with rapid ventricular rate with stroke  HPI: 73 y.o. female with known essential hypertension and acute renal failure for which the patient has had dialysis catheter placed.  The patient has had a stroke for which the patient has had appropriate medical therapy although recently has had a GI bleed requiring blood transfusion.  The patient was recovering fairly well in inpatient rehabilitation when the patient had a significant difficulties with facial droop as well as a rapid ventricular rate and tachycardia.  The patient had a CT scan which.  Not to be having an acute changes and therefore did not receive TPA.  Additionally the patient had apparent atrial fibrillation with rapid ventricular rate although no EKGs or telemetry have revealed this issue.  Currently the patient does have a telemetry showing normal sinus rhythm.  There is also an apparent history of paroxysmal atrial fibrillation although it is not well documented in her previous notes or shown any EKG in the past.  Currently the patient has some difficulty with discussion and speech although appears to be hemodynamically stable at this time.  Echocardiogram has shown normal LV systolic function and no evidence of primary source of embolism.  Past Medical History:  Diagnosis Date  . Anemia   . Hypertension       Surgical History:  Past Surgical History:  Procedure Laterality Date  . ABDOMINAL HYSTERECTOMY    . COLONOSCOPY N/A 04/26/2019   Procedure: COLONOSCOPY;  Surgeon: Toney Reil, MD;  Location: [facility] ENDOSCOPY;  Service: Gastroenterology;  Laterality: N/A;  .  ESOPHAGOGASTRODUODENOSCOPY N/A 04/25/2019   Procedure: ESOPHAGOGASTRODUODENOSCOPY (EGD);  Surgeon: Toney Reil, MD;  Location: [facility] ENDOSCOPY;  Service: Gastroenterology;  Laterality: N/A;  . ESOPHAGOGASTRODUODENOSCOPY N/A 04/26/2019   Procedure: ESOPHAGOGASTRODUODENOSCOPY (EGD);  Surgeon: Toney Reil, MD;  Location: [facility] ENDOSCOPY;  Service: Gastroenterology;  Laterality: N/A;  . TEMPORARY DIALYSIS CATHETER N/A 04/21/2019   Procedure: TEMPORARY DIALYSIS CATHETER;  Surgeon: Annice Needy, MD;  Location: [facility] INVASIVE CV LAB;  Service: Cardiovascular;  Laterality: N/A;     Home Meds: Prior to Admission medications   Medication Sig Start Date End Date Taking? Authorizing Provider  aspirin 81 MG chewable tablet Chew 81 mg by mouth daily.   Yes [provider]  atorvastatin (LIPITOR) 80 MG tablet Take 80 mg by mouth daily.   Yes [provider]  chlorthalidone (HYGROTON) 25 MG tablet Take 25 mg by mouth daily.   Yes [provider]  desvenlafaxine (PRISTIQ) 50 MG 24 hr tablet Take 50 mg by mouth daily.   Yes [provider]  felodipine (PLENDIL) 10 MG 24 hr tablet Take 10 mg by mouth daily.   Yes [provider]  losartan (COZAAR) 50 MG tablet Take 50 mg by mouth daily.   Yes [provider]  meloxicam (MOBIC) 15 MG tablet Take 15 mg by mouth daily as needed for pain.    Yes [provider]  simvastatin (ZOCOR) 20 MG tablet Take 20 mg by mouth daily.    [provider]    Inpatient Medications:  . aspirin  81 mg Oral Daily  . epoetin (EPOGEN/PROCRIT) injection  10,000 Units  Subcutaneous Weekly  . feeding supplement  1 Container Oral TID BM  . metoprolol tartrate  12.5 mg Oral BID  . multivitamin  1 tablet Oral QHS  . pantoprazole  40 mg Oral BID AC  . sodium chloride flush  10-40 mL Intracatheter Q12H  . ursodiol  300 mg Oral BID  . venlafaxine XR  37.5 mg Oral Q breakfast   . sodium chloride 250 mL  (04/28/19 1445)  . diltiazem (CARDIZEM) infusion Stopped (04/28/19 1322)  . magnesium sulfate bolus IVPB 2 g (04/28/19 1447)  . sodium chloride      Allergies: No Known Allergies  Social History   Socioeconomic History  . Marital status: Married    Spouse name: Not on file  . Number of children: Not on file  . Years of education: Not on file  . Highest education level: Not on file  Occupational History  . Not on file  Social Needs  . Financial resource strain: Not on file  . Food insecurity:    Worry: Not on file    Inability: Not on file  . Transportation needs:    Medical: Not on file    Non-medical: Not on file  Tobacco Use  . Smoking status: Never Smoker  . Smokeless tobacco: Never Used  Substance and Sexual Activity  . Alcohol use: Not Currently  . Drug use: Not Currently  . Sexual activity: Not on file  Lifestyle  . Physical activity:    Days per week: Not on file    Minutes per session: Not on file  . Stress: Not on file  Relationships  . Social connections:    Talks on phone: Not on file    Gets together: Not on file    Attends religious service: Not on file    Active member of club or organization: Not on file    Attends meetings of clubs or organizations: Not on file    Relationship status: Not on file  . Intimate partner violence:    Fear of current or ex partner: Not on file    Emotionally abused: Not on file    Physically abused: Not on file    Forced sexual activity: Not on file  Other Topics Concern  . Not on file  Social History Narrative  . Not on file     History reviewed. No pertinent family history.   Review of Systems Patient is unable to voice significant concerns   labs: No results for input(s): CKTOTAL, CKMB, TROPONINI in the last 72 hours. Lab Results  Component Value Date   WBC 8.6 04/28/2019   HGB 8.4 (L) 04/28/2019   HCT 25.8 (L) 04/28/2019   MCV 94.2 04/28/2019   PLT 120 (L) 04/28/2019    Recent Labs  Lab  04/28/19 0405  NA 146*  K 3.0*  CL 109  CO2 26  BUN 50*  CREATININE 2.97*  CALCIUM 8.2*  PROT 6.0*  BILITOT 0.8  ALKPHOS 307*  ALT 58*  AST 40  GLUCOSE 100*   No results found for: CHOL, HDL, LDLCALC, TRIG No results found for: DDIMER  Radiology/Studies:  Ct Abdomen Pelvis Wo Contrast  Result Date: 04/15/2019 CLINICAL DATA:  Nausea and vomiting, history of hypertension, dizziness. EXAM: CT ABDOMEN AND PELVIS WITHOUT CONTRAST TECHNIQUE: Multidetector CT imaging of the abdomen and pelvis was performed following the standard protocol without IV contrast. COMPARISON:  None. FINDINGS: Lower chest: No acute abnormality. Hepatobiliary: No focal liver abnormality is seen. No gallstones, gallbladder  wall thickening, or biliary dilatation. Pancreas: Unremarkable. No pancreatic ductal dilatation or surrounding inflammatory changes. Spleen: Normal in size without focal abnormality. Adrenals/Urinary Tract: Adrenal glands appear normal. Kidneys are unremarkable without mass, stone or hydronephrosis. No perinephric fluid. No ureteral or bladder calculi identified. Bladder appears normal, partially decompressed. Stomach/Bowel: No dilated large or small bowel loops. Questionable thickening of the walls of the RIGHT colon and transverse colon with mild pericolonic inflammation suggesting acute colitis. Appendix is normal. Stomach is unremarkable, partially decompressed. Vascular/Lymphatic: Aortic atherosclerosis. No enlarged lymph nodes seen. Reproductive: Presumed hysterectomy. No adnexal mass or free fluid seen. Other: No free fluid or abscess collection. No free intraperitoneal air. Musculoskeletal: No acute or suspicious osseous finding. Fixation hardware at the L3 through L5 levels appears intact and appropriately position. IMPRESSION: 1. Suspected mild colitis of the transverse colon and RIGHT colon. 2. Remainder of the abdomen and pelvis CT is unremarkable. No bowel obstruction. No free fluid or abscess  collection. No evidence of acute solid organ abnormality. Appendix is normal. Aortic Atherosclerosis (ICD10-I70.0). Electronically Signed   By: Bary RichardStan  Maynard M.D.   On: 04/15/2019 11:53   Dg Abd 1 View  Result Date: 04/18/2019 CLINICAL DATA:  Right femoral line placement EXAM: ABDOMEN - 1 VIEW COMPARISON:  Dominant CT 3 days ago FINDINGS: Right femoral line. The tip is obscured by lumbar fusion hardware but is likely at the level of the iliac confluence. Mild gaseous distension of bowel, question mild ileus. Probable anasarca seen at the right flank. IMPRESSION: 1. Femoral line with tip obscured by lumbar fusion hardware. The tip is either at the iliac confluence or IVC. 2. Mild generalized gaseous distension of bowel, there may be developing ileus. Electronically Signed   By: Marnee SpringJonathon  Watts M.D.   On: 04/18/2019 04:27   Dg Abd 1 View  Result Date: 04/14/2019 CLINICAL DATA:  Nausea and vomiting for 3 days. EXAM: ABDOMEN - 1 VIEW COMPARISON:  Chest radiograph same date. FINDINGS: The bowel gas pattern is normal. There is mildly prominent stool in the distal colon. There is no supine evidence of free intraperitoneal air or suspicious abdominal calcification. There are postsurgical changes in the lumbar spine status post L3 through 5 fusion. No acute osseous findings. IMPRESSION: No acute abdominal findings. Mildly increased distal colonic stool burden, suggesting constipation. Electronically Signed   By: Carey BullocksWilliam  Veazey M.D.   On: 04/14/2019 14:32   [place] Renal  Result Date: 04/17/2019 CLINICAL DATA:  Acute renal failure EXAM: RENAL / URINARY TRACT ULTRASOUND COMPLETE COMPARISON:  CT from 04/15/2019 FINDINGS: Right Kidney: Renal measurements: 9.8 x 4.5 x 4.7 cm. = volume: 107 mL . Echogenicity within normal limits. No mass or hydronephrosis visualized. Left Kidney: Renal measurements: 9.7 x 5.1 x 5.6 cm. = volume: 145 mL. Echogenicity within normal limits. No mass or hydronephrosis visualized. Bladder:  Appears normal for degree of bladder distention. Increased echogenicity of the liver is noted consistent with fatty infiltration. Minimal free fluid is noted adjacent to the liver. IMPRESSION: Kidneys are within normal limits. Fatty infiltration of the liver. Minimal free fluid in the abdomen. Electronically Signed   By: Alcide CleverMark  Lukens M.D.   On: 04/17/2019 21:08   Dg Chest Port 1 View  Result Date: 04/14/2019 CLINICAL DATA:  Weakness.  Nausea and vomiting for 3 days. EXAM: PORTABLE CHEST 1 VIEW COMPARISON:  None. FINDINGS: The cardiomediastinal silhouette is within normal limits. The lungs are mildly hypoinflated with mild bronchovascular crowding in the lung bases. No airspace consolidation, edema, pleural  effusion, pneumothorax is identified. No acute osseous abnormality is seen. IMPRESSION: No active disease. Electronically Signed   By: Sebastian Ache M.D.   On: 04/14/2019 11:15   US Liver Doppler  Result Date: 04/19/2019 CLINICAL DATA:  73 year old female with liver disease EXAM: DUPLEX ULTRASOUND OF LIVER TECHNIQUE: Color and duplex Doppler ultrasound was performed to evaluate the hepatic in-flow and out-flow vessels. COMPARISON:  None. FINDINGS: Portal Vein Velocities Main:  45 cm/sec Right:  30 cm/sec Left:  23 cm/sec Hepatic Vein Velocities Right:  33 cm/sec Middle:  28 cm/sec Left:  70 cm/sec Hepatic Artery Velocity:  90 cm/sec Splenic Vein Velocity:  13 cm/sec Varices: Absent Ascites: Ascites present, scant volume. Spleen volume 68 cubic cm. Left-sided pleural effusion. Mildly nodular contour of liver parenchyma with increased echogenicity, and no high-resolution images provided. IMPRESSION: Unremarkable directed duplex of the hepatic vasculature. Mildly nodular contour of the liver suggesting developing cirrhosis, though no high-resolution images were performed. Scant ascites. Electronically Signed   By: Gilmer Mor D.O.   On: 04/19/2019 13:57   Ct Head Code Stroke Wo Contrast`  Result Date:  04/28/2019 CLINICAL DATA:  Code stroke. 73 year old female with right facial droop and altered mental status. EXAM: CT HEAD WITHOUT CONTRAST TECHNIQUE: Contiguous axial images were obtained from the base of the skull through the vertex without intravenous contrast. COMPARISON:  None. FINDINGS: Brain: Cerebral volume is within normal limits for age. No midline shift, ventriculomegaly, mass effect, evidence of mass lesion, intracranial hemorrhage or evidence of cortically based acute infarction. Gray-white matter differentiation is within normal limits throughout the brain. No cortical encephalomalacia. Vascular: Calcified atherosclerosis at the skull base. No suspicious intracranial vascular hyperdensity. Skull: No acute osseous abnormality identified. Sinuses/Orbits: Chronic appearing left mastoid sclerosis. Visible paranasal sinuses and right mastoids are well pneumatized. Other: No acute orbit or scalp soft tissue findings. ASPECTS [facility] Stroke Program Early CT Score) - Ganglionic level infarction (caudate, lentiform nuclei, internal capsule, insula, M1-M3 cortex): 7 - Supraganglionic infarction (M4-M6 cortex): 3 Total score (0-10 with 10 being normal): 10 IMPRESSION: 1. Normal for age non contrast CT appearance of the brain. ASPECTS is 10. 2. Study discussed by telephone with Dr. Thad Ranger with Neurology on 04/28/2019 at 13:20 . Electronically Signed   By: Odessa Fleming M.D.   On: 04/28/2019 13:20   [place] Ekg Site Rite  Result Date: 04/26/2019 If Site Rite image not attached, placement could not be confirmed due to current cardiac rhythm.   EKG: Previously on EKG last week showed normal sinus rhythm with pre-atrial and preventricular contractions  Weights: Filed Weights   04/21/19 1532 04/26/19 1234  Weight: 57.4 kg 57.4 kg     Physical Exam: Blood pressure 120/62, pulse 64, temperature 98.5 F (36.9 C), temperature source Oral, resp. rate (!) 9, height  (1.499 m), weight 57.4 kg, SpO2 98 %. Body  mass index is 25.56 kg/m. General: Well developed, well nourished, in no acute distress. Head eyes ears nose throat: Normocephalic, atraumatic, sclera non-icteric, no xanthomas, nares are without discharge. No apparent thyromegaly and/or mass  Lungs: Normal respiratory effort.  no wheezes, no rales, no rhonchi.  Heart: RRR with normal S1 S2. no murmur gallop, no rub, PMI is normal size and placement, carotid upstroke normal without bruit, jugular venous pressure is normal Abdomen: Soft, non-tender, non-distended with normoactive bowel sounds. No hepatomegaly. No rebound/guarding. No obvious abdominal masses. Abdominal aorta is normal size without bruit Extremities: No edema. no cyanosis, no clubbing, no ulcers  Peripheral :  2+ bilateral upper extremity pulses, 2+ bilateral femoral pulses, 2+ bilateral dorsal pedal pulse Neuro: Alert and not fully oriented.  Patient does have some neurologic deficit musculoskeletal: Normal muscle tone without kyphosis Psych:  Responds to questions appropriately with a normal affect.    Assessment: 73 year old female with peripheral vascular disease and stroke hypertension GI bleed and acute renal failure with apparent previous history of paroxysmal atrial fibrillation having worsening symptoms of stroke and tachycardia currently in normal rhythm needing further treatment without evidence of myocardial infarction or heart failure type symptoms  Plan: 1.  Continue treatment neurologically from stroke as per neurology and critical care 2.  Metoprolol for heart rate control and maintenance of normal sinus rhythm watching closely for hypotension and significant bradycardia 3.  Possible back assessment of telemetry to assess for possible true atrial fibrillation which would definitely change long-term therapy 4.  Further consideration of anticoagulation despite GI bleed depending on significance of GI bleed and healing process as well as further consideration of  watchman device for risk reduction of atrial appendage thrombus and or no use of anticoagulation at all.  This all stems on the diagnosis of atrial fibrillation and full documentation 5.  Continue rehabilitation without restriction  Signed, Lamar Blinks M.D. [facility] Cardiology 04/28/2019, 3:18 PM

## 2019-04-28 NOTE — Progress Notes (Signed)
PT Cancellation Note  Patient Details Name: Sarah Sullivan MRN: [id number] DOB: 09/21/1946   Cancelled Treatment:    Reason Eval/Treat Not Completed: Patient not medically ready Pt transferred to CCU, PT orders will be completed.  If needed, will need new orders when medically ready.  Malachi Pro, DPT 04/28/2019, 2:20 PM

## 2019-04-29 LAB — CBC
HCT: 26.4 % — ABNORMAL LOW (ref 36.0–46.0)
Hemoglobin: 8.5 g/dL — ABNORMAL LOW (ref 12.0–15.0)
MCH: 30.1 pg (ref 26.0–34.0)
MCHC: 32.2 g/dL (ref 30.0–36.0)
MCV: 93.6 fL (ref 80.0–100.0)
Platelets: 122 10*3/uL — ABNORMAL LOW (ref 150–400)
RBC: 2.82 MIL/uL — ABNORMAL LOW (ref 3.87–5.11)
RDW: 16.4 % — ABNORMAL HIGH (ref 11.5–15.5)
WBC: 9.3 10*3/uL (ref 4.0–10.5)
nRBC: 0 % (ref 0.0–0.2)

## 2019-04-29 LAB — HEMOGLOBIN A1C
Hgb A1c MFr Bld: 5.3 % (ref 4.8–5.6)
Mean Plasma Glucose: 105.41 mg/dL

## 2019-04-29 LAB — COMPREHENSIVE METABOLIC PANEL
ALT: 59 U/L — ABNORMAL HIGH (ref 0–44)
AST: 52 U/L — ABNORMAL HIGH (ref 15–41)
Albumin: 3.3 g/dL — ABNORMAL LOW (ref 3.5–5.0)
Alkaline Phosphatase: 322 U/L — ABNORMAL HIGH (ref 38–126)
Anion gap: 12 (ref 5–15)
BUN: 55 mg/dL — ABNORMAL HIGH (ref 8–23)
CO2: 29 mmol/L (ref 22–32)
Calcium: 8.5 mg/dL — ABNORMAL LOW (ref 8.9–10.3)
Chloride: 103 mmol/L (ref 98–111)
Creatinine, Ser: 2.75 mg/dL — ABNORMAL HIGH (ref 0.44–1.00)
GFR calc Af Amer: 19 mL/min — ABNORMAL LOW (ref >60)
GFR calc non Af Amer: 16 mL/min — ABNORMAL LOW (ref >60)
Glucose, Bld: 98 mg/dL (ref 70–99)
Potassium: 2.7 mmol/L — CL (ref 3.5–5.1)
Sodium: 144 mmol/L (ref 135–145)
Total Bilirubin: 0.8 mg/dL (ref 0.3–1.2)
Total Protein: 6.8 g/dL (ref 6.5–8.1)

## 2019-04-29 LAB — LIPID PANEL
Cholesterol: 175 mg/dL (ref 0–200)
HDL: 68 mg/dL (ref >40)
LDL Cholesterol: 88 mg/dL (ref 0–99)
Total CHOL/HDL Ratio: 2.6 RATIO
Triglycerides: 93 mg/dL (ref <150)
VLDL: 19 mg/dL (ref 0–40)

## 2019-04-29 MED ORDER — HYDRALAZINE HCL 20 MG/ML IJ SOLN
10.0000 mg | Freq: Once | INTRAMUSCULAR | Status: AC
  Administered 2019-04-29: 05:00:00 10 mg via INTRAVENOUS
  Filled 2019-04-29: qty 1

## 2019-04-29 MED ORDER — POTASSIUM CHLORIDE CRYS ER 20 MEQ PO TBCR
40.0000 meq | EXTENDED_RELEASE_TABLET | Freq: Once | ORAL | Status: AC
  Administered 2019-04-29: 14:00:00 40 meq via ORAL
  Filled 2019-04-29: qty 2

## 2019-04-29 MED ORDER — NEPRO/CARBSTEADY PO LIQD
237.0000 mL | Freq: Two times a day (BID) | ORAL | Status: DC
  Administered 2019-04-29 – 2019-04-30 (×3): 237 mL via ORAL

## 2019-04-29 MED ORDER — POTASSIUM CHLORIDE CRYS ER 20 MEQ PO TBCR
40.0000 meq | EXTENDED_RELEASE_TABLET | Freq: Once | ORAL | Status: AC
  Administered 2019-04-29: 10:00:00 40 meq via ORAL
  Filled 2019-04-29: qty 2

## 2019-04-29 NOTE — Progress Notes (Signed)
[facility]  ROUNDING NOTE   Subjective:   Denies acute c/o  Reports b/l LE edema has improved significantly some SOB, atrial fibrillation now rate controlled -colonoscopy unremarkable Serum creatinine improving without dialysis  Objective:  Vital signs in last 24 hours:  Temp:  [98.2 F (36.8 C)-98.7 F (37.1 C)] 98.6 F (37 C) (06/06 0948) Pulse Rate:  [49-75] 73 (06/06 0948) Resp:  [9-16] 16 (06/06 0428) BP: (101-187)/(53-74) 164/72 (06/06 0948) SpO2:  [94 %-98 %] 94 % (06/06 0948)  Weight change:  Filed Weights   04/21/19 1532 04/26/19 1234  Weight: 57.4 kg 57.4 kg    Intake/Output: I/O last 3 completed shifts: In: 548.7 [P.O.:480; I.V.:18.7; IV Piggyback:50] Out: -    Intake/Output this shift:  No intake/output data recorded.  Physical Exam: General: NAD,   Head: Normocephalic, atraumatic. Moist oral mucosal membranes  Eyes: Anicteric,    Neck: Supple, trachea midline  Lungs:  Decreased sounds at bases  Heart: Irregular,    Abdomen:  Mild distension, nontender  Extremities: + peripheral edema.  Neurologic: Nonfocal, moving all four extremities  Skin: No lesions       Basic Metabolic Panel: Recent Labs  Lab 04/23/19 0414  04/24/19 0453 04/25/19 0359 04/26/19 0543 04/27/19 0414 04/28/19 0405 04/29/19 0712  NA 143  --  144 144 145 146* 146* 144  K 2.8*   < > 3.3* 3.8 3.3* 3.5 3.0* 2.7*  CL 102  --  105 107 108 110 109 103  CO2 28  --  GLUCOSE 109*  --  88 143* 130* 123* 100* 98  BUN 53*  --  52* 58* 55* 51* 50* 55*  CREATININE 4.10*  --  4.49* 4.25* 3.80* 3.22* 2.97* 2.75*  CALCIUM 7.8*  --  8.0* 8.1* 7.9* 8.2* 8.2* 8.5*  MG 1.8  --  1.8 1.8  --   --  1.6*  --    < > = values in this interval not displayed.    Liver Function Tests: Recent Labs  Lab 04/28/19 0405 04/29/19 0712  AST 40 52*  ALT 58* 59*  ALKPHOS 307* 322*  BILITOT 0.8 0.8  PROT 6.0* 6.8  ALBUMIN 2.9* 3.3*   No results for input(s):  LIPASE, AMYLASE in the last 168 hours. No results for input(s): AMMONIA in the last 168 hours.  CBC: Recent Labs  Lab 04/24/19 1913 04/25/19 0359  04/25/19 2254 04/26/19 0543 04/27/19 0411 04/28/19 0405 04/29/19 0712  WBC 7.4 7.6  --   --  8.5  --  8.6 9.3  HGB 8.2* 7.3*   < > 7.4* 6.9* 8.0* 8.4* 8.5*  HCT 24.7* 22.3*   < > 22.5* 21.6* 24.4* 25.8* 26.4*  MCV 92.5 93.3  --   --  95.2  --  94.2 93.6  PLT 126* 119*  --   --  124*  --  120* 122*   < > = values in this interval not displayed.    Cardiac Enzymes: No results for input(s): CKTOTAL, CKMB, CKMBINDEX, TROPONINI in the last 168 hours.  BNP: Invalid input(s): POCBNP  CBG: Recent Labs  Lab 04/28/19 1241  GLUCAP 112*    Microbiology: Results for orders placed or performed during the hospital encounter of 04/14/19  SARS Coronavirus 2 (CEPHEID - Performed in [facility] hospital lab), Hosp Order     Status: None   Collection Time: 04/14/19 10:32 AM  Result Value Ref Range Status   SARS  Coronavirus 2 NEGATIVE NEGATIVE Final    Comment: (NOTE) If result is NEGATIVE SARS-CoV-2 target nucleic acids are NOT DETECTED. The SARS-CoV-2 RNA is generally detectable in upper and lower  respiratory specimens during the acute phase of infection. The lowest  concentration of SARS-CoV-2 viral copies this assay can detect is 250  copies / mL. A negative result does not preclude SARS-CoV-2 infection  and should not be used as the sole basis for treatment or other  patient management decisions.  A negative result may occur with  improper specimen collection / handling, submission of specimen other  than nasopharyngeal swab, presence of viral mutation(s) within the  areas targeted by this assay, and inadequate number of viral copies  (<250 copies / mL). A negative result must be combined with clinical  observations, patient history, and epidemiological information. If result is POSITIVE SARS-CoV-2 target nucleic acids are  DETECTED. The SARS-CoV-2 RNA is generally detectable in upper and lower  respiratory specimens dur ing the acute phase of infection.  Positive  results are indicative of active infection with SARS-CoV-2.  Clinical  correlation with patient history and other diagnostic information is  necessary to determine patient infection status.  Positive results do  not rule out bacterial infection or co-infection with other viruses. If result is PRESUMPTIVE POSTIVE SARS-CoV-2 nucleic acids MAY BE PRESENT.   A presumptive positive result was obtained on the submitted specimen  and confirmed on repeat testing.  While 2019 novel coronavirus  (SARS-CoV-2) nucleic acids may be present in the submitted sample  additional confirmatory testing may be necessary for epidemiological  and / or clinical management purposes  to differentiate between  SARS-CoV-2 and other Sarbecovirus currently known to infect humans.  If clinically indicated additional testing with an alternate test  methodology [phone]([id number]) is advised. The SARS-CoV-2 RNA is generally  detectable in upper and lower respiratory sp ecimens during the acute  phase of infection. The expected result is Negative. Fact Sheet for Patients:  BoilerBrush.com.cyhttps://www.fda.gov/media/136312/download Fact Sheet for Healthcare Providers: https://pope.com/https://www.fda.gov/media/136313/download This test is not yet approved or cleared by the [place] FDA and has been authorized for detection and/or diagnosis of SARS-CoV-2 by FDA under an Emergency Use Authorization (EUA).  This EUA will remain in effect (meaning this test can be used) for the duration of the COVID-19 declaration under Section 564(b)(1) of the Act, 21 U.S.C. section 360bbb-3(b)(1), unless the authorization is terminated or revoked sooner. Performed at [facility], [address] [place], [place] [address]   MRSA PCR Screening     Status: None   Collection Time: 04/14/19  7:22 PM  Result Value Ref Range  Status   MRSA by PCR NEGATIVE NEGATIVE Final    Comment:        The GeneXpert MRSA Assay (FDA approved for NASAL specimens only), is one component of a comprehensive MRSA colonization surveillance program. It is not intended to diagnose MRSA infection nor to guide or monitor treatment for MRSA infections. Performed at [facility], [address] [place], [place] [address]   CULTURE, BLOOD (ROUTINE X 2) w Reflex to ID Panel     Status: None   Collection Time: 04/16/19  7:47 AM  Result Value Ref Range Status   Specimen Description BLOOD BLOOD RIGHT HAND  Final   Special Requests   Final    BOTTLES DRAWN AEROBIC AND ANAEROBIC Blood Culture adequate volume   Culture   Final    NO GROWTH 5 DAYS Performed at [facility], [address] [place], [place] [address]    Report Status 04/21/2019 FINAL  Final  CULTURE, BLOOD (ROUTINE X 2) w Reflex to ID Panel     Status: None   Collection Time: 04/16/19  9:27 AM  Result Value Ref Range Status   Specimen Description BLOOD BLOOD RIGHT HAND  Final   Special Requests   Final    BOTTLES DRAWN AEROBIC ONLY Blood Culture adequate volume   Culture   Final    NO GROWTH 5 DAYS Performed at [facility], [address] [place], [place] [address]    Report Status 04/21/2019 FINAL  Final  MRSA PCR Screening     Status: None   Collection Time: 04/17/19  1:47 PM  Result Value Ref Range Status   MRSA by PCR NEGATIVE NEGATIVE Final    Comment:        The GeneXpert MRSA Assay (FDA approved for NASAL specimens only), is one component of a comprehensive MRSA colonization surveillance program. It is not intended to diagnose MRSA infection nor to guide or monitor treatment for MRSA infections. Performed at [facility], [address] [place], [place] [address]   Gastrointestinal Panel by PCR , Stool     Status: Abnormal   Collection Time: 04/17/19 10:42 PM  Result Value Ref Range Status    Campylobacter species NOT DETECTED NOT DETECTED Final   Plesimonas shigelloides NOT DETECTED NOT DETECTED Final   Salmonella species NOT DETECTED NOT DETECTED Final   Yersinia enterocolitica NOT DETECTED NOT DETECTED Final   Vibrio species NOT DETECTED NOT DETECTED Final   Vibrio cholerae NOT DETECTED NOT DETECTED Final   Enteroaggregative E coli (EAEC) NOT DETECTED NOT DETECTED Final   Enteropathogenic E coli (EPEC) DETECTED (A) NOT DETECTED Final    Comment: RESULT CALLED TO, READ BACK BY AND VERIFIED WITH: BETH BUONO AT 0211 04/19/2019 SDR    Enterotoxigenic E coli (ETEC) NOT DETECTED NOT DETECTED Final   Shiga like toxin producing E coli (STEC) NOT DETECTED NOT DETECTED Final   Shigella/Enteroinvasive E coli (EIEC) NOT DETECTED NOT DETECTED Final   Cryptosporidium NOT DETECTED NOT DETECTED Final   Cyclospora cayetanensis NOT DETECTED NOT DETECTED Final   Entamoeba histolytica NOT DETECTED NOT DETECTED Final   Giardia lamblia NOT DETECTED NOT DETECTED Final   Adenovirus F40/41 NOT DETECTED NOT DETECTED Final   Astrovirus NOT DETECTED NOT DETECTED Final   Norovirus GI/GII NOT DETECTED NOT DETECTED Final   Rotavirus A NOT DETECTED NOT DETECTED Final   Sapovirus (I, II, IV, and V) NOT DETECTED NOT DETECTED Final    Comment: Performed at [facility], [address] [place], [place] [address]  C difficile quick scan w PCR reflex     Status: None   Collection Time: 04/18/19 10:42 PM  Result Value Ref Range Status   C Diff antigen NEGATIVE NEGATIVE Final   C Diff toxin NEGATIVE NEGATIVE Final   C Diff interpretation No C. difficile detected.  Final    Comment: Performed at [facility], [address] [place], [place] [address]  Novel Coronavirus, NAA (hospital order; send-out to ref lab)     Status: None   Collection Time: 04/25/19  5:04 PM  Result Value Ref Range Status   SARS-CoV-2, NAA NOT DETECTED NOT DETECTED Final    Comment: (NOTE) This test was developed  and its performance characteristics determined by [employer]. This test has not been FDA cleared or approved. This test has  been authorized by FDA under an Emergency Use Authorization (EUA). This test is only authorized for the duration of time the declaration that circumstances exist justifying the authorization of the emergency use of in vitro diagnostic tests for detection of SARS-CoV-2 virus and/or diagnosis of COVID-19 infection under section 564(b)(1) of the Act, 21 U.S.C. 914NWG-9(F)(6), unless the authorization is terminated or revoked sooner. When diagnostic testing is negative, the possibility of a false negative result should be considered in the context of a patient's recent exposures and the presence of clinical signs and symptoms consistent with COVID-19. An individual without symptoms of COVID-19 and who is not shedding SARS-CoV-2 virus would expect to have a negative (not detected) result in this assay. Performed  At: [facility] [place], [place] [id number] Rush Farmer MD IO:[id number]    [place]  Final    Comment: Performed at [facility], [address] [place], [place] [address]    Coagulation Studies: No results for input(s): LABPROT, INR in the last 72 hours.  Urinalysis: No results for input(s): COLORURINE, LABSPEC, PHURINE, GLUCOSEU, HGBUR, BILIRUBINUR, KETONESUR, PROTEINUR, UROBILINOGEN, NITRITE, LEUKOCYTESUR in the last 72 hours.  Invalid input(s): APPERANCEUR    Imaging: Mr Virgel Paling UU Contrast  Result Date: 04/28/2019 CLINICAL DATA:  74 year old female admitted with gastrointestinal bleed found to be hypertensive, with paroxysmal atrial fibrillation, developed lethargy and RIGHT facial droop. EXAM: MRI HEAD WITHOUT CONTRAST MRA HEAD WITHOUT CONTRAST TECHNIQUE: Multiplanar, multiecho pulse sequences of the brain and surrounding structures were obtained without intravenous contrast.  Angiographic images of the head were obtained using MRA technique without contrast. COMPARISON:  CT head earlier today was normal. FINDINGS: MRI HEAD FINDINGS Brain: No evidence for acute infarction, hemorrhage, mass lesion, hydrocephalus, or extra-axial fluid. Mild atrophy, not necessarily unexpected for age. Mild subcortical and periventricular T2 and FLAIR hyperintensities, likely chronic microvascular ischemic change. Vascular: Reported separately. Skull and upper cervical spine: No acute findings. Sinuses/Orbits: BILATERAL cataract extraction. No layering fluid. Other: None MRA HEAD FINDINGS Internal carotid arteries demonstrate symmetric size and signal. No flow-limiting stenosis across the upper cervical, skull base, and supraclinoid segments. Basilar artery is widely patent with vertebrals codominant. No posterior circulation stenosis. There is a high-grade stenosis of the LEFT M1 middle cerebral artery in its distal aspect, thread-like lumen, with diminutive flow related enhancement in the more distal LEFT M2 and M3 branches. There is mild irregularity of the LEFT A1 ACA, non worrisome given in the dominant RIGHT A1 ACA and patent anterior communicating artery. RIGHT M1 MCA unremarkable. Both posterior cerebral arteries are patent and there is no visible cerebellar branch occlusion. No visible saccular aneurysm. IMPRESSION: No acute intracranial findings. Specifically no acute stroke. Mild atrophy and small vessel disease. 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 Neuro-Interventional Radiology consultation is suggested to evaluate the appropriateness of potential treatment. Non-emergent evaluation can be arranged by calling [phone] during usual hours. Emergency evaluation can be requested by paging [phone]. Electronically Signed   By: Staci Righter M.D.   On: 04/28/2019 18:43   Mr Brain Wo Contrast  Result Date: 04/28/2019 CLINICAL DATA:  72 year old  female admitted with gastrointestinal bleed found to be hypertensive, with paroxysmal atrial fibrillation, developed lethargy and RIGHT facial droop. EXAM: MRI HEAD WITHOUT CONTRAST MRA HEAD WITHOUT CONTRAST TECHNIQUE: Multiplanar, multiecho pulse sequences of the brain and surrounding structures were obtained without intravenous contrast. Angiographic images of the head were obtained using MRA technique without contrast. COMPARISON:  CT head earlier today was normal. FINDINGS: MRI HEAD FINDINGS Brain: No evidence for acute infarction, hemorrhage, mass lesion, hydrocephalus, or extra-axial fluid. Mild atrophy, not necessarily unexpected for age. Mild subcortical and periventricular T2 and FLAIR hyperintensities, likely chronic microvascular ischemic change. Vascular: Reported separately. Skull and upper cervical spine: No acute findings. Sinuses/Orbits: BILATERAL cataract extraction. No layering fluid. Other: None MRA HEAD FINDINGS Internal carotid arteries demonstrate symmetric size and signal. No flow-limiting stenosis across the upper cervical, skull base, and supraclinoid segments. Basilar artery is widely patent with vertebrals codominant. No posterior circulation stenosis. There is a high-grade stenosis of the LEFT M1 middle cerebral artery in its distal aspect, thread-like lumen, with diminutive flow related enhancement in the more distal LEFT M2 and M3 branches. There is mild irregularity of the LEFT A1 ACA, non worrisome given in the dominant RIGHT A1 ACA and patent anterior communicating artery. RIGHT M1 MCA unremarkable. Both posterior cerebral arteries are patent and there is no visible cerebellar branch occlusion. No visible saccular aneurysm. IMPRESSION: No acute intracranial findings. Specifically no acute stroke. Mild atrophy and small vessel disease. 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 Neuro-Interventional Radiology consultation is suggested to evaluate  the appropriateness of potential treatment. Non-emergent evaluation can be arranged by calling [phone] during usual hours. Emergency evaluation can be requested by paging [phone]. Electronically Signed   By: Elsie StainJohn T Curnes M.D.   On: 04/28/2019 18:43   [place] Carotid Bilateral  Result Date: 04/28/2019 CLINICAL DATA:  TIA symptoms, hypertension EXAM: BILATERAL CAROTID DUPLEX ULTRASOUND TECHNIQUE: Wallace CullensGray scale imaging, color Doppler and duplex ultrasound were performed of bilateral carotid and vertebral arteries in the neck. COMPARISON:  None. FINDINGS: Criteria: Quantification of carotid stenosis is based on velocity parameters that correlate the residual internal carotid diameter with NASCET-based stenosis levels, using the diameter of the distal internal carotid lumen as the denominator for stenosis measurement. The following velocity measurements were obtained: RIGHT ICA: 92/30 cm/sec CCA: 80/17 cm/sec SYSTOLIC ICA/CCA RATIO:  1.15 ECA: 78 cm/sec LEFT ICA: 93/24 cm/sec CCA: 79/17 cm/sec SYSTOLIC ICA/CCA RATIO:  1.17 ECA: 64 cm/sec RIGHT CAROTID ARTERY: Minor intimal thickening and atherosclerotic change. No hemodynamically significant right ICA stenosis, velocity elevation, or turbulent flow. Degree of narrowing less than 50%. RIGHT VERTEBRAL ARTERY:  Antegrade LEFT CAROTID ARTERY: Similar scattered minor intimal thickening and atherosclerotic change. No hemodynamically significant left ICA stenosis, velocity elevation, or turbulent flow. LEFT VERTEBRAL ARTERY:  Antegrade IMPRESSION: Minor carotid atherosclerosis. No hemodynamically significant ICA stenosis. Degree of narrowing less than 50% bilaterally by ultrasound criteria. Patent antegrade vertebral flow bilaterally Electronically Signed   By: Judie PetitM.  Shick M.D.   On: 04/28/2019 16:30   Ct Head Code Stroke Wo Contrast`  Result Date: 04/28/2019 CLINICAL DATA:  Code stroke. 73 year old female with right facial droop and altered mental status. EXAM: CT HEAD  WITHOUT CONTRAST TECHNIQUE: Contiguous axial images were obtained from the base of the skull through the vertex without intravenous contrast. COMPARISON:  None. FINDINGS: Brain: Cerebral volume is within normal limits for age. No midline shift, ventriculomegaly, mass effect, evidence of mass lesion, intracranial hemorrhage or evidence of cortically based acute infarction. Gray-white matter differentiation is within normal limits throughout the brain. No cortical encephalomalacia. Vascular: Calcified atherosclerosis at the skull base. No suspicious intracranial vascular hyperdensity. Skull: No acute osseous abnormality identified. Sinuses/Orbits: Chronic appearing left mastoid sclerosis. Visible paranasal sinuses and right mastoids are well pneumatized. Other: No acute orbit or scalp soft tissue findings. ASPECTS [facility]([place]  Stroke Program Early CT Score) - Ganglionic level infarction (caudate, lentiform nuclei, internal capsule, insula, M1-M3 cortex): 7 - Supraganglionic infarction (M4-M6 cortex): 3 Total score (0-10 with 10 being normal): 10 IMPRESSION: 1. Normal for age non contrast CT appearance of the brain. ASPECTS is 10. 2. Study discussed by telephone with Dr. Thad Rangereynolds with Neurology on 04/28/2019 at 13:20 . Electronically Signed   By: Odessa FlemingH  Hall M.D.   On: 04/28/2019 13:20     Medications:   . sodium chloride Stopped (04/28/19 1644)  . sodium chloride     . aspirin  81 mg Oral Daily  . epoetin (EPOGEN/PROCRIT) injection  10,000 Units Subcutaneous Weekly  . feeding supplement (NEPRO CARB STEADY)  237 mL Oral BID BM  . metoprolol tartrate  12.5 mg Oral BID  . multivitamin  1 tablet Oral QHS  . pantoprazole  40 mg Oral BID AC  . potassium chloride  40 mEq Oral Once  . sodium chloride flush  10-40 mL Intracatheter Q12H  . ursodiol  300 mg Oral BID  . venlafaxine XR  37.5 mg Oral Q breakfast   sodium chloride, meclizine, metoprolol tartrate, metoprolol tartrate, ondansetron **OR** ondansetron (ZOFRAN)  IV, senna-docusate, sodium chloride flush, sodium chloride flush  Assessment/ Plan:  Ms. Sarah Sullivan is a 73 y.o. Asian female with a PMHx of hypertension, degenerative disc disease, lumbar radiculitis, history of depression, osteopenia, who was admitted [facility] 04/14/2019 for hypovolemic shock with E. Coli colitis.   1.  Acute renal failure with metabolic acidosis: secondary to prerenal azotemia, GI losses and blood losses. ATN. Nonoliguric urine output. Hematuria and proteinuria on admission.  Creatinine baseline of 1, GFR of 56 on 03/27/19. Consistent with chronic kidney disease stage III. Renal imaging- CT abdomen Abd/pelvis- 5/23 was unremarkable except for colitis -No further need for hemodialysis is anticipated -May remove dialysis catheter when there is no need for central line - bicarb improved. 06/05 0701 - 06/06 0700 In: 538.7 [P.O.:480; I.V.:8.7; IV Piggyback:50] Out: -    2. LE edema - 3rd spacing and renal failure, significantly improved - monitor UOP   - may hold diuretic in setting of tachycardia and hemodynamic instability  3. Anemia with renal failure: status post 1 unit PRBC 5/26. With thrombocytopenia Lab Results  Component Value Date   HGB 8.5 (L) 04/29/2019   - EPO SQ  4. Colitis:Enteropathogenic E coli (EPEC) - ciprofloxacin.  - colonoscopy unremarkable  5.  Atrial fibrillation with RVR -Rate control with metoprolol   LOS: 13 Sarah Sullivan 6/6/202012:51 PM

## 2019-04-29 NOTE — Plan of Care (Signed)

## 2019-04-29 NOTE — Progress Notes (Signed)
Subjective: Patient resting this morning but easily awakened.  Reports no new neurological complaints.    Objective: Current vital signs: BP (!) 164/72 (BP Location: Left Arm)   Pulse 73   Temp 98.6 F (37 C)   Resp 16   Ht  (1.499 m)   Wt 57.4 kg   SpO2 94%   BMI 25.56 kg/m  Vital signs in last 24 hours: Temp:  [98.2 F (36.8 C)-98.7 F (37.1 C)] 98.6 F (37 C) (06/06 0948) Pulse Rate:  [49-75] 73 (06/06 0948) Resp:  [9-16] 16 (06/06 0428) BP: (101-187)/(53-74) 164/72 (06/06 0948) SpO2:  [94 %-98 %] 94 % (06/06 0948)  Intake/Output from previous day: 06/05 0701 - 06/06 0700 In: 538.7 [P.O.:480; I.V.:8.7; IV Piggyback:50] Out: -  Intake/Output this shift: No intake/output data recorded. Nutritional status:  Diet Order            Diet regular Room service appropriate? Yes; Fluid consistency: Thin  Diet effective now              Neurologic Exam: Mental Status: Alert, oriented, thought content appropriate.  Speech fluent without evidence of aphasia.  Able to follow 3 step commands without difficulty. Cranial Nerves: II: Discs flat bilaterally; Visual fields grossly normal, pupils equal, round, reactive to light and accommodation III,IV, VI: ptosis not present, extra-ocular motions intact bilaterally V,VII: smile symmetric, facial light touch sensation normal bilaterally VIII: hearing normal bilaterally IX,X: gag reflex present XI: bilateral shoulder shrug XII: midline tongue extension Motor: 5/5 throughout   Lab Results: Basic Metabolic Panel: Recent Labs  Lab 04/23/19 0414  04/24/19 0453 04/25/19 0359 04/26/19 0543 04/27/19 0414 04/28/19 0405 04/29/19 0712  NA 143  --  144 144 145 146* 146* 144  K 2.8*   < > 3.3* 3.8 3.3* 3.5 3.0* 2.7*  CL 102  --  105 107 108 110 109 103  CO2 28  --  GLUCOSE 109*  --  88 143* 130* 123* 100* 98  BUN 53*  --  52* 58* 55* 51* 50* 55*  CREATININE 4.10*  --  4.49* 4.25* 3.80* 3.22* 2.97*  2.75*  CALCIUM 7.8*  --  8.0* 8.1* 7.9* 8.2* 8.2* 8.5*  MG 1.8  --  1.8 1.8  --   --  1.6*  --    < > = values in this interval not displayed.    Liver Function Tests: Recent Labs  Lab 04/28/19 0405 04/29/19 0712  AST 40 52*  ALT 58* 59*  ALKPHOS 307* 322*  BILITOT 0.8 0.8  PROT 6.0* 6.8  ALBUMIN 2.9* 3.3*   No results for input(s): LIPASE, AMYLASE in the last 168 hours. No results for input(s): AMMONIA in the last 168 hours.  CBC: Recent Labs  Lab 04/24/19 1913 04/25/19 0359  04/25/19 2254 04/26/19 0543 04/27/19 0411 04/28/19 0405 04/29/19 0712  WBC 7.4 7.6  --   --  8.5  --  8.6 9.3  HGB 8.2* 7.3*   < > 7.4* 6.9* 8.0* 8.4* 8.5*  HCT 24.7* 22.3*   < > 22.5* 21.6* 24.4* 25.8* 26.4*  MCV 92.5 93.3  --   --  95.2  --  94.2 93.6  PLT 126* 119*  --   --  124*  --  120* 122*   < > = values in this interval not displayed.    Cardiac Enzymes: No results for input(s): CKTOTAL, CKMB, CKMBINDEX, TROPONINI in the last 168 hours.  Lipid Panel: Recent Labs  Lab 04/29/19 0406  CHOL 175  TRIG 93  HDL 68  CHOLHDL 2.6  VLDL 19  LDLCALC 88    CBG: Recent Labs  Lab 04/28/19 1241  GLUCAP 112*    Microbiology: Results for orders placed or performed during the hospital encounter of 04/14/19  SARS Coronavirus 2 (CEPHEID - Performed in [facility] hospital lab), Hosp Order     Status: None   Collection Time: 04/14/19 10:32 AM  Result Value Ref Range Status   SARS Coronavirus 2 NEGATIVE NEGATIVE Final    Comment: (NOTE) If result is NEGATIVE SARS-CoV-2 target nucleic acids are NOT DETECTED. The SARS-CoV-2 RNA is generally detectable in upper and lower  respiratory specimens during the acute phase of infection. The lowest  concentration of SARS-CoV-2 viral copies this assay can detect is 250  copies / mL. A negative result does not preclude SARS-CoV-2 infection  and should not be used as the sole basis for treatment or other  patient management decisions.  A negative  result may occur with  improper specimen collection / handling, submission of specimen other  than nasopharyngeal swab, presence of viral mutation(s) within the  areas targeted by this assay, and inadequate number of viral copies  (<250 copies / mL). A negative result must be combined with clinical  observations, patient history, and epidemiological information. If result is POSITIVE SARS-CoV-2 target nucleic acids are DETECTED. The SARS-CoV-2 RNA is generally detectable in upper and lower  respiratory specimens dur ing the acute phase of infection.  Positive  results are indicative of active infection with SARS-CoV-2.  Clinical  correlation with patient history and other diagnostic information is  necessary to determine patient infection status.  Positive results do  not rule out bacterial infection or co-infection with other viruses. If result is PRESUMPTIVE POSTIVE SARS-CoV-2 nucleic acids MAY BE PRESENT.   A presumptive positive result was obtained on the submitted specimen  and confirmed on repeat testing.  While 2019 novel coronavirus  (SARS-CoV-2) nucleic acids may be present in the submitted sample  additional confirmatory testing may be necessary for epidemiological  and / or clinical management purposes  to differentiate between  SARS-CoV-2 and other Sarbecovirus currently known to infect humans.  If clinically indicated additional testing with an alternate test  methodology [phone]) is advised. The SARS-CoV-2 RNA is generally  detectable in upper and lower respiratory sp ecimens during the acute  phase of infection. The expected result is Negative. Fact Sheet for Patients:  StrictlyIdeas.no Fact Sheet for Healthcare Providers: BankingDealers.co.za This test is not yet approved or cleared by the [place] FDA and has been authorized for detection and/or diagnosis of SARS-CoV-2 by FDA under an Emergency Use Authorization  (EUA).  This EUA will remain in effect (meaning this test can be used) for the duration of the COVID-19 declaration under Section 564(b)(1) of the Act, 21 U.S.C. section 360bbb-3(b)(1), unless the authorization is terminated or revoked sooner. Performed at [facility], [address] [place], [place] [address]   MRSA PCR Screening     Status: None   Collection Time: 04/14/19  7:22 PM  Result Value Ref Range Status   MRSA by PCR NEGATIVE NEGATIVE Final    Comment:        The GeneXpert MRSA Assay (FDA approved for NASAL specimens only), is one component of a comprehensive MRSA colonization surveillance program. It is not intended to diagnose MRSA infection nor to guide or monitor treatment for MRSA  infections. Performed at [facility], [address] [place], [place] [address]   CULTURE, BLOOD (ROUTINE X 2) w Reflex to ID Panel     Status: None   Collection Time: 04/16/19  7:47 AM  Result Value Ref Range Status   Specimen Description BLOOD BLOOD RIGHT HAND  Final   Special Requests   Final    BOTTLES DRAWN AEROBIC AND ANAEROBIC Blood Culture adequate volume   Culture   Final    NO GROWTH 5 DAYS Performed at [facility], [address] [place], [place] [address]    Report Status 04/21/2019 FINAL  Final  CULTURE, BLOOD (ROUTINE X 2) w Reflex to ID Panel     Status: None   Collection Time: 04/16/19  9:27 AM  Result Value Ref Range Status   Specimen Description BLOOD BLOOD RIGHT HAND  Final   Special Requests   Final    BOTTLES DRAWN AEROBIC ONLY Blood Culture adequate volume   Culture   Final    NO GROWTH 5 DAYS Performed at [facility], [address] [place], [place] [address]    Report Status 04/21/2019 FINAL  Final  MRSA PCR Screening     Status: None   Collection Time: 04/17/19  1:47 PM  Result Value Ref Range Status   MRSA by PCR NEGATIVE NEGATIVE Final    Comment:        The GeneXpert MRSA Assay (FDA approved for  NASAL specimens only), is one component of a comprehensive MRSA colonization surveillance program. It is not intended to diagnose MRSA infection nor to guide or monitor treatment for MRSA infections. Performed at [facility], [address] [place], [place] [address]   Gastrointestinal Panel by PCR , Stool     Status: Abnormal   Collection Time: 04/17/19 10:42 PM  Result Value Ref Range Status   Campylobacter species NOT DETECTED NOT DETECTED Final   Plesimonas shigelloides NOT DETECTED NOT DETECTED Final   Salmonella species NOT DETECTED NOT DETECTED Final   Yersinia enterocolitica NOT DETECTED NOT DETECTED Final   Vibrio species NOT DETECTED NOT DETECTED Final   Vibrio cholerae NOT DETECTED NOT DETECTED Final   Enteroaggregative E coli (EAEC) NOT DETECTED NOT DETECTED Final   Enteropathogenic E coli (EPEC) DETECTED (A) NOT DETECTED Final    Comment: RESULT CALLED TO, READ BACK BY AND VERIFIED WITH: BETH BUONO AT 0211 04/19/2019 SDR    Enterotoxigenic E coli (ETEC) NOT DETECTED NOT DETECTED Final   Shiga like toxin producing E coli (STEC) NOT DETECTED NOT DETECTED Final   Shigella/Enteroinvasive E coli (EIEC) NOT DETECTED NOT DETECTED Final   Cryptosporidium NOT DETECTED NOT DETECTED Final   Cyclospora cayetanensis NOT DETECTED NOT DETECTED Final   Entamoeba histolytica NOT DETECTED NOT DETECTED Final   Giardia lamblia NOT DETECTED NOT DETECTED Final   Adenovirus F40/41 NOT DETECTED NOT DETECTED Final   Astrovirus NOT DETECTED NOT DETECTED Final   Norovirus GI/GII NOT DETECTED NOT DETECTED Final   Rotavirus A NOT DETECTED NOT DETECTED Final   Sapovirus (I, II, IV, and V) NOT DETECTED NOT DETECTED Final    Comment: Performed at [facility], [address] [place], [place] [address]  C difficile quick scan w PCR reflex     Status: None   Collection Time: 04/18/19 10:42 PM  Result Value Ref Range Status   C Diff antigen NEGATIVE NEGATIVE Final   C Diff  toxin NEGATIVE NEGATIVE Final   C Diff  interpretation No C. difficile detected.  Final    Comment: Performed at [facility], [address] [place], [place] [address]  Novel Coronavirus, NAA (hospital order; send-out to ref lab)     Status: None   Collection Time: 04/25/19  5:04 PM  Result Value Ref Range Status   SARS-CoV-2, NAA NOT DETECTED NOT DETECTED Final    Comment: (NOTE) This test was developed and its performance characteristics determined by [facility]. This test has not been FDA cleared or approved. This test has been authorized by FDA under an Emergency Use Authorization (EUA). This test is only authorized for the duration of time the declaration that circumstances exist justifying the authorization of the emergency use of in vitro diagnostic tests for detection of SARS-CoV-2 virus and/or diagnosis of COVID-19 infection under section 564(b)(1) of the Act, 21 U.S.C. 604VWU-9(W)(1360bbb-3(b)(1), unless the authorization is terminated or revoked sooner. When diagnostic testing is negative, the possibility of a false negative result should be considered in the context of a patient's recent exposures and the presence of clinical signs and symptoms consistent with COVID-19. An individual without symptoms of COVID-19 and who is not shedding SARS-CoV-2 virus would expect to have a negative (not detected) result in this assay. Performed  At: [facility] [address] [place], [place] [id number] Jolene SchimkeNagendra Sanjai MD AO:[phone]    Coronavirus Source NASOPHARYNGEAL  Final    Comment: Performed at [facility], [address] [place], [place] [address]    Coagulation Studies: No results for input(s): LABPROT, INR in the last 72 hours.  Imaging: Mr Shirlee LatchMra Head MWWo Contrast  Result Date: 04/28/2019 CLINICAL DATA:  73 year old female admitted with gastrointestinal bleed found to be hypertensive, with paroxysmal atrial fibrillation, developed lethargy and  RIGHT facial droop. EXAM: MRI HEAD WITHOUT CONTRAST MRA HEAD WITHOUT CONTRAST TECHNIQUE: Multiplanar, multiecho pulse sequences of the brain and surrounding structures were obtained without intravenous contrast. Angiographic images of the head were obtained using MRA technique without contrast. COMPARISON:  CT head earlier today was normal. FINDINGS: MRI HEAD FINDINGS Brain: No evidence for acute infarction, hemorrhage, mass lesion, hydrocephalus, or extra-axial fluid. Mild atrophy, not necessarily unexpected for age. Mild subcortical and periventricular T2 and FLAIR hyperintensities, likely chronic microvascular ischemic change. Vascular: Reported separately. Skull and upper cervical spine: No acute findings. Sinuses/Orbits: BILATERAL cataract extraction. No layering fluid. Other: None MRA HEAD FINDINGS Internal carotid arteries demonstrate symmetric size and signal. No flow-limiting stenosis across the upper cervical, skull base, and supraclinoid segments. Basilar artery is widely patent with vertebrals codominant. No posterior circulation stenosis. There is a high-grade stenosis of the LEFT M1 middle cerebral artery in its distal aspect, thread-like lumen, with diminutive flow related enhancement in the more distal LEFT M2 and M3 branches. There is mild irregularity of the LEFT A1 ACA, non worrisome given in the dominant RIGHT A1 ACA and patent anterior communicating artery. RIGHT M1 MCA unremarkable. Both posterior cerebral arteries are patent and there is no visible cerebellar branch occlusion. No visible saccular aneurysm. IMPRESSION: No acute intracranial findings. Specifically no acute stroke. Mild atrophy and small vessel disease. 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 Neuro-Interventional Radiology consultation is suggested to evaluate the appropriateness of potential treatment. Non-emergent evaluation can be arranged by calling [phone] during usual hours.  Emergency evaluation can be requested by paging [phone]. Electronically Signed   By: Elsie StainJohn T Curnes M.D.   On: 04/28/2019 18:43   Mr Brain Wo Contrast  Result  Date: 04/28/2019 CLINICAL DATA:  73 year old female admitted with gastrointestinal bleed found to be hypertensive, with paroxysmal atrial fibrillation, developed lethargy and RIGHT facial droop. EXAM: MRI HEAD WITHOUT CONTRAST MRA HEAD WITHOUT CONTRAST TECHNIQUE: Multiplanar, multiecho pulse sequences of the brain and surrounding structures were obtained without intravenous contrast. Angiographic images of the head were obtained using MRA technique without contrast. COMPARISON:  CT head earlier today was normal. FINDINGS: MRI HEAD FINDINGS Brain: No evidence for acute infarction, hemorrhage, mass lesion, hydrocephalus, or extra-axial fluid. Mild atrophy, not necessarily unexpected for age. Mild subcortical and periventricular T2 and FLAIR hyperintensities, likely chronic microvascular ischemic change. Vascular: Reported separately. Skull and upper cervical spine: No acute findings. Sinuses/Orbits: BILATERAL cataract extraction. No layering fluid. Other: None MRA HEAD FINDINGS Internal carotid arteries demonstrate symmetric size and signal. No flow-limiting stenosis across the upper cervical, skull base, and supraclinoid segments. Basilar artery is widely patent with vertebrals codominant. No posterior circulation stenosis. There is a high-grade stenosis of the LEFT M1 middle cerebral artery in its distal aspect, thread-like lumen, with diminutive flow related enhancement in the more distal LEFT M2 and M3 branches. There is mild irregularity of the LEFT A1 ACA, non worrisome given in the dominant RIGHT A1 ACA and patent anterior communicating artery. RIGHT M1 MCA unremarkable. Both posterior cerebral arteries are patent and there is no visible cerebellar branch occlusion. No visible saccular aneurysm. IMPRESSION: No acute intracranial findings.  Specifically no acute stroke. Mild atrophy and small vessel disease. 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 Neuro-Interventional Radiology consultation is suggested to evaluate the appropriateness of potential treatment. Non-emergent evaluation can be arranged by calling [phone] during usual hours. Emergency evaluation can be requested by paging [phone]. Electronically Signed   By: Elsie StainJohn T Curnes M.D.   On: 04/28/2019 18:43   [place] Carotid Bilateral  Result Date: 04/28/2019 CLINICAL DATA:  TIA symptoms, hypertension EXAM: BILATERAL CAROTID DUPLEX ULTRASOUND TECHNIQUE: Wallace CullensGray scale imaging, color Doppler and duplex ultrasound were performed of bilateral carotid and vertebral arteries in the neck. COMPARISON:  None. FINDINGS: Criteria: Quantification of carotid stenosis is based on velocity parameters that correlate the residual internal carotid diameter with NASCET-based stenosis levels, using the diameter of the distal internal carotid lumen as the denominator for stenosis measurement. The following velocity measurements were obtained: RIGHT ICA: 92/30 cm/sec CCA: 80/17 cm/sec SYSTOLIC ICA/CCA RATIO:  1.15 ECA: 78 cm/sec LEFT ICA: 93/24 cm/sec CCA: 79/17 cm/sec SYSTOLIC ICA/CCA RATIO:  1.17 ECA: 64 cm/sec RIGHT CAROTID ARTERY: Minor intimal thickening and atherosclerotic change. No hemodynamically significant right ICA stenosis, velocity elevation, or turbulent flow. Degree of narrowing less than 50%. RIGHT VERTEBRAL ARTERY:  Antegrade LEFT CAROTID ARTERY: Similar scattered minor intimal thickening and atherosclerotic change. No hemodynamically significant left ICA stenosis, velocity elevation, or turbulent flow. LEFT VERTEBRAL ARTERY:  Antegrade IMPRESSION: Minor carotid atherosclerosis. No hemodynamically significant ICA stenosis. Degree of narrowing less than 50% bilaterally by ultrasound criteria. Patent antegrade vertebral flow bilaterally Electronically Signed    By: Judie PetitM.  Shick M.D.   On: 04/28/2019 16:30   Ct Head Code Stroke Wo Contrast`  Result Date: 04/28/2019 CLINICAL DATA:  Code stroke. 73 year old female with right facial droop and altered mental status. EXAM: CT HEAD WITHOUT CONTRAST TECHNIQUE: Contiguous axial images were obtained from the base of the skull through the vertex without intravenous contrast. COMPARISON:  None. FINDINGS: Brain: Cerebral volume is within normal limits for age. No midline shift, ventriculomegaly, mass effect, evidence of mass lesion, intracranial hemorrhage  or evidence of cortically based acute infarction. Gray-white matter differentiation is within normal limits throughout the brain. No cortical encephalomalacia. Vascular: Calcified atherosclerosis at the skull base. No suspicious intracranial vascular hyperdensity. Skull: No acute osseous abnormality identified. Sinuses/Orbits: Chronic appearing left mastoid sclerosis. Visible paranasal sinuses and right mastoids are well pneumatized. Other: No acute orbit or scalp soft tissue findings. ASPECTS [facility]([place] Stroke Program Early CT Score) - Ganglionic level infarction (caudate, lentiform nuclei, internal capsule, insula, M1-M3 cortex): 7 - Supraganglionic infarction (M4-M6 cortex): 3 Total score (0-10 with 10 being normal): 10 IMPRESSION: 1. Normal for age non contrast CT appearance of the brain. ASPECTS is 10. 2. Study discussed by telephone with Dr. Thad Rangereynolds with Neurology on 04/28/2019 at 13:20 . Electronically Signed   By: Odessa FlemingH  Hall M.D.   On: 04/28/2019 13:20    Medications:  I have reviewed the patient's current medications. Scheduled: . aspirin  81 mg Oral Daily  . epoetin (EPOGEN/PROCRIT) injection  10,000 Units Subcutaneous Weekly  . feeding supplement (NEPRO CARB STEADY)  237 mL Oral BID BM  . metoprolol tartrate  12.5 mg Oral BID  . multivitamin  1 tablet Oral QHS  . pantoprazole  40 mg Oral BID AC  . potassium chloride  40 mEq Oral Once  . sodium chloride flush  10-40  mL Intracatheter Q12H  . ursodiol  300 mg Oral BID  . venlafaxine XR  37.5 mg Oral Q breakfast    Assessment/Plan: Patient reports no new neurological complaints.  MRI of the brain reviewed and shows no acute changes.  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  Patient also with new onset afib.  Patient on ASA.  Carotid dopplers show no evidence of hemodynamically significant stenosis.  LDL 88.  Recommendations: 1. Prophylactic therapy to be led by IM based on patient's multiple medical problems that may preclude maximum medical therapy    LOS: 13 days   Thana FarrLeslie Zurie Platas, MD Neurology [phone] 04/29/2019  10:22 AM

## 2019-04-29 NOTE — Progress Notes (Signed)
Noted small amount of blood in patient stool. Stool not fully formed with jagged edges.  Stacie Glaze, RN

## 2019-04-29 NOTE — Progress Notes (Signed)
Patient blood pressure 187/74 with pulse of 64. Lopressor ordered for high blood pressure but did not give due to heart rate. NP Seals notified. 10 mg hydralazine ordered and given. Stacie Glaze, RN

## 2019-04-29 NOTE — Progress Notes (Signed)
Patient ID: Sarah Sullivan, female   DOB: 17-Dec-1945, 73 y.o.   MRN: [id number]  [facility] PROGRESS NOTE  Sarah Sullivan WUJ:[id number] DOB: 17-Dec-1945 DOA: 04/14/2019 PCP: Titus MouldWhite, Elizabeth Burney, NP  HPI/Subjective:  Patient states that she is feeling much better today.  Denies any chest pain, palpitations, shortness of breath.  She states she is feeling well enough to go home.  Objective: Vitals:   04/29/19 0428 04/29/19 0948  BP: (!) 187/74 (!) 164/72  Pulse: 64 73  Resp: 16   Temp: 98.2 F (36.8 C) 98.6 F (37 C)  SpO2: 96% 94%    Filed Weights   04/21/19 1532 04/26/19 1234  Weight: 57.4 kg 57.4 kg    ROS: Review of Systems  Constitutional: Negative for chills and fever.  Eyes: Negative for blurred vision.  Respiratory: Negative for cough and shortness of breath.   Cardiovascular: Negative for chest pain.  Gastrointestinal: Negative for abdominal pain, constipation, diarrhea, nausea and vomiting.  Genitourinary: Negative for dysuria.  Musculoskeletal: Negative for joint pain.  Neurological: Negative for dizziness and headaches.   Exam: GENERAL:  Laying in the bed with no acute distress.  HEENT: Head atraumatic, normocephalic. Pupils equal, round, reactive to light and accommodation. No scleral icterus. Extraocular muscles intact. Oropharynx and nasopharynx clear.  NECK:  Supple, no jugular venous distention. No thyroid enlargement. LUNGS: Lungs are clear to auscultation bilaterally. No wheezes, crackles, rhonchi. No use of accessory muscles of respiration.  CARDIOVASCULAR: RRR, S1, S2 normal. No murmurs, rubs, or gallops.  ABDOMEN: Soft, nontender, nondistended. Bowel sounds present.  EXTREMITIES: No pedal edema, cyanosis, or clubbing.  NEUROLOGIC: CN 2-12 intact, no focal deficits. 5/5 muscle strength throughout all extremities. Sensation intact throughout. Gait not checked. Smile is symmetric, no facial droop noted.  PSYCHIATRIC: The patient is alert and  oriented x 3.  SKIN: No obvious rash, lesion, or ulcer.   Data Reviewed: Basic Metabolic Panel: Recent Labs  Lab 04/23/19 0414  04/24/19 0453 04/25/19 0359 04/26/19 0543 04/27/19 0414 04/28/19 0405 04/29/19 0712  NA 143  --  144 144 145 146* 146* 144  K 2.8*   < > 3.3* 3.8 3.3* 3.5 3.0* 2.7*  CL 102  --  105 107 108 110 109 103  CO2 28  --  29 27 26 25 26 29   GLUCOSE 109*  --  88 143* 130* 123* 100* 98  BUN 53*  --  52* 58* 55* 51* 50* 55*  CREATININE 4.10*  --  4.49* 4.25* 3.80* 3.22* 2.97* 2.75*  CALCIUM 7.8*  --  8.0* 8.1* 7.9* 8.2* 8.2* 8.5*  MG 1.8  --  1.8 1.8  --   --  1.6*  --    < > = values in this interval not displayed.   Liver Function Tests: Recent Labs  Lab 04/28/19 0405 04/29/19 0712  AST 40 52*  ALT 58* 59*  ALKPHOS 307* 322*  BILITOT 0.8 0.8  PROT 6.0* 6.8  ALBUMIN 2.9* 3.3*   No results for input(s): LIPASE, AMYLASE in the last 168 hours. CBC: Recent Labs  Lab 04/24/19 1913 04/25/19 0359  04/25/19 2254 04/26/19 0543 04/27/19 0411 04/28/19 0405 04/29/19 0712  WBC 7.4 7.6  --   --  8.5  --  8.6 9.3  HGB 8.2* 7.3*   < > 7.4* 6.9* 8.0* 8.4* 8.5*  HCT 24.7* 22.3*   < > 22.5* 21.6* 24.4* 25.8* 26.4*  MCV 92.5 93.3  --   --  95.2  --  94.2 93.6  PLT 126* 119*  --   --  124*  --  120* 122*   < > = values in this interval not displayed.   Cardiac Enzymes: No results for input(s): CKTOTAL, CKMB, CKMBINDEX, TROPONINI in the last 168 hours.   Recent Results (from the past 240 hour(s))  Novel Coronavirus, NAA (hospital order; send-out to ref lab)     Status: None   Collection Time: 04/25/19  5:04 PM  Result Value Ref Range Status   SARS-CoV-2, NAA NOT DETECTED NOT DETECTED Final    Comment: (NOTE) This test was developed and its performance characteristics determined by [facility]. This test has not been FDA cleared or approved. This test has been authorized by FDA under an Emergency Use Authorization (EUA). This test is only  authorized for the duration of time the declaration that circumstances exist justifying the authorization of the emergency use of in vitro diagnostic tests for detection of SARS-CoV-2 virus and/or diagnosis of COVID-19 infection under section 564(b)(1) of the Act, 21 U.S.C. 409WJX-9(J)(4360bbb-3(b)(1), unless the authorization is terminated or revoked sooner. When diagnostic testing is negative, the possibility of a false negative result should be considered in the context of a patient's recent exposures and the presence of clinical signs and symptoms consistent with COVID-19. An individual without symptoms of COVID-19 and who is not shedding SARS-CoV-2 virus would expect to have a negative (not detected) result in this assay. Performed  At: [facility] [place] [address] [place], [place] [id number] Jolene SchimkeNagendra Sanjai MD YQ:[id number]:[phone]    Coronavirus Source NASOPHARYNGEAL  Final    Comment: Performed at [facility], [address] [place], [place] [address]      Scheduled Meds: . aspirin  81 mg Oral Daily  . epoetin (EPOGEN/PROCRIT) injection  10,000 Units Subcutaneous Weekly  . feeding supplement (NEPRO CARB STEADY)  237 mL Oral BID BM  . metoprolol tartrate  12.5 mg Oral BID  . multivitamin  1 tablet Oral QHS  . pantoprazole  40 mg Oral BID AC  . potassium chloride  40 mEq Oral Once  . sodium chloride flush  10-40 mL Intracatheter Q12H  . ursodiol  300 mg Oral BID  . venlafaxine XR  37.5 mg Oral Q breakfast   Continuous Infusions: . sodium chloride Stopped (04/28/19 1644)  . diltiazem (CARDIZEM) infusion Stopped (04/28/19 1322)  . sodium chloride      Assessment/Plan:  Atrial fibrillation with rapid ventricular response- resolved. Now in NSR, rate-controlled.  [facility] cardiology following -Continue metoprolol -Hold on anticoagulation due to GI bleed, needs to be addressed as an outpatient  Acute right facial droop- resolved. 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 Hgb stable. -Continue protonix 40mg  bid long-term -Avoid NSAIDs -Needs to f/u with GI as an outpatient in 2-3 weeks  Acute kidney injury- likely secondary to hypovolemic shock and GI losses. Creatinine continues to improve.   -[facility] -No further HD needed   Acute blood loss anemia- 1 u RBC given during the hospital course. Hemoglobin stable. -Monitor   Enteropathogenic E. coli infection with colitis and hematochezia- has been treated with a course of ciprofloxacin.  Hypokalemia/hypomagnesemia -Replete and recheck  Thrombocytopenia- secondary to severe sepsis. -Monitor -Holding anticoagulation  Elevated LFTs- work-up  showed significantly elevated AMA levels -Ursodiol 300mg  bid per GI recommendations -F/u with GI as an outpatient  Code Status:     Code Status Orders  (From admission, onward)         Start     Ordered   04/14/19 1533  Full code  Continuous     04/14/19 1532        Code Status History    This patient has a current code status but no historical code status.     Family Communication: Called and updated patient's husband he is aware of the patient's critical situation disposition Plan: To be determined  Consultants:  [place]  Gastroenterology  Nephrology  Oncology  Neurology Time critical care time spent: 45 minutes.   Berna Spare [facility]  [employer]

## 2019-04-30 LAB — COMPREHENSIVE METABOLIC PANEL
ALT: 54 U/L — ABNORMAL HIGH (ref 0–44)
AST: 43 U/L — ABNORMAL HIGH (ref 15–41)
Albumin: 3.2 g/dL — ABNORMAL LOW (ref 3.5–5.0)
Alkaline Phosphatase: 299 U/L — ABNORMAL HIGH (ref 38–126)
Anion gap: 8 (ref 5–15)
BUN: 45 mg/dL — ABNORMAL HIGH (ref 8–23)
CO2: 30 mmol/L (ref 22–32)
Calcium: 8.5 mg/dL — ABNORMAL LOW (ref 8.9–10.3)
Chloride: 108 mmol/L (ref 98–111)
Creatinine, Ser: 2 mg/dL — ABNORMAL HIGH (ref 0.44–1.00)
GFR calc Af Amer: 28 mL/min — ABNORMAL LOW (ref >60)
GFR calc non Af Amer: 24 mL/min — ABNORMAL LOW (ref >60)
Glucose, Bld: 87 mg/dL (ref 70–99)
Potassium: 3.6 mmol/L (ref 3.5–5.1)
Sodium: 146 mmol/L — ABNORMAL HIGH (ref 135–145)
Total Bilirubin: 0.8 mg/dL (ref 0.3–1.2)
Total Protein: 6.7 g/dL (ref 6.5–8.1)

## 2019-04-30 LAB — CBC
HCT: 25.7 % — ABNORMAL LOW (ref 36.0–46.0)
Hemoglobin: 8.2 g/dL — ABNORMAL LOW (ref 12.0–15.0)
MCH: 30.1 pg (ref 26.0–34.0)
MCHC: 31.9 g/dL (ref 30.0–36.0)
MCV: 94.5 fL (ref 80.0–100.0)
Platelets: 119 10*3/uL — ABNORMAL LOW (ref 150–400)
RBC: 2.72 MIL/uL — ABNORMAL LOW (ref 3.87–5.11)
RDW: 16.4 % — ABNORMAL HIGH (ref 11.5–15.5)
WBC: 8.1 10*3/uL (ref 4.0–10.5)
nRBC: 0 % (ref 0.0–0.2)

## 2019-04-30 LAB — MAGNESIUM: Magnesium: 1.9 mg/dL (ref 1.7–2.4)

## 2019-04-30 MED ORDER — AMLODIPINE BESYLATE 5 MG PO TABS
5.0000 mg | ORAL_TABLET | Freq: Every day | ORAL | Status: DC
  Administered 2019-04-30: 5 mg via ORAL
  Filled 2019-04-30: qty 1

## 2019-04-30 MED ORDER — METOPROLOL TARTRATE 25 MG PO TABS: 12.5000 mg | ORAL_TABLET | Freq: Two times a day (BID) | ORAL | 0 refills | Status: DC

## 2019-04-30 MED ORDER — URSODIOL 300 MG PO CAPS: 300.0000 mg | ORAL_CAPSULE | Freq: Two times a day (BID) | ORAL | 0 refills | Status: DC

## 2019-04-30 MED ORDER — PANTOPRAZOLE SODIUM 40 MG PO TBEC: 40.0000 mg | DELAYED_RELEASE_TABLET | Freq: Two times a day (BID) | ORAL | 0 refills | Status: DC

## 2019-04-30 NOTE — TOC Transition Note (Signed)
Transition of Care [facility]) - CM/SW Discharge Note   Patient Details  Name: Sarah Sullivan MRN: [id number] Date of Birth: [birth year]  Transition of [facility]) CM/SW Contact:  Latanya Maudlin, RN Phone Number: 04/30/2019, 10:43 AM   Clinical Narrative:   Patient to be discharged per MD order. Orders in place for home health services. [facility], this remains the plan. Notified Melissa of pending discharge. Patient had rolling walker delivered to the room. Family to transport. No further needs.     Final next level of care: Home w Home Health Services Barriers to Discharge: No Barriers Identified   Patient Goals and CMS Choice   CMS Medicare.gov Compare Post Acute Care list provided to:: Patient Choice offered to / list presented to : Patient  Discharge Placement                       Discharge Plan and Services   Discharge Planning Services: CM Consult Post Acute Care Choice: Durable Medical Equipment, Home Health          DME Arranged: Walker rolling DME Agency: [employer] Date DME Agency Contacted: 04/20/19 Time DME Agency Contacted: 1127 Representative spoke with at DME Agency: [facility] HH Arranged: PT [employer]: [place] ([place]) Date [place]: 04/30/19 Time [place]: [phone] Representative spoke with at [place]: [place] ([place]) Interventions     Readmission Risk Interventions Readmission Risk Prevention Plan 04/30/2019  Transportation Screening Complete  PCP or Specialist Appt within 5-7 Days Complete  Home Care Screening Complete  Medication Review (RN CM) Complete  Some recent data might be hidden

## 2019-04-30 NOTE — Progress Notes (Signed)
[place] to be D/C'd Home per MD order.  Discussed prescriptions and follow up appointments with the patient. Prescriptions electronically submitted, medication list explained in detail. Pt verbalized understanding.  Allergies as of 04/30/2019   No Known Allergies     Medication List    STOP taking these medications   chlorthalidone 25 MG tablet Commonly known as:  HYGROTON   losartan 50 MG tablet Commonly known as:  COZAAR   meloxicam 15 MG tablet Commonly known as:  MOBIC   simvastatin 20 MG tablet Commonly known as:  ZOCOR     TAKE these medications   aspirin 81 MG chewable tablet Chew 81 mg by mouth daily.   atorvastatin 80 MG tablet Commonly known as:  LIPITOR Take 80 mg by mouth daily.   desvenlafaxine 50 MG 24 hr tablet Commonly known as:  PRISTIQ Take 50 mg by mouth daily.   felodipine 10 MG 24 hr tablet Commonly known as:  PLENDIL Take 10 mg by mouth daily.   metoprolol tartrate 25 MG tablet Commonly known as:  LOPRESSOR Take 0.5 tablets (12.5 mg total) by mouth 2 (two) times daily. Notes to patient:  Take 2nd dose this afternoon   pantoprazole 40 MG tablet Commonly known as:  PROTONIX Take 1 tablet (40 mg total) by mouth 2 (two) times daily before a meal. Notes to patient:  Take 2nd dose with dinner   ursodiol 300 MG capsule Commonly known as:  ACTIGALL Take 1 capsule (300 mg total) by mouth 2 (two) times daily. Notes to patient:  Take 2nd dose this afternoon            Durable Medical Equipment  (From admission, onward)         Start     Ordered   04/30/19 1041  For home use only DME Walker rolling  Once    Question:  Patient needs a walker to treat with the following condition  Answer:  Generalized weakness   04/30/19 1041   04/28/19 1026  For home use only DME Walker rolling  Once    Question:  Patient needs a walker to treat with the following condition  Answer:  Weakness   04/28/19 1026          Vitals:   04/30/19 0806  04/30/19 1025  BP: (!) 186/74 (!) 166/58  Pulse: 62 66  Resp: 19   Temp: 98.5 F (36.9 C)   SpO2: 99%     Skin clean, dry and intact without evidence of skin break down, no evidence of skin tears noted.f complications.Pt denies pain at this time. No complaints noted. Rolling walker at bedside  An After Visit Summary was printed and given to the patient. Patient waiting on ride to be D/C home.   [place]

## 2019-04-30 NOTE — Discharge Instructions (Signed)
It was so nice to meet you during this hospitalization!  You came into the hospital with nausea, vomiting, and diarrhea. We found that you had a bacterial infection of your intestines. You have already completed antibiotics for this. You also developed kidney injury because of being dehydrated. Your kidneys got better with fluids. You had some bleeding from your intestines and you had an endoscopy done that showed ulcers in your stomach. The gastroenterologist recommended that you take protonix 40mg  twice a day to help prevent this from happening in the future. You went into a funny heart rhythm and we have started a medication called metoprolol to help with this. You also developed a weakness in your face, but your MRI did not show a stroke.  I have made the following medication changes: 1. Please STOP taking losartan and meloxicam because of your kidney injury 2. Please take protonix 40mg  twice a day to help prevent you from developing ulcers in the future- you will need to be on this for the rest of your life. 3. Please take metoprolol 12.5mg  twice a day to help control your heart rate 4. Please take ursodiol 300mg  twice a day to help your liver.  You will need to follow-up with the kidney doctor, heart doctor, GI doctor, and brain doctor in the next couple of weeks.  Take care, Dr. Brett Albino

## 2019-04-30 NOTE — Discharge Summary (Signed)
Sarah Sullivan at [place] NAME: Sarah Sullivan    MR#:  [id number]  DATE OF BIRTH:  1946/07/17  DATE OF ADMISSION:  04/14/2019   ADMITTING PHYSICIAN: Saundra Shelling, MD  DATE OF DISCHARGE: 04/30/19  PRIMARY CARE PHYSICIAN: Ricardo Jericho, NP   ADMISSION DIAGNOSIS:  Dehydration [E86.0] Hypokalemia [E87.6] Ileus ([place]) [K56.7] Acute gastritis without hemorrhage, unspecified gastritis type [K29.00] DISCHARGE DIAGNOSIS:  Active Problems:   Hypokalemia   Colitis   Dehydration   Thrombocytopenia (HCC)   Acute renal failure (HCC)   Abnormal LFTs   Anemia due to chronic blood loss   Anemia  SECONDARY DIAGNOSIS:   Past Medical History:  Diagnosis Date   Anemia    Hypertension    HOSPITAL COURSE:   Sarah Sullivan is a 73 year old female who presented to the ED with nausea, vomiting, and diarrhea.  Patient appeared dehydrated in the ED and was admitted for further management.  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  -Treated with a course of ciprofloxacin and diarrhea/hematochezia completely resolved. -Underwent colonoscopy 6/4, which only showed non-bleeding internal and external hemorrhoids.  Peptic ulcer disease -Underwent endoscopy 04/26/19, which showed multiple gastric and duodenal ulcers.  -Treated with IV protonix -Will need protonix 40mg  bid long term per GI recommendations. -Meloxicam discontinued on discharge -Needs to f/u with GI as an outpatient.  AKI- patient developed AKI due to hypovolemia and hypotension in the setting of large volume hematochezia -Creatinine peaked at 5.39, improved to 2.00 on the day of discharge -Underwent one session of dialysis on 5/30 via [facility] that was placed on 5/29 -Seen by nephrology, who did not feel that patient needed any additional HD -Meloxicam, losartan, and chlorthalidone held on discharge -[facility] removed on the day of discharge -Needs  to f/u with nephrology as an outpatient  Paroxysmal atrial fibrillation with RVR- new onset. Developed on 6/5. Patient was in NSR on the day of discharge. -Treated with cardizem drip and then transitioned to po metoprolol 12.5mg  bid at discharge -Patient was not started on anticoagulation due to large volume GI bleed this admission -Needs to f/u with GI in 1 week for clearance to start anticoagulation -Also needs to f/u with cardiology  Acute right facial droop- may be due to TIA. -MRI brain was negative for stroke -MRA brain showed high grade stenosis -Seen by neurology -Discharged home on aspirin 81mg  daily -Needs to f/u with neurology as an outpatient -PT recommended home health  Elevated LFTs -Work-up was negative except for significantly elevated AMA levels -Discharged home on ursodiol 300mg  bid per GI recommendations -F/u with GI as an outpatient  DISCHARGE CONDITIONS:  Enteropathogenic E. coli infection with colitis and hematochezia Peptic ulcer disease Acute kidney injury Atrial fibrillation with RVR Acute right facial droop- ?TIA Thrombocytopenia Elevated LFTs CONSULTS OBTAINED:  Treatment Team:  Anthonette Legato, MD Earlie Server, MD Catarina Hartshorn, MD DRUG ALLERGIES:  No Known Allergies DISCHARGE MEDICATIONS:   Allergies as of 04/30/2019   No Known Allergies     Medication List    STOP taking these medications   chlorthalidone 25 MG tablet Commonly known as:  HYGROTON   losartan 50 MG tablet Commonly known as:  COZAAR   meloxicam 15 MG tablet Commonly known as:  MOBIC   simvastatin 20 MG tablet Commonly known as:  ZOCOR     TAKE these medications   aspirin 81 MG chewable tablet Chew 81 mg by mouth  daily.   atorvastatin 80 MG tablet Commonly known as:  LIPITOR Take 80 mg by mouth daily.   desvenlafaxine 50 MG 24 hr tablet Commonly known as:  PRISTIQ Take 50 mg by mouth daily.   felodipine 10 MG 24 hr tablet Commonly known as:  PLENDIL Take  10 mg by mouth daily.   metoprolol tartrate 25 MG tablet Commonly known as:  LOPRESSOR Take 0.5 tablets (12.5 mg total) by mouth 2 (two) times daily. Notes to patient:  Take 2nd dose this afternoon   pantoprazole 40 MG tablet Commonly known as:  PROTONIX Take 1 tablet (40 mg total) by mouth 2 (two) times daily before a meal. Notes to patient:  Take 2nd dose with dinner   ursodiol 300 MG capsule Commonly known as:  ACTIGALL Take 1 capsule (300 mg total) by mouth 2 (two) times daily. Notes to patient:  Take 2nd dose this afternoon            Durable Medical Equipment  (From admission, onward)         Start     Ordered   04/30/19 1041  For home use only DME Walker rolling  Once    Question:  Patient needs a walker to treat with the following condition  Answer:  Generalized weakness   04/30/19 1041   04/28/19 1026  For home use only DME Walker rolling  Once    Question:  Patient needs a walker to treat with the following condition  Answer:  Weakness   04/28/19 1026           DISCHARGE INSTRUCTIONS:  1.  Follow-up with PCP in 5 days 2.  Follow-up with gastroenterology in 1 week 3.  Needs to be started on anticoagulation once cleared from a GI perspective 4.  Take Protonix 40 mg twice daily long-term 5.  Needs to follow-up with cardiology in 2 weeks 6.  Take metoprolol 12.5 mg twice daily 7.  Take ursodiol 300 mg twice daily per gastroenterology recommendations 8.  Stop losartan, meloxicam, and chlorthalidone on discharge 9.  Needs follow-up with neurology on discharge 10.  Needs a follow-up with nephrology on discharge DIET:  Cardiac diet DISCHARGE CONDITION:  Stable ACTIVITY:  Activity as tolerated OXYGEN:  Home Oxygen: No.  Oxygen Delivery: room air DISCHARGE LOCATION:  home   If you experience worsening of your admission symptoms, develop shortness of breath, life threatening emergency, suicidal or homicidal thoughts you must seek medical attention  immediately by calling 911 or calling your MD immediately  if symptoms less severe.  You Must read complete instructions/literature along with all the possible adverse reactions/side effects for all the Medicines you take and that have been prescribed to you. Take any new Medicines after you have completely understood and accpet all the possible adverse reactions/side effects.   Please note  You were cared for by a hospitalist during your hospital stay. If you have any questions about your discharge medications or the care you received while you were in the hospital after you are discharged, you can call the unit and asked to speak with the hospitalist on call if the hospitalist that took care of you is not available. Once you are discharged, your primary care physician will handle any further medical issues. Please note that NO REFILLS for any discharge medications will be authorized once you are discharged, as it is imperative that you return to your primary care physician (or establish a relationship with a primary care physician if  you do not have one) for your aftercare needs so that they can reassess your need for medications and monitor your lab values.    On the day of Discharge:  VITAL SIGNS:  Blood pressure (!) 166/58, pulse 66, temperature 98.5 F (36.9 C), temperature source Oral, resp. rate 19, height  (1.499 m), weight 57.4 kg, SpO2 99 %. PHYSICAL EXAMINATION:  GENERAL:  74 y.o.-year-old patient lying in the bed with no acute distress.  Well-appearing. EYES: Pupils equal, round, reactive to light and accommodation. No scleral icterus. Extraocular muscles intact.  HEENT: Head atraumatic, normocephalic. Oropharynx and nasopharynx clear.  NECK:  Supple, no jugular venous distention. No thyroid enlargement, no tenderness.  LUNGS: Normal breath sounds bilaterally, no wheezing, rales,rhonchi or crepitation. No use of accessory muscles of respiration.  CARDIOVASCULAR: RRR, S1, S2  normal. No murmurs, rubs, or gallops.  ABDOMEN: Soft, non-tender, non-distended. Bowel sounds present. No organomegaly or mass.  EXTREMITIES: No pedal edema, cyanosis, or clubbing.  NEUROLOGIC: Cranial nerves II through XII are intact. + Global weakness. Sensation intact. Gait not checked.  PSYCHIATRIC: The patient is alert and oriented x 3.  SKIN: No obvious rash, lesion, or ulcer.  DATA REVIEW:   CBC Recent Labs  Lab 04/30/19 0552  WBC 8.1  HGB 8.2*  HCT 25.7*  PLT 119*    Chemistries  Recent Labs  Lab 04/30/19 0552  NA 146*  K 3.6  CL 108  CO2 30  GLUCOSE 87  BUN 45*  CREATININE 2.00*  CALCIUM 8.5*  MG 1.9  AST 43*  ALT 54*  ALKPHOS 299*  BILITOT 0.8     Microbiology Results  Results for orders placed or performed during the hospital encounter of 04/14/19  SARS Coronavirus 2 (CEPHEID - Performed in [facility] hospital lab), Hosp Order     Status: None   Collection Time: 04/14/19 10:32 AM  Result Value Ref Range Status   SARS Coronavirus 2 NEGATIVE NEGATIVE Final    Comment: (NOTE) If result is NEGATIVE SARS-CoV-2 target nucleic acids are NOT DETECTED. The SARS-CoV-2 RNA is generally detectable in upper and lower  respiratory specimens during the acute phase of infection. The lowest  concentration of SARS-CoV-2 viral copies this assay can detect is 250  copies / mL. A negative result does not preclude SARS-CoV-2 infection  and should not be used as the sole basis for treatment or other  patient management decisions.  A negative result may occur with  improper specimen collection / handling, submission of specimen other  than nasopharyngeal swab, presence of viral mutation(s) within the  areas targeted by this assay, and inadequate number of viral copies  (<250 copies / mL). A negative result must be combined with clinical  observations, patient history, and epidemiological information. If result is POSITIVE SARS-CoV-2 target nucleic acids are  DETECTED. The SARS-CoV-2 RNA is generally detectable in upper and lower  respiratory specimens dur ing the acute phase of infection.  Positive  results are indicative of active infection with SARS-CoV-2.  Clinical  correlation with patient history and other diagnostic information is  necessary to determine patient infection status.  Positive results do  not rule out bacterial infection or co-infection with other viruses. If result is PRESUMPTIVE POSTIVE SARS-CoV-2 nucleic acids MAY BE PRESENT.   A presumptive positive result was obtained on the submitted specimen  and confirmed on repeat testing.  While 2019 novel coronavirus  (SARS-CoV-2) nucleic acids may be present in the submitted sample  additional confirmatory testing  may be necessary for epidemiological  and / or clinical management purposes  to differentiate between  SARS-CoV-2 and other Sarbecovirus currently known to infect humans.  If clinically indicated additional testing with an alternate test  methodology [phone]([id number]) is advised. The SARS-CoV-2 RNA is generally  detectable in upper and lower respiratory sp ecimens during the acute  phase of infection. The expected result is Negative. Fact Sheet for Patients:  BoilerBrush.com.cyhttps://www.fda.gov/media/136312/download Fact Sheet for Healthcare Providers: https://pope.com/https://www.fda.gov/media/136313/download This test is not yet approved or cleared by the [place] FDA and has been authorized for detection and/or diagnosis of SARS-CoV-2 by FDA under an Emergency Use Authorization (EUA).  This EUA will remain in effect (meaning this test can be used) for the duration of the COVID-19 declaration under Section 564(b)(1) of the Act, 21 U.S.C. section 360bbb-3(b)(1), unless the authorization is terminated or revoked sooner. Performed at [facility], [address] [place], [place] [address]   MRSA PCR Screening     Status: None   Collection Time: 04/14/19  7:22 PM  Result Value Ref Range  Status   MRSA by PCR NEGATIVE NEGATIVE Final    Comment:        The GeneXpert MRSA Assay (FDA approved for NASAL specimens only), is one component of a comprehensive MRSA colonization surveillance program. It is not intended to diagnose MRSA infection nor to guide or monitor treatment for MRSA infections. Performed at [facility], [address] [place], [place] [address]   CULTURE, BLOOD (ROUTINE X 2) w Reflex to ID Panel     Status: None   Collection Time: 04/16/19  7:47 AM  Result Value Ref Range Status   Specimen Description BLOOD BLOOD RIGHT HAND  Final   Special Requests   Final    BOTTLES DRAWN AEROBIC AND ANAEROBIC Blood Culture adequate volume   Culture   Final    NO GROWTH 5 DAYS Performed at [facility], [address] [place], [place] [address]    Report Status 04/21/2019 FINAL  Final  CULTURE, BLOOD (ROUTINE X 2) w Reflex to ID Panel     Status: None   Collection Time: 04/16/19  9:27 AM  Result Value Ref Range Status   Specimen Description BLOOD BLOOD RIGHT HAND  Final   Special Requests   Final    BOTTLES DRAWN AEROBIC ONLY Blood Culture adequate volume   Culture   Final    NO GROWTH 5 DAYS Performed at [facility], [address] [place], [place] [address]    Report Status 04/21/2019 FINAL  Final  MRSA PCR Screening     Status: None   Collection Time: 04/17/19  1:47 PM  Result Value Ref Range Status   MRSA by PCR NEGATIVE NEGATIVE Final    Comment:        The GeneXpert MRSA Assay (FDA approved for NASAL specimens only), is one component of a comprehensive MRSA colonization surveillance program. It is not intended to diagnose MRSA infection nor to guide or monitor treatment for MRSA infections. Performed at [facility], [address] [place], [place] [address]   Gastrointestinal Panel by PCR , Stool     Status: Abnormal   Collection Time: 04/17/19 10:42 PM  Result Value Ref Range Status    Campylobacter species NOT DETECTED NOT DETECTED Final   Plesimonas shigelloides NOT DETECTED NOT DETECTED Final   Salmonella species NOT DETECTED NOT DETECTED Final   Yersinia enterocolitica NOT DETECTED NOT DETECTED Final  Vibrio species NOT DETECTED NOT DETECTED Final   Vibrio cholerae NOT DETECTED NOT DETECTED Final   Enteroaggregative E coli (EAEC) NOT DETECTED NOT DETECTED Final   Enteropathogenic E coli (EPEC) DETECTED (A) NOT DETECTED Final    Comment: RESULT CALLED TO, READ BACK BY AND VERIFIED WITH: BETH BUONO AT 0211 04/19/2019 SDR    Enterotoxigenic E coli (ETEC) NOT DETECTED NOT DETECTED Final   Shiga like toxin producing E coli (STEC) NOT DETECTED NOT DETECTED Final   Shigella/Enteroinvasive E coli (EIEC) NOT DETECTED NOT DETECTED Final   Cryptosporidium NOT DETECTED NOT DETECTED Final   Cyclospora cayetanensis NOT DETECTED NOT DETECTED Final   Entamoeba histolytica NOT DETECTED NOT DETECTED Final   Giardia lamblia NOT DETECTED NOT DETECTED Final   Adenovirus F40/41 NOT DETECTED NOT DETECTED Final   Astrovirus NOT DETECTED NOT DETECTED Final   Norovirus GI/GII NOT DETECTED NOT DETECTED Final   Rotavirus A NOT DETECTED NOT DETECTED Final   Sapovirus (I, II, IV, and V) NOT DETECTED NOT DETECTED Final    Comment: Performed at [facility], [address] [place], [place] [address]  C difficile quick scan w PCR reflex     Status: None   Collection Time: 04/18/19 10:42 PM  Result Value Ref Range Status   C Diff antigen NEGATIVE NEGATIVE Final   C Diff toxin NEGATIVE NEGATIVE Final   C Diff interpretation No C. difficile detected.  Final    Comment: Performed at [facility], [address] [place], [place] [address]  Novel Coronavirus, NAA (hospital order; send-out to ref lab)     Status: None   Collection Time: 04/25/19  5:04 PM  Result Value Ref Range Status   SARS-CoV-2, NAA NOT DETECTED NOT DETECTED Final    Comment: (NOTE) This test was developed  and its performance characteristics determined by [facility]. This test has not been FDA cleared or approved. This test has been authorized by FDA under an Emergency Use Authorization (EUA). This test is only authorized for the duration of time the declaration that circumstances exist justifying the authorization of the emergency use of in vitro diagnostic tests for detection of SARS-CoV-2 virus and/or diagnosis of COVID-19 infection under section 564(b)(1) of the Act, 21 U.S.C. 098JXB-1(Y)(7360bbb-3(b)(1), unless the authorization is terminated or revoked sooner. When diagnostic testing is negative, the possibility of a false negative result should be considered in the context of a patient's recent exposures and the presence of clinical signs and symptoms consistent with COVID-19. An individual without symptoms of COVID-19 and who is not shedding SARS-CoV-2 virus would expect to have a negative (not detected) result in this assay. Performed  At: [facility] [place] [address] [place], [place] [id number] Jolene SchimkeNagendra Sanjai MD QM:[id number]:[phone]    Coronavirus Source NASOPHARYNGEAL  Final    Comment: Performed at [facility], [address] [place], [place] [address]    RADIOLOGY:  No results found.   Management plans discussed with the patient, family and they are in agreement.  CODE STATUS: Full Code   TOTAL TIME TAKING CARE OF THIS PATIENT: 55 minutes.    Jinny BlossomKaty D Johnita Palleschi M.D on 04/30/2019 at 12:49 PM  Between 7am to 6pm - Pager - [phone]  After 6pm go to [email or web address] - Scientist, research (life sciences)password [email or web address] [email or web address]  [facility]  Office  [phone]  CC: Primary care physician; White, Arlyss RepressElizabeth Burney, NP   Note: This dictation was prepared with Dragon dictation along with smaller phrase technology. Any transcriptional errors  that result from this process are unintentional.

## 2019-04-30 NOTE — Progress Notes (Signed)
Patient placed in supine position. Sterile technique used to removed trialysis cath from left femoral. Patient instructed to bear down while removing catheter. Vaseline guaze and 4x4 sterile dressing applied, pressure held for 10 minutes. Scant amount of blood, 30cm catheter removed tip intact. Tegaderm applied over dressing. Instructed patient to remain supine for 20 min. Will continue to monitor. Instructed patient to monitor site if any bleeding noted to call immediately.

## 2019-05-12 ENCOUNTER — Encounter: Payer: Self-pay | Admitting: Gastroenterology

## 2019-05-12 ENCOUNTER — Ambulatory Visit: Payer: Medicare Other | Admitting: Gastroenterology

## 2019-05-12 ENCOUNTER — Other Ambulatory Visit: Payer: Self-pay

## 2019-11-08 ENCOUNTER — Emergency Department: Payer: Medicare Other

## 2019-11-08 ENCOUNTER — Encounter: Payer: Self-pay | Admitting: Emergency Medicine

## 2019-11-08 ENCOUNTER — Other Ambulatory Visit: Payer: Self-pay

## 2019-11-08 ENCOUNTER — Emergency Department
Admission: EM | Admit: 2019-11-08 | Discharge: 2019-11-08 | Disposition: A | Discharge status: Home / Self Care | Payer: Medicare Other | Attending: Emergency Medicine | Admitting: Emergency Medicine

## 2019-11-08 DIAGNOSIS — Y93I9 Activity, other involving external motion: Secondary | ICD-10-CM | POA: Diagnosis not present

## 2019-11-08 DIAGNOSIS — Y92410 Unspecified street and highway as the place of occurrence of the external cause: Secondary | ICD-10-CM | POA: Insufficient documentation

## 2019-11-08 DIAGNOSIS — I10 Essential (primary) hypertension: Secondary | ICD-10-CM | POA: Insufficient documentation

## 2019-11-08 DIAGNOSIS — Y999 Unspecified external cause status: Secondary | ICD-10-CM | POA: Diagnosis not present

## 2019-11-08 DIAGNOSIS — S4991XA Unspecified injury of right shoulder and upper arm, initial encounter: Secondary | ICD-10-CM | POA: Diagnosis present

## 2019-11-08 DIAGNOSIS — Z7982 Long term (current) use of aspirin: Secondary | ICD-10-CM | POA: Diagnosis not present

## 2019-11-08 DIAGNOSIS — S42141A Displaced fracture of glenoid cavity of scapula, right shoulder, initial encounter for closed fracture: Secondary | ICD-10-CM | POA: Diagnosis not present

## 2019-11-08 DIAGNOSIS — Z79899 Other long term (current) drug therapy: Secondary | ICD-10-CM | POA: Diagnosis not present
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
## 2019-11-08 MED ORDER — ACETAMINOPHEN 325 MG PO TABS: 650.0000 mg | ORAL_TABLET | Freq: Four times a day (QID) | ORAL | 0 refills | Status: DC | PRN

## 2019-11-08 MED ORDER — ACETAMINOPHEN 325 MG PO TABS: 650.0000 mg | ORAL_TABLET | Freq: Four times a day (QID) | ORAL | 0 refills | Status: AC | PRN

## 2019-11-08 MED ORDER — LIDOCAINE 5 % EX PTCH: 1.0000 | MEDICATED_PATCH | CUTANEOUS | 0 refills | Status: DC

## 2019-11-08 NOTE — Discharge Instructions (Addendum)
Your x-ray shows that you might have a small fracture in your shoulder.  Please wear shoulder sling.  Ice shoulder tonight.  Call orthopedics for an appointment for reevaluation.
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
## 2019-12-14 DIAGNOSIS — M48062 Spinal stenosis, lumbar region with neurogenic claudication: Secondary | ICD-10-CM | POA: Diagnosis not present

## 2019-12-14 DIAGNOSIS — M5416 Radiculopathy, lumbar region: Secondary | ICD-10-CM | POA: Diagnosis not present

## 2019-12-14 DIAGNOSIS — M5136 Other intervertebral disc degeneration, lumbar region: Secondary | ICD-10-CM | POA: Diagnosis not present

## 2019-12-18 DIAGNOSIS — M19011 Primary osteoarthritis, right shoulder: Secondary | ICD-10-CM | POA: Diagnosis not present

## 2019-12-18 DIAGNOSIS — S4351XD Sprain of right acromioclavicular joint, subsequent encounter: Secondary | ICD-10-CM | POA: Diagnosis not present

## 2019-12-18 DIAGNOSIS — M25511 Pain in right shoulder: Secondary | ICD-10-CM | POA: Diagnosis not present

## 2019-12-18 DIAGNOSIS — S4991XD Unspecified injury of right shoulder and upper arm, subsequent encounter: Secondary | ICD-10-CM | POA: Diagnosis not present

## 2020-01-04 ENCOUNTER — Ambulatory Visit: Payer: Medicare Other

## 2020-01-05 ENCOUNTER — Other Ambulatory Visit: Payer: Self-pay

## 2020-01-05 ENCOUNTER — Ambulatory Visit: Payer: Medicare HMO | Attending: Internal Medicine

## 2020-01-05 DIAGNOSIS — Z23 Encounter for immunization: Secondary | ICD-10-CM

## 2020-01-05 NOTE — Progress Notes (Signed)
   Covid-19 Vaccination Clinic  Name:  Sarah Sullivan    MRN: [id number] DOB: 09-14-1946  01/05/2020  Sarah Sullivan was observed post Covid-19 immunization for 15 minutes without incidence. She was provided with Vaccine Information Sheet and instruction to access the V-Safe system.   Sarah Sullivan was instructed to call 911 with any severe reactions post vaccine: Marland Kitchen Difficulty breathing  . Swelling of your face and throat  . A fast heartbeat  . A bad rash all over your body  . Dizziness and weakness    Immunizations Administered    Name Date Dose VIS Date Route   Pfizer COVID-19 Vaccine 01/05/2020 10:47 AM 0.3 mL 11/03/2019 Intramuscular   Manufacturer: [employer], [employer]   Lot: [phone] [id number]   NDC: [address]-6

## 2020-01-16 DIAGNOSIS — M48062 Spinal stenosis, lumbar region with neurogenic claudication: Secondary | ICD-10-CM | POA: Diagnosis not present

## 2020-01-16 DIAGNOSIS — M5136 Other intervertebral disc degeneration, lumbar region: Secondary | ICD-10-CM | POA: Diagnosis not present

## 2020-01-16 DIAGNOSIS — M5416 Radiculopathy, lumbar region: Secondary | ICD-10-CM | POA: Diagnosis not present

## 2020-01-22 DIAGNOSIS — S4991XD Unspecified injury of right shoulder and upper arm, subsequent encounter: Secondary | ICD-10-CM | POA: Diagnosis not present

## 2020-01-22 DIAGNOSIS — M25511 Pain in right shoulder: Secondary | ICD-10-CM | POA: Diagnosis not present

## 2020-01-22 DIAGNOSIS — M19011 Primary osteoarthritis, right shoulder: Secondary | ICD-10-CM | POA: Diagnosis not present

## 2020-01-22 DIAGNOSIS — S4351XD Sprain of right acromioclavicular joint, subsequent encounter: Secondary | ICD-10-CM | POA: Diagnosis not present

## 2020-01-23 DIAGNOSIS — F329 Major depressive disorder, single episode, unspecified: Secondary | ICD-10-CM | POA: Diagnosis not present

## 2020-01-23 DIAGNOSIS — K219 Gastro-esophageal reflux disease without esophagitis: Secondary | ICD-10-CM | POA: Diagnosis not present

## 2020-01-23 DIAGNOSIS — E785 Hyperlipidemia, unspecified: Secondary | ICD-10-CM | POA: Diagnosis not present

## 2020-01-23 DIAGNOSIS — I10 Essential (primary) hypertension: Secondary | ICD-10-CM | POA: Diagnosis not present

## 2020-01-23 DIAGNOSIS — Z79899 Other long term (current) drug therapy: Secondary | ICD-10-CM | POA: Diagnosis not present

## 2020-01-30 ENCOUNTER — Ambulatory Visit: Payer: Medicare HMO | Attending: Internal Medicine

## 2020-01-30 DIAGNOSIS — Z23 Encounter for immunization: Secondary | ICD-10-CM | POA: Insufficient documentation

## 2020-01-30 NOTE — Progress Notes (Signed)
   Covid-19 Vaccination Clinic  Name:  Sarah Sullivan    MRN: [id number] DOB: 03/06/1946  01/30/2020  Sarah Sullivan was observed post Covid-19 immunization for 15 minutes without incident. She was provided with Vaccine Information Sheet and instruction to access the V-Safe system.   Sarah Sullivan was instructed to call 911 with any severe reactions post vaccine: Marland Kitchen Difficulty breathing  . Swelling of face and throat  . A fast heartbeat  . A bad rash all over body  . Dizziness and weakness   Immunizations Administered    Name Date Dose VIS Date Route   Pfizer COVID-19 Vaccine 01/30/2020 11:48 AM 0.3 mL 11/03/2019 Intramuscular   Manufacturer: [employer], [employer]   Lot: [id number]   NDC: [address]-6

## 2020-04-23 DIAGNOSIS — Z13220 Encounter for screening for lipoid disorders: Secondary | ICD-10-CM | POA: Diagnosis not present

## 2020-04-23 DIAGNOSIS — Z1231 Encounter for screening mammogram for malignant neoplasm of breast: Secondary | ICD-10-CM | POA: Diagnosis not present

## 2020-04-23 DIAGNOSIS — I10 Essential (primary) hypertension: Secondary | ICD-10-CM | POA: Diagnosis not present

## 2020-04-23 DIAGNOSIS — Z0000 Encounter for general adult medical examination without abnormal findings: Secondary | ICD-10-CM | POA: Diagnosis not present

## 2020-04-23 DIAGNOSIS — F4321 Adjustment disorder with depressed mood: Secondary | ICD-10-CM | POA: Diagnosis not present

## 2020-05-07 DIAGNOSIS — I10 Essential (primary) hypertension: Secondary | ICD-10-CM | POA: Diagnosis not present

## 2020-05-07 DIAGNOSIS — M5136 Other intervertebral disc degeneration, lumbar region: Secondary | ICD-10-CM | POA: Diagnosis not present

## 2020-05-10 ENCOUNTER — Other Ambulatory Visit: Payer: Self-pay | Admitting: Family Medicine

## 2020-05-13 ENCOUNTER — Other Ambulatory Visit: Payer: Self-pay | Admitting: Family Medicine

## 2020-05-13 DIAGNOSIS — Z1231 Encounter for screening mammogram for malignant neoplasm of breast: Secondary | ICD-10-CM

## 2020-05-28 DIAGNOSIS — M545 Low back pain: Secondary | ICD-10-CM | POA: Diagnosis not present

## 2020-05-28 DIAGNOSIS — F4321 Adjustment disorder with depressed mood: Secondary | ICD-10-CM | POA: Diagnosis not present

## 2020-05-28 DIAGNOSIS — F329 Major depressive disorder, single episode, unspecified: Secondary | ICD-10-CM | POA: Diagnosis not present

## 2020-05-31 ENCOUNTER — Ambulatory Visit
Admission: RE | Admit: 2020-05-31 | Discharge: 2020-05-31 | Disposition: A | Discharge status: Home / Self Care | Payer: Medicare HMO | Source: Ambulatory Visit | Attending: Family Medicine | Admitting: Family Medicine

## 2020-05-31 DIAGNOSIS — Z1231 Encounter for screening mammogram for malignant neoplasm of breast: Secondary | ICD-10-CM | POA: Insufficient documentation

## 2020-05-31 IMAGING — MG DIGITAL SCREENING BILAT W/ TOMO W/ CAD
6 of 10 series · 6 of 30 positions shown · non-contrast
Comparison: Previous exam(s).

CLINICAL DATA: Screening.

EXAM:
DIGITAL SCREENING BILATERAL MAMMOGRAM WITH TOMO AND CAD

[L MLO synth-2D]
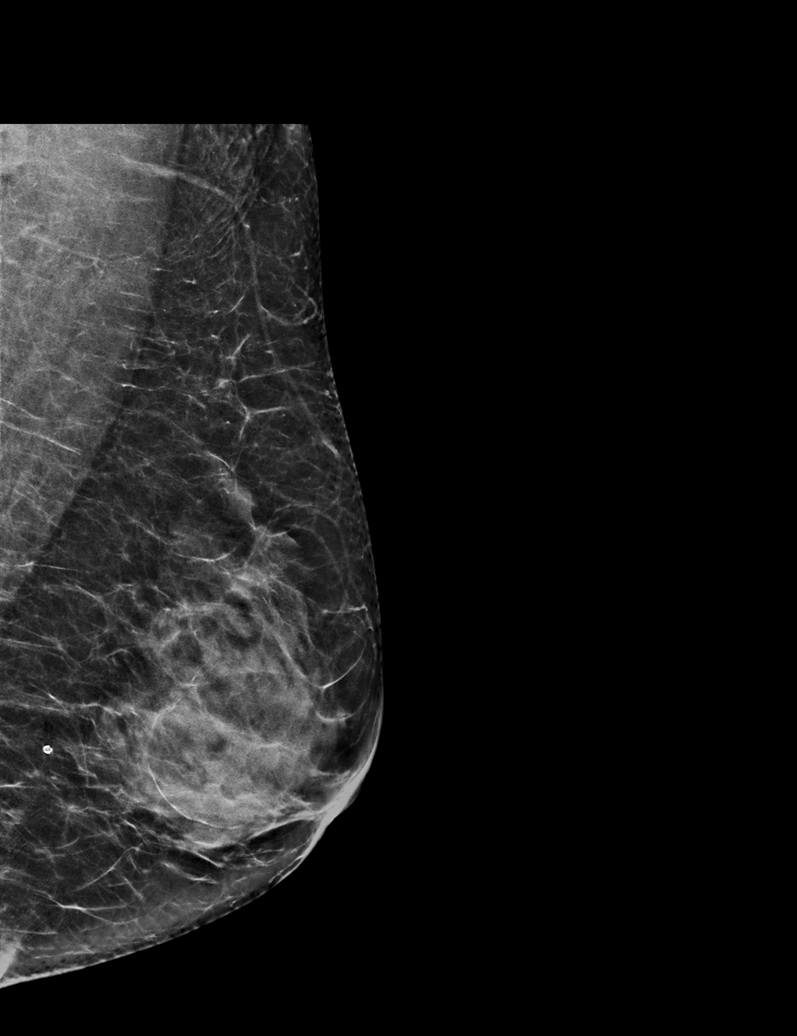

[R CC synth-2D (1 of 2)]
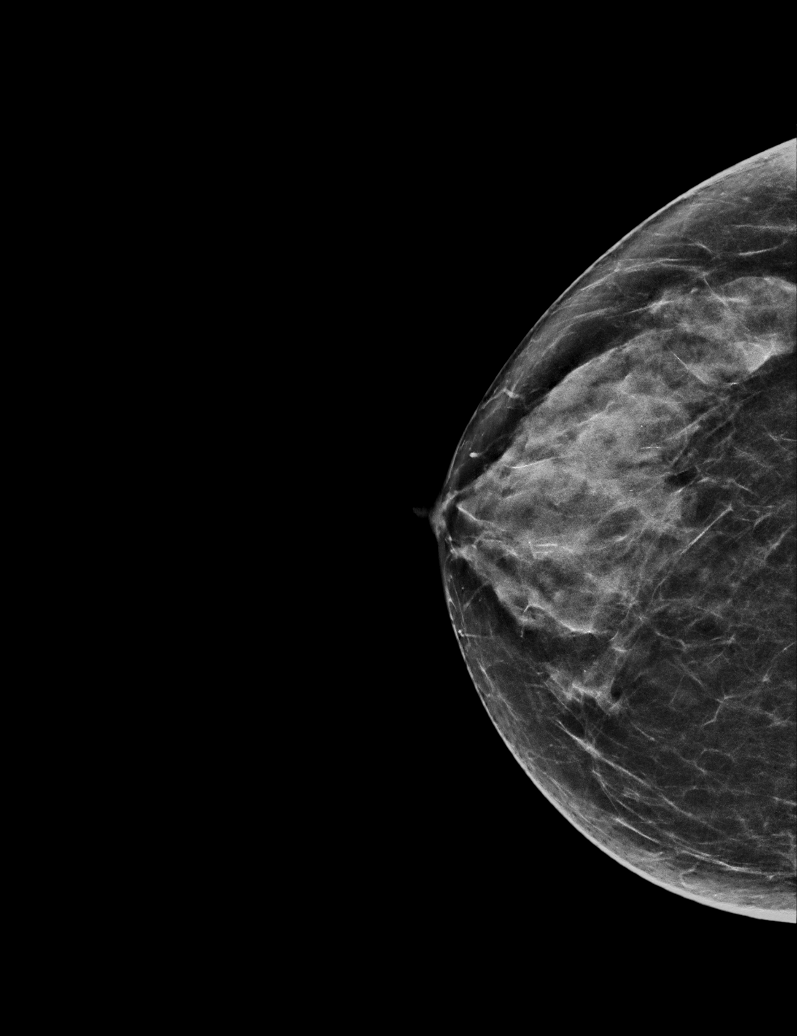

[R CC synth-2D (2 of 2)]
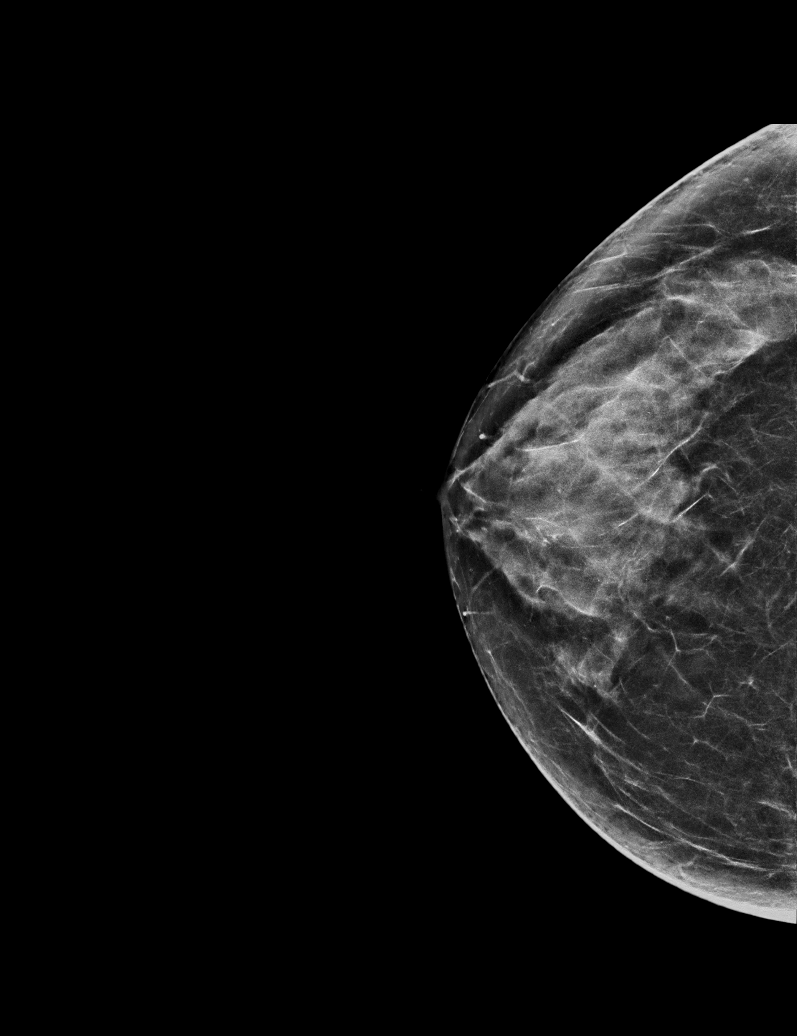

[R MLO synth-2D]
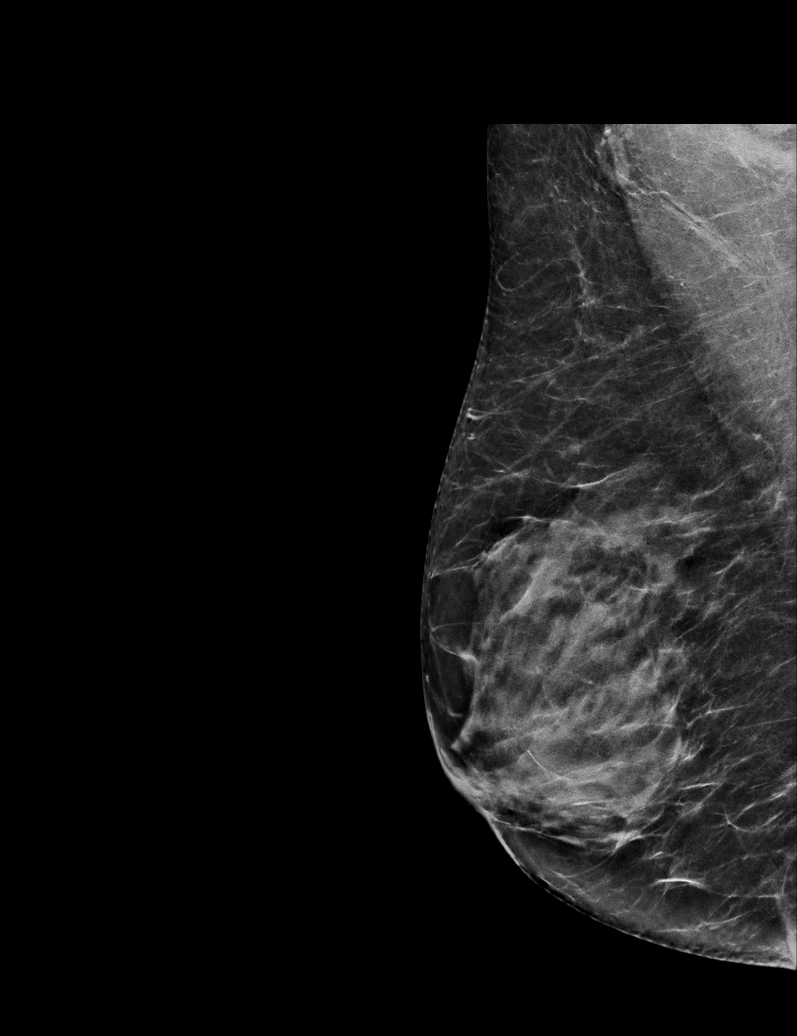

[L CC synth-2D]
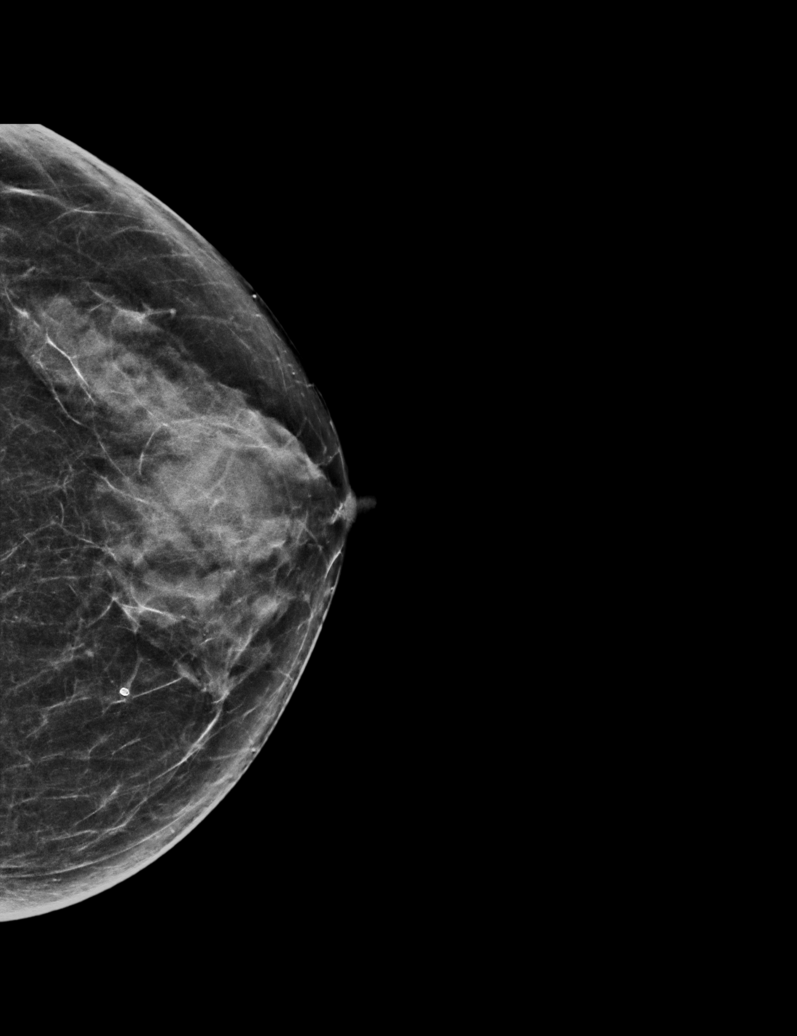

[R MLO tomo · tomo slice 31/62.0]
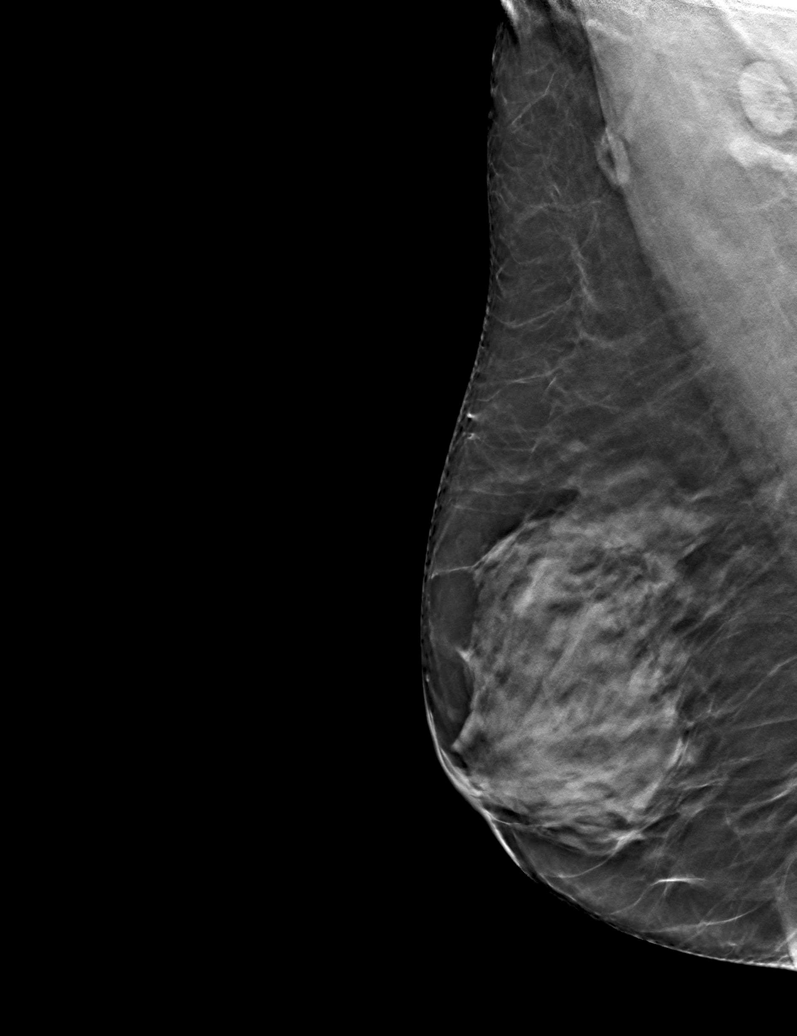

[6 of 30 positions shown; findings below may reference images not displayed]

ACR Breast Density Category c: The breast tissue is heterogeneously
dense, which may obscure small masses.
FINDINGS: There are no findings suspicious for malignancy. Images were
processed with CAD.
IMPRESSION: No mammographic evidence of malignancy. A result letter of this
screening mammogram will be mailed directly to the patient.

RECOMMENDATION:
Screening mammogram in one year. (Code:[5V])

BI-RADS CATEGORY  1: Negative.

## 2020-06-13 DIAGNOSIS — M5441 Lumbago with sciatica, right side: Secondary | ICD-10-CM | POA: Diagnosis not present

## 2020-06-13 DIAGNOSIS — G8929 Other chronic pain: Secondary | ICD-10-CM | POA: Diagnosis not present

## 2020-06-13 DIAGNOSIS — Z981 Arthrodesis status: Secondary | ICD-10-CM | POA: Diagnosis not present

## 2020-06-13 DIAGNOSIS — I700 Atherosclerosis of aorta: Secondary | ICD-10-CM | POA: Diagnosis not present

## 2020-06-13 DIAGNOSIS — M4326 Fusion of spine, lumbar region: Secondary | ICD-10-CM | POA: Diagnosis not present

## 2020-06-13 DIAGNOSIS — M4316 Spondylolisthesis, lumbar region: Secondary | ICD-10-CM | POA: Diagnosis not present

## 2020-06-14 ENCOUNTER — Other Ambulatory Visit: Payer: Self-pay | Admitting: Nurse Practitioner

## 2020-06-14 DIAGNOSIS — G8929 Other chronic pain: Secondary | ICD-10-CM

## 2020-06-14 DIAGNOSIS — M5441 Lumbago with sciatica, right side: Secondary | ICD-10-CM

## 2020-06-28 DIAGNOSIS — M5441 Lumbago with sciatica, right side: Secondary | ICD-10-CM | POA: Diagnosis not present

## 2020-06-28 DIAGNOSIS — G8929 Other chronic pain: Secondary | ICD-10-CM | POA: Diagnosis not present

## 2020-06-28 DIAGNOSIS — M6281 Muscle weakness (generalized): Secondary | ICD-10-CM | POA: Diagnosis not present

## 2020-06-28 DIAGNOSIS — M5442 Lumbago with sciatica, left side: Secondary | ICD-10-CM | POA: Diagnosis not present

## 2020-07-08 ENCOUNTER — Ambulatory Visit
Admission: RE | Admit: 2020-07-08 | Discharge: 2020-07-08 | Disposition: A | Discharge status: Home / Self Care | Payer: Medicare HMO | Source: Ambulatory Visit | Attending: Nurse Practitioner | Admitting: Nurse Practitioner

## 2020-07-08 ENCOUNTER — Other Ambulatory Visit: Payer: Self-pay

## 2020-07-08 DIAGNOSIS — M48061 Spinal stenosis, lumbar region without neurogenic claudication: Secondary | ICD-10-CM | POA: Diagnosis not present

## 2020-07-08 DIAGNOSIS — G8929 Other chronic pain: Secondary | ICD-10-CM | POA: Insufficient documentation

## 2020-07-08 DIAGNOSIS — M47816 Spondylosis without myelopathy or radiculopathy, lumbar region: Secondary | ICD-10-CM | POA: Diagnosis not present

## 2020-07-08 DIAGNOSIS — M5126 Other intervertebral disc displacement, lumbar region: Secondary | ICD-10-CM | POA: Diagnosis not present

## 2020-07-08 DIAGNOSIS — M5441 Lumbago with sciatica, right side: Secondary | ICD-10-CM | POA: Insufficient documentation

## 2020-07-08 DIAGNOSIS — S3210XA Unspecified fracture of sacrum, initial encounter for closed fracture: Secondary | ICD-10-CM | POA: Diagnosis not present

## 2020-07-08 IMAGING — MR MR LUMBAR SPINE W/O CM
4 of 5 series · 34 of 48 positions shown · non-contrast
Comparison: MRI lumbar spine [DATE].

CLINICAL DATA: Bilateral lower extremity pain, right worse than
left for 6 months to 1 year. Remote history of lumbar surgery.

EXAM:
MRI LUMBAR SPINE WITHOUT CONTRAST
TECHNIQUE: Multiplanar, multisequence MR imaging of the lumbar spine was
performed. No intravenous contrast was administered.

[Series 5: T2 · sagittal · 4.0mm · 0.81mm/px · 8 of 17 slices shown (1 of 2)]
[im 1/17]
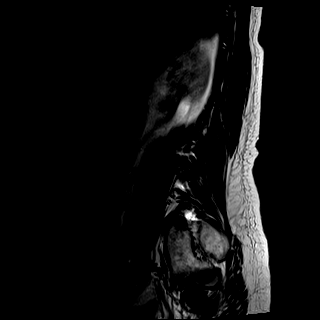
[im 3/17]
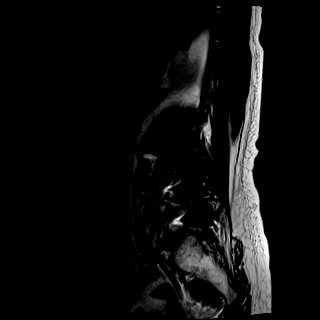
[im 5/17]
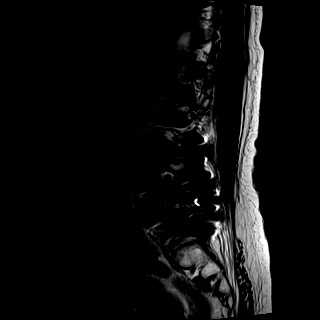
[im 7/17]
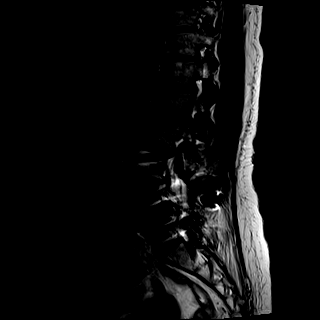
[im 10/17]
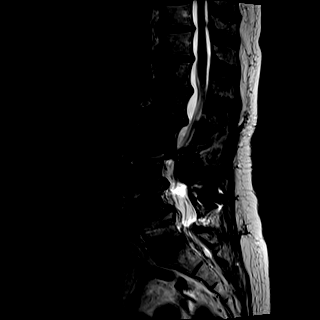
[im 12/17]
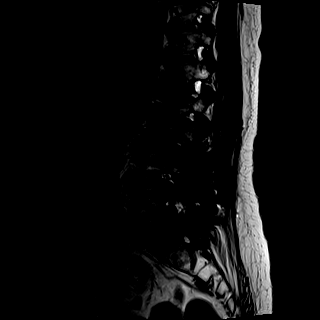
[im 14/17]
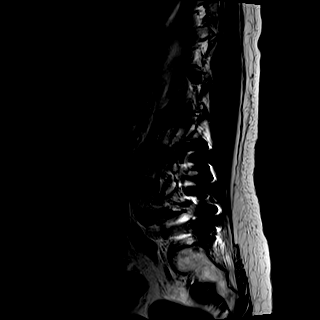
[im 17/17]
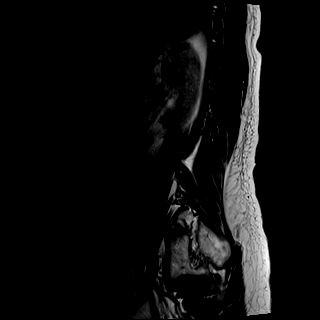

[Series 6: T1 · sagittal · 4.0mm · 0.81mm/px · 8 of 17 slices shown (1 of 2)]
[im 1/17]
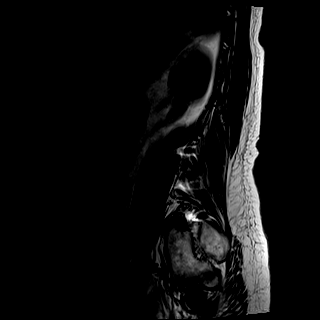
[im 3/17]
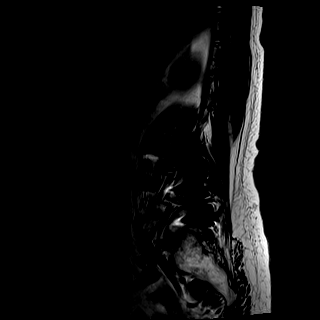
[im 5/17]
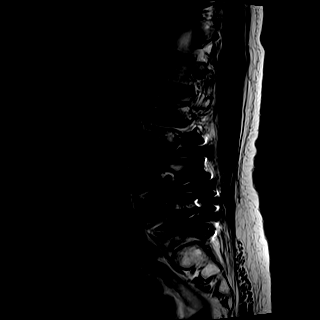
[im 7/17]
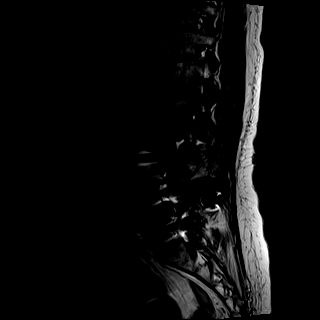
[im 10/17]
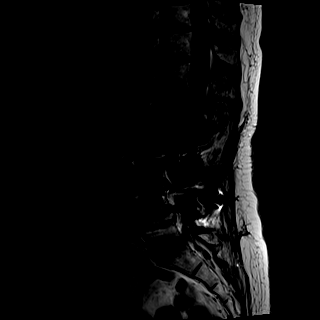
[im 12/17]
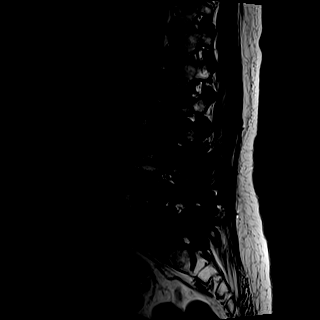
[im 14/17]
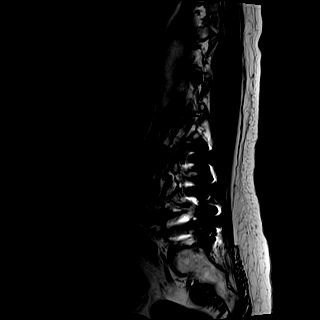
[im 17/17]
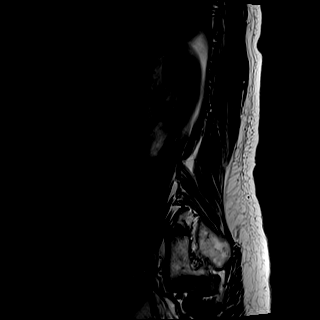

[Series 8: T2 · axial · 4.0mm · 0.78mm/px · z∈[-47,+112]mm · 9 of 27 slices shown (2 of 2)]
[im 1/27]
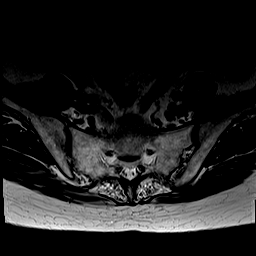
[im 5/27]
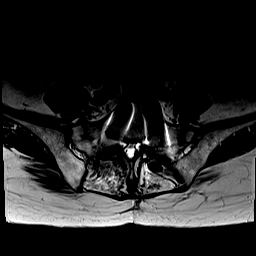
[im 8/27]
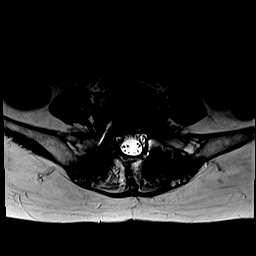
[im 12/27]
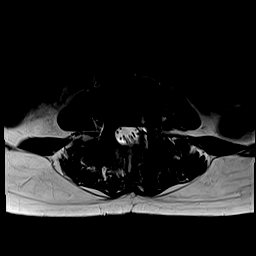
[im 15/27]
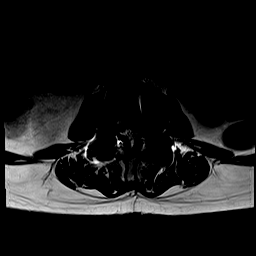
[im 19/27]
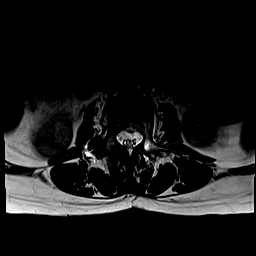
[im 22/27]
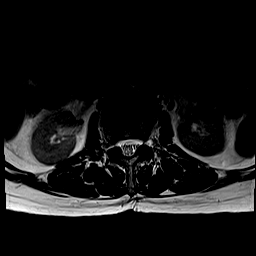
[im 24/27]
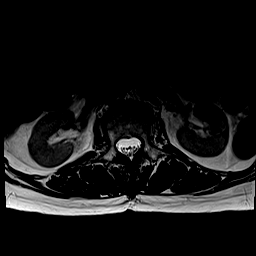
[im 27/27]
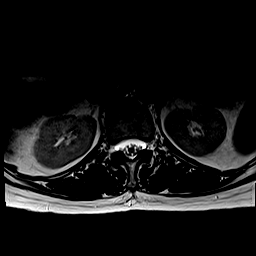

[Series 9: T1 · axial · 4.0mm · 0.39mm/px · z∈[-47,+112]mm · 9 of 27 slices shown (2 of 2)]
[im 1/27]
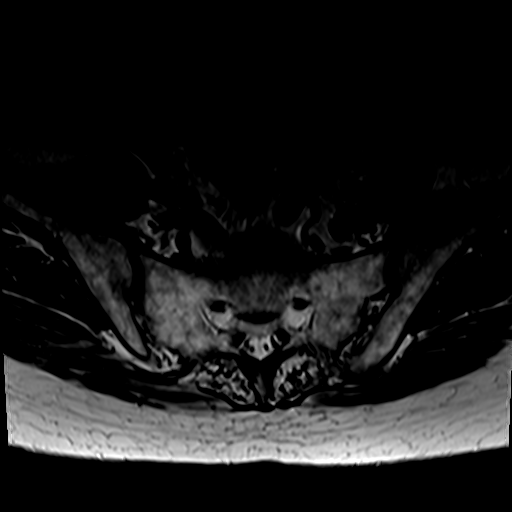
[im 5/27]
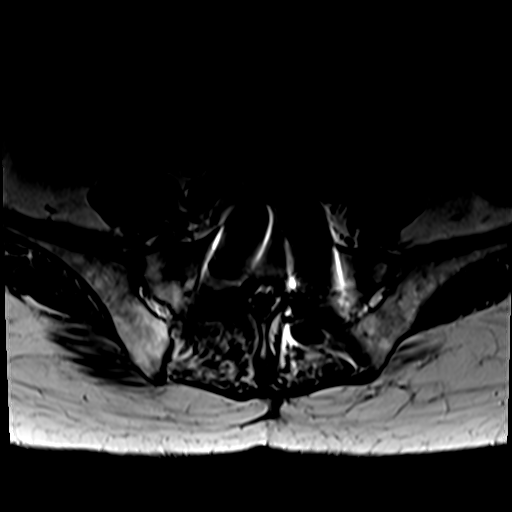
[im 8/27]
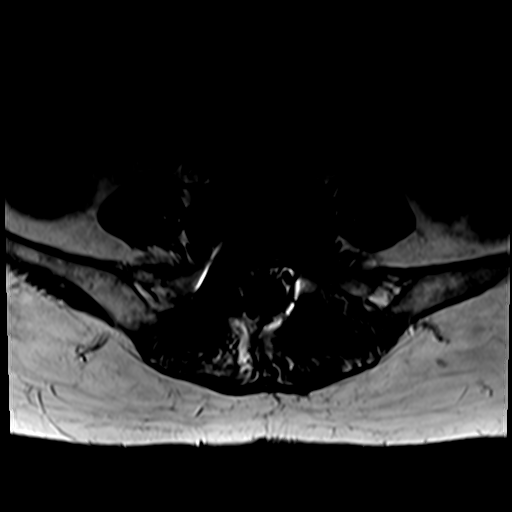
[im 12/27]
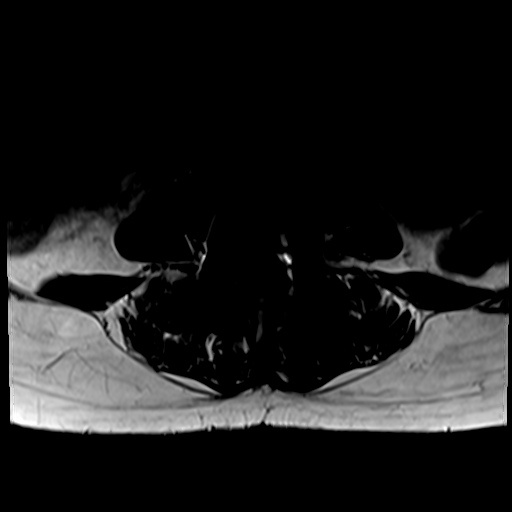
[im 15/27]
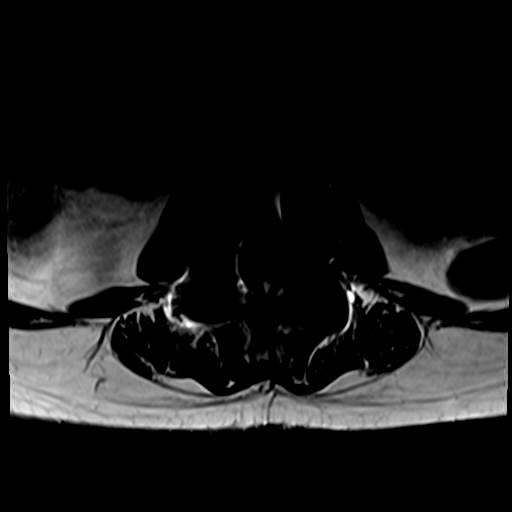
[im 19/27]
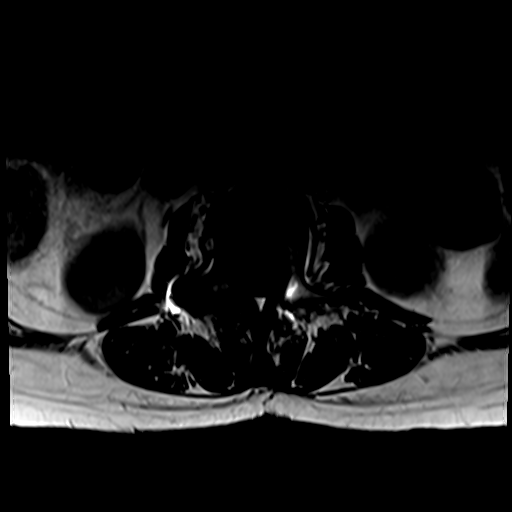
[im 22/27]
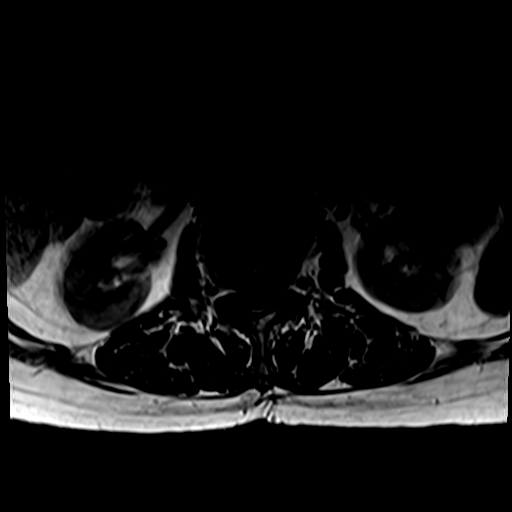
[im 24/27]
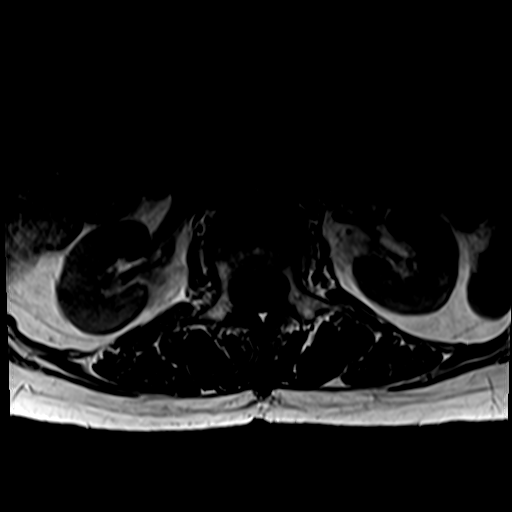
[im 27/27]
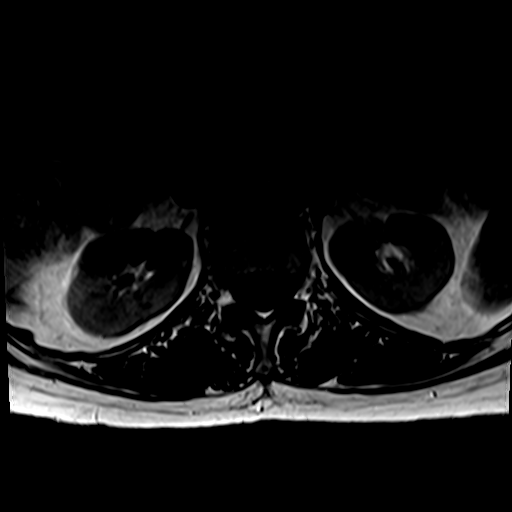

[34 of 48 positions shown; findings below may reference images not displayed]

FINDINGS: Segmentation:  Standard.

Alignment: Alignment is unchanged. Again seen is 0.2 cm
retrolisthesis L1 on L2, 0.3 cm retrolisthesis L2 on L3 and 0.5 cm
anterolisthesis L4 on L5.

Vertebrae: The patient is status post L3-5 fusion as seen on the
prior study. Remote superior endplate compression fracture of L4 is
unchanged. No acute fracture or worrisome lesion. Degenerative
endplate signal change L1-2 again seen.

Conus medullaris and cauda equina: Conus extends to the T12-L1
level. Conus and cauda equina appear normal.

Paraspinal and other soft tissues: Remote bilateral sacral fractures
are unchanged.

Disc levels:

T10-11 and T11-12 are imaged in the sagittal plane only. There is
disc bulging at both levels the central canal and foramina appear
open.

T12-L1: Shallow disc bulge and mild facet degenerative disease. No
stenosis.

L1-2: Loss of disc space height and a shallow bulge with endplate
spurring. Mild central canal stenosis and moderate to moderately
severe foraminal narrowing, worse on the left. Central canal
stenosis appears less severe than on the prior study.

L2-3: Shallow disc bulge. Mild central canal and mild to moderate
foraminal narrowing, worse on the left. No change.

L4-5: Status post laminectomy and fusion. The central canal is
widely patent. Mild bilateral foraminal narrowing. No change.

L4-5: Status post laminectomy and fusion. The central canal and left
foramen are widely patent. Moderately severe right foraminal
narrowing. No change.

L5-S1: Negative.
IMPRESSION: No new abnormality since the prior MRI.

Mild central canal stenosis at L1-2 appears improved compared to the
prior exam. Moderate to moderately severe bilateral foraminal
narrowing is worse on the left and unchanged.

Mild central canal and mild to moderate bilateral foraminal
narrowing at L2-3. Foraminal narrowing is worse on the left. No
change.

Status post L3-5 laminectomy and fusion. The central canal is widely
patent at both levels. Moderately severe right foraminal narrowing
is unchanged.

## 2020-07-11 DIAGNOSIS — G8929 Other chronic pain: Secondary | ICD-10-CM | POA: Diagnosis not present

## 2020-07-11 DIAGNOSIS — M5442 Lumbago with sciatica, left side: Secondary | ICD-10-CM | POA: Diagnosis not present

## 2020-07-11 DIAGNOSIS — M5441 Lumbago with sciatica, right side: Secondary | ICD-10-CM | POA: Diagnosis not present

## 2020-07-19 DIAGNOSIS — G8929 Other chronic pain: Secondary | ICD-10-CM | POA: Diagnosis not present

## 2020-07-19 DIAGNOSIS — M5441 Lumbago with sciatica, right side: Secondary | ICD-10-CM | POA: Diagnosis not present

## 2020-07-19 DIAGNOSIS — M6281 Muscle weakness (generalized): Secondary | ICD-10-CM | POA: Diagnosis not present

## 2020-07-19 DIAGNOSIS — M5442 Lumbago with sciatica, left side: Secondary | ICD-10-CM | POA: Diagnosis not present

## 2020-07-22 DIAGNOSIS — M48062 Spinal stenosis, lumbar region with neurogenic claudication: Secondary | ICD-10-CM | POA: Diagnosis not present

## 2020-07-22 DIAGNOSIS — M5136 Other intervertebral disc degeneration, lumbar region: Secondary | ICD-10-CM | POA: Diagnosis not present

## 2020-07-22 DIAGNOSIS — M5416 Radiculopathy, lumbar region: Secondary | ICD-10-CM | POA: Diagnosis not present

## 2020-07-26 DIAGNOSIS — M5441 Lumbago with sciatica, right side: Secondary | ICD-10-CM | POA: Diagnosis not present

## 2020-07-26 DIAGNOSIS — M5442 Lumbago with sciatica, left side: Secondary | ICD-10-CM | POA: Diagnosis not present

## 2020-07-26 DIAGNOSIS — G8929 Other chronic pain: Secondary | ICD-10-CM | POA: Diagnosis not present

## 2020-07-26 DIAGNOSIS — M6281 Muscle weakness (generalized): Secondary | ICD-10-CM | POA: Diagnosis not present

## 2020-08-01 DIAGNOSIS — G8929 Other chronic pain: Secondary | ICD-10-CM | POA: Diagnosis not present

## 2020-08-01 DIAGNOSIS — M5442 Lumbago with sciatica, left side: Secondary | ICD-10-CM | POA: Diagnosis not present

## 2020-08-01 DIAGNOSIS — M6281 Muscle weakness (generalized): Secondary | ICD-10-CM | POA: Diagnosis not present

## 2020-08-01 DIAGNOSIS — M5441 Lumbago with sciatica, right side: Secondary | ICD-10-CM | POA: Diagnosis not present

## 2020-08-08 DIAGNOSIS — M6281 Muscle weakness (generalized): Secondary | ICD-10-CM | POA: Diagnosis not present

## 2020-08-08 DIAGNOSIS — M5441 Lumbago with sciatica, right side: Secondary | ICD-10-CM | POA: Diagnosis not present

## 2020-08-08 DIAGNOSIS — G8929 Other chronic pain: Secondary | ICD-10-CM | POA: Diagnosis not present

## 2020-08-08 DIAGNOSIS — M5442 Lumbago with sciatica, left side: Secondary | ICD-10-CM | POA: Diagnosis not present

## 2020-10-10 DIAGNOSIS — H52209 Unspecified astigmatism, unspecified eye: Secondary | ICD-10-CM | POA: Diagnosis not present

## 2020-10-10 DIAGNOSIS — H5203 Hypermetropia, bilateral: Secondary | ICD-10-CM | POA: Diagnosis not present

## 2020-10-22 DIAGNOSIS — M21371 Foot drop, right foot: Secondary | ICD-10-CM | POA: Diagnosis not present

## 2020-10-24 DIAGNOSIS — M5136 Other intervertebral disc degeneration, lumbar region: Secondary | ICD-10-CM | POA: Diagnosis not present

## 2020-10-24 DIAGNOSIS — M48062 Spinal stenosis, lumbar region with neurogenic claudication: Secondary | ICD-10-CM | POA: Diagnosis not present

## 2020-10-24 DIAGNOSIS — M5416 Radiculopathy, lumbar region: Secondary | ICD-10-CM | POA: Diagnosis not present

## 2020-11-05 DIAGNOSIS — H26491 Other secondary cataract, right eye: Secondary | ICD-10-CM | POA: Diagnosis not present

## 2020-11-26 DIAGNOSIS — I10 Essential (primary) hypertension: Secondary | ICD-10-CM | POA: Diagnosis not present

## 2020-12-18 DIAGNOSIS — M48062 Spinal stenosis, lumbar region with neurogenic claudication: Secondary | ICD-10-CM | POA: Diagnosis not present

## 2020-12-18 DIAGNOSIS — M5136 Other intervertebral disc degeneration, lumbar region: Secondary | ICD-10-CM | POA: Diagnosis not present

## 2020-12-18 DIAGNOSIS — M5416 Radiculopathy, lumbar region: Secondary | ICD-10-CM | POA: Diagnosis not present

## 2020-12-19 DIAGNOSIS — F5105 Insomnia due to other mental disorder: Secondary | ICD-10-CM | POA: Diagnosis not present

## 2020-12-19 DIAGNOSIS — F4329 Adjustment disorder with other symptoms: Secondary | ICD-10-CM | POA: Diagnosis not present

## 2020-12-19 DIAGNOSIS — F99 Mental disorder, not otherwise specified: Secondary | ICD-10-CM | POA: Diagnosis not present

## 2020-12-19 DIAGNOSIS — M5136 Other intervertebral disc degeneration, lumbar region: Secondary | ICD-10-CM | POA: Diagnosis not present

## 2020-12-19 DIAGNOSIS — I10 Essential (primary) hypertension: Secondary | ICD-10-CM | POA: Diagnosis not present

## 2021-01-09 DIAGNOSIS — H52209 Unspecified astigmatism, unspecified eye: Secondary | ICD-10-CM | POA: Diagnosis not present

## 2021-01-09 DIAGNOSIS — H5203 Hypermetropia, bilateral: Secondary | ICD-10-CM | POA: Diagnosis not present

## 2021-01-09 DIAGNOSIS — H524 Presbyopia: Secondary | ICD-10-CM | POA: Diagnosis not present

## 2021-01-23 DIAGNOSIS — S2001XA Contusion of right breast, initial encounter: Secondary | ICD-10-CM | POA: Diagnosis not present

## 2021-01-23 DIAGNOSIS — F5105 Insomnia due to other mental disorder: Secondary | ICD-10-CM | POA: Diagnosis not present

## 2021-01-23 DIAGNOSIS — W01198A Fall on same level from slipping, tripping and stumbling with subsequent striking against other object, initial encounter: Secondary | ICD-10-CM | POA: Diagnosis not present

## 2021-01-23 DIAGNOSIS — F4329 Adjustment disorder with other symptoms: Secondary | ICD-10-CM | POA: Diagnosis not present

## 2021-01-23 DIAGNOSIS — F99 Mental disorder, not otherwise specified: Secondary | ICD-10-CM | POA: Diagnosis not present

## 2021-03-28 DIAGNOSIS — M48062 Spinal stenosis, lumbar region with neurogenic claudication: Secondary | ICD-10-CM | POA: Diagnosis not present

## 2021-03-28 DIAGNOSIS — M5136 Other intervertebral disc degeneration, lumbar region: Secondary | ICD-10-CM | POA: Diagnosis not present

## 2021-03-28 DIAGNOSIS — M5416 Radiculopathy, lumbar region: Secondary | ICD-10-CM | POA: Diagnosis not present

## 2021-05-05 DIAGNOSIS — K219 Gastro-esophageal reflux disease without esophagitis: Secondary | ICD-10-CM | POA: Diagnosis not present

## 2021-05-05 DIAGNOSIS — F99 Mental disorder, not otherwise specified: Secondary | ICD-10-CM | POA: Diagnosis not present

## 2021-05-05 DIAGNOSIS — M5136 Other intervertebral disc degeneration, lumbar region: Secondary | ICD-10-CM | POA: Diagnosis not present

## 2021-05-05 DIAGNOSIS — I10 Essential (primary) hypertension: Secondary | ICD-10-CM | POA: Diagnosis not present

## 2021-05-05 DIAGNOSIS — F5105 Insomnia due to other mental disorder: Secondary | ICD-10-CM | POA: Diagnosis not present

## 2021-05-05 DIAGNOSIS — F4321 Adjustment disorder with depressed mood: Secondary | ICD-10-CM | POA: Diagnosis not present

## 2021-05-05 DIAGNOSIS — F4329 Adjustment disorder with other symptoms: Secondary | ICD-10-CM | POA: Diagnosis not present

## 2021-05-05 DIAGNOSIS — E785 Hyperlipidemia, unspecified: Secondary | ICD-10-CM | POA: Diagnosis not present

## 2021-06-13 DIAGNOSIS — M7542 Impingement syndrome of left shoulder: Secondary | ICD-10-CM | POA: Diagnosis not present

## 2021-06-13 DIAGNOSIS — M7552 Bursitis of left shoulder: Secondary | ICD-10-CM | POA: Diagnosis not present

## 2021-06-13 DIAGNOSIS — M25512 Pain in left shoulder: Secondary | ICD-10-CM | POA: Diagnosis not present

## 2021-06-13 DIAGNOSIS — M778 Other enthesopathies, not elsewhere classified: Secondary | ICD-10-CM | POA: Diagnosis not present

## 2021-07-07 DIAGNOSIS — S20211A Contusion of right front wall of thorax, initial encounter: Secondary | ICD-10-CM | POA: Diagnosis not present

## 2021-07-07 DIAGNOSIS — W19XXXA Unspecified fall, initial encounter: Secondary | ICD-10-CM | POA: Diagnosis not present

## 2021-07-07 DIAGNOSIS — R079 Chest pain, unspecified: Secondary | ICD-10-CM | POA: Diagnosis not present

## 2021-07-14 ENCOUNTER — Other Ambulatory Visit: Payer: Self-pay | Admitting: Physician Assistant

## 2021-07-14 DIAGNOSIS — Z1231 Encounter for screening mammogram for malignant neoplasm of breast: Secondary | ICD-10-CM

## 2021-07-31 DIAGNOSIS — M48062 Spinal stenosis, lumbar region with neurogenic claudication: Secondary | ICD-10-CM | POA: Diagnosis not present

## 2021-07-31 DIAGNOSIS — M5416 Radiculopathy, lumbar region: Secondary | ICD-10-CM | POA: Diagnosis not present

## 2021-08-04 ENCOUNTER — Ambulatory Visit
Admission: RE | Admit: 2021-08-04 | Discharge: 2021-08-04 | Disposition: A | Discharge status: Home / Self Care | Payer: Medicare HMO | Source: Ambulatory Visit | Attending: Physician Assistant | Admitting: Physician Assistant

## 2021-08-04 ENCOUNTER — Other Ambulatory Visit: Payer: Self-pay

## 2021-08-04 DIAGNOSIS — Z1231 Encounter for screening mammogram for malignant neoplasm of breast: Secondary | ICD-10-CM | POA: Insufficient documentation

## 2021-08-06 DIAGNOSIS — M5136 Other intervertebral disc degeneration, lumbar region: Secondary | ICD-10-CM | POA: Diagnosis not present

## 2021-08-06 DIAGNOSIS — Z0000 Encounter for general adult medical examination without abnormal findings: Secondary | ICD-10-CM | POA: Diagnosis not present

## 2021-08-06 DIAGNOSIS — F4323 Adjustment disorder with mixed anxiety and depressed mood: Secondary | ICD-10-CM | POA: Diagnosis not present

## 2021-08-06 DIAGNOSIS — Z23 Encounter for immunization: Secondary | ICD-10-CM | POA: Diagnosis not present

## 2021-08-06 DIAGNOSIS — K649 Unspecified hemorrhoids: Secondary | ICD-10-CM | POA: Diagnosis not present

## 2021-08-13 ENCOUNTER — Other Ambulatory Visit: Payer: Self-pay | Admitting: Physician Assistant

## 2021-08-13 DIAGNOSIS — Z1231 Encounter for screening mammogram for malignant neoplasm of breast: Secondary | ICD-10-CM

## 2021-08-13 DIAGNOSIS — N6315 Unspecified lump in the right breast, overlapping quadrants: Secondary | ICD-10-CM

## 2021-09-22 ENCOUNTER — Other Ambulatory Visit: Payer: Self-pay | Admitting: Physician Assistant

## 2021-09-22 DIAGNOSIS — Z1231 Encounter for screening mammogram for malignant neoplasm of breast: Secondary | ICD-10-CM

## 2021-09-25 DIAGNOSIS — M5416 Radiculopathy, lumbar region: Secondary | ICD-10-CM | POA: Diagnosis not present

## 2021-09-25 DIAGNOSIS — I10 Essential (primary) hypertension: Secondary | ICD-10-CM | POA: Diagnosis not present

## 2021-09-25 DIAGNOSIS — Z23 Encounter for immunization: Secondary | ICD-10-CM | POA: Diagnosis not present

## 2021-09-25 DIAGNOSIS — M791 Myalgia, unspecified site: Secondary | ICD-10-CM | POA: Diagnosis not present

## 2021-10-14 ENCOUNTER — Other Ambulatory Visit: Payer: Self-pay

## 2021-10-14 ENCOUNTER — Ambulatory Visit
Admission: RE | Admit: 2021-10-14 | Discharge: 2021-10-14 | Disposition: A | Discharge status: Home / Self Care | Payer: Medicare HMO | Source: Ambulatory Visit | Attending: Physician Assistant | Admitting: Physician Assistant

## 2021-10-14 DIAGNOSIS — Z1231 Encounter for screening mammogram for malignant neoplasm of breast: Secondary | ICD-10-CM | POA: Insufficient documentation

## 2021-10-14 IMAGING — MG MM DIGITAL SCREENING BILAT W/ TOMO AND CAD
8 series · 9 of 24 positions shown · non-contrast
Comparison: Previous exam(s).

CLINICAL DATA: Screening.

EXAM:
DIGITAL SCREENING BILATERAL MAMMOGRAM WITH TOMOSYNTHESIS AND CAD
TECHNIQUE: Bilateral screening digital craniocaudal and mediolateral oblique
mammograms were obtained. Bilateral screening digital breast
tomosynthesis was performed. The images were evaluated with
computer-aided detection.

[R MLO synth-2D]
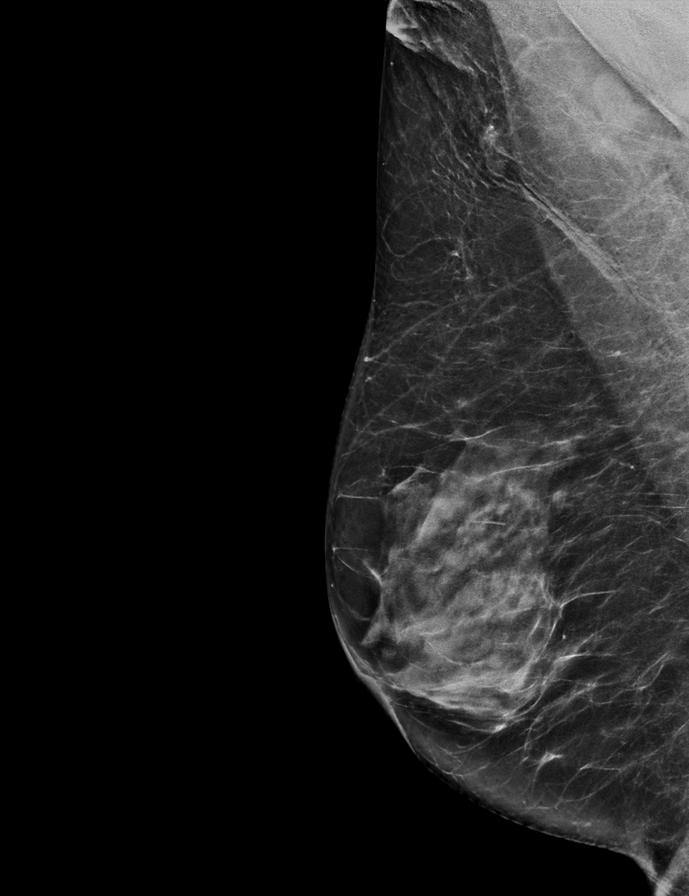

[L CC synth-2D]
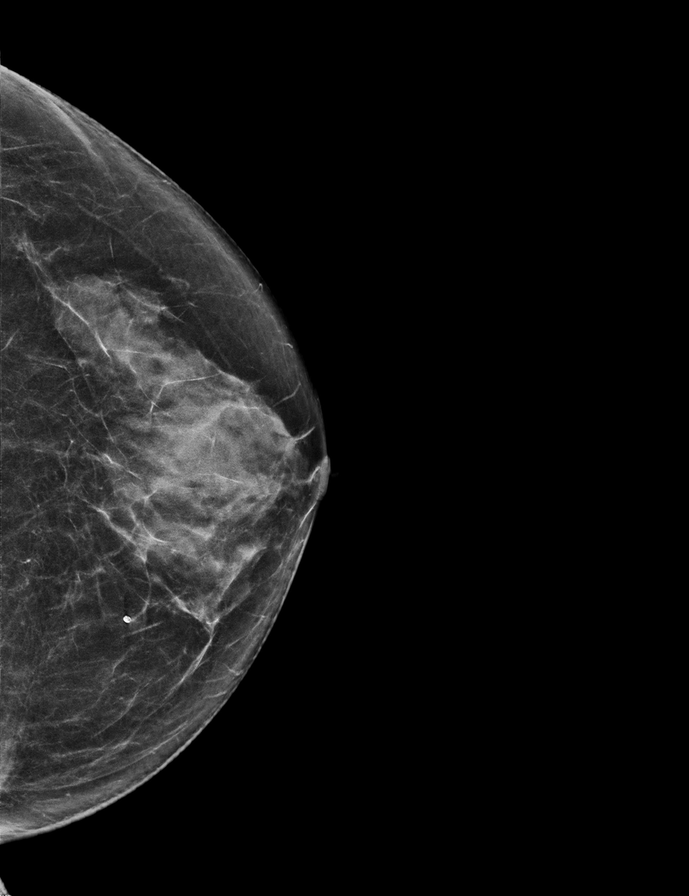

[R CC synth-2D]
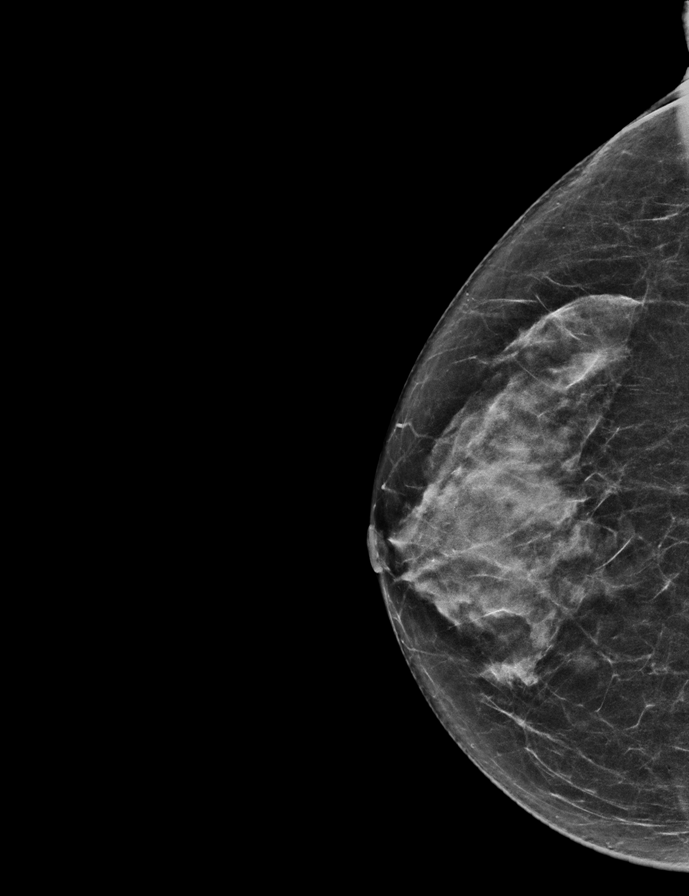

[L MLO synth-2D]
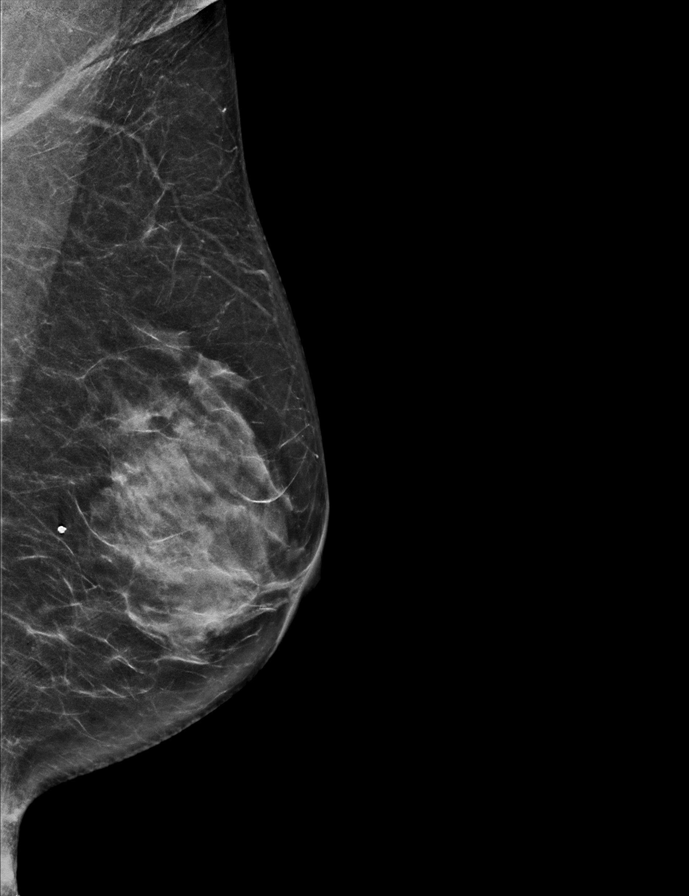

[R MLO tomo · 2 of 68 frames shown]
[frame 22/68]
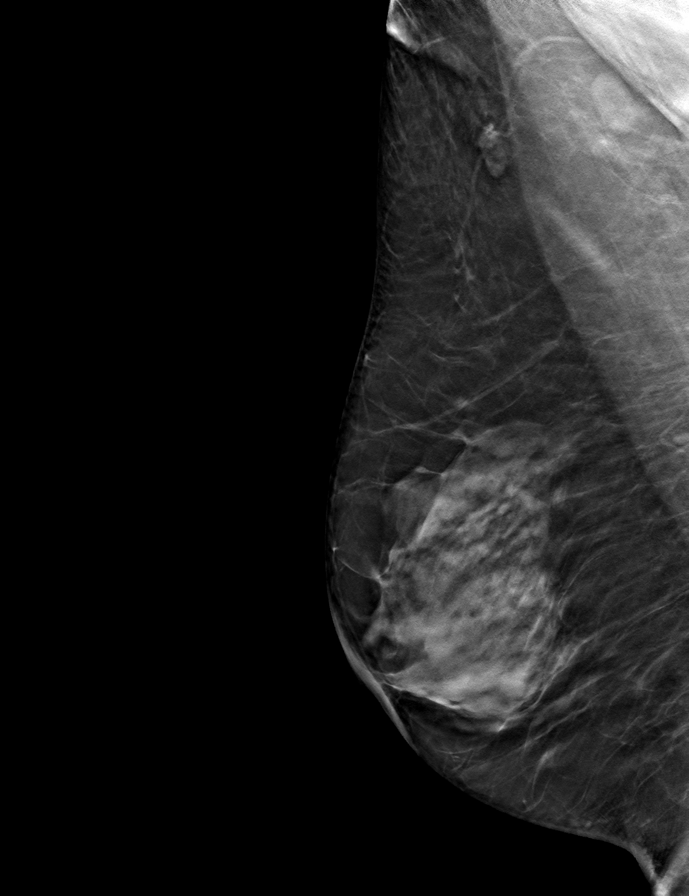
[frame 35/68]
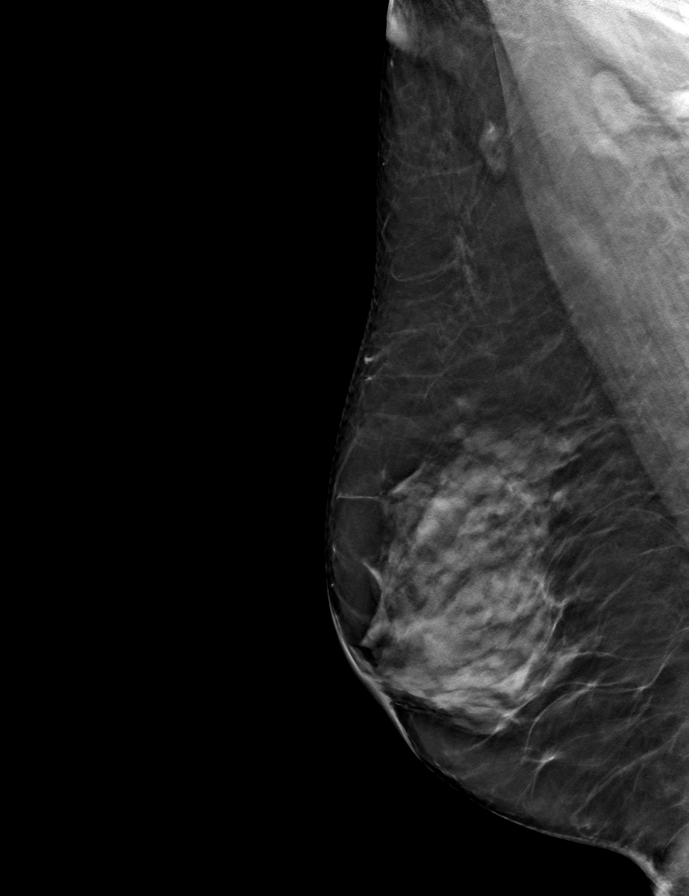

[L MLO tomo · tomo slice 33/64.0]
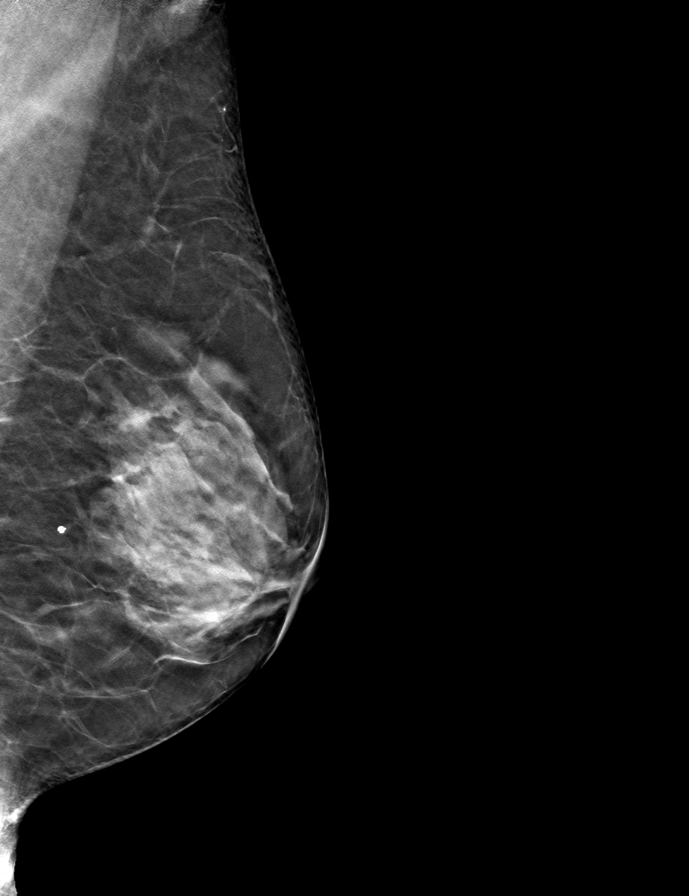

[L CC tomo · tomo slice 33/64.0]
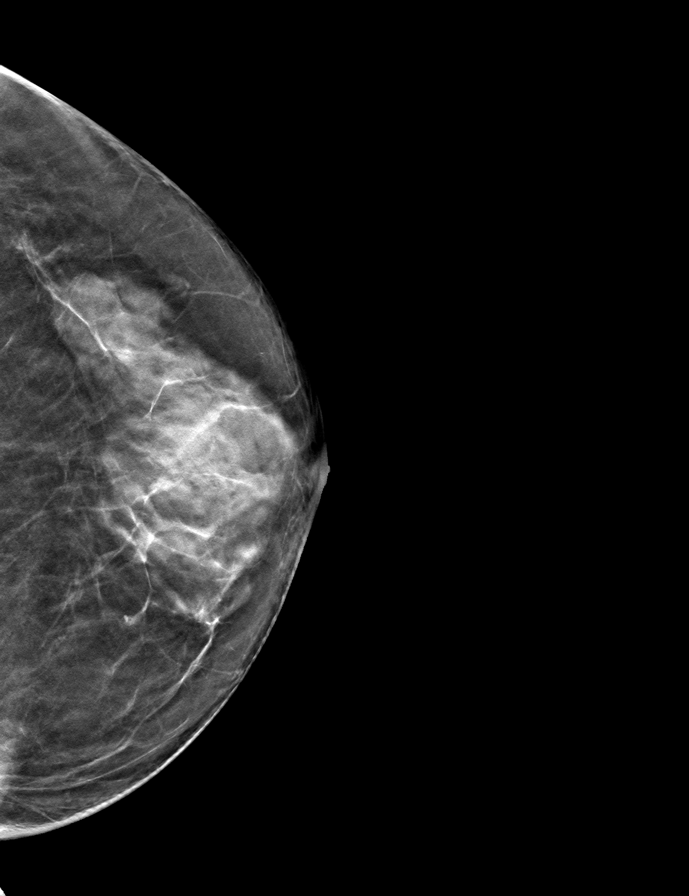

[R CC tomo · tomo slice 31/61.0]
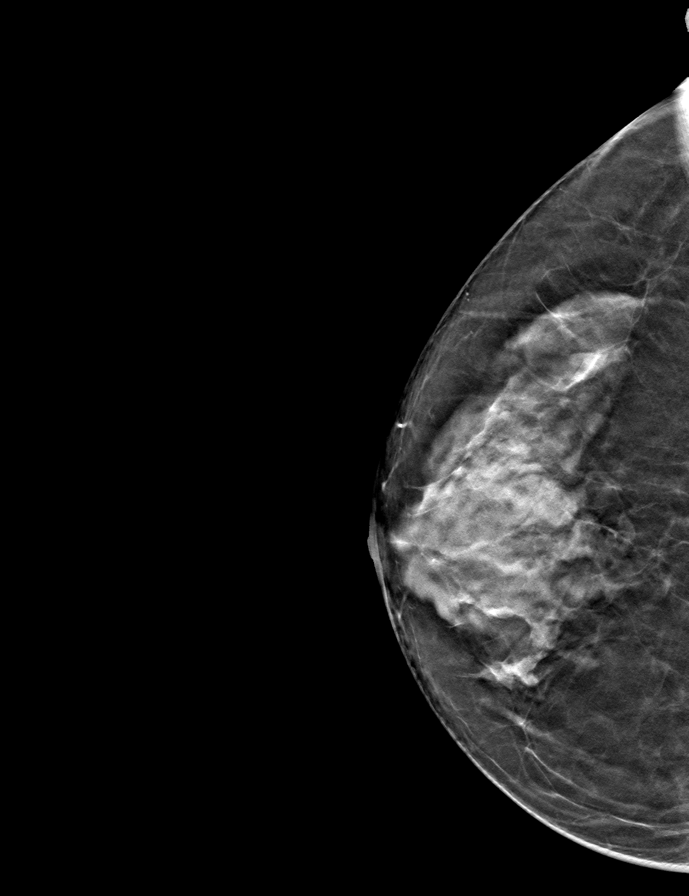

[9 of 24 positions shown; findings below may reference images not displayed]

ACR Breast Density Category c: The breast tissue is heterogeneously
dense, which may obscure small masses.
FINDINGS: There are no findings suspicious for malignancy.
IMPRESSION: No mammographic evidence of malignancy. A result letter of this
screening mammogram will be mailed directly to the patient.

RECOMMENDATION:
Screening mammogram in one year. (Code:[V2])

BI-RADS CATEGORY  1: Negative.

## 2021-12-04 DIAGNOSIS — M48062 Spinal stenosis, lumbar region with neurogenic claudication: Secondary | ICD-10-CM | POA: Diagnosis not present

## 2021-12-04 DIAGNOSIS — M5416 Radiculopathy, lumbar region: Secondary | ICD-10-CM | POA: Diagnosis not present

## 2021-12-22 DIAGNOSIS — F4323 Adjustment disorder with mixed anxiety and depressed mood: Secondary | ICD-10-CM | POA: Diagnosis not present

## 2021-12-22 DIAGNOSIS — I10 Essential (primary) hypertension: Secondary | ICD-10-CM | POA: Diagnosis not present

## 2022-08-31 ENCOUNTER — Other Ambulatory Visit: Payer: Self-pay | Admitting: Physician Assistant

## 2022-08-31 DIAGNOSIS — Z1231 Encounter for screening mammogram for malignant neoplasm of breast: Secondary | ICD-10-CM

## 2022-10-19 ENCOUNTER — Ambulatory Visit
Admission: RE | Admit: 2022-10-19 | Discharge: 2022-10-19 | Disposition: A | Discharge status: Home / Self Care | Payer: Medicare HMO | Source: Ambulatory Visit | Attending: Physician Assistant | Admitting: Physician Assistant

## 2022-10-19 DIAGNOSIS — Z1231 Encounter for screening mammogram for malignant neoplasm of breast: Secondary | ICD-10-CM | POA: Insufficient documentation

## 2023-10-08 ENCOUNTER — Other Ambulatory Visit: Payer: Self-pay | Admitting: Physician Assistant

## 2023-10-08 DIAGNOSIS — Z1231 Encounter for screening mammogram for malignant neoplasm of breast: Secondary | ICD-10-CM

## 2023-11-02 ENCOUNTER — Ambulatory Visit
Admission: RE | Admit: 2023-11-02 | Discharge: 2023-11-02 | Disposition: A | Discharge status: Home / Self Care | Payer: Medicare HMO | Source: Ambulatory Visit | Attending: Physician Assistant | Admitting: Physician Assistant

## 2023-11-02 DIAGNOSIS — Z1231 Encounter for screening mammogram for malignant neoplasm of breast: Secondary | ICD-10-CM | POA: Diagnosis present

## 2024-10-05 ENCOUNTER — Other Ambulatory Visit: Payer: Self-pay | Admitting: Physician Assistant

## 2024-10-05 DIAGNOSIS — Z1231 Encounter for screening mammogram for malignant neoplasm of breast: Secondary | ICD-10-CM

## 2024-10-25 ENCOUNTER — Other Ambulatory Visit: Payer: Self-pay | Admitting: Physician Assistant

## 2024-10-25 DIAGNOSIS — Z780 Asymptomatic menopausal state: Secondary | ICD-10-CM

## 2024-11-09 ENCOUNTER — Other Ambulatory Visit: Payer: Self-pay | Admitting: Physician Assistant

## 2024-11-09 DIAGNOSIS — R410 Disorientation, unspecified: Secondary | ICD-10-CM

## 2024-11-13 ENCOUNTER — Ambulatory Visit
Admission: RE | Admit: 2024-11-13 | Discharge: 2024-11-13 | Disposition: A | Discharge status: Home / Self Care | Source: Ambulatory Visit | Attending: Physician Assistant | Admitting: Physician Assistant

## 2024-11-13 DIAGNOSIS — Z1231 Encounter for screening mammogram for malignant neoplasm of breast: Secondary | ICD-10-CM | POA: Insufficient documentation

## 2024-11-24 ENCOUNTER — Inpatient Hospital Stay
Admission: RE | Admit: 2024-11-24 | Discharge: 2024-12-08 | DRG: 885 | Disposition: A | Discharge status: Home / Self Care | Source: Intra-hospital | Attending: Psychiatry | Admitting: Psychiatry

## 2024-11-24 ENCOUNTER — Emergency Department
Admission: EM | Admit: 2024-11-24 | Discharge: 2024-11-24 | Disposition: A | Discharge status: Other Acute Inpatient Hospital | Attending: Emergency Medicine | Admitting: Emergency Medicine

## 2024-11-24 ENCOUNTER — Other Ambulatory Visit: Payer: Self-pay

## 2024-11-24 DIAGNOSIS — Z79899 Other long term (current) drug therapy: Secondary | ICD-10-CM | POA: Diagnosis not present

## 2024-11-24 DIAGNOSIS — Z90710 Acquired absence of both cervix and uterus: Secondary | ICD-10-CM

## 2024-11-24 DIAGNOSIS — F03918 Unspecified dementia, unspecified severity, with other behavioral disturbance: Secondary | ICD-10-CM | POA: Diagnosis present

## 2024-11-24 DIAGNOSIS — F333 Major depressive disorder, recurrent, severe with psychotic symptoms: Principal | ICD-10-CM | POA: Diagnosis present

## 2024-11-24 DIAGNOSIS — F0393 Unspecified dementia, unspecified severity, with mood disturbance: Secondary | ICD-10-CM | POA: Diagnosis present

## 2024-11-24 DIAGNOSIS — I10 Essential (primary) hypertension: Secondary | ICD-10-CM | POA: Diagnosis present

## 2024-11-24 DIAGNOSIS — F29 Unspecified psychosis not due to a substance or known physiological condition: Principal | ICD-10-CM | POA: Diagnosis present

## 2024-11-24 DIAGNOSIS — R440 Auditory hallucinations: Secondary | ICD-10-CM

## 2024-11-24 DIAGNOSIS — F23 Brief psychotic disorder: Secondary | ICD-10-CM | POA: Diagnosis not present

## 2024-11-24 DIAGNOSIS — R45850 Homicidal ideations: Secondary | ICD-10-CM | POA: Insufficient documentation

## 2024-11-24 DIAGNOSIS — R7989 Other specified abnormal findings of blood chemistry: Secondary | ICD-10-CM | POA: Insufficient documentation

## 2024-11-24 DIAGNOSIS — F0392 Unspecified dementia, unspecified severity, with psychotic disturbance: Secondary | ICD-10-CM | POA: Diagnosis present

## 2024-11-24 LAB — CBC
HCT: 42.3 % (ref 36.0–46.0)
Hemoglobin: 13.4 g/dL (ref 12.0–15.0)
MCH: 29.1 pg (ref 26.0–34.0)
MCHC: 31.7 g/dL (ref 30.0–36.0)
MCV: 92 fL (ref 80.0–100.0)
Platelets: 150 K/uL (ref 150–400)
RBC: 4.6 MIL/uL (ref 3.87–5.11)
RDW: 15.3 % (ref 11.5–15.5)
WBC: 4.4 K/uL (ref 4.0–10.5)
nRBC: 0 % (ref 0.0–0.2)

## 2024-11-24 LAB — COMPREHENSIVE METABOLIC PANEL WITH GFR
ALT: 45 U/L — ABNORMAL HIGH (ref 0–44)
AST: 57 U/L — ABNORMAL HIGH (ref 15–41)
Albumin: 4.8 g/dL (ref 3.5–5.0)
Alkaline Phosphatase: 140 U/L — ABNORMAL HIGH (ref 38–126)
Anion gap: 11 (ref 5–15)
BUN: 20 mg/dL (ref 8–23)
CO2: 27 mmol/L (ref 22–32)
Calcium: 10.2 mg/dL (ref 8.9–10.3)
Chloride: 104 mmol/L (ref 98–111)
Creatinine, Ser: 0.86 mg/dL (ref 0.44–1.00)
GFR, Estimated: >60 mL/min
Glucose, Bld: 101 mg/dL — ABNORMAL HIGH (ref 70–99)
Potassium: 3.6 mmol/L (ref 3.5–5.1)
Sodium: 142 mmol/L (ref 135–145)
Total Bilirubin: 0.5 mg/dL (ref 0.0–1.2)
Total Protein: 8.2 g/dL — ABNORMAL HIGH (ref 6.5–8.1)

## 2024-11-24 LAB — URINALYSIS, W/ REFLEX TO CULTURE (INFECTION SUSPECTED)
Bacteria, UA: NONE SEEN
Bilirubin Urine: NEGATIVE
Glucose, UA: NEGATIVE mg/dL
Hgb urine dipstick: NEGATIVE
Ketones, ur: NEGATIVE mg/dL
Leukocytes,Ua: NEGATIVE
Nitrite: NEGATIVE
Protein, ur: 30 mg/dL — AB
Specific Gravity, Urine: 1.008 (ref 1.005–1.030)
Squamous Epithelial / HPF: 0 /HPF (ref 0–5)
pH: 6 (ref 5.0–8.0)

## 2024-11-24 LAB — URINE DRUG SCREEN
Amphetamines: NEGATIVE
Barbiturates: NEGATIVE
Benzodiazepines: NEGATIVE
Cocaine: NEGATIVE
Fentanyl: NEGATIVE
Methadone Scn, Ur: NEGATIVE
Opiates: NEGATIVE
Tetrahydrocannabinol: NEGATIVE

## 2024-11-24 LAB — ETHANOL: Alcohol, Ethyl (B): <15 mg/dL

## 2024-11-24 LAB — RESP PANEL BY RT-PCR (RSV, FLU A&B, COVID)  RVPGX2
Influenza A by PCR: NEGATIVE
Influenza B by PCR: NEGATIVE
Resp Syncytial Virus by PCR: NEGATIVE
SARS Coronavirus 2 by RT PCR: NEGATIVE

## 2024-11-24 MED ORDER — OLANZAPINE 5 MG PO TBDP
5.0000 mg | ORAL_TABLET | Freq: Three times a day (TID) | ORAL | Status: DC | PRN
  Administered 2024-11-25 (×2): 5 mg via ORAL
  Filled 2024-11-24 (×2): qty 1

## 2024-11-24 MED ORDER — TRAZODONE HCL 50 MG PO TABS
50.0000 mg | ORAL_TABLET | Freq: Every evening | ORAL | Status: DC | PRN
  Administered 2024-11-24 – 2024-12-07 (×13): 50 mg via ORAL
  Filled 2024-11-24 (×13): qty 1

## 2024-11-24 MED ORDER — ACETAMINOPHEN 325 MG PO TABS
650.0000 mg | ORAL_TABLET | Freq: Four times a day (QID) | ORAL | Status: DC | PRN
  Administered 2024-11-24: 650 mg via ORAL
  Filled 2024-11-24 (×2): qty 2

## 2024-11-24 MED ORDER — ALUM & MAG HYDROXIDE-SIMETH 200-200-20 MG/5ML PO SUSP
30.0000 mL | ORAL | Status: DC | PRN
  Administered 2024-11-29 – 2024-12-04 (×2): 30 mL via ORAL
  Filled 2024-11-24 (×2): qty 30

## 2024-11-24 MED ORDER — MAGNESIUM HYDROXIDE 400 MG/5ML PO SUSP: 30.0000 mL | Freq: Every day | ORAL | Status: DC | PRN

## 2024-11-24 NOTE — ED Notes (Signed)
 VOL tts consult pending psych consult

## 2024-11-24 NOTE — ED Notes (Signed)
 [name] - Sister home phone number [phone]

## 2024-11-24 NOTE — ED Notes (Signed)
Report given to receiving RN Gerald Stabs.

## 2024-11-24 NOTE — ED Notes (Signed)
 Dinner tray provided to pt

## 2024-11-24 NOTE — ED Notes (Addendum)
 Pt up to restroom with steady gait. Appears alert and calm. Asking for toilet paper from staff.

## 2024-11-24 NOTE — BH Assessment (Signed)
 Comprehensive Clinical Assessment (CCA) Note  11/24/2024 Darrall Kipper [id number]  Chief Complaint:  Chief Complaint  Patient presents with   Psychiatric Evaluation   Visit Diagnosis: Psychosis   Sarah Sullivan is a 79 year old female who presents to the ER due AV/H. Per the patient and the patient's sister Jethro [phone]) states the voices are tying to kill her and saying bad things about here. This has taken place for approximately six weeks, and the last week have worsened. The patient sister states she stayed with her last night due to the fear she was having and she only slept for ten minutes. The patient kept getting up, looking out the window because the voices were saying they were coming in the house. During the interview, the patient was polite and pleasant. She spoke above a whisper because the voices were listening and watching. Patient denies SI/HI.  CCA Screening, Triage and Referral (STR)  Patient Reported Information How did you hear about us ? Family/Friend  What Is the Reason for Your Visit/Call Today? Having AV/H  How Long Has This Been Causing You Problems? 1 wk - 1 month  What Do You Feel Would Help You the Most Today? Treatment for Depression or other mood problem   Have You Recently Had Any Thoughts About Hurting Yourself? No  Are You Planning to Commit Suicide/Harm Yourself At This time? No   Flowsheet Row ED from 11/24/2024 in [facility] Emergency Department at [facility]  C-SSRS RISK CATEGORY No Risk    Have you Recently Had Thoughts About Hurting Someone Sherral? No  Are You Planning to Harm Someone at This Time? No  Explanation: No data recorded  Have You Used Any Alcohol or Drugs in the Past 24 Hours? No  How Long Ago Did You Use Drugs or Alcohol? No data recorded What Did You Use and How Much? No data recorded  Do You Currently Have a Therapist/Psychiatrist? No  Name of Therapist/Psychiatrist:    Have You Been Recently  Discharged From Any Office Practice or Programs? No  Explanation of Discharge From Practice/Program: No data recorded    CCA Screening Triage Referral Assessment Type of Contact: Face-to-Face  Telemedicine Service Delivery:   Is this Initial or Reassessment?   Date Telepsych consult ordered in [facility]:    Time Telepsych consult ordered in [facility]:    Location of Assessment: [facility] ED  Provider Location: [facility] ED   Collateral Involvement: Spoke with sister   Does Patient Have a Automotive Engineer Guardian? No  Legal Guardian Contact Information: No data recorded Copy of Legal Guardianship Form: No data recorded Legal Guardian Notified of Arrival: No data recorded Legal Guardian Notified of Pending Discharge: No data recorded If Minor and Not Living with Parent(s), Who has Custody? No data recorded Is CPS involved or ever been involved? Never  Is APS involved or ever been involved? Never   Patient Determined To Be At Risk for Harm To Self or Others Based on Review of Patient Reported Information or Presenting Complaint? No  Method: No data recorded Availability of Means: No data recorded Intent: No data recorded Notification Required: No data recorded Additional Information for Danger to Others Potential: No data recorded Additional Comments for Danger to Others Potential: No data recorded Are There Guns or Other Weapons in Your Home? No data recorded Types of Guns/Weapons: No data recorded Are These Weapons Safely Secured?  No data recorded Who Could Verify You Are Able To Have These Secured: No data recorded Do You Have any Outstanding Charges, Pending Court Dates, Parole/Probation? No data recorded Contacted To Inform of Risk of Harm To Self or Others: No data recorded   Does Patient Present under Involuntary Commitment? No   [place] of Residence: [place]   Patient Currently Receiving the Following Services: Not Receiving  Services   Determination of Need: Emergent (2 hours)   Options For Referral: ED Visit    CCA Biopsychosocial Patient Reported Schizophrenia/Schizoaffective Diagnosis in Past: No   Strengths: Have stable housing, have support system and polite.   Mental Health Symptoms Depression:  Change in energy/activity; Hopelessness; Sleep (too much or little)   Duration of Depressive symptoms: Duration of Depressive Symptoms: Greater than two weeks   Mania:  Change in energy/activity   Anxiety:   Restlessness   Psychosis:  Hallucinations   Duration of Psychotic symptoms: Duration of Psychotic Symptoms: Less than six months   Trauma:  N/A   Obsessions:  N/A   Compulsions:  N/A   Inattention:  N/A   Hyperactivity/Impulsivity:  N/A   Oppositional/Defiant Behaviors:  N/A   Emotional Irregularity:  N/A   Other Mood/Personality Symptoms:  No data recorded   Mental Status Exam Appearance and self-care  Stature:  Average   Weight:  Average weight   Clothing:  Neat/clean; Age-appropriate   Grooming:  Normal   Cosmetic use:  None   Posture/gait:  Normal   Motor activity:  -- (Within normal range)   Sensorium  Attention:  Normal   Concentration:  Normal   Orientation:  X5   Recall/memory:  Normal   Affect and Mood  Affect:  Appropriate   Mood:  Anxious; Depressed   Relating  Eye contact:  Normal   Facial expression:  Depressed   Attitude toward examiner:  Cooperative   Thought and Language  Speech flow: Clear and Coherent   Thought content:  Appropriate to Mood and Circumstances   Preoccupation:  None   Hallucinations:  Auditory; Visual   Organization:  Therapist, Nutritional of Knowledge:  Average   Intelligence:  Average   Abstraction:  Functional; Normal   Judgement:  Poor; Fair   Reality Testing:  Realistic   Insight:  Poor   Decision Making:  Impulsive   Social Functioning  Social Maturity:  Responsible   Social  Judgement:  Normal   Stress  Stressors:  Other (Comment)   Coping Ability:  Normal   Skill Deficits:  None   Supports:  Family     Religion:    Leisure/Recreation: Leisure / Recreation Do You Have Hobbies?: No  Exercise/Diet: Exercise/Diet Have You Gained or Lost A Significant Amount of Weight in the Past Six Months?: No Do You Have Any Trouble Sleeping?: Yes Explanation of Sleeping Difficulties: Due to the AV/H   CCA Employment/Education Employment/Work Situation: Employment / Work Situation Employment Situation: Retired Passenger Transport Manager has Been Impacted by Current Illness: No Has Patient ever Been in Equities Trader?: No  Education: Education Is Patient Currently Attending School?: No Did You Have An Individualized Education Program (IIEP): No Did You Have Any Difficulty At [employer]?: No Patient's Education Has Been Impacted by Current Illness: No   CCA Family/Childhood History Family and Relationship History: Family history Marital status: Single Does patient have children?: No  Childhood History:  Childhood History By whom was/is the patient raised?: Mother Did patient suffer any verbal/emotional/physical/sexual abuse as  a child?: No Did patient suffer from severe childhood neglect?: No Has patient ever been sexually abused/assaulted/raped as an adolescent or adult?: No Was the patient ever a victim of a crime or a disaster?: No Witnessed domestic violence?: No Has patient been affected by domestic violence as an adult?: No   CCA Substance Use Alcohol/Drug Use: Alcohol / Drug Use Pain Medications: See MAR Prescriptions: See MAR Over the Counter: See MAR History of alcohol / drug use?: No history of alcohol / drug abuse Longest period of sobriety (when/how long): n/a    ASAM's:  Six Dimensions of Multidimensional Assessment  Dimension 1:  Acute Intoxication and/or Withdrawal Potential:      Dimension 2:  Biomedical Conditions and Complications:       Dimension 3:  Emotional, Behavioral, or Cognitive Conditions and Complications:     Dimension 4:  Readiness to Change:     Dimension 5:  Relapse, Continued use, or Continued Problem Potential:     Dimension 6:  Recovery/Living Environment:     ASAM Severity Score:    ASAM Recommended Level of Treatment:     Substance use Disorder (SUD)    Recommendations for Services/Supports/Treatments:    Disposition Recommendation per psychiatric provider: Pending Psych Consult   DSM5 Diagnoses: Patient Active Problem List   Diagnosis Date Noted   Anemia    Anemia due to chronic blood loss    Abnormal LFTs    Dehydration    Thrombocytopenia    Acute renal failure    Colitis 04/16/2019   Hypokalemia 04/14/2019    Referrals to Alternative Service(s): Referred to Alternative Service(s):   Place:   Date:   Time:    Referred to Alternative Service(s):   Place:   Date:   Time:    Referred to Alternative Service(s):   Place:   Date:   Time:    Referred to Alternative Service(s):   Place:   Date:   Time:     Kiki DOROTHA Barge MS, LCAS, [facility], [facility] Therapeutic Triage Specialist 11/24/2024 2:40 PM

## 2024-11-24 NOTE — ED Notes (Signed)
 Report given to [facility]. Transport pending admit orders. All questions answered at this time.

## 2024-11-24 NOTE — ED Provider Notes (Addendum)
 "  [facility] Provider Note    Event Date/Time   First MD Initiated Contact with Patient 11/24/24 1141     (approximate)   History   Psychiatric Evaluation   HPI  Sarah Sullivan is a 79 y.o. female past medical history significant for depression, mood disturbance, presents to the emergency department for hallucinations.  Notes that she is here because every time she talks her husband can hear her and that he is evil.  States that she lives at home by herself with her 2 dogs.  States that she is only here because whenever she talks she is hearing voices that are evil.  Denies any suicidal ideation.  Does endorse some thoughts of hurting others with Dr. Perri who is her clinic doctor.  States that she is here because her sister-in-law had her come in.  [name]  - sister in law [phone]   Called and talked to her sister-in-law.  States that she has had progressively worsening auditory hallucinations that have been worsening over the past 6 weeks but significantly worsened over the past 2 weeks.  States that she has had severe paranoia and is not leaving the house because she thinks that people are going to kill her.  States that she has to whisper because she has constant auditory hallucinations and feeling like people are going to get out to get her.  States that she was confused today and thought that her dogs got out.  States that she normally lives by herself she does not feel that she is safe at home.  Does not take any medications other than trazodone and believes that was recently switch but uncertain of what it was switched to.  States that she was up every 15 minutes last night looking at the window trying to see who is coming to get her.       Physical Exam   Triage Vital Signs: ED Triage Vitals  Encounter Vitals Group     BP 11/24/24 1123 (!) 175/77     Girls Systolic BP Percentile --      Girls Diastolic BP Percentile --      Boys Systolic  BP Percentile --      Boys Diastolic BP Percentile --      Pulse Rate 11/24/24 1123 86     Resp 11/24/24 1123 16     Temp 11/24/24 1123 98 F (36.7 C)     Temp Source 11/24/24 1123 Oral     SpO2 11/24/24 1123 100 %     Weight 11/24/24 1127 115 lb 1.3 oz (52.2 kg)     Height 11/24/24 1127 4' 10 (1.473 m)     Head Circumference --      Peak Flow --      Pain Score 11/24/24 1124 0     Pain Loc --      Pain Education --      Exclude from Growth Chart --     Most recent vital signs: Vitals:   11/24/24 1123  BP: (!) 175/77  Pulse: 86  Resp: 16  Temp: 98 F (36.7 C)  SpO2: 100%    Physical Exam Constitutional:      Appearance: She is well-developed.  HENT:     Head: Atraumatic.  Eyes:     Conjunctiva/sclera: Conjunctivae normal.  Cardiovascular:     Rate and Rhythm: Regular rhythm.  Pulmonary:     Effort: No respiratory distress.  Abdominal:  General: There is no distension.  Musculoskeletal:        General: Normal range of motion.     Cervical back: Normal range of motion.  Skin:    General: Skin is warm.  Neurological:     Mental Status: She is alert. Mental status is at baseline.  Psychiatric:        Attention and Perception: She perceives auditory hallucinations.        Mood and Affect: Mood is depressed.        Behavior: Behavior is cooperative.        Thought Content: Thought content does not include homicidal or suicidal ideation. Thought content does not include homicidal or suicidal plan.        Judgment: Judgment is impulsive.     Comments: Asking for you to whisper into her ear because otherwise the evil voices can hear her     IMPRESSION / MDM / ASSESSMENT AND PLAN / ED COURSE  I reviewed the triage vital signs and the nursing notes.  Differential diagnosis including acute psychosis, auditory hallucinations, electrolyte abnormality, urinary tract infection    LABS (all labs ordered are listed, but only abnormal results are displayed) Labs  interpreted as -    Labs Reviewed  COMPREHENSIVE METABOLIC PANEL WITH GFR - Abnormal; Notable for the following components:      Result Value   Glucose, Bld 101 (*)    Total Protein 8.2 (*)    AST 57 (*)    ALT 45 (*)    Alkaline Phosphatase 140 (*)    All other components within normal limits  URINALYSIS, W/ REFLEX TO CULTURE (INFECTION SUSPECTED) - Abnormal; Notable for the following components:   Color, Urine YELLOW (*)    APPearance CLEAR (*)    Protein, ur 30 (*)    All other components within normal limits  ETHANOL  CBC  URINE DRUG SCREEN    MDM  Patient presents to the emergency department with auditory hallucinations and severe paranoid behavior with passive homicidal ideation to an outpatient physician.  History is provided by the patient and her sister-in-law over the phone.  Lab work with no significant leukocytosis or anemia no significant electrolyte abnormality.  Urine is currently pending.  Consulted psychiatry for further evaluation with concern for acute psychosis, auditory hallucinations and paranoid behavior  Clinical Course as of 11/24/24 1400  Fri Nov 24, 2024  1359 No signs of urinary tract infection.  UDS is negative.  Mild elevation of LFTs.  Glucose 101.  Patient medically cleared.  Waiting for evaluation by psychiatry. [SM]    Clinical Course User Index [SM] Suzanne Kirsch, MD     The patient has been placed in psychiatric observation due to the need to provide a safe environment for the patient while obtaining psychiatric consultation and evaluation, as well as ongoing medical and medication management to treat the patient's condition.  The patient has not been placed under full IVC at this time.   PROCEDURES:  Critical Care performed: No  Procedures  Patient's presentation is most consistent with acute presentation with potential threat to life or bodily function.   MEDICATIONS ORDERED IN ED: Medications - No data to display  FINAL  CLINICAL IMPRESSION(S) / ED DIAGNOSES   Final diagnoses:  Auditory hallucinations     Rx / DC Orders   ED Discharge Orders     None        Note:  This document was prepared using Dragon voice recognition  software and may include unintentional dictation errors.   Suzanne Kirsch, MD 11/24/24 1314    Suzanne Kirsch, MD 11/24/24 1400  "

## 2024-11-24 NOTE — ED Notes (Signed)
 Pt returned to exam room from restroom with steady gait. No distress noted at this time. Room darkened to enhance rest.

## 2024-11-24 NOTE — ED Notes (Signed)
 Pt lying on stretcher with television on.  Italian ice provided with a cup of ice water.

## 2024-11-24 NOTE — ED Notes (Signed)
 Pharmacy tech asked to complete med rec at this time. Pt states she has a list of meds with her belongings. Pt assisted with searching belongings when pt retrieved a napkin filled with an assortment of meds. Napkin and contents given to pharmacy tech.

## 2024-11-24 NOTE — ED Notes (Signed)
 Lunch tray provided to pt.

## 2024-11-24 NOTE — ED Notes (Signed)
 Vol patient accepted to geropsych

## 2024-11-24 NOTE — ED Triage Notes (Signed)
 Pt to ED with sister for c/o hearing voices and wandering. Per sister, pt is hearing voices that are saying they are going to kill her. Pt has hx of depression and anxiety, compliant with medications. Pt A&O, NAD. Denies SI.

## 2024-11-24 NOTE — Plan of Care (Signed)
?  Problem: Education: ?Goal: Ability to state activities that reduce stress will improve ?Outcome: Progressing ?  ?Problem: Coping: ?Goal: Ability to identify and develop effective coping behavior will improve ?Outcome: Progressing ?  ?Problem: Self-Concept: ?Goal: Ability to identify factors that promote anxiety will improve ?Outcome: Progressing ?Goal: Level of anxiety will decrease ?Outcome: Progressing ?Goal: Ability to modify response to factors that promote anxiety will improve ?Outcome: Progressing ?  ?Problem: Education: ?Goal: Utilization of techniques to improve thought processes will improve ?Outcome: Progressing ?Goal: Knowledge of the prescribed therapeutic regimen will improve ?Outcome: Progressing ?  ?Problem: Activity: ?Goal: Interest or engagement in leisure activities will improve ?Outcome: Progressing ?Goal: Imbalance in normal sleep/wake cycle will improve ?Outcome: Progressing ?  ?

## 2024-11-24 NOTE — ED Notes (Signed)
 EKG reading STEMI. No STEMI called per MD Floy. Automated reading related to artifact on image.

## 2024-11-25 ENCOUNTER — Encounter: Payer: Self-pay | Admitting: Psychiatry

## 2024-11-25 DIAGNOSIS — F29 Unspecified psychosis not due to a substance or known physiological condition: Secondary | ICD-10-CM

## 2024-11-25 MED ORDER — ATORVASTATIN CALCIUM 80 MG PO TABS
80.0000 mg | ORAL_TABLET | Freq: Every day | ORAL | Status: DC
  Administered 2024-11-25 – 2024-12-08 (×14): 80 mg via ORAL
  Filled 2024-11-25 (×14): qty 1

## 2024-11-25 MED ORDER — IRBESARTAN 75 MG PO TABS
75.0000 mg | ORAL_TABLET | Freq: Every day | ORAL | Status: DC
  Administered 2024-11-25 – 2024-12-08 (×14): 75 mg via ORAL
  Filled 2024-11-25 (×14): qty 1

## 2024-11-25 MED ORDER — GABAPENTIN 300 MG PO CAPS
300.0000 mg | ORAL_CAPSULE | Freq: Two times a day (BID) | ORAL | Status: DC
  Administered 2024-11-25 – 2024-12-08 (×26): 300 mg via ORAL
  Filled 2024-11-25 (×26): qty 1

## 2024-11-25 MED ORDER — RISPERIDONE 1 MG PO TABS
0.5000 mg | ORAL_TABLET | Freq: Every day | ORAL | Status: DC
  Administered 2024-11-25: 0.5 mg via ORAL
  Filled 2024-11-25: qty 1

## 2024-11-25 MED ORDER — DULOXETINE HCL 30 MG PO CPEP
30.0000 mg | ORAL_CAPSULE | Freq: Every day | ORAL | Status: DC
  Administered 2024-11-25 – 2024-11-28 (×4): 30 mg via ORAL
  Filled 2024-11-25 (×4): qty 1

## 2024-11-25 MED ORDER — FELODIPINE ER 5 MG PO TB24
10.0000 mg | ORAL_TABLET | Freq: Every day | ORAL | Status: DC
  Administered 2024-11-26 – 2024-12-08 (×11): 10 mg via ORAL
  Filled 2024-11-25: qty 2
  Filled 2024-11-25: qty 1
  Filled 2024-11-25 (×9): qty 2
  Filled 2024-11-25: qty 1
  Filled 2024-11-25 (×3): qty 2

## 2024-11-25 MED ORDER — MIRTAZAPINE 15 MG PO TABS
7.5000 mg | ORAL_TABLET | Freq: Every day | ORAL | Status: DC
  Administered 2024-11-25: 7.5 mg via ORAL
  Filled 2024-11-25: qty 1

## 2024-11-25 MED ORDER — PANTOPRAZOLE SODIUM 40 MG PO TBEC
40.0000 mg | DELAYED_RELEASE_TABLET | Freq: Two times a day (BID) | ORAL | Status: DC
  Administered 2024-11-25 – 2024-12-08 (×26): 40 mg via ORAL
  Filled 2024-11-25 (×26): qty 1

## 2024-11-25 MED ORDER — GABAPENTIN 300 MG PO CAPS: 300.0000 mg | ORAL_CAPSULE | Freq: Three times a day (TID) | ORAL | Status: DC

## 2024-11-25 MED ORDER — GABAPENTIN 300 MG PO CAPS
600.0000 mg | ORAL_CAPSULE | Freq: Every day | ORAL | Status: DC
  Administered 2024-11-25 – 2024-12-07 (×13): 600 mg via ORAL
  Filled 2024-11-25 (×13): qty 2

## 2024-11-25 NOTE — Progress Notes (Signed)
 Patient continues to respond to internal stimuli, AV and VH. Denies SI/HI. Requires ongoing redirection from going into other patients room and intruding on others space. Accepting medications without issue and is easily redirected.   11/25/24 1000  Psych Admission Type (Psych Patients Only)  Admission Status Voluntary  Psychosocial Assessment  Patient Complaints Anxiety;Worrying  Eye Contact Fair  Facial Expression Anxious  Affect Anxious;Fearful;Frightened  Speech Rapid;Language other than Scientist, Research (physical Sciences) Activity Fidgety  Appearance/Hygiene In scrubs;Unremarkable  Behavior Characteristics Cooperative  Mood Anxious  Thought Process  Coherency Disorganized  Content Paranoia;Delusions  Delusions Paranoid  Perception Hallucinations  Hallucination Visual;Auditory  Judgment Impaired  Confusion Mild  Danger to Self  Current suicidal ideation? Denies  Danger to Others  Danger to Others None reported or observed

## 2024-11-25 NOTE — Progress Notes (Signed)
" °   11/25/24 1950  Psych Admission Type (Psych Patients Only)  Admission Status Voluntary  Psychosocial Assessment  Patient Complaints None  Eye Contact Fair  Facial Expression Animated  Affect Anxious;Preoccupied  Speech Logical/coherent;Rapid;Language other than English (at times)  Information Systems Manager Activity Fidgety  Appearance/Hygiene Unremarkable;In scrubs  Behavior Characteristics Cooperative  Mood Anxious;Pleasant  Thought Process  Coherency Disorganized  Content Delusions;Paranoia  Delusions Paranoid  Perception Hallucinations  Hallucination Auditory;Visual  Judgment Impaired  Confusion Mild  Danger to Self  Current suicidal ideation? Denies  Danger to Others  Danger to Others None reported or observed    "

## 2024-11-25 NOTE — Care Plan (Signed)
 Sister in law called and reported that this onset of symptoms became more noticeable within the past month and pt was taken to see her PCP in [place] Dr. Perri.  Pt had a head CT but the results are unknown.  [name] Maynor (Sister in [place]) is listed as an emergency contact and would like to be notified of the status and plans for pt.  Pt was given Lorenes number and educated on how to share her patient code.

## 2024-11-25 NOTE — Care Plan (Signed)
 Avelina is known to pt as Jenkins Avelina and Estle are sister in laws to pt. Jenkins is watching pt 2 dogs while she is here Jenkins informed staff that pt cannot read and write Also informed staff that pt is very social able and enjoys being around people. When around others the voices the pt hears seem to be less.

## 2024-11-25 NOTE — Group Note (Signed)
 Date:  11/25/2024 Time:  10:33 AM  Group Topic/Focus:  Coping With Mental Health Crisis:   The purpose of this group is to help patients identify strategies for coping with mental health crisis.  Group discusses possible causes of crisis and ways to manage them effectively.    Participation Level:  Did Not Attend  Norleen SHAUNNA Bias 11/25/2024, 10:33 AM

## 2024-11-25 NOTE — Group Note (Signed)
 LCSW Group Therapy Note  Group Date: 11/25/2024 Start Time: 1032 End Time: 1110   Type of Therapy and Topic:  Group Therapy - Healthy vs Unhealthy Coping Skills  Participation Level:  Active   Description of Group The focus of this group was to determine what unhealthy coping techniques typically are used by group members and what healthy coping techniques would be helpful in coping with various problems. Patients were guided in becoming aware of the differences between healthy and unhealthy coping techniques. Patients were asked to identify 2-3 healthy coping skills they would like to learn to use more effectively.  Therapeutic Goals Patients learned that coping is what human beings do all day long to deal with various situations in their lives Patients defined and discussed healthy vs unhealthy coping techniques Patients identified their preferred coping techniques and identified whether these were healthy or unhealthy Patients determined 2-3 healthy coping skills they would like to become more familiar with and use more often. Patients provided support and ideas to each other   Summary of Patient Progress:  During group, Babbette expressed understanding of content with social cues from the facilitator Patient proved open to input from peers and feedback from CSW. Patient demonstrated insight into the subject matter, was respectful of peers, and participated throughout the entire session.   Therapeutic Modalities Cognitive Behavioral Therapy Motivational Interviewing  Cher Egnor W Oather [place], LCSWA 11/25/2024  11:56 AM

## 2024-11-25 NOTE — [facility] Suicide Risk Assessment (Signed)
 [facility] Admission Suicide Risk Assessment   Nursing information obtained from:  Patient Demographic factors:  Divorced or widowed, Living alone, Age 79 or older Current Mental Status:  NA Loss Factors:  NA Historical Factors:  NA Risk Reduction Factors:  Positive social support, Positive therapeutic relationship  Total Time spent with patient: 45 minutes Principal Problem: Psychosis (HCC) Diagnosis:  Principal Problem:   Psychosis (HCC)  Subjective Data: Patient reports that she is hearing voices and they are bothering  Continued Clinical Symptoms:  Alcohol Use Disorder Identification Test Final Score (AUDIT): 0 The Alcohol Use Disorders Identification Test, Guidelines for Use in Primary Care, Second Edition.  World Science Writer [facility]). Score between 0-7:  no or low risk or alcohol related problems. Score between 8-15:  moderate risk of alcohol related problems. Score between 16-19:  high risk of alcohol related problems. Score 20 or above:  warrants further diagnostic evaluation for alcohol dependence and treatment.   CLINICAL FACTORS:   More than one psychiatric diagnosis   Musculoskeletal: Strength & Muscle Tone: within normal limits Gait & Station: normal Patient leans: N/A  Psychiatric Specialty Exam: Appearance: Elderly female, appropriately dressed, appears anxious and hypervigilant  Behavior: Cooperative but guarded  Speech: Normal rate, volume low due to whispering  Mood: Anxious, fearful  Affect: Congruent with mood, anxious  Thought Process: Linear, organized but paranoid content  Thought Content: Auditory hallucinations present; denies suicidal or homicidal ideation currently  Perceptions: Auditory hallucinations (voices are evil)  Cognition: Alert and oriented to person, place, and time; mild confusion noted, may reflect early cognitive decline  Insight/Judgment: Poor insight into perceptual disturbances  Impulse Control: Fair      Physical Exam: Physical Exam ROS Blood pressure (!) 154/88, pulse 88, temperature 98.1 F (36.7 C), temperature source Oral, resp. rate 17, height 4' 10 (1.473 m), weight 38.8 kg. Body mass index is 17.87 kg/m.   COGNITIVE FEATURES THAT CONTRIBUTE TO RISK:  Closed-mindedness    SUICIDE RISK:   Minimal: No identifiable suicidal ideation.  Patients presenting with no risk factors but with morbid ruminations; may be classified as minimal risk based on the severity of the depressive symptoms  PLAN OF CARE:   I certify that inpatient services furnished can reasonably be expected to improve the patient's condition.   Millie JONELLE Manners, MD 11/25/2024, 11:02 AM

## 2024-11-25 NOTE — Plan of Care (Signed)
?  Problem: Education: ?Goal: Ability to state activities that reduce stress will improve ?Outcome: Not Progressing ?  ?Problem: Coping: ?Goal: Ability to identify and develop effective coping behavior will improve ?Outcome: Not Progressing ?  ?Problem: Self-Concept: ?Goal: Ability to identify factors that promote anxiety will improve ?Outcome: Not Progressing ?  ?

## 2024-11-25 NOTE — H&P (Signed)
 "   Legal Status: Voluntary Source of Information: Patient interview, collateral from sister-in-law Sarah Sullivan), and chart review Reliability: Limited due to perceptual disturbances and paranoia  Chief Complaint  Progressive auditory hallucinations and paranoia over the past month, worsening over the last two weeks.  Every time I talk, I hear voices that are evil. My husband is evil. I am not safe at home.  History of Present Illness  Sarah Sullivan is a 79 year old female with a past psychiatric history of depression and mood disturbances, presenting to the ED for worsening auditory hallucinations. She reports hearing voices telling her that people are trying to harm her, and she has been increasingly paranoid. She describes having to whisper while speaking and being hypervigilant at home, checking windows every 15 minutes.  She reports difficulty sleeping and increased confusion, including misperceptions regarding her dogs leaving the house. She denies suicidal ideation and homicidal ideation today but endorses some thoughts of hurting others per her primary care provider, Sarah Sullivan.  Collateral history from her sister-in-law notes progressive worsening of hallucinations over the past six weeks, severe paranoia, social withdrawal, and safety concerns at home. The patient lives alone with two dogs.  She has a history of being compliant with antidepressant therapy, currently prescribed Cymbalta  30 mg daily, Neurontin , and Remeron , although medication history is somewhat unclear due to recent changes. She has no known recent alcohol or drug use.  A recent head CT was performed; results are pending/unknown.  Past Psychiatric History  Depression, recurrent  Anxiety  No prior psychiatric hospitalizations reported  Past Medical History  Hypertension (if known, fill)  Possible early cognitive decline / rule out dementia  Medications  Cymbalta  30 mg daily  Neurontin  (dosage  unknown)  Remeron  (dosage unknown)  Allergies  NKDA  Family / Social History  Lives alone with two dogs  Sister-in-law is primary collateral contact: Sarah Sullivan, [phone]  Support system limited, sister-in-law actively involved  Substance Use  Denies alcohol or illicit drug use in past 24 hours  No current or past substance abuse reported  Mental Status Examination (ED)  Appearance: Elderly female, appropriately dressed, appears anxious and hypervigilant  Behavior: Cooperative but guarded  Speech: Normal rate, volume low due to whispering  Mood: Anxious, fearful  Affect: Congruent with mood, anxious  Thought Process: Linear, organized but paranoid content  Thought Content: Auditory hallucinations present; denies suicidal or homicidal ideation currently  Perceptions: Auditory hallucinations (voices are evil)  Cognition: Alert and oriented to person, place, and time; mild confusion noted, may reflect early cognitive decline  Insight/Judgment: Poor insight into perceptual disturbances  Impulse Control: Fair  Vital Signs  Within normal limits (per ED records)  Assessment  Primary Diagnosis:  Psychosis, unspecified (rule out dementia-related psychosis)  Differential Diagnoses:  Early-onset or mixed dementia contributing to perceptual disturbances  Major depressive disorder with psychotic features  Medication-induced psychosis (unlikely at current dosages)  Delirium secondary to acute medical illness (pending labs and workup)  Plan  Medication Management:  Initiate Risperdal  1 mg nightly for psychotic symptoms  Continue current antidepressant regimen: Cymbalta , Neurontin , Remeron   Monitor for side effects, EPS, sedation, orthostatic hypotension  Diagnostics / Labs:  Review head CT results  CBC, CMP, TSH, B12, folate to rule out metabolic contributors to confusion  Consider MMSE or MoCA for cognitive baseline  Safety / Risk  Assessment:  Denies current suicidal or homicidal ideation  Sister-in-law involved for ongoing safety monitoring  Patient not considered high-risk for self-harm at this time  Consults / Referrals:  Psychiatry inpatient or outpatient follow-up based on clinical course  Consider geriatric psychiatry evaluation for cognitive decline and psychosis  Disposition:  Remain in ED under observation until medically cleared and psychotic symptoms stabilized  Family/caregiver education regarding safety and symptom monitoring  Education:  Discussed potential benefits and side effects of risperidone   Provided contact information to sister-in-law for updates and coordination  Sarah Sullivan M.D 11.06 "

## 2024-11-26 DIAGNOSIS — F29 Unspecified psychosis not due to a substance or known physiological condition: Secondary | ICD-10-CM | POA: Diagnosis not present

## 2024-11-26 MED ORDER — RISPERIDONE 1 MG PO TABS
1.0000 mg | ORAL_TABLET | Freq: Two times a day (BID) | ORAL | Status: DC
  Administered 2024-11-26 – 2024-11-27 (×3): 1 mg via ORAL
  Filled 2024-11-26 (×3): qty 1

## 2024-11-26 MED ORDER — MIRTAZAPINE 15 MG PO TABS
15.0000 mg | ORAL_TABLET | Freq: Every day | ORAL | Status: DC
  Administered 2024-11-26 – 2024-12-07 (×12): 15 mg via ORAL
  Filled 2024-11-26 (×12): qty 1

## 2024-11-26 MED ORDER — RISPERIDONE 1 MG PO TABS: 1.0000 mg | ORAL_TABLET | Freq: Every day | ORAL | Status: DC

## 2024-11-26 NOTE — Plan of Care (Signed)
" °  Problem: Activity: Goal: Interest or engagement in leisure activities will improve Outcome: Not Progressing   Problem: Health Behavior/Discharge Planning: Goal: Compliance with therapeutic regimen will improve Outcome: Progressing   Problem: Physical Regulation: Goal: Ability to maintain clinical measurements within normal limits will improve Outcome: Progressing   Problem: Safety: Goal: Periods of time without injury will increase 11/26/2024 0107 by Gretel Winn PARAS, RN Outcome: Progressing 11/25/2024 2342 by Gretel Winn PARAS, RN Outcome: Progressing   Problem: Education: Goal: Will be free of psychotic symptoms 11/26/2024 0107 by Gretel Winn PARAS, RN Outcome: Not Progressing 11/25/2024 2342 by Gretel Winn PARAS, RN Outcome: Not Progressing   Problem: Self-Care: Goal: Ability to participate in self-care as condition permits will improve Outcome: Progressing   Problem: Self-Care: Goal: Ability to participate in self-care as condition permits will improve Outcome: Progressing   "

## 2024-11-26 NOTE — Group Note (Signed)
 Date:  11/26/2024 Time:  2:14 PM  Group Topic/Focus:  Goals Group:   The focus of this group is to help patients establish daily goals to achieve during treatment and discuss how the patient can incorporate goal setting into their daily lives to aide in recovery.    Participation Level:  Active  Participation Quality:  Appropriate, Intrusive, and Monopolizing  Affect:  Excited and Not Congruent  Cognitive:  Alert, Confused, Delusional, and Hallucinating  Insight: Lacking and Limited  Engagement in Group:  Limited and Off Topic  Modes of Intervention:  Discussion  Additional Comments:     Maglione,Anel Creighton E 11/26/2024, 2:14 PM

## 2024-11-26 NOTE — Group Note (Signed)
 [facility] LCSW Group Therapy Note   Group Date: 11/26/2024 Start Time: 1030 End Time: 1115   Type of Therapy/Topic:  Group Therapy:  Emotion Regulation  Participation Level:  Did Not Attend   Mood: Not able to access.  Patient did not attend group.  Description of Group:    The purpose of this group is to assist patients in learning to regulate negative emotions and experience positive emotions. Patients will be guided to discuss ways in which they have been vulnerable to their negative emotions. These vulnerabilities will be juxtaposed with experiences of positive emotions or situations, and patients challenged to use positive emotions to combat negative ones. Special emphasis will be placed on coping with negative emotions in conflict situations, and patients will process healthy conflict resolution skills.  Therapeutic Goals: Patient will identify two positive emotions or experiences to reflect on in order to balance out negative emotions:  Patient will label two or more emotions that they find the most difficult to experience:  Patient will be able to demonstrate positive conflict resolution skills through discussion or role plays:   Summary of Patient Progress:  Patient did not participate in group.  Therapeutic Modalities:   Cognitive Behavioral Therapy Feelings Identification Dialectical Behavioral Therapy   Rexene LELON Mae, LCSWA

## 2024-11-26 NOTE — Group Note (Signed)
 Date:  11/26/2024 Time:  3:46 PM  Group Topic/Focus:  Wellness Toolbox:   The focus of this group is to discuss various aspects of wellness, balancing those aspects and exploring ways to increase the ability to experience wellness.  Patients will create a wellness toolbox for use upon discharge.    Participation Level:  Minimal  Participation Quality:  Appropriate  Affect:  Appropriate  Cognitive:  Alert  Insight: Appropriate  Engagement in Group:  Limited  Modes of Intervention:  Discussion  Additional Comments:     Sullivan,Sarah Cobern E 11/26/2024, 3:46 PM

## 2024-11-26 NOTE — Plan of Care (Signed)
" °  Problem: Education: Goal: Ability to state activities that reduce stress will improve Outcome: Not Progressing   Problem: Coping: Goal: Ability to identify and develop effective coping behavior will improve Outcome: Not Progressing   Problem: Self-Concept: Goal: Ability to identify factors that promote anxiety will improve Outcome: Not Progressing Goal: Level of anxiety will decrease Outcome: Not Progressing Goal: Ability to modify response to factors that promote anxiety will improve Outcome: Not Progressing   Problem: Education: Goal: Utilization of techniques to improve thought processes will improve Outcome: Not Progressing Goal: Knowledge of the prescribed therapeutic regimen will improve Outcome: Not Progressing   Problem: Activity: Goal: Interest or engagement in leisure activities will improve Outcome: Not Progressing   "

## 2024-11-26 NOTE — Progress Notes (Signed)
 Patient slept through breakfast. Present in dayroom for lunch and dinner. Accepted medications without issue. Continues to report AH/VH. Denies SI/HI. Worsening confusion as the evening continues. 1:1 order in place overnight. Ongoing monitoring continues.   11/26/24 1000  Psych Admission Type (Psych Patients Only)  Admission Status Voluntary  Psychosocial Assessment  Patient Complaints Worrying  Eye Contact Fair  Facial Expression Animated  Affect Anxious;Preoccupied  Speech Language other than Sport And Exercise Psychologist Activity Fidgety  Appearance/Hygiene In scrubs  Behavior Characteristics Cooperative  Mood Anxious;Pleasant  Thought Process  Coherency Disorganized  Content Delusions;Paranoia  Delusions Paranoid  Perception Hallucinations  Hallucination Auditory;Visual  Judgment Impaired  Confusion Mild  Danger to Self  Current suicidal ideation? Denies  Danger to Others  Danger to Others None reported or observed

## 2024-11-26 NOTE — Progress Notes (Signed)
 [facility] MD Progress Note  11/26/2024 2:27 PM Sarah Sullivan  MRN:  [id number] Subjective: They are after ME Patient is a 79 year old female who presented with significant psychosis she continues to be delusional I spoke to the family also they report that she has been very confused and delusional about neighbors and family and has not been sleeping and eating Principal Problem: Psychosis (HCC) Diagnosis: Principal Problem:   Psychosis (HCC)  Total Time spent with patient: 45 minutes  Past Psychiatric History: Dementia related  Past Medical History:  Past Medical History:  Diagnosis Date   Anemia    Hypertension     Past Surgical History:  Procedure Laterality Date   ABDOMINAL HYSTERECTOMY     COLONOSCOPY N/A 04/26/2019   Procedure: COLONOSCOPY;  Surgeon: Unk Corinn Skiff, MD;  Location: [facility] ENDOSCOPY;  Service: Gastroenterology;  Laterality: N/A;   COLONOSCOPY N/A 04/27/2019   Procedure: COLONOSCOPY;  Surgeon: Unk Corinn Skiff, MD;  Location: [facility] ENDOSCOPY;  Service: Gastroenterology;  Laterality: N/A;   ESOPHAGOGASTRODUODENOSCOPY N/A 04/25/2019   Procedure: ESOPHAGOGASTRODUODENOSCOPY (EGD);  Surgeon: Unk Corinn Skiff, MD;  Location: [facility] ENDOSCOPY;  Service: Gastroenterology;  Laterality: N/A;   ESOPHAGOGASTRODUODENOSCOPY N/A 04/26/2019   Procedure: ESOPHAGOGASTRODUODENOSCOPY (EGD);  Surgeon: Unk Corinn Skiff, MD;  Location: [facility] ENDOSCOPY;  Service: Gastroenterology;  Laterality: N/A;   TEMPORARY DIALYSIS CATHETER N/A 04/21/2019   Procedure: TEMPORARY DIALYSIS CATHETER;  Surgeon: Marea Selinda RAMAN, MD;  Location: [facility] INVASIVE CV LAB;  Service: Cardiovascular;  Laterality: N/A;   Family History:  Family History  Problem Relation Age of Onset   Breast cancer Neg Hx    Family Psychiatric  History: Nonsignificant Social History:  Social History   Substance and Sexual Activity  Alcohol Use Not Currently     Social History   Substance and Sexual Activity  Drug Use Not  Currently    Social History   Socioeconomic History   Marital status: Married    Spouse name: Not on file   Number of children: Not on file   Years of education: Not on file   Highest education level: Not on file  Occupational History   Not on file  Tobacco Use   Smoking status: Never   Smokeless tobacco: Never  Substance and Sexual Activity   Alcohol use: Not Currently   Drug use: Not Currently   Sexual activity: Not on file  Other Topics Concern   Not on file  Social History Narrative   Not on file   Social Drivers of Health   Tobacco Use: Low Risk (11/25/2024)   Patient History    Smoking Tobacco Use: Never    Smokeless Tobacco Use: Never    Passive Exposure: Not on file  Financial Resource Strain: Low Risk  (10/18/2024)   Received from [facility]   Overall Financial Resource Strain (CARDIA)    Difficulty of Paying Living Expenses: Not hard at all  Food Insecurity: No Food Insecurity (11/25/2024)   Epic    Worried About Radiation Protection Practitioner of Food in the Last Year: Never true    Ran Out of Food in the Last Year: Never true  Transportation Needs: No Transportation Needs (11/25/2024)   Epic    Lack of Transportation (Medical): No    Lack of Transportation (Non-Medical): No  Physical Activity: Not on file  Stress: Not on file  Social Connections: Socially Isolated (11/25/2024)   Social Connection and Isolation Panel    Frequency of Communication with Friends and Family: Never  Frequency of Social Gatherings with Friends and Family: Never    Attends Religious Services: Never    Database Administrator or Organizations: No    Attends Banker Meetings: Never    Marital Status: Widowed  Depression (PHQ2-9): Not on file  Alcohol Screen: Low Risk (11/24/2024)   Alcohol Screen    Last Alcohol Screening Score (AUDIT): 0  Housing: Low Risk (11/25/2024)   Epic    Unable to Pay for Housing in the Last Year: No    Number of Times Moved in the Last Year: 0     Homeless in the Last Year: No  Utilities: Not At Risk (11/25/2024)   Epic    Threatened with loss of utilities: No  Health Literacy: Not on file   Additional Social History:                         Sleep: Poor Estimated Sleeping Duration (Last 24 Hours): 9.50-11.25 hours  Appetite:  Poor  Current Medications: Current Facility-Administered Medications  Medication Dose Route Frequency Provider Last Rate Last Admin   acetaminophen  (TYLENOL ) tablet 650 mg  650 mg Oral Q6H PRN McLauchlin, Jon, NP   650 mg at 11/24/24 2338   alum & mag hydroxide-simeth (MAALOX/MYLANTA) 200-200-20 MG/5ML suspension 30 mL  30 mL Oral Q4H PRN McLauchlin, Angela, NP       atorvastatin  (LIPITOR ) tablet 80 mg  80 mg Oral Daily Fransheska Willingham R, MD   80 mg at 11/26/24 1112   DULoxetine  (CYMBALTA ) DR capsule 30 mg  30 mg Oral Daily Jirah Rider R, MD   30 mg at 11/26/24 1112   felodipine  (PLENDIL ) 24 hr tablet 10 mg  10 mg Oral Daily Sharayah Renfrow R, MD   10 mg at 11/26/24 1156   gabapentin  (NEURONTIN ) capsule 300 mg  300 mg Oral BID Elesa Perkins, RPH   300 mg at 11/26/24 1113   And   gabapentin  (NEURONTIN ) capsule 600 mg  600 mg Oral QHS Elesa Perkins, RPH   600 mg at 11/25/24 2109   irbesartan  (AVAPRO ) tablet 75 mg  75 mg Oral Daily Wyat Infinger R, MD   75 mg at 11/26/24 1113   magnesium  hydroxide (MILK OF MAGNESIA) suspension 30 mL  30 mL Oral Daily PRN McLauchlin, Jon, NP       mirtazapine  (REMERON ) tablet 7.5 mg  7.5 mg Oral QHS Breean Nannini R, MD   7.5 mg at 11/25/24 2109   OLANZapine  zydis (ZYPREXA ) disintegrating tablet 5 mg  5 mg Oral TID PRN McLauchlin, Angela, NP   5 mg at 11/25/24 2318   pantoprazole  (PROTONIX ) EC tablet 40 mg  40 mg Oral BID AC Braiden Rodman R, MD   40 mg at 11/26/24 1112   risperiDONE  (RISPERDAL ) tablet 1 mg  1 mg Oral QHS Sabah Zucco R, MD       traZODone  (DESYREL ) tablet 50 mg  50 mg Oral QHS PRN McLauchlin, Angela, NP   50 mg at 11/25/24  2318    Lab Results:  Results for orders placed or performed during the hospital encounter of 11/24/24 (from the past 48 hours)  Resp panel by RT-PCR (RSV, Flu A&B, Covid) Anterior Nasal Swab     Status: None   Collection Time: 11/24/24  5:35 PM   Specimen: Anterior Nasal Swab  Result Value Ref Range   SARS Coronavirus 2 by RT PCR NEGATIVE NEGATIVE    Comment: (NOTE) SARS-CoV-2 target nucleic  acids are NOT DETECTED.  The SARS-CoV-2 RNA is generally detectable in upper respiratory specimens during the acute phase of infection. The lowest concentration of SARS-CoV-2 viral copies this assay can detect is 138 copies/mL. A negative result does not preclude SARS-Cov-2 infection and should not be used as the sole basis for treatment or other patient management decisions. A negative result may occur with  improper specimen collection/handling, submission of specimen other than nasopharyngeal swab, presence of viral mutation(s) within the areas targeted by this assay, and inadequate number of viral copies(<138 copies/mL). A negative result must be combined with clinical observations, patient history, and epidemiological information. The expected result is Negative.  Fact Sheet for Patients:  bloggercourse.com  Fact Sheet for Healthcare Providers:  seriousbroker.it  This test is no t yet approved or cleared by the [place]  FDA and  has been authorized for detection and/or diagnosis of SARS-CoV-2 by FDA under an Emergency Use Authorization (EUA). This EUA will remain  in effect (meaning this test can be used) for the duration of the COVID-19 declaration under Section 564(b)(1) of the Act, 21 U.S.C.section 360bbb-3(b)(1), unless the authorization is terminated  or revoked sooner.       Influenza A by PCR NEGATIVE NEGATIVE   Influenza B by PCR NEGATIVE NEGATIVE    Comment: (NOTE) The Xpert Xpress SARS-CoV-2/FLU/RSV plus assay is  intended as an aid in the diagnosis of influenza from Nasopharyngeal swab specimens and should not be used as a sole basis for treatment. Nasal washings and aspirates are unacceptable for Xpert Xpress SARS-CoV-2/FLU/RSV testing.  Fact Sheet for Patients: bloggercourse.com  Fact Sheet for Healthcare Providers: seriousbroker.it  This test is not yet approved or cleared by the [place]  FDA and has been authorized for detection and/or diagnosis of SARS-CoV-2 by FDA under an Emergency Use Authorization (EUA). This EUA will remain in effect (meaning this test can be used) for the duration of the COVID-19 declaration under Section 564(b)(1) of the Act, 21 U.S.C. section 360bbb-3(b)(1), unless the authorization is terminated or revoked.     Resp Syncytial Virus by PCR NEGATIVE NEGATIVE    Comment: (NOTE) Fact Sheet for Patients: bloggercourse.com  Fact Sheet for Healthcare Providers: seriousbroker.it  This test is not yet approved or cleared by the [place]  FDA and has been authorized for detection and/or diagnosis of SARS-CoV-2 by FDA under an Emergency Use Authorization (EUA). This EUA will remain in effect (meaning this test can be used) for the duration of the COVID-19 declaration under Section 564(b)(1) of the Act, 21 U.S.C. section 360bbb-3(b)(1), unless the authorization is terminated or revoked.  Performed at [facility], [address] [place], [place] [address]     Blood Alcohol level:  Lab Results  Component Value Date   [facility] <15 11/24/2024    Metabolic Disorder Labs: Lab Results  Component Value Date   HGBA1C 5.3 04/29/2019   MPG 105.41 04/29/2019   No results found for: PROLACTIN Lab Results  Component Value Date   CHOL 175 04/29/2019   TRIG 93 04/29/2019   HDL 68 04/29/2019   CHOLHDL 2.6 04/29/2019   VLDL 19 04/29/2019   LDLCALC  88 04/29/2019    Physical Findings: AIMS:  ,  ,  ,  ,  ,  ,   CIWA:    COWS:     Musculoskeletal: Strength & Muscle Tone: within normal limits Gait & Station: normal Patient leans: N/A  Psychiatric Specialty Exam:  Patient is a 79 year old female who appears stated  age alert oriented x 2 mood is okay affect is anxious denies any suicidal thoughts highly paranoid memory is somewhat deficit Insight and judgment limited   Physical Exam: Physical Exam ROS Blood pressure (!) 159/75, pulse 81, temperature 98.1 F (36.7 C), temperature source Oral, resp. rate 17, height 4' 10 (1.473 m), weight 38.8 kg. Body mass index is 17.87 kg/m. Diagnosis of dementia related psychosis with behavioral disturbance plan is to increase Risperdal  to 1 mg p.o. twice daily continue inpatient hospitalization  Treatment Plan Summary: Daily contact with patient to assess and evaluate symptoms and progress in treatment  Millie JONELLE Manners, MD 11/26/2024, 2:27 PM

## 2024-11-26 NOTE — Group Note (Signed)
 Date:  11/26/2024 Time:  8:54 PM  Group Topic/Focus:  Wrap-Up Group:   The focus of this group is to help patients review their daily goal of treatment and discuss progress on daily workbooks.    Participation Level:  Active  Participation Quality:  Appropriate  Affect:  Appropriate  Cognitive:  Alert  Insight: Limited  Engagement in Group:  Limited  Modes of Intervention:  Discussion  Additional Comments:    Sarah Sullivan CHRISTELLA Bunker 11/26/2024, 8:54 PM

## 2024-11-27 DIAGNOSIS — F29 Unspecified psychosis not due to a substance or known physiological condition: Secondary | ICD-10-CM | POA: Diagnosis not present

## 2024-11-27 MED ORDER — ENSURE PLUS HIGH PROTEIN PO LIQD
237.0000 mL | Freq: Two times a day (BID) | ORAL | Status: DC
  Administered 2024-11-27 – 2024-11-28 (×2): 237 mL via ORAL

## 2024-11-27 MED ORDER — RISPERIDONE 1 MG PO TABS
2.0000 mg | ORAL_TABLET | Freq: Two times a day (BID) | ORAL | Status: DC
  Administered 2024-11-28 – 2024-12-01 (×7): 2 mg via ORAL
  Filled 2024-11-27 (×7): qty 2

## 2024-11-27 NOTE — Group Note (Signed)
 Recreation Therapy Group Note   Group Topic:Stress Management  Group Date: 11/27/2024 Start Time: 1415 End Time: 1440 Facilitators: Celestia Jeoffrey BRAVO, LRT, CTRS Location: Dayroom  Group Description: Meditation. LRT asks patients their current level of stress/anxiety from 1-10, with 10 being the highest. LRT educated on the benefits of meditation and relaxation techniques, and how it can apply to everyday life post-discharge. LRT and pt's followed along to an audio script of a guided meditation video. LRT asked pt their level of stress and anxiety once the prompt was finished. LRT facilitated post-activity processing to gain feedback on session.   Goal Area(s) Addressed:  Patient will practice using relaxation technique. Patient will identify a new coping skill.  Patient will follow multistep directions to reduce anxiety and stress.   Affect/Mood: Asleep   Participation Level: Non-verbal    Clinical Observations/Individualized Feedback: Sarah Sullivan was asleep duration of session.   Plan: Continue to engage patient in RT group sessions 2-3x/week.   Jeoffrey BRAVO Celestia, LRT, CTRS 11/27/2024 4:42 PM

## 2024-11-27 NOTE — Progress Notes (Signed)
 [facility] MD Progress Note  11/27/2024 11:16 PM Sarah Sullivan  MRN:  [id number] Sarah Sullivan is a 79 year old female with a past psychiatric history of depression and mood disturbances, presenting to the ED for worsening auditory hallucinations. She reports hearing voices telling her that people are trying to harm her, and she has been increasingly paranoid. She describes having to whisper while speaking and being hypervigilant at home, checking windows every 15 minutes.Patient is admitted to [facility] unit with Q15 min safety monitoring. Multidisciplinary team approach is offered. Medication management; group/milieu therapy is offered.   Subjective:  Chart reviewed, case discussed in multidisciplinary meeting, patient seen during rounds.    Patient met with the treatment team today.  She reports worsening auditory hallucinations telling her to kill herself and other people, command type that led up to the hospitalization.  She reports ongoing auditory hallucinations.  She denies active SI/HI plan.  She reports having problems with her dentures and unable to eat solid food.  Treatment team discussed about changing her diet to soft diet.  She is taking medications and denies having any side effects.  She is requesting outpatient visit mental health services.  She identifies her sister and brother as her support system. Past Psychiatric History: see h&P Family History:  Family History  Problem Relation Age of Onset   Breast cancer Neg Hx    Social History:  Social History   Substance and Sexual Activity  Alcohol Use Not Currently     Social History   Substance and Sexual Activity  Drug Use Not Currently    Social History   Socioeconomic History   Marital status: Married    Spouse name: Not on file   Number of children: Not on file   Years of education: Not on file   Highest education level: Not on file  Occupational History   Not on file  Tobacco Use   Smoking status: Never   Smokeless  tobacco: Never  Substance and Sexual Activity   Alcohol use: Not Currently   Drug use: Not Currently   Sexual activity: Not on file  Other Topics Concern   Not on file  Social History Narrative   Not on file   Social Drivers of Health   Tobacco Use: Low Risk (11/25/2024)   Patient History    Smoking Tobacco Use: Never    Smokeless Tobacco Use: Never    Passive Exposure: Not on file  Financial Resource Strain: Low Risk  (10/18/2024)   Received from [facility]   Overall Financial Resource Strain (CARDIA)    Difficulty of Paying Living Expenses: Not hard at all  Food Insecurity: No Food Insecurity (11/25/2024)   Epic    Worried About Radiation Protection Practitioner of Food in the Last Year: Never true    Ran Out of Food in the Last Year: Never true  Transportation Needs: No Transportation Needs (11/25/2024)   Epic    Lack of Transportation (Medical): No    Lack of Transportation (Non-Medical): No  Physical Activity: Not on file  Stress: Not on file  Social Connections: Socially Isolated (11/25/2024)   Social Connection and Isolation Panel    Frequency of Communication with Friends and Family: Never    Frequency of Social Gatherings with Friends and Family: Never    Attends Religious Services: Never    Database Administrator or Organizations: No    Attends Banker Meetings: Never    Marital Status: Widowed  Depression (EYV7-0): Not  on file  Alcohol Screen: Low Risk (11/24/2024)   Alcohol Screen    Last Alcohol Screening Score (AUDIT): 0  Housing: Low Risk (11/25/2024)   Epic    Unable to Pay for Housing in the Last Year: No    Number of Times Moved in the Last Year: 0    Homeless in the Last Year: No  Utilities: Not At Risk (11/25/2024)   Epic    Threatened with loss of utilities: No  Health Literacy: Not on file   Past Medical History:  Past Medical History:  Diagnosis Date   Anemia    Hypertension     Past Surgical History:  Procedure Laterality Date    ABDOMINAL HYSTERECTOMY     COLONOSCOPY N/A 04/26/2019   Procedure: COLONOSCOPY;  Surgeon: Unk Corinn Skiff, MD;  Location: [facility] ENDOSCOPY;  Service: Gastroenterology;  Laterality: N/A;   COLONOSCOPY N/A 04/27/2019   Procedure: COLONOSCOPY;  Surgeon: Unk Corinn Skiff, MD;  Location: [facility] ENDOSCOPY;  Service: Gastroenterology;  Laterality: N/A;   ESOPHAGOGASTRODUODENOSCOPY N/A 04/25/2019   Procedure: ESOPHAGOGASTRODUODENOSCOPY (EGD);  Surgeon: Unk Corinn Skiff, MD;  Location: [facility] ENDOSCOPY;  Service: Gastroenterology;  Laterality: N/A;   ESOPHAGOGASTRODUODENOSCOPY N/A 04/26/2019   Procedure: ESOPHAGOGASTRODUODENOSCOPY (EGD);  Surgeon: Unk Corinn Skiff, MD;  Location: [facility] ENDOSCOPY;  Service: Gastroenterology;  Laterality: N/A;   TEMPORARY DIALYSIS CATHETER N/A 04/21/2019   Procedure: TEMPORARY DIALYSIS CATHETER;  Surgeon: Marea Selinda RAMAN, MD;  Location: [facility] INVASIVE CV LAB;  Service: Cardiovascular;  Laterality: N/A;    Current Medications: Current Facility-Administered Medications  Medication Dose Route Frequency Provider Last Rate Last Admin   acetaminophen  (TYLENOL ) tablet 650 mg  650 mg Oral Q6H PRN McLauchlin, Angela, NP   650 mg at 11/24/24 2338   alum & mag hydroxide-simeth (MAALOX/MYLANTA) 200-200-20 MG/5ML suspension 30 mL  30 mL Oral Q4H PRN McLauchlin, Angela, NP       atorvastatin  (LIPITOR ) tablet 80 mg  80 mg Oral Daily Madaram, Kondal R, MD   80 mg at 11/27/24 0920   DULoxetine  (CYMBALTA ) DR capsule 30 mg  30 mg Oral Daily Madaram, Kondal R, MD   30 mg at 11/27/24 0923   feeding supplement (ENSURE PLUS HIGH PROTEIN) liquid 237 mL  237 mL Oral BID BM Lilyanne Mcquown, MD   237 mL at 11/27/24 1000   felodipine  (PLENDIL ) 24 hr tablet 10 mg  10 mg Oral Daily Madaram, Kondal R, MD   10 mg at 11/26/24 1156   gabapentin  (NEURONTIN ) capsule 300 mg  300 mg Oral BID Elesa Perkins, RPH   300 mg at 11/27/24 1343   And   gabapentin  (NEURONTIN ) capsule 600 mg  600 mg Oral QHS Elesa Perkins, RPH   600 mg at 11/27/24 2052   irbesartan  (AVAPRO ) tablet 75 mg  75 mg Oral Daily Madaram, Kondal R, MD   75 mg at 11/27/24 9077   magnesium  hydroxide (MILK OF MAGNESIA) suspension 30 mL  30 mL Oral Daily PRN McLauchlin, Angela, NP       mirtazapine  (REMERON ) tablet 15 mg  15 mg Oral QHS Madaram, Kondal R, MD   15 mg at 11/27/24 2049   OLANZapine  zydis (ZYPREXA ) disintegrating tablet 5 mg  5 mg Oral TID PRN McLauchlin, Angela, NP   5 mg at 11/25/24 2318   pantoprazole  (PROTONIX ) EC tablet 40 mg  40 mg Oral BID AC Madaram, Kondal R, MD   40 mg at 11/27/24 1653   risperiDONE  (RISPERDAL ) tablet 1 mg  1 mg Oral  BID Madaram, Kondal R, MD   1 mg at 11/27/24 2048   traZODone  (DESYREL ) tablet 50 mg  50 mg Oral QHS PRN McLauchlin, Angela, NP   50 mg at 11/27/24 2049    Lab Results: No results found for this or any previous visit (from the past 48 hours).  Blood Alcohol level:  Lab Results  Component Value Date   [facility] <15 11/24/2024    Metabolic Disorder Labs: Lab Results  Component Value Date   HGBA1C 5.3 04/29/2019   MPG 105.41 04/29/2019   No results found for: PROLACTIN Lab Results  Component Value Date   CHOL 175 04/29/2019   TRIG 93 04/29/2019   HDL 68 04/29/2019   CHOLHDL 2.6 04/29/2019   VLDL 19 04/29/2019   LDLCALC 88 04/29/2019    Physical Findings: AIMS:  , ,  ,  ,    CIWA:    COWS:      Psychiatric Specialty Exam:  Presentation  General Appearance: Bizarre  Eye Contact:Fleeting  Speech:Slow; Slurred  Speech Volume:Decreased    Mood and Affect  Mood:Anxious  Affect:Blunt   Thought Process  Thought Processes:Disorganized  Orientation:Partial  Thought Content:Illogical; Paranoid Ideation; Delusions  Hallucinations:Hallucinations: Auditory; Command  Ideas of Reference:No data recorded Suicidal Thoughts:Suicidal Thoughts: No  Homicidal Thoughts:Homicidal Thoughts: No   Sensorium  Memory:Immediate Fair; Remote  Fair  Judgment:Impaired  Insight:Shallow   Executive Functions  Concentration:Poor  Attention Span:Fair  Recall:Fair  Fund of Knowledge:Fair  Language:Fair   Psychomotor Activity  Psychomotor Activity:Psychomotor Activity: Normal  Musculoskeletal: Strength & Muscle Tone: decreased Gait & Station: unsteady Assets  Assets:No data recorded   Physical Exam: Physical Exam Vitals and nursing note reviewed.    ROS Blood pressure (!) 156/57, pulse 86, temperature 98.1 F (36.7 C), resp. rate 14, height 4' 10 (1.473 m), weight 38.8 kg, SpO2 100%. Body mass index is 17.87 kg/m.  Diagnosis: Principal Problem:   Psychosis (HCC)   PLAN: Safety and Monitoring:  -- Voluntary admission to inpatient psychiatric unit for safety, stabilization and treatment  -- Daily contact with patient to assess and evaluate symptoms and progress in treatment  -- Patient's case to be discussed in multi-disciplinary team meeting  -- Observation Level : q15 minute checks  -- Vital signs:  q12 hours  -- Precautions: suicide, elopement, and assault -- Encouraged patient to participate in unit milieu and in scheduled group therapies  2. Psychiatric Treatment:  Scheduled Medications:    Risperdal  1 mg twice daily-plan to titrated up to 2 mg twice daily Duloxetine  30 mg daily Remeron  15 mg nightly -- The risks/benefits/side-effects/alternatives to this medication were discussed in detail with the patient and time was given for questions. The patient consents to medication trial.  3. Medical Issues Being Addressed:     4. Discharge Planning:   -- Social work and case management to assist with discharge planning and identification of hospital follow-up needs prior to discharge  -- Estimated LOS: 3-4 days  Raniyah Curenton, MD 11/27/2024, 11:16 PM

## 2024-11-27 NOTE — Plan of Care (Signed)
" °  Problem: Education: Goal: Ability to state activities that reduce stress will improve Outcome: Progressing   Problem: Coping: Goal: Ability to identify and develop effective coping behavior will improve Outcome: Progressing   Problem: Self-Concept: Goal: Ability to identify factors that promote anxiety will improve Outcome: Progressing Goal: Level of anxiety will decrease Outcome: Progressing Goal: Ability to modify response to factors that promote anxiety will improve Outcome: Progressing   Problem: Education: Goal: Utilization of techniques to improve thought processes will improve Outcome: Progressing Goal: Knowledge of the prescribed therapeutic regimen will improve Outcome: Progressing   Problem: Activity: Goal: Interest or engagement in leisure activities will improve Outcome: Progressing Goal: Imbalance in normal sleep/wake cycle will improve Outcome: Progressing   Problem: Coping: Goal: Coping ability will improve Outcome: Progressing Goal: Will verbalize feelings Outcome: Progressing   Problem: Health Behavior/Discharge Planning: Goal: Ability to make decisions will improve Outcome: Progressing Goal: Compliance with therapeutic regimen will improve Outcome: Progressing   Problem: Role Relationship: Goal: Will demonstrate positive changes in social behaviors and relationships Outcome: Progressing   Problem: Safety: Goal: Ability to disclose and discuss suicidal ideas will improve Outcome: Progressing Goal: Ability to identify and utilize support systems that promote safety will improve Outcome: Progressing   Problem: Self-Concept: Goal: Will verbalize positive feelings about self Outcome: Progressing Goal: Level of anxiety will decrease Outcome: Progressing   Problem: Education: Goal: Knowledge of [place] Education information/materials will improve Outcome: Progressing Goal: Emotional status will improve Outcome: Progressing Goal:  Mental status will improve Outcome: Progressing Goal: Verbalization of understanding the information provided will improve Outcome: Progressing   Problem: Activity: Goal: Interest or engagement in activities will improve Outcome: Progressing Goal: Sleeping patterns will improve Outcome: Progressing   Problem: Coping: Goal: Ability to verbalize frustrations and anger appropriately will improve Outcome: Progressing Goal: Ability to demonstrate self-control will improve Outcome: Progressing   Problem: Health Behavior/Discharge Planning: Goal: Identification of resources available to assist in meeting health care needs will improve Outcome: Progressing Goal: Compliance with treatment plan for underlying cause of condition will improve Outcome: Progressing   Problem: Physical Regulation: Goal: Ability to maintain clinical measurements within normal limits will improve Outcome: Progressing   Problem: Safety: Goal: Periods of time without injury will increase Outcome: Progressing   Problem: Activity: Goal: Will verbalize the importance of balancing activity with adequate rest periods Outcome: Progressing   Problem: Education: Goal: Will be free of psychotic symptoms Outcome: Progressing Goal: Knowledge of the prescribed therapeutic regimen will improve Outcome: Progressing   Problem: Coping: Goal: Coping ability will improve Outcome: Progressing Goal: Will verbalize feelings Outcome: Progressing   Problem: Health Behavior/Discharge Planning: Goal: Compliance with prescribed medication regimen will improve Outcome: Progressing   Problem: Nutritional: Goal: Ability to achieve adequate nutritional intake will improve Outcome: Progressing   Problem: Role Relationship: Goal: Ability to communicate needs accurately will improve Outcome: Progressing Goal: Ability to interact with others will improve Outcome: Progressing   Problem: Safety: Goal: Ability to redirect  hostility and anger into socially appropriate behaviors will improve Outcome: Progressing Goal: Ability to remain free from injury will improve Outcome: Progressing   Problem: Self-Care: Goal: Ability to participate in self-care as condition permits will improve Outcome: Progressing   Problem: Self-Concept: Goal: Will verbalize positive feelings about self Outcome: Progressing   "

## 2024-11-27 NOTE — Group Note (Signed)
 Physical/Occupational Therapy Group Note  Group Topic: Functional, Dynamic Balance   Group Date: 11/27/2024 Start Time: 1300 End Time: 1330 Facilitators: Crystian Frith, Alm Hamilton, PT   Group Description: Group discussed impact of balance on safety and independence with functional tasks.  Identified and discussed any self-perceived balance deficits to personalize information.  Discussed and reviewed strategies to address/improve balance deficits: use of assist devices, activity pacing/energy conservation, environment/home safety modifications, focusing attention/minimizing distraction.  Reviewed and participated with standing LE therex designed to target dynamic balance reactions and LE strength/stability; provided handouts with HEP to be utilized outside of group time as appropriate.  Allowed time for questions and further discussion on any balance or mobility concerns/needs.  Therapeutic Goal(s):  Identify and discuss any individual balance deficits and functional implications. Identify and discuss any environmental/home safety modifications that can optimize balance and safety for mobility within the home. Demonstrate understanding and performance of standing therex designed to target dynamic balance deficits.  Individual Participation: Did not attend  Participation Level:   Participation Quality:   Behavior:   Speech/Thought Process:   Affect/Mood:   Insight:   Judgement:   Modes of Intervention:   Patient Response to Interventions:    Plan: Continue to engage patient in PT/OT groups 1 - 2x/week.  CHARM Hamilton Bertin PT, DPT 11/27/2024, 1:46 PM

## 2024-11-27 NOTE — Group Note (Signed)
 Date:  11/27/2024 Time:  8:52 PM  Group Topic/Focus:  Wrap-Up Group:   The focus of this group is to help patients review their daily goal of treatment and discuss progress on daily workbooks.    Participation Level:  Active  Participation Quality:  Appropriate  Affect:  Appropriate  Cognitive:  Alert  Insight: Appropriate  Engagement in Group:  Engaged  Modes of Intervention:  Discussion  Additional Comments:    Tommas CHRISTELLA Bunker 11/27/2024, 8:52 PM

## 2024-11-27 NOTE — Group Note (Signed)
 Date:  11/27/2024 Time:  12:03 PM  Group Topic/Focus:  Goals Group:   The focus of this group is to help patients establish daily goals to achieve during treatment and discuss how the patient can incorporate goal setting into their daily lives to aide in recovery.    Participation Level:  Minimal  Participation Quality:  Drowsy and Resistant  Affect:  Lethargic  Cognitive:  Lacking  Insight: Lacking and Limited  Engagement in Group:  Limited and Resistant  Modes of Intervention:  Activity and Discussion  Additional Comments:  pt slept during group sitting at the table    Maglione,Altair Appenzeller E 11/27/2024, 12:03 PM

## 2024-11-27 NOTE — BH IP Treatment Plan (Signed)
 Interdisciplinary Treatment and Diagnostic Plan Update  11/27/2024 Time of Session: 11:01 AM  Sarah Sullivan MRN: [id number]  Principal Diagnosis: Psychosis [facility])  Secondary Diagnoses: Principal Problem:   Psychosis (HCC)   Current Medications:  Current Facility-Administered Medications  Medication Dose Route Frequency Provider Last Rate Last Admin   acetaminophen  (TYLENOL ) tablet 650 mg  650 mg Oral Q6H PRN McLauchlin, Jon, NP   650 mg at 11/24/24 2338   alum & mag hydroxide-simeth (MAALOX/MYLANTA) 200-200-20 MG/5ML suspension 30 mL  30 mL Oral Q4H PRN McLauchlin, Angela, NP       atorvastatin  (LIPITOR ) tablet 80 mg  80 mg Oral Daily Madaram, Kondal R, MD   80 mg at 11/27/24 0920   DULoxetine  (CYMBALTA ) DR capsule 30 mg  30 mg Oral Daily Madaram, Kondal R, MD   30 mg at 11/27/24 0923   feeding supplement (ENSURE PLUS HIGH PROTEIN) liquid 237 mL  237 mL Oral BID BM Jadapalle, Sree, MD   237 mL at 11/27/24 1000   felodipine  (PLENDIL ) 24 hr tablet 10 mg  10 mg Oral Daily Madaram, Kondal R, MD   10 mg at 11/26/24 1156   gabapentin  (NEURONTIN ) capsule 300 mg  300 mg Oral BID Elesa Perkins, RPH   300 mg at 11/27/24 1343   And   gabapentin  (NEURONTIN ) capsule 600 mg  600 mg Oral QHS Elesa Perkins, RPH   600 mg at 11/26/24 2112   irbesartan  (AVAPRO ) tablet 75 mg  75 mg Oral Daily Madaram, Kondal R, MD   75 mg at 11/27/24 9077   magnesium  hydroxide (MILK OF MAGNESIA) suspension 30 mL  30 mL Oral Daily PRN McLauchlin, Angela, NP       mirtazapine  (REMERON ) tablet 15 mg  15 mg Oral QHS Madaram, Kondal R, MD   15 mg at 11/26/24 2112   OLANZapine  zydis (ZYPREXA ) disintegrating tablet 5 mg  5 mg Oral TID PRN McLauchlin, Angela, NP   5 mg at 11/25/24 2318   pantoprazole  (PROTONIX ) EC tablet 40 mg  40 mg Oral BID AC Madaram, Kondal R, MD   40 mg at 11/27/24 9078   risperiDONE  (RISPERDAL ) tablet 1 mg  1 mg Oral BID Madaram, Kondal R, MD   1 mg at 11/27/24 9077   traZODone  (DESYREL ) tablet 50 mg   50 mg Oral QHS PRN McLauchlin, Angela, NP   50 mg at 11/26/24 2112   PTA Medications: Medications Prior to Admission  Medication Sig Dispense Refill Last Dose/Taking   atorvastatin  (LIPITOR ) 80 MG tablet Take 80 mg by mouth daily.      DULoxetine  (CYMBALTA ) 30 MG capsule Take 30 mg by mouth daily.      felodipine  (PLENDIL ) 10 MG 24 hr tablet Take 10 mg by mouth daily.      gabapentin  (NEURONTIN ) 300 MG capsule Take 300-600 mg by mouth 3 (three) times daily. Take 300 mg (one capsule) by mouth twice daily and 600 mg (two capsules) at bedtime      mirtazapine  (REMERON ) 7.5 MG tablet Take 7.5 mg by mouth at bedtime.      pantoprazole  (PROTONIX ) 40 MG tablet Take 1 tablet (40 mg total) by mouth 2 (two) times daily before a meal. 120 tablet 0    sertraline (ZOLOFT) 100 MG tablet Take 100 mg by mouth daily.      telmisartan (MICARDIS) 40 MG tablet Take 40 mg by mouth daily.      traZODone  (DESYREL ) 50 MG tablet Take 50 mg by mouth at bedtime.  Patient Stressors:    Patient Strengths:    Treatment Modalities: Medication Management, Group therapy, Case management,  1 to 1 session with clinician, Psychoeducation, Recreational therapy.   Physician Treatment Plan for Primary Diagnosis: Psychosis (HCC) Long Term Goal(s):     Short Term Goals:    Medication Management: Evaluate patient's response, side effects, and tolerance of medication regimen.  Therapeutic Interventions: 1 to 1 sessions, Unit Group sessions and Medication administration.  Evaluation of Outcomes: Not Progressing  Physician Treatment Plan for Secondary Diagnosis: Principal Problem:   Psychosis (HCC)  Long Term Goal(s):     Short Term Goals:       Medication Management: Evaluate patient's response, side effects, and tolerance of medication regimen.  Therapeutic Interventions: 1 to 1 sessions, Unit Group sessions and Medication administration.  Evaluation of Outcomes: Not Progressing   RN Treatment Plan for  Primary Diagnosis: Psychosis (HCC) Long Term Goal(s): Knowledge of disease and therapeutic regimen to maintain health will improve  Short Term Goals: Ability to remain free from injury will improve, Ability to verbalize frustration and anger appropriately will improve, Ability to demonstrate self-control, Ability to participate in decision making will improve, Ability to verbalize feelings will improve, Ability to disclose and discuss suicidal ideas, Ability to identify and develop effective coping behaviors will improve, and Compliance with prescribed medications will improve  Medication Management: RN will administer medications as ordered by provider, will assess and evaluate patient's response and provide education to patient for prescribed medication. RN will report any adverse and/or side effects to prescribing provider.  Therapeutic Interventions: 1 on 1 counseling sessions, Psychoeducation, Medication administration, Evaluate responses to treatment, Monitor vital signs and CBGs as ordered, Perform/monitor CIWA, COWS, AIMS and Fall Risk screenings as ordered, Perform wound care treatments as ordered.  Evaluation of Outcomes: Not Progressing   LCSW Treatment Plan for Primary Diagnosis: Psychosis (HCC) Long Term Goal(s): Safe transition to appropriate next level of care at discharge, Engage patient in therapeutic group addressing interpersonal concerns.  Short Term Goals: Engage patient in aftercare planning with referrals and resources, Increase social support, Increase ability to appropriately verbalize feelings, Increase emotional regulation, Facilitate acceptance of mental health diagnosis and concerns, Facilitate patient progression through stages of change regarding substance use diagnoses and concerns, Identify triggers associated with mental health/substance abuse issues, and Increase skills for wellness and recovery  Therapeutic Interventions: Assess for all discharge needs, 1 to 1  time with Social worker, Explore available resources and support systems, Assess for adequacy in community support network, Educate family and significant other(s) on suicide prevention, Complete Psychosocial Assessment, Interpersonal group therapy.  Evaluation of Outcomes: Not Progressing   Progress in Treatment: Attending groups: Yes. and No. Participating in groups: Yes. and No. Taking medication as prescribed: Yes. Toleration medication: Yes. Family/Significant other contact made: No, will contact:  CSW will contact pt's sister (pt gave verbal permission to speak with her sister during treatment team Patient understands diagnosis: No. Discussing patient identified problems/goals with staff: Yes. Medical problems stabilized or resolved: Yes. Denies suicidal/homicidal ideation: Yes. Issues/concerns per patient self-inventory: No. Other: None   New problem(s) identified: No, Describe:  None identified   New Short Term/Long Term Goal(s):  elimination of symptoms of psychosis, medication management for mood stabilization; elimination of SI thoughts; development of comprehensive mental wellness plan.   Patient Goals:  Pt reports she is hearing voices and reports she does not want to, pt has difficulty with speech   Discharge Plan or Barriers: CSW will assist with  appropriate discharge planning   Reason for Continuation of Hospitalization: Hallucinations Medication stabilization  Estimated Length of Stay: 1 to 7 days  Last 3 [place] Suicide Severity Risk Score: Flowsheet Row Admission (Current) from 11/24/2024 in [facility]AL [facility] Most recent reading at 11/25/2024 10:00 AM ED from 11/24/2024 in [facility] Emergency Department at [facility] Most recent reading at 11/24/2024  2:27 PM  C-SSRS RISK CATEGORY No Risk No Risk    Last PHQ 2/9 Scores:     No data to display          Scribe for Treatment Team: Lum JONETTA Croft, [place] 11/27/2024 3:41 PM

## 2024-11-28 DIAGNOSIS — F29 Unspecified psychosis not due to a substance or known physiological condition: Secondary | ICD-10-CM | POA: Diagnosis not present

## 2024-11-28 MED ORDER — DULOXETINE HCL 60 MG PO CPEP
60.0000 mg | ORAL_CAPSULE | Freq: Every day | ORAL | Status: DC
  Administered 2024-11-29 – 2024-12-08 (×10): 60 mg via ORAL
  Filled 2024-11-28 (×10): qty 1

## 2024-11-28 MED ORDER — THIAMINE MONONITRATE 100 MG PO TABS
100.0000 mg | ORAL_TABLET | Freq: Every day | ORAL | Status: AC
  Administered 2024-11-29 – 2024-12-04 (×6): 100 mg via ORAL
  Filled 2024-11-28 (×6): qty 1

## 2024-11-28 MED ORDER — ADULT MULTIVITAMIN W/MINERALS CH
1.0000 | ORAL_TABLET | Freq: Every day | ORAL | Status: DC
  Administered 2024-11-28 – 2024-12-08 (×11): 1 via ORAL
  Filled 2024-11-28 (×11): qty 1

## 2024-11-28 MED ORDER — ENSURE PLUS HIGH PROTEIN PO LIQD
237.0000 mL | Freq: Three times a day (TID) | ORAL | Status: DC
  Administered 2024-11-28 – 2024-12-08 (×27): 237 mL via ORAL

## 2024-11-28 MED ORDER — THIAMINE MONONITRATE 100 MG PO TABS: 100.0000 mg | ORAL_TABLET | Freq: Every day | ORAL | Status: DC

## 2024-11-28 NOTE — Group Note (Signed)
 Recreation Therapy Group Note   Group Topic:Animal Assisted Therapy   Group Date: 11/28/2024 Start Time: 1020 End Time: 1045 Facilitators: Celestia Jeoffrey BRAVO, LRT, CTRS Location: Dayroom  Group Description: AAA. Animal-Assisted Activity provides opportunities for motivational, educational, therapeutic and/or recreational benefits to enhance quality of life. Selinda and Rollo visited the unit to interact with patients.   Goal Areas Addressed:  Reduced anxiety and stress Improved mood Increased social interaction Enhanced communication skills Reduced loneliness and isolation Improved emotional regulation   Affect/Mood: Appropriate   Participation Level: Non-verbal    Clinical Observations/Individualized Feedback: Sarah Sullivan was present in the dayroom during group. Pt did not interact with LRT or peers while present, however, did pet Rollo.   Plan: Continue to engage patient in RT group sessions 2-3x/week.   Jeoffrey BRAVO Celestia, LRT, CTRS 11/28/2024 11:53 AM

## 2024-11-28 NOTE — BHH Counselor (Signed)
 CSW contacted pt's sister-in-law, [name], [phone]  with provider, per provider request.   Provider gave clinical updates about pt's progression.  [name] reports that pt lives a lone.   Reports pt began hearing voices in the evening time, reports that pt became paranoid about her neighbors trying to kill her. Reports during the day pt is fine, reports she is fine when she is around people. Pt's sister-in-law reports that this began 5 to 6 weeks ago.    Pt's sister-in-law states that pt can come live with her following discharge.   CSW and provider will call pt's sister with updates regarding clinical progression and discharge.   [name], MSW, [place] 11/29/2024 9:09 AM

## 2024-11-28 NOTE — Group Note (Signed)
 Date:  11/28/2024 Time:  9:03 PM  Group Topic/Focus:  Wrap-Up Group:   The focus of this group is to help patients review their daily goal of treatment and discuss progress on daily workbooks.    Participation Level:  Active  Participation Quality:  Attentive  Affect:  Appropriate  Cognitive:  Alert  Insight: Appropriate  Engagement in Group:  Engaged  Modes of Intervention:  Discussion  Additional Comments:    Cecilio GORMAN Getting 11/28/2024, 9:03 PM

## 2024-11-28 NOTE — Plan of Care (Signed)
" °  Problem: Coping: Goal: Ability to identify and develop effective coping behavior will improve 11/28/2024 0445 by Heyward Taunya GRADE, RN Outcome: Progressing 11/28/2024 0444 by Heyward Taunya GRADE, RN Outcome: Progressing   Problem: Self-Concept: Goal: Level of anxiety will decrease 11/28/2024 0445 by Heyward Taunya GRADE, RN Outcome: Progressing 11/28/2024 0444 by Heyward Taunya GRADE, RN Outcome: Progressing   Problem: Activity: Goal: Interest or engagement in leisure activities will improve 11/28/2024 0445 by Heyward Taunya GRADE, RN Outcome: Progressing 11/28/2024 0444 by Heyward Taunya GRADE, RN Outcome: Progressing Goal: Imbalance in normal sleep/wake cycle will improve 11/28/2024 0445 by Heyward Taunya GRADE, RN Outcome: Progressing 11/28/2024 0444 by Heyward Taunya GRADE, RN Outcome: Progressing   "

## 2024-11-28 NOTE — Group Note (Signed)
 LCSW Group Therapy Note  Group Date: 11/28/2024 Start Time: 1330 End Time: 1400   Type of Therapy and Topic:  Group Therapy - Healthy vs Unhealthy Coping Skills  Participation Level:  None   Description of Group The focus of this group was to determine what unhealthy coping techniques typically are used by group members and what healthy coping techniques would be helpful in coping with various problems. Patients were guided in becoming aware of the differences between healthy and unhealthy coping techniques. Patients were asked to identify 2-3 healthy coping skills they would like to learn to use more effectively.  Therapeutic Goals Patients learned that coping is what human beings do all day long to deal with various situations in their lives Patients defined and discussed healthy vs unhealthy coping techniques Patients identified their preferred coping techniques and identified whether these were healthy or unhealthy Patients determined 2-3 healthy coping skills they would like to become more familiar with and use more often. Patients provided support and ideas to each other   Summary of Patient Progress:  Patient demonstrated fair insight into the subject matter, was respectful of peers, and participated throughout the entire session.   Therapeutic Modalities Cognitive Behavioral Therapy Motivational Interviewing  Lum JONETTA Croft, [place] 11/28/2024  2:21 PM

## 2024-11-28 NOTE — Group Note (Signed)
 Date:  11/28/2024 Time:  3:59 PM  Group Topic/Focus:  Making Healthy Choices:   The focus of this group is to help patients identify negative/unhealthy choices they were using prior to admission and identify positive/healthier coping strategies to replace them upon discharge.    Participation Level:  Did Not Attend   Sarah Sullivan 11/28/2024, 3:59 PM

## 2024-11-28 NOTE — Progress Notes (Signed)
 Patient was cooperative and used medications without issues. Endorsed auditory hallucinations. Denies SI/HI/AVH. Worsening confusion continues. 1:1 order in place overnight. Slept form about 5hrs.    11/27/24 2100  Psych Admission Type (Psych Patients Only)  Admission Status Voluntary  Psychosocial Assessment  Patient Complaints None  Eye Contact Fair  Facial Expression Animated  Affect Preoccupied  Speech Rapid  Interaction Assertive  Motor Activity Fidgety  Appearance/Hygiene In scrubs  Behavior Characteristics Cooperative  Mood Pleasant  Thought Process  Coherency Disorganized  Content Delusions  Delusions Paranoid  Perception Hallucinations  Hallucination Auditory  Judgment Impaired  Confusion Mild  Danger to Self  Current suicidal ideation? Denies  Agreement Not to Harm Self Yes  Description of Agreement verbal  Danger to Others  Danger to Others None reported or observed

## 2024-11-28 NOTE — Progress Notes (Signed)
 NUTRITION ASSESSMENT  RD consulted for assessment of nutrition requirements/ status.   INTERVENTION:  -Downgrade diet to dysphagia 3 diet for ease of intake -MVI with minerals daily -Ensure Plus High Protein po TID, each supplement provides 350 kcal and 20 grams of protein  -Magic cup TID with meals, each supplement provides 290 kcal and 9 grams of protein  -100 mg thiamine  daily x 7 days  NUTRITION DIAGNOSIS: Unintentional weight loss related to sub-optimal intake as evidenced by pt report.   Goal: Pt to meet >/= 90% of their estimated nutrition needs.  Monitor:  PO intake  Assessment:  79 year old female with a past psychiatric history of depression and mood disturbances, presenting to the ED for worsening auditory hallucinations. She reports hearing voices telling her that people are trying to harm her, and she has been increasingly paranoid.   Patient admitted with auditory hallucinations and paranoia.   Per H&P, patient has been experiencing auditory hallucinations and paranoid within the past month.   Patient sitting in the dayroom, somnolent and watching Western movie. Patient mostly answered close ended questions, but kept her eye closed throughout visit. She was able to confirm that she is not eating much and she has ill-fitting dentures. She is unable to provide additional history. She is unable to confirm if softer textured foods will help.   Case discussed with RN, who reports patient has access to dentures, but are ill fitting. She has been consuming foods, such as oatmeal, mashed potatoes, and mac and cheese. She did accept a little of an Ensure supplement this morning. Rn agrees that patient would likely not accept puree foods and agrees dysphagia 3 (mechanical soft) diet would benefit patient.   Per CareEverywhere, patient was 91# on 08/28/24. Patient has experienced a 7% weight loss over the past 3 months,. While this is not significant for time frame, it is concerning  given underweight status and poor oral intake. Per exam, patient also meets criteria for severe malnutrition in the setting of social and environmental circumstances as evidenced by moderate to severe fat and muscle depletions. She is also at high refeeding risk; will add thiamine .   Labs reviewed.    Flowsheet Row Most Recent Value  Orbital Region Severe depletion  Upper Arm Region Moderate depletion  Thoracic and Lumbar Region Moderate depletion  Buccal Region Severe depletion  Temple Region Severe depletion  Clavicle Bone Region Severe depletion  Clavicle and Acromion Bone Region Severe depletion  Scapular Bone Region Severe depletion  Dorsal Hand Moderate depletion  Patellar Region Severe depletion  Anterior Thigh Region Severe depletion  Posterior Calf Region Severe depletion  Edema (RD Assessment) None  Hair Reviewed  Eyes Reviewed  Mouth Reviewed  Skin Reviewed  Nails Reviewed    79 y.o. female  Height: Ht Readings from Last 1 Encounters:  11/24/24 4' 10 (1.473 m)    Weight: Wt Readings from Last 1 Encounters:  11/24/24 38.8 kg    Weight Hx: Wt Readings from Last 10 Encounters:  11/24/24 38.8 kg  11/24/24 52.2 kg  11/08/19 52.2 kg  04/26/19 57.4 kg    BMI:  Body mass index is 17.87 kg/m. Pt meets criteria for underweight based on current BMI.  Estimated Nutritional Needs: Kcal: 25-30 kcal/kg Protein: > 1 gram protein/kg Fluid: 1 ml/kcal  Diet Order:  Diet Order             DIET DYS 3 Fluid consistency: Thin  Diet effective now  Pt is also offered choice of unit snacks mid-morning and mid-afternoon.  Pt is eating as desired.   Lab results and medications reviewed.   Margery ORN, RD, LDN, CDCES Registered Dietitian III Certified Diabetes Care and Education Specialist If unable to reach this RD, please use RD Inpatient group chat on secure chat between hours of 8am-4 pm daily

## 2024-11-28 NOTE — Progress Notes (Signed)
(  Sleep Hours) - (Any PRNs that were needed, meds refused, or side effects to meds)- None needed or requested (Any disturbances and when (visitation, over night)-None reported or observed (Concerns raised by the patient)- None reported or observed (SI/HI/AVH)- Denies

## 2024-11-28 NOTE — Progress Notes (Signed)
 [facility] MD Progress Note  11/28/2024 8:00 PM Roni Friberg  MRN:  [id number] Ms. Pilson is a 79 year old female with a past psychiatric history of depression and mood disturbances, presenting to the ED for worsening auditory hallucinations. She reports hearing voices telling her that people are trying to harm her, and she has been increasingly paranoid. She describes having to whisper while speaking and being hypervigilant at home, checking windows every 15 minutes.Patient is admitted to [facility] unit with Q15 min safety monitoring. Multidisciplinary team approach is offered. Medication management; group/milieu therapy is offered.   Subjective:  Chart reviewed, case discussed in multidisciplinary meeting, patient seen during rounds.   Patient is noted to be sitting in the day area.  She continues to have slurred speech due to dentures.  She reports able to eat some soft diet.  She initially reports the voices have gone down and when asked about suicidal thoughts she reports ongoing suicidal thoughts due to the voices.  Patient denies any visual hallucinations.  She reports fair sleep.  Provider and social worker reached out to patient's family-they informed the team about abrupt onset of psychosis in the last few weeks and change in personality lately.  They informed that patient was independent with no mental health history till now.  Provider and the family discussed the possibility of underlying dementia and also discussed PT OT referrals to evaluate the appropriate level of living conditions.  Past Psychiatric History: see h&P Family History:  Family History  Problem Relation Age of Onset   Breast cancer Neg Hx    Social History:  Social History   Substance and Sexual Activity  Alcohol Use Not Currently     Social History   Substance and Sexual Activity  Drug Use Not Currently    Social History   Socioeconomic History   Marital status: Married    Spouse name: Not on file   Number  of children: Not on file   Years of education: Not on file   Highest education level: Not on file  Occupational History   Not on file  Tobacco Use   Smoking status: Never   Smokeless tobacco: Never  Substance and Sexual Activity   Alcohol use: Not Currently   Drug use: Not Currently   Sexual activity: Not on file  Other Topics Concern   Not on file  Social History Narrative   Not on file   Social Drivers of Health   Tobacco Use: Low Risk (11/25/2024)   Patient History    Smoking Tobacco Use: Never    Smokeless Tobacco Use: Never    Passive Exposure: Not on file  Financial Resource Strain: Low Risk  (10/18/2024)   Received from [facility]   Overall Financial Resource Strain (CARDIA)    Difficulty of Paying Living Expenses: Not hard at all  Food Insecurity: No Food Insecurity (11/25/2024)   Epic    Worried About Radiation Protection Practitioner of Food in the Last Year: Never true    Ran Out of Food in the Last Year: Never true  Transportation Needs: No Transportation Needs (11/25/2024)   Epic    Lack of Transportation (Medical): No    Lack of Transportation (Non-Medical): No  Physical Activity: Not on file  Stress: Not on file  Social Connections: Socially Isolated (11/25/2024)   Social Connection and Isolation Panel    Frequency of Communication with Friends and Family: Never    Frequency of Social Gatherings with Friends and Family: Never  Attends Religious Services: Never    Active Member of Clubs or Organizations: No    Attends Banker Meetings: Never    Marital Status: Widowed  Depression (PHQ2-9): Not on file  Alcohol Screen: Low Risk (11/24/2024)   Alcohol Screen    Last Alcohol Screening Score (AUDIT): 0  Housing: Low Risk (11/25/2024)   Epic    Unable to Pay for Housing in the Last Year: No    Number of Times Moved in the Last Year: 0    Homeless in the Last Year: No  Utilities: Not At Risk (11/25/2024)   Epic    Threatened with loss of utilities: No   Health Literacy: Not on file   Past Medical History:  Past Medical History:  Diagnosis Date   Anemia    Hypertension     Past Surgical History:  Procedure Laterality Date   ABDOMINAL HYSTERECTOMY     COLONOSCOPY N/A 04/26/2019   Procedure: COLONOSCOPY;  Surgeon: Unk Corinn Skiff, MD;  Location: [facility] ENDOSCOPY;  Service: Gastroenterology;  Laterality: N/A;   COLONOSCOPY N/A 04/27/2019   Procedure: COLONOSCOPY;  Surgeon: Unk Corinn Skiff, MD;  Location: [facility] ENDOSCOPY;  Service: Gastroenterology;  Laterality: N/A;   ESOPHAGOGASTRODUODENOSCOPY N/A 04/25/2019   Procedure: ESOPHAGOGASTRODUODENOSCOPY (EGD);  Surgeon: Unk Corinn Skiff, MD;  Location: [facility] ENDOSCOPY;  Service: Gastroenterology;  Laterality: N/A;   ESOPHAGOGASTRODUODENOSCOPY N/A 04/26/2019   Procedure: ESOPHAGOGASTRODUODENOSCOPY (EGD);  Surgeon: Unk Corinn Skiff, MD;  Location: [facility] ENDOSCOPY;  Service: Gastroenterology;  Laterality: N/A;   TEMPORARY DIALYSIS CATHETER N/A 04/21/2019   Procedure: TEMPORARY DIALYSIS CATHETER;  Surgeon: Marea Selinda RAMAN, MD;  Location: [facility] INVASIVE CV LAB;  Service: Cardiovascular;  Laterality: N/A;    Current Medications: Current Facility-Administered Medications  Medication Dose Route Frequency Provider Last Rate Last Admin   acetaminophen  (TYLENOL ) tablet 650 mg  650 mg Oral Q6H PRN McLauchlin, Angela, NP   650 mg at 11/24/24 2338   alum & mag hydroxide-simeth (MAALOX/MYLANTA) 200-200-20 MG/5ML suspension 30 mL  30 mL Oral Q4H PRN McLauchlin, Angela, NP       atorvastatin  (LIPITOR ) tablet 80 mg  80 mg Oral Daily Madaram, Kondal R, MD   80 mg at 11/28/24 0826   DULoxetine  (CYMBALTA ) DR capsule 30 mg  30 mg Oral Daily Madaram, Kondal R, MD   30 mg at 11/28/24 0826   feeding supplement (ENSURE PLUS HIGH PROTEIN) liquid 237 mL  237 mL Oral TID BM Mateen Franssen, MD       felodipine  (PLENDIL ) 24 hr tablet 10 mg  10 mg Oral Daily Madaram, Kondal R, MD   10 mg at 11/28/24 9175   gabapentin   (NEURONTIN ) capsule 300 mg  300 mg Oral BID Elesa Perkins, RPH   300 mg at 11/28/24 1353   And   gabapentin  (NEURONTIN ) capsule 600 mg  600 mg Oral QHS Elesa Perkins, RPH   600 mg at 11/27/24 2052   irbesartan  (AVAPRO ) tablet 75 mg  75 mg Oral Daily Madaram, Kondal R, MD   75 mg at 11/28/24 0825   magnesium  hydroxide (MILK OF MAGNESIA) suspension 30 mL  30 mL Oral Daily PRN McLauchlin, Angela, NP       mirtazapine  (REMERON ) tablet 15 mg  15 mg Oral QHS Madaram, Kondal R, MD   15 mg at 11/27/24 2049   multivitamin with minerals tablet 1 tablet  1 tablet Oral Daily Shondrea Steinert, MD   1 tablet at 11/28/24 1352   OLANZapine  zydis (ZYPREXA ) disintegrating tablet 5  mg  5 mg Oral TID PRN McLauchlin, Angela, NP   5 mg at 11/25/24 2318   pantoprazole  (PROTONIX ) EC tablet 40 mg  40 mg Oral BID AC Madaram, Kondal R, MD   40 mg at 11/28/24 1700   risperiDONE  (RISPERDAL ) tablet 2 mg  2 mg Oral BID Kynzley Dowson, MD   2 mg at 11/28/24 0828   [START ON 11/29/2024] thiamine  (VITAMIN B1) tablet 100 mg  100 mg Oral Daily Hayzen Lorenson, MD       traZODone  (DESYREL ) tablet 50 mg  50 mg Oral QHS PRN McLauchlin, Angela, NP   50 mg at 11/27/24 2049    Lab Results: No results found for this or any previous visit (from the past 48 hours).  Blood Alcohol level:  Lab Results  Component Value Date   [facility] <15 11/24/2024    Metabolic Disorder Labs: Lab Results  Component Value Date   HGBA1C 5.3 04/29/2019   MPG 105.41 04/29/2019   No results found for: PROLACTIN Lab Results  Component Value Date   CHOL 175 04/29/2019   TRIG 93 04/29/2019   HDL 68 04/29/2019   CHOLHDL 2.6 04/29/2019   VLDL 19 04/29/2019   LDLCALC 88 04/29/2019    Physical Findings: AIMS:  , ,  ,  ,    CIWA:    COWS:      Psychiatric Specialty Exam:  Presentation  General Appearance: Bizarre  Eye Contact:Fleeting  Speech:Slow; Slurred  Speech Volume:Decreased    Mood and Affect   Mood:Anxious  Affect:Blunt   Thought Process  Thought Processes:Disorganized  Orientation:Partial  Thought Content:Illogical; Paranoid Ideation; Delusions  Hallucinations:Hallucinations: Auditory; Command  Ideas of Reference: Denies Suicidal Thoughts: Reports passive SI Homicidal Thoughts:Homicidal Thoughts: No   Sensorium  Memory:Immediate Fair; Remote Fair  Judgment:Impaired  Insight:Shallow   Executive Functions  Concentration:Poor  Attention Span:Fair  Recall:Fair  Fund of Knowledge:Fair  Language:Fair   Psychomotor Activity  Psychomotor Activity:Psychomotor Activity: Normal  Musculoskeletal: Strength & Muscle Tone: decreased Gait & Station: unsteady Assets  Assets:No data recorded   Physical Exam: Physical Exam Vitals and nursing note reviewed.    ROS Blood pressure 132/66, pulse 94, temperature 98 F (36.7 C), temperature source Oral, resp. rate 16, height 4' 10 (1.473 m), weight 38.8 kg, SpO2 100%. Body mass index is 17.87 kg/m.  Diagnosis: Principal Problem:   Psychosis (HCC)   PLAN: Safety and Monitoring:  -- Voluntary admission to inpatient psychiatric unit for safety, stabilization and treatment  -- Daily contact with patient to assess and evaluate symptoms and progress in treatment  -- Patient's case to be discussed in multi-disciplinary team meeting  -- Observation Level : q15 minute checks  -- Vital signs:  q12 hours  -- Precautions: suicide, elopement, and assault -- Encouraged patient to participate in unit milieu and in scheduled group therapies  2. Psychiatric Treatment:  Scheduled Medications:    Risperdal  1 mg twice daily-plan to titrated up to 2 mg twice daily Duloxetine  30 mg daily-increase to 60 mg daily Remeron  15 mg nightly -- The risks/benefits/side-effects/alternatives to this medication were discussed in detail with the patient and time was given for questions. The patient consents to medication trial.  3.  Medical Issues Being Addressed:     4. Discharge Planning:   -- Social work and case management to assist with discharge planning and identification of hospital follow-up needs prior to discharge  -- Estimated LOS: 3-4 days  Ceylon Arenson, MD 11/28/2024, 8:00 PM

## 2024-11-28 NOTE — Progress Notes (Signed)
 Patient is a voluntary admission to [facility] with a hx of depression and mood disturbances admitted for worsening auditory hallucinations. Patient states she is afraid to be alone because the voices scare her. She spends her time in the dayroom during the day.  Is calm and cooperative and takes her pills whole. She uses a walker for ambulation stating she has leg pain.  Patient needs help filling out her menu as she has trouble reading and writing english. Patient had a dietitian d/t her small stature and having trouble eating d/t ill fitting dentures.  Will continue to monitor.

## 2024-11-29 DIAGNOSIS — F29 Unspecified psychosis not due to a substance or known physiological condition: Secondary | ICD-10-CM | POA: Diagnosis not present

## 2024-11-29 NOTE — Progress Notes (Signed)
 [facility] MD Progress Note  11/29/2024 12:28 PM Sarah Sullivan  MRN:  [id number] Sarah Sullivan is a 79 year old female with a past psychiatric history of depression and mood disturbances, presenting to the ED for worsening auditory hallucinations. She reports hearing voices telling her that people are trying to harm her, and she has been increasingly paranoid. She describes having to whisper while speaking and being hypervigilant at home, checking windows every 15 minutes.Patient is admitted to [facility] unit with Q15 min safety monitoring. Multidisciplinary team approach is offered. Medication management; group/milieu therapy is offered.  11/29/23: Collaterals:Provider and social worker reached out to patient's family-they informed the team about abrupt onset of psychosis in the last few weeks and change in personality lately.  They informed that patient was independent with no mental health history till now.  Provider and the family discussed the possibility of underlying dementia and also discussed PT OT referrals to evaluate the appropriate level of living conditions. Subjective:  Chart reviewed, case discussed in multidisciplinary meeting, patient seen during rounds.  Patient is noted to be sitting in the day area.  She reports that she is having a better day.  She reports the voices are going down.  When asked about SI/HI she reports passive SI but no active plan or intent.  Patient reports fair appetite and sleep.  Physical therapy team came to evaluate the patient due to unstable gait and family's concern of unable to attend to her ADLs.  Patient is taking her medications with no reported side effects   Past Psychiatric History: see h&P Family History:  Family History  Problem Relation Age of Onset   Breast cancer Neg Hx    Social History:  Social History   Substance and Sexual Activity  Alcohol Use Not Currently     Social History   Substance and Sexual Activity  Drug Use Not Currently     Social History   Socioeconomic History   Marital status: Married    Spouse name: Not on file   Number of children: Not on file   Years of education: Not on file   Highest education level: Not on file  Occupational History   Not on file  Tobacco Use   Smoking status: Never   Smokeless tobacco: Never  Substance and Sexual Activity   Alcohol use: Not Currently   Drug use: Not Currently   Sexual activity: Not on file  Other Topics Concern   Not on file  Social History Narrative   Not on file   Social Drivers of Health   Tobacco Use: Low Risk (11/25/2024)   Patient History    Smoking Tobacco Use: Never    Smokeless Tobacco Use: Never    Passive Exposure: Not on file  Financial Resource Strain: Low Risk  (10/18/2024)   Received from [facility]   Overall Financial Resource Strain (CARDIA)    Difficulty of Paying Living Expenses: Not hard at all  Food Insecurity: No Food Insecurity (11/25/2024)   Epic    Worried About Radiation Protection Practitioner of Food in the Last Year: Never true    Ran Out of Food in the Last Year: Never true  Transportation Needs: No Transportation Needs (11/25/2024)   Epic    Lack of Transportation (Medical): No    Lack of Transportation (Non-Medical): No  Physical Activity: Not on file  Stress: Not on file  Social Connections: Socially Isolated (11/25/2024)   Social Connection and Isolation Panel    Frequency of Communication  with Friends and Family: Never    Frequency of Social Gatherings with Friends and Family: Never    Attends Religious Services: Never    Database Administrator or Organizations: No    Attends Banker Meetings: Never    Marital Status: Widowed  Depression (PHQ2-9): Not on file  Alcohol Screen: Low Risk (11/24/2024)   Alcohol Screen    Last Alcohol Screening Score (AUDIT): 0  Housing: Low Risk (11/25/2024)   Epic    Unable to Pay for Housing in the Last Year: No    Number of Times Moved in the Last Year: 0    Homeless  in the Last Year: No  Utilities: Not At Risk (11/25/2024)   Epic    Threatened with loss of utilities: No  Health Literacy: Not on file   Past Medical History:  Past Medical History:  Diagnosis Date   Anemia    Hypertension     Past Surgical History:  Procedure Laterality Date   ABDOMINAL HYSTERECTOMY     COLONOSCOPY N/A 04/26/2019   Procedure: COLONOSCOPY;  Surgeon: Unk Corinn Skiff, MD;  Location: [facility] ENDOSCOPY;  Service: Gastroenterology;  Laterality: N/A;   COLONOSCOPY N/A 04/27/2019   Procedure: COLONOSCOPY;  Surgeon: Unk Corinn Skiff, MD;  Location: [facility] ENDOSCOPY;  Service: Gastroenterology;  Laterality: N/A;   ESOPHAGOGASTRODUODENOSCOPY N/A 04/25/2019   Procedure: ESOPHAGOGASTRODUODENOSCOPY (EGD);  Surgeon: Unk Corinn Skiff, MD;  Location: [facility] ENDOSCOPY;  Service: Gastroenterology;  Laterality: N/A;   ESOPHAGOGASTRODUODENOSCOPY N/A 04/26/2019   Procedure: ESOPHAGOGASTRODUODENOSCOPY (EGD);  Surgeon: Unk Corinn Skiff, MD;  Location: [facility] ENDOSCOPY;  Service: Gastroenterology;  Laterality: N/A;   TEMPORARY DIALYSIS CATHETER N/A 04/21/2019   Procedure: TEMPORARY DIALYSIS CATHETER;  Surgeon: Marea Selinda RAMAN, MD;  Location: [facility] INVASIVE CV LAB;  Service: Cardiovascular;  Laterality: N/A;    Current Medications: Current Facility-Administered Medications  Medication Dose Route Frequency Provider Last Rate Last Admin   acetaminophen  (TYLENOL ) tablet 650 mg  650 mg Oral Q6H PRN McLauchlin, Angela, NP   650 mg at 11/24/24 2338   alum & mag hydroxide-simeth (MAALOX/MYLANTA) 200-200-20 MG/5ML suspension 30 mL  30 mL Oral Q4H PRN McLauchlin, Angela, NP       atorvastatin  (LIPITOR ) tablet 80 mg  80 mg Oral Daily Madaram, Kondal R, MD   80 mg at 11/29/24 0841   DULoxetine  (CYMBALTA ) DR capsule 60 mg  60 mg Oral Daily Tenee Wish, MD   60 mg at 11/29/24 0841   feeding supplement (ENSURE PLUS HIGH PROTEIN) liquid 237 mL  237 mL Oral TID BM Blakeley Scheier, MD   237 mL at 11/29/24 0848    felodipine  (PLENDIL ) 24 hr tablet 10 mg  10 mg Oral Daily Madaram, Kondal R, MD   10 mg at 11/28/24 9175   gabapentin  (NEURONTIN ) capsule 300 mg  300 mg Oral BID Elesa Perkins, RPH   300 mg at 11/29/24 9158   And   gabapentin  (NEURONTIN ) capsule 600 mg  600 mg Oral QHS Elesa Perkins, RPH   600 mg at 11/28/24 2108   irbesartan  (AVAPRO ) tablet 75 mg  75 mg Oral Daily Madaram, Kondal R, MD   75 mg at 11/29/24 9157   magnesium  hydroxide (MILK OF MAGNESIA) suspension 30 mL  30 mL Oral Daily PRN McLauchlin, Angela, NP       mirtazapine  (REMERON ) tablet 15 mg  15 mg Oral QHS Madaram, Kondal R, MD   15 mg at 11/28/24 2108   multivitamin with minerals tablet 1 tablet  1 tablet Oral Daily Zanaya Baize, MD   1 tablet at 11/29/24 9157   OLANZapine  zydis (ZYPREXA ) disintegrating tablet 5 mg  5 mg Oral TID PRN McLauchlin, Angela, NP   5 mg at 11/25/24 2318   pantoprazole  (PROTONIX ) EC tablet 40 mg  40 mg Oral BID AC Madaram, Kondal R, MD   40 mg at 11/29/24 0841   risperiDONE  (RISPERDAL ) tablet 2 mg  2 mg Oral BID Baeleigh Devincent, MD   2 mg at 11/29/24 0841   thiamine  (VITAMIN B1) tablet 100 mg  100 mg Oral Daily Tashawna Thom, MD   100 mg at 11/29/24 9157   traZODone  (DESYREL ) tablet 50 mg  50 mg Oral QHS PRN McLauchlin, Angela, NP   50 mg at 11/28/24 2109    Lab Results: No results found for this or any previous visit (from the past 48 hours).  Blood Alcohol level:  Lab Results  Component Value Date   [facility] <15 11/24/2024    Metabolic Disorder Labs: Lab Results  Component Value Date   HGBA1C 5.3 04/29/2019   MPG 105.41 04/29/2019   No results found for: PROLACTIN Lab Results  Component Value Date   CHOL 175 04/29/2019   TRIG 93 04/29/2019   HDL 68 04/29/2019   CHOLHDL 2.6 04/29/2019   VLDL 19 04/29/2019   LDLCALC 88 04/29/2019    Physical Findings: AIMS:  , ,  ,  ,    CIWA:    COWS:      Psychiatric Specialty Exam:  Presentation  General Appearance: Bizarre  Eye  Contact:Fleeting  Speech:Slow; Slurred  Speech Volume:Decreased    Mood and Affect  Mood:Anxious  Affect:Blunt   Thought Process  Thought Processes:Disorganized  Orientation:Partial  Thought Content:Illogical; Paranoid Ideation; Delusions  Hallucinations:auditory hallucinations,  Ideas of Reference: Denies Suicidal Thoughts: Reports passive SI Homicidal Thoughts:denies   Sensorium  Memory:Immediate Fair; Remote Fair  Judgment:Impaired  Insight:Shallow   Executive Functions  Concentration:Poor  Attention Span:Fair  Recall:Fair  Fund of Knowledge:Fair  Language:Fair   Psychomotor Activity  Psychomotor Activity:No data recorded  Musculoskeletal: Strength & Muscle Tone: decreased Gait & Station: unsteady Assets  Assets:No data recorded   Physical Exam: Physical Exam Vitals and nursing note reviewed.    ROS Blood pressure (!) 95/45, pulse 92, temperature 98.1 F (36.7 C), resp. rate 17, height 4' 10 (1.473 m), weight 38.8 kg, SpO2 100%. Body mass index is 17.87 kg/m.  Diagnosis: Principal Problem:   Psychosis (HCC)   PLAN: Safety and Monitoring:  -- Voluntary admission to inpatient psychiatric unit for safety, stabilization and treatment  -- Daily contact with patient to assess and evaluate symptoms and progress in treatment  -- Patient's case to be discussed in multi-disciplinary team meeting  -- Observation Level : q15 minute checks  -- Vital signs:  q12 hours  -- Precautions: suicide, elopement, and assault -- Encouraged patient to participate in unit milieu and in scheduled group therapies  2. Psychiatric Treatment:  Scheduled Medications:    Risperdal  1 mg twice daily-plan to titrated up to 2 mg twice daily Duloxetine  30 mg daily-increase to 60 mg daily Remeron  15 mg nightly -- The risks/benefits/side-effects/alternatives to this medication were discussed in detail with the patient and time was given for questions. The patient  consents to medication trial.  3. Medical Issues Being Addressed:     4. Discharge Planning:   -- Social work and case management to assist with discharge planning and identification of hospital follow-up needs prior to discharge  --  Estimated LOS: 3-4 days  Heydi Swango, MD 11/29/2024, 12:28 PM

## 2024-11-29 NOTE — [facility] Counselor (Signed)
 Adult Comprehensive Assessment  Patient ID: Sarah Sullivan, female   DOB: 10/13/1946, 79 y.o.   MRN: [id number]  Information Source: Information source: Patient  Current Stressors:  Patient states their primary concerns and needs for treatment are:: UTA Patient states their goals for this hospitilization and ongoing recovery are:: UTA Educational / Learning stressors: UTA Employment / Job issues: UTA Family Relationships: Oncologist / Lack of resources (include bankruptcy): UTA Housing / Lack of housing: UTA Physical health (include injuries & life threatening diseases): UTA Social relationships: UTA Substance abuse: UTA Bereavement / Loss: UTA  Living/Environment/Situation:  Living Arrangements:  (UTA) Living conditions (as described by patient or guardian): UTA Who else lives in the home?: UTA How long has patient lived in current situation?: UTA What is atmosphere in current home:  (UTA)  Family History:  Marital status:  (UTA) Are you sexually active?:  (UTA) What is your sexual orientation?: UTA Has your sexual activity been affected by drugs, alcohol, medication, or emotional stress?: UTA Does patient have children?:  (UTA)  Childhood History:  By whom was/is the patient raised?:  (UTA) Additional childhood history information: UTA Description of patient's relationship with caregiver when they were a child: UTA Patient's description of current relationship with people who raised him/her: UTA How were you disciplined when you got in trouble as a child/adolescent?: UTA Does patient have siblings?:  (UTA) Did patient suffer any verbal/emotional/physical/sexual abuse as a child?:  (UTA) Did patient suffer from severe childhood neglect?:  (UTA) Has patient ever been sexually abused/assaulted/raped as an adolescent or adult?:  (UTA) Was the patient ever a victim of a crime or a disaster?:  (UTA) Witnessed domestic violence?:  (UTA) Has patient been affected by domestic  violence as an adult?:  INDUSTRIAL/PRODUCT DESIGNER)  Education:  Highest grade of school patient has completed: UTA Currently a consulting civil engineer?:  (UTA) Learning disability?:  (UTA)  Employment/Work Situation:   Employment Situation:  INDUSTRIAL/PRODUCT DESIGNER) Patient's Job has Been Impacted by Current Illness:  (UTA) What is the Longest Time Patient has Held a Job?: UTA Where was the Patient Employed at that Time?: UTA Has Patient ever Been in the [employer]?:  (UTA)  Financial Resources:   Financial resources:  INDUSTRIAL/PRODUCT DESIGNER) Does patient have a lawyer or guardian?:  (UTA)  Alcohol/Substance Abuse:   What has been your use of drugs/alcohol within the last 12 months?: UTA If attempted suicide, did drugs/alcohol play a role in this?:  (UTA) If yes, describe treatment and response: UTA Is patient motivated for change?:  (UTA) Does patient live in an environment that promotes recovery or serves as an obstacle to recovery?:  (UTA) Are others in the home using alcohol or other substances?:  (UTA) Are significant others in the home willing to participate in the patient's care?:  (UTA) Has alcohol/substance abuse ever caused legal problems?:  (UTA)  Social Support System:   Patient's Community Support System:  (UTA) Describe Community Support System: UTA Type of faith/religion: UTA How does patient's faith help to cope with current illness?: UTA  Leisure/Recreation:   Do You Have Hobbies?:  (UTA)  Strengths/Needs:   What is the patient's perception of their strengths?: UTA Patient states they can use these personal strengths during their treatment to contribute to their recovery: UTA Patient states these barriers may affect/interfere with their treatment: UTA Patient states these barriers may affect their return to the community: UTA Other important information patient would like considered in planning for their treatment: UTA  Discharge Plan:   Currently  receiving community mental health services:  (UTA) Patient states  concerns and preferences for aftercare planning are: UTA Patient states they will know when they are safe and ready for discharge when: UTA Does patient have access to transportation?:  (UTA) Does patient have financial barriers related to discharge medications?:  (UTA) Patient description of barriers related to discharge medications: UTA Will patient be returning to same living situation after discharge?:  (UTA)  Summary/Recommendations:   Summary and Recommendations (to be completed by the evaluator): Patient is an 79 year-old-female from [place], [place] [facility]). According to H&P, past psychiatric history of depression and mood disturbances, presenting to the ED for worsening auditory hallucinations. She reports hearing voices telling her that people are trying to harm her, and she has been increasingly paranoid. She describes having to whisper while speaking and being hypervigilant at home, checking windows every 15 minutes.    She reports difficulty sleeping and increased confusion, including misperceptions regarding her dogs leaving the house. She denies suicidal ideation and homicidal ideation today but endorses some thoughts of hurting others per her primary care provider, Dr. Perri.    Collateral history from her sister-in-law notes progressive worsening of hallucinations over the past six weeks, severe paranoia, social withdrawal, and safety concerns at home. The patient lives alone with two dogs. Patient is difficult to understand, pt has severe speech issues, unable to complete assessment with pt. Pt did give verbal permission to speak with her sister-in-law. CSW will obtain collateral information from sister-in-law. Pt's primary diagnosis is Psychosis.   Recommendations include: crisis stabilization, therapeutic milieu, encourage group attendance and participation, medication management for mood stabilization and development of comprehensive mental wellness/sobriety plan.  Sarah Sullivan. 11/29/2024

## 2024-11-29 NOTE — Progress Notes (Signed)
 Patient presents: cooperative and pleasant. Patient continues to use walker to assist with ambulation with no issues.   SI/HI/AVH: Denies   Plan: Denies   Groups attended: 3/3   Appetite: Adequate. Attended meals.   Sleep: No sleep disturbances reported.   PRNS: Maalox due to upset stomach which was effective.    Disturbances: No disturbances. Patient remains cooperative in milieu.    Questions/concerns: Patient BP noted to be low today and patient encouraged to drink gatorade/fluids throughout the day. BP medication held and provider notified. Patient denies any s/s of any malaise.

## 2024-11-29 NOTE — Group Note (Signed)
 Date:  11/29/2024 Time:  4:58 PM  Group Topic/Focus:  Coping With Mental Health Crisis:   The purpose of this group is to help patients identify strategies for coping with mental health crisis.  Group discusses possible causes of crisis and ways to manage them effectively. Healthy Communication:   The focus of this group is to discuss communication, barriers to communication, as well as healthy ways to communicate with others. Making Healthy Choices:   The focus of this group is to help patients identify negative/unhealthy choices they were using prior to admission and identify positive/healthier coping strategies to replace them upon discharge.  A group was facilitated focusing on fun, critical-thinking activities. Patients participated in games such as Family Feud and What Would You Do? scenarios. The group encouraged patients to identify positive aspects of situations while engaging in problem-solving and decision-making skills. Patients were given opportunities to share ideas, collaborate, and practice healthy coping strategies in a supportive environment.  Participation Level:  Active  Participation Quality:  Appropriate  Affect:  Appropriate  Cognitive:  Appropriate  Insight: Appropriate  Engagement in Group:  Limited  Modes of Intervention:  Activity and Discussion  Additional Comments:    Haidan Nhan L Shahad Mazurek 11/29/2024, 4:58 PM

## 2024-11-29 NOTE — Plan of Care (Signed)
 Patient alert and oriented. Denies SI, HI, AVH and pain. Scheduled and PRN medications administered per MAR. Support and encouragement provided.  Routine safety checks conducted every 15 minutes.  Patient informed to notify staff with problems or concerns. No adverse drug reactions noted. Patient verbally contracts for safety at this time. Patient interacts well with others on the unit.  Patient remains safe at this time.  Problem: Health Behavior/Discharge Planning: Goal: Ability to make decisions will improve Outcome: Progressing Goal: Compliance with therapeutic regimen will improve Outcome: Progressing   Problem: Safety: Goal: Ability to disclose and discuss suicidal ideas will improve Outcome: Progressing Goal: Ability to identify and utilize support systems that promote safety will improve Outcome: Progressing

## 2024-11-29 NOTE — Group Note (Signed)
 Date:  11/29/2024 Time:  9:55 AM  Group Topic/Focus:  Coping With Mental Health Crisis:   The purpose of this group is to help patients identify strategies for coping with mental health crisis.  Group discusses possible causes of crisis and ways to manage them effectively.  Two motivational and supportive meditation videos were provided to promote relaxation and rejuvenation of the mind, body, and spirit. Patients were guided to reflect on their experiences from 2025 and encouraged to make intentional, healthy decisions for entering 2026 on a positive and balanced path. Time was allotted for guided breathing exercises, allowing patients to focus on deep inhalation and exhalation while engaging in personal reflection.  Participation Level:  Active  Participation Quality:  Appropriate  Affect:  Appropriate  Cognitive:  Appropriate  Insight: Appropriate  Engagement in Group:  Limited  Modes of Intervention:  Activity and Discussion  Additional Comments:    Bevan Disney L Jamina Macbeth 11/29/2024, 9:55 AM

## 2024-11-29 NOTE — Progress Notes (Signed)
" °   11/29/24 2100  Psych Admission Type (Psych Patients Only)  Admission Status Voluntary  Psychosocial Assessment  Patient Complaints None  Eye Contact Fair  Facial Expression Animated  Affect Appropriate to circumstance  Speech Slurred (no dentures)  Interaction Assertive  Motor Activity Slow  Appearance/Hygiene In scrubs  Behavior Characteristics Cooperative;Appropriate to situation  Mood Pleasant  Thought Process  Coherency Disorganized  Content Delusions  Delusions Paranoid  Perception Hallucinations  Hallucination Auditory  Judgment Impaired  Confusion Mild  Danger to Self  Current suicidal ideation? Denies  Agreement Not to Harm Self Yes  Description of Agreement Verbal  Danger to Others  Danger to Others None reported or observed    "

## 2024-11-29 NOTE — Evaluation (Signed)
 Physical Therapy Evaluation Patient Details Name: Sarah Sullivan MRN: [id number] DOB: [birth year] Today's Date: 11/29/2024  History of Present Illness  79 year old female with a past psychiatric history of depression and mood disturbances, presenting to the ED for worsening auditory hallucinations. She reports hearing voices telling her that people are trying to harm her, and she has been increasingly paranoid.  Concerns for some instability.  Clinical Impression  Pt pleasant and eager to talk with PT.  She reports she feels close to her baseline, has ongoing issues with R foot/toe weakness but was able to clear toes, etc during ambulation with and w/o AD (did show increased confidence and steadiness with FWW).  She also indicates some L shoulder discomfort but was able to raise it overhead and displayed decent functional ROM and strength.  Pt will benefit from continued PT to address functional limitations.      If plan is discharge home, recommend the following: Assist for transportation   Can travel by private vehicle        Equipment Recommendations Rolling walker (2 wheels) (youth height)  Recommendations for Other Services       Functional Status Assessment Patient has had a recent decline in their functional status and demonstrates the ability to make significant improvements in function in a reasonable and predictable amount of time.     Precautions / Restrictions Precautions Precautions: Fall Restrictions Weight Bearing Restrictions Per Provider Order: No      Mobility  Bed Mobility Overal bed mobility: Modified Independent                  Transfers Overall transfer level: Modified independent Equipment used: Rolling walker (2 wheels)               General transfer comment: Pt was able to rise with and w/o AD, no overt LOBs with all acts    Ambulation/Gait Ambulation/Gait assistance: Modified independent (Device/Increase time) Gait Distance (Feet):  200 Feet Assistive device: Rolling walker (2 wheels)         General Gait Details: Pt was able to ambulate with and w/o UE support (no UEs, single HHA, walker) and showed relatively good confidence and safety.  No LOBs or fatigue, very minimal guarding with no UE support ambulation but able to clear R toes, etc w/o intervention  Stairs            Wheelchair Mobility     Tilt Bed    Modified Rankin (Stroke Patients Only)       Balance Overall balance assessment: Needs assistance Sitting-balance support: No upper extremity supported Sitting balance-Leahy Scale: Normal     Standing balance support: Bilateral upper extremity supported, Single extremity supported, No upper extremity supported Standing balance-Leahy Scale: Good Standing balance comment: Pt accepted balance challenges relatively well - did struggle with heel-toe ambulation with faded UE support, but static and minimal challenges were met with good confidence and safety                             Pertinent Vitals/Pain Pain Assessment Pain Assessment:  (minimal chronic R LE pain - h/o lumbar surgery and R foot drop)    Home Living Family/patient expects to be discharged to:: Private residence Living Arrangements: Alone Available Help at Discharge: Available PRN/intermittently (reports sister-in-law lives 20-30 minutes away - rarely checking in)   Home Access: Stairs to enter   [employer] of Steps: 1   Home Layout: One  level Home Equipment: Agricultural Consultant (2 wheels)      Prior Function Prior Level of Function : Independent/Modified Independent;Driving             Mobility Comments: Pt reports that she does not normally need AD, reports 2 falls in the last 6 months; wind blowing the door into her and a another hitting her head on a cabinet while bending over to reach into it       Extremity/Trunk Assessment   Upper Extremity Assessment Upper Extremity Assessment:  Generalized weakness (mild L shoulder pain - but functional able to raise >90)    Lower Extremity Assessment Lower Extremity Assessment: Overall WFL for tasks assessed (WFL except for R ankle DF and great toe extension - chronic issues from remote lumbar surgery)       Communication   Communication Communication: No apparent difficulties    Cognition Arousal: Alert Behavior During Therapy: WFL for tasks assessed/performed   PT - Cognitive impairments: No apparent impairments                         Following commands: Intact       Cueing Cueing Techniques: Verbal cues, Gestural cues, Visual cues     General Comments General comments (skin integrity, edema, etc.): Pt displayed good confidence with most acts, reports R toe/ankle weakness is chronic, mild c/o L shoulder issue w/o significant functional limitations.    Exercises     Assessment/Plan    PT Assessment Patient needs continued PT services  PT Problem List Decreased strength;Decreased balance;Decreased safety awareness;Decreased knowledge of use of DME       PT Treatment Interventions DME instruction;Gait training;Functional mobility training;Therapeutic exercise;Therapeutic activities;Balance training;Patient/family education    PT Goals (Current goals can be found in the Care Plan section)  Acute Rehab PT Goals Patient Stated Goal: keep walking PT Goal Formulation: With patient Time For Goal Achievement: 12/12/24 Potential to Achieve Goals: Good    Frequency Min 1X/week     Co-evaluation               AM-PAC PT 6 Clicks Mobility  Outcome Measure Help needed turning from your back to your side while in a flat bed without using bedrails?: None Help needed moving from lying on your back to sitting on the side of a flat bed without using bedrails?: None Help needed moving to and from a bed to a chair (including a wheelchair)?: None Help needed standing up from a chair using your arms  (e.g., wheelchair or bedside chair)?: None Help needed to walk in hospital room?: A Little Help needed climbing 3-5 steps with a railing? : A Little 6 Click Score: 22    End of Session Equipment Utilized During Treatment: Gait belt Activity Tolerance: Patient tolerated treatment well Patient left: in chair (common room for lunch) Nurse Communication: Mobility status PT Visit Diagnosis: Unsteadiness on feet (R26.81);Difficulty in walking, not elsewhere classified (R26.2)    Time: 1115-1140 PT Time Calculation (min) (ACUTE ONLY): 25 min   Charges:   PT Evaluation $PT Eval Low Complexity: 1 Low   PT General Charges $$ ACUTE PT VISIT: 1 Visit         Carmin JONELLE Deed, DPT 11/29/2024, 1:51 PM

## 2024-11-29 NOTE — [facility] Suicide Risk Assessment (Signed)
 [facility] INPATIENT:  Family/Significant Other Suicide Prevention Education  Suicide Prevention Education:  Education Completed; [name] Maynor, sister-in-law, [phone] ,  (name of family member/significant other) has been identified by the patient as the family member/significant other with whom the patient will be residing, and identified as the person(s) who will aid the patient in the event of a mental health crisis (suicidal ideations/suicide attempt).  With written consent from the patient, the family member/significant other has been provided the following suicide prevention education, prior to the and/or following the discharge of the patient.  The suicide prevention education provided includes the following: Suicide risk factors Suicide prevention and interventions National Suicide Hotline telephone number [facility] assessment telephone number [facility] Emergency Assistance 911 [facility] and/or Residential Mobile Crisis Unit telephone number  Request made of family/significant other to: Remove weapons (e.g., guns, rifles, knives), all items previously/currently identified as safety concern.   Remove drugs/medications (over-the-counter, prescriptions, illicit drugs), all items previously/currently identified as a safety concern.  The family member/significant other verbalizes understanding of the suicide prevention education information provided.  The family member/significant other agrees to remove the items of safety concern listed above.  Sarah Sullivan 11/29/2024, 12:55 PM

## 2024-11-29 NOTE — Evaluation (Signed)
 Occupational Therapy Evaluation Patient Details Name: Sarah Sullivan MRN: [id number] DOB: 1946/07/03 Today's Date: 11/29/2024   History of Present Illness   79 year old female with a past psychiatric history of depression and mood disturbances, presenting to the ED for worsening auditory hallucinations. She reports hearing voices telling her that people are trying to harm her, and she has been increasingly paranoid.  Concerns for some instability.     Clinical Impressions Chart reviewed to date, pt greeted in day room, oriented to self and place. She is HOH. Pt follows one step directives with vcs, decreased performance with multi step directives due to working memory impairments. PTA pt reports she performs ADL/IADL with MOD I-I, has Prn assist for IADL. Pt completed SLUMS examination this date scoring */30 indicating a positive screen for dementia. Of note, it is not within occupational therapy scope of practice to diagnose cognitive impairments, this screen indicates need for further testing. The SLUMS is a 30 point, 11 question screening questionnaire that tests orientation, memory, attention, and executive function. Pt with noted impairments in short term memory, problem solving, and executive function limiting ability to safely and effectively perform ADL/IADL. Pt performs familiar ADLs such as dressing, toileting, grooming at a supervision level. She amb >200' with RW with supervision. Recommend supervision for higher level ADLs and IADLs (including med management at this time. No further OT needs identified. OT will sign off. Please re consult if there is a change in functional status.   [facility] Mental Status Examination Orientation: 1/3 ([place] ) Calculations: 0/3 Naming animals: 0/3 Patient named 4 animals (0 points is 0-4 animals; 1 is 5-9 animals; 2 is 10-14 animals; 3 is 15+ animals) Recall: 0/5  Attention: 0/2 Clock drawing: 0/4 Visual Processing: 1/2 Paragraph  Memory: 0/8  Total: 2/30;  Given that patient has a HS level of education, this score falls in the dementia range.       If plan is discharge home, recommend the following:   Assistance with cooking/housework;Assist for transportation;Direct supervision/assist for financial management;Supervision due to cognitive status;Direct supervision/assist for medications management     Functional Status Assessment   Patient has had a recent decline in their functional status and demonstrates the ability to make significant improvements in function in a reasonable and predictable amount of time.     Equipment Recommendations   Tub/shower seat     Recommendations for Other Services         Precautions/Restrictions   Precautions Precautions: Fall Recall of Precautions/Restrictions: Impaired     Mobility Bed Mobility Overal bed mobility: Modified Independent                  Transfers Overall transfer level: Modified independent                        Balance Overall balance assessment: Needs assistance Sitting-balance support: Feet supported Sitting balance-Leahy Scale: Normal     Standing balance support: Bilateral upper extremity supported, During functional activity Standing balance-Leahy Scale: Good                             ADL either performed or assessed with clinical judgement   ADL Overall ADL's : Needs assistance/impaired                     Lower Body Dressing: Supervision/safety;Sitting/lateral leans Lower Body Dressing Details (indicate cue type and reason): pants Toilet  Transfer: Supervision/safety;Ambulation;Rolling walker (2 wheels);Regular Toilet   Toileting- Clothing Manipulation and Hygiene: Supervision/safety;Sitting/lateral lean Toileting - Clothing Manipulation Details (indicate cue type and reason): continent urine on toilet     Functional mobility during ADLs: Supervision/safety;Rolling walker (2  wheels) (>200' with RW)       Vision Patient Visual Report: No change from baseline       Perception         Praxis         Pertinent Vitals/Pain Pain Assessment Pain Assessment: No/denies pain     Extremity/Trunk Assessment Upper Extremity Assessment Upper Extremity Assessment: Generalized weakness   Lower Extremity Assessment Lower Extremity Assessment: Generalized weakness       Communication Communication Communication: Impaired Factors Affecting Communication: Hearing impaired   Cognition Arousal: Alert Behavior During Therapy: WFL for tasks assessed/performed Cognition: Cognition impaired   Orientation impairments: Time, Situation   Memory impairment (select all impairments): Working civil service fast streamer, Programmer, systems, Engineer, structural memory Attention impairment (select first level of impairment): Sustained attention Executive functioning impairment (select all impairments): Reasoning, Problem solving OT - Cognition Comments: pt participate in SLUMS assessment, scores 2/30                 Following commands: Impaired Following commands impaired:  (one step commands only)     Cueing  General Comments   Cueing Techniques: Verbal cues;Gestural cues;Visual cues  Pt displayed good confidence with most acts, reports R toe/ankle weakness is chronic, mild c/o L shoulder issue w/o significant functional limitations.   Exercises Other Exercises Other Exercises: edu re role of OT, role of rehab   Shoulder Instructions      Home Living Family/patient expects to be discharged to:: Private residence Living Arrangements: Alone Available Help at Discharge: Available PRN/intermittently Type of Home: House Home Access: Stairs to enter [employer] of Steps: 1   Home Layout: One level               Home Equipment: Agricultural Consultant (2 wheels)          Prior Functioning/Environment Prior Level of Function : Independent/Modified  Independent;Driving             Mobility Comments: amb with no AD ADLs Comments: pt reports MOD I-I in ADL, has PRN assist from SIL for IADL    OT Problem List: Decreased cognition   OT Treatment/Interventions:        OT Goals(Current goals can be found in the care plan section)   Acute Rehab OT Goals Patient Stated Goal: walk OT Goal Formulation: With patient Time For Goal Achievement: 12/13/24 Potential to Achieve Goals: Good   OT Frequency:       Co-evaluation              AM-PAC OT 6 Clicks Daily Activity     Outcome Measure Help from another person eating meals?: None Help from another person taking care of personal grooming?: None Help from another person toileting, which includes using toliet, bedpan, or urinal?: None Help from another person bathing (including washing, rinsing, drying)?: A Little Help from another person to put on and taking off regular upper body clothing?: None Help from another person to put on and taking off regular lower body clothing?: None 6 Click Score: 23   End of Session Equipment Utilized During Treatment: Rolling walker (2 wheels) Nurse Communication: Mobility status  Activity Tolerance: Patient tolerated treatment well Patient left: Other (comment) (in day room for group with staff)  Time: 8587-8560 OT Time Calculation (min): 27 min Charges:  OT General Charges $OT Visit: 1 Visit OT Evaluation $OT Eval Low Complexity: 1 Low  Therisa Sheffield, OTD OTR/L  11/29/2024, 2:58 PM

## 2024-11-30 DIAGNOSIS — F29 Unspecified psychosis not due to a substance or known physiological condition: Secondary | ICD-10-CM | POA: Diagnosis not present

## 2024-11-30 NOTE — Group Note (Signed)
 Recreation Therapy Group Note   Group Topic:Leisure Education  Group Date: 11/30/2024 Start Time: 1415 End Time: 1500 Facilitators: Celestia Jeoffrey BRAVO, LRT, CTRS Location: Courtyard  Group Description: Outdoor Recreation. Patients had the option to play corn hole, ring toss, UNO, or listening to music while outside in the courtyard getting fresh air and sunlight.  LRT and patients discussed things that they enjoy doing in their free time outside of the hospital. LRT encouraged patients to drink water  after being active and getting their heart rate up.   Goal Area(s) Addressed: Patient will identify leisure interests.  Patient will practice healthy decision making. Patient will engage in recreation activity.   Affect/Mood: N/A   Participation Level: Did not attend    Clinical Observations/Individualized Feedback: Patient did not attend.  Plan: Continue to engage patient in RT group sessions 2-3x/week.   Jeoffrey BRAVO Celestia, LRT, CTRS 11/30/2024 4:16 PM

## 2024-11-30 NOTE — Group Note (Signed)
 LCSW Group Therapy Note  Group Date: 11/30/2024 Start Time: 1325 End Time: 1340   Type of Therapy and Topic:  Group Therapy - How To Cope with Nervousness about Discharge   Participation Level:  Did Not Attend   Description of Group This process group involved identification of patients' feelings about discharge. Some of them are scheduled to be discharged soon, while others are new admissions, but each of them was asked to share thoughts and feelings surrounding discharge from the hospital. One common theme was that they are excited at the prospect of going home, while another was that many of them are apprehensive about sharing why they were hospitalized. Patients were given the opportunity to discuss these feelings with their peers in preparation for discharge.  Therapeutic Goals  Patient will identify their overall feelings about pending discharge. Patient will think about how they might proactively address issues that they believe will once again arise once they get home (i.e. with parents). Patients will participate in discussion about having hope for change.   Summary of Patient Progress:  X   Therapeutic Modalities Cognitive Behavioral Therapy   Lum JONETTA Croft, LCSWA 11/30/2024  2:18 PM

## 2024-11-30 NOTE — Group Note (Signed)
 Date:  11/30/2024 Time:  10:53 AM  Group Topic/Focus:  Rediscovering Joy:   The focus of this group is to explore various ways to relieve stress in a positive manner.    Participation Level:  Active  Participation Quality:  Appropriate  Affect:  Appropriate  Cognitive:  Appropriate  Insight: Appropriate  Engagement in Group:  Engaged  Modes of Intervention:  Activity  Additional Comments:    Sarah Sullivan 11/30/2024, 10:53 AM

## 2024-11-30 NOTE — Group Note (Signed)
 Date:  11/30/2024 Time:  8:47 PM  Group Topic/Focus:  Wrap-Up Group:   The focus of this group is to help patients review their daily goal of treatment and discuss progress on daily workbooks.    Participation Level:  Active  Participation Quality:  Appropriate  Affect:  Appropriate  Cognitive:  Appropriate  Insight: Appropriate  Engagement in Group:  Engaged  Modes of Intervention:  Discussion  Additional Comments:    Beatris ONEIDA Hasten 11/30/2024, 8:47 PM

## 2024-11-30 NOTE — Plan of Care (Signed)
" °  Problem: Activity: Goal: Interest or engagement in leisure activities will improve Outcome: Progressing   Problem: Self-Concept: Goal: Level of anxiety will decrease Outcome: Progressing   Problem: Safety: Goal: Periods of time without injury will increase Outcome: Progressing   "

## 2024-11-30 NOTE — Group Note (Signed)
 Date:  11/30/2024 Time:  3:45 PM  Group Topic/Focus:  Movement Therapy, Getting fit with [name].    Participation Level:  Did Not Attend    Sarah Sullivan Bias 11/30/2024, 3:45 PM

## 2024-11-30 NOTE — Plan of Care (Signed)
" °  Problem: Self-Concept: Goal: Level of anxiety will decrease Outcome: Progressing   Problem: Activity: Goal: Interest or engagement in leisure activities will improve Outcome: Progressing   Problem: Education: Goal: Emotional status will improve Outcome: Progressing   "

## 2024-11-30 NOTE — Progress Notes (Signed)
 Behavior:   Pleasant and cooperative.     Psych assessment:   Endorses anxiety and AH (getting louder).  Denies SI/HI and VH.    Interaction / Group attendance:  Present in the milieu.  Appropriate interaction with peers and staff.   Attended 1/4 groups.   Medication/ PRNs: Compliant.  Pain: Denies.  15 min checks in place for safety.

## 2024-11-30 NOTE — Plan of Care (Signed)
   Problem: Activity: Goal: Interest or engagement in activities will improve Outcome: Progressing   Problem: Coping: Goal: Ability to demonstrate self-control will improve Outcome: Progressing

## 2024-11-30 NOTE — Progress Notes (Signed)
" °   11/30/24 2020  Psych Admission Type (Psych Patients Only)  Admission Status Voluntary  Psychosocial Assessment  Patient Complaints None  Eye Contact Fair  Facial Expression Animated  Affect Appropriate to circumstance  Speech Soft  Interaction Assertive  Motor Activity Slow  Appearance/Hygiene In scrubs  Behavior Characteristics Cooperative;Appropriate to situation;Calm  Mood Pleasant  Aggressive Behavior  Effect No apparent injury  Thought Process  Coherency Circumstantial  Content WDL  Delusions None reported or observed  Perception Hallucinations  Hallucination Auditory  Judgment Impaired  Confusion Mild  Danger to Self  Current suicidal ideation? Denies  Agreement Not to Harm Self Yes  Description of Agreement Verbal  Danger to Others  Danger to Others None reported or observed    "

## 2024-11-30 NOTE — Progress Notes (Signed)
 [facility] MD Progress Note  11/30/2024 12:24 PM Sarah Sullivan  MRN:  [id number] Ms. Sarah Sullivan is a 79 year old female with a past psychiatric history of depression and mood disturbances, presenting to the ED for worsening auditory hallucinations. She reports hearing voices telling her that people are trying to harm her, and she has been increasingly paranoid. She describes having to whisper while speaking and being hypervigilant at home, checking windows every 15 minutes.Patient is admitted to [facility] unit with Q15 min safety monitoring. Multidisciplinary team approach is offered. Medication management; group/milieu therapy is offered.  11/29/23: Collaterals:Provider and social worker reached out to patient's family-they informed the team about abrupt onset of psychosis in the last few weeks and change in personality lately.  They informed that patient was independent with no mental health history till now.  Provider and the family discussed the possibility of underlying dementia and also discussed PT OT referrals to evaluate the appropriate level of living conditions. Subjective:  Chart reviewed, case discussed in multidisciplinary meeting, patient seen during rounds.   Patient is noted to be sitting in the day area.  He reports having difficulty getting out of the chair.  She is noted to be very weak.  She reports ongoing auditory hallucinations but is unable to give any details.  She wanted to whispering the providers here.  She reports the voices being loud.  She denies active SI/HI .  She is eating her meals. Past Psychiatric History: see h&P Family History:  Family History  Problem Relation Age of Onset   Breast cancer Neg Hx    Social History:  Social History   Substance and Sexual Activity  Alcohol Use Not Currently     Social History   Substance and Sexual Activity  Drug Use Not Currently    Social History   Socioeconomic History   Marital status: Married    Spouse name: Not on file    Number of children: Not on file   Years of education: Not on file   Highest education level: Not on file  Occupational History   Not on file  Tobacco Use   Smoking status: Never   Smokeless tobacco: Never  Substance and Sexual Activity   Alcohol use: Not Currently   Drug use: Not Currently   Sexual activity: Not on file  Other Topics Concern   Not on file  Social History Narrative   Not on file   Social Drivers of Health   Tobacco Use: Low Risk (11/25/2024)   Patient History    Smoking Tobacco Use: Never    Smokeless Tobacco Use: Never    Passive Exposure: Not on file  Financial Resource Strain: Low Risk  (10/18/2024)   Received from [facility]   Overall Financial Resource Strain (CARDIA)    Difficulty of Paying Living Expenses: Not hard at all  Food Insecurity: No Food Insecurity (11/25/2024)   Epic    Worried About Radiation Protection Practitioner of Food in the Last Year: Never true    Ran Out of Food in the Last Year: Never true  Transportation Needs: No Transportation Needs (11/25/2024)   Epic    Lack of Transportation (Medical): No    Lack of Transportation (Non-Medical): No  Physical Activity: Not on file  Stress: Not on file  Social Connections: Socially Isolated (11/25/2024)   Social Connection and Isolation Panel    Frequency of Communication with Friends and Family: Never    Frequency of Social Gatherings with Friends and Family: Never  Attends Religious Services: Never    Active Member of Clubs or Organizations: No    Attends Banker Meetings: Never    Marital Status: Widowed  Depression (PHQ2-9): Not on file  Alcohol Screen: Low Risk (11/24/2024)   Alcohol Screen    Last Alcohol Screening Score (AUDIT): 0  Housing: Low Risk (11/25/2024)   Epic    Unable to Pay for Housing in the Last Year: No    Number of Times Moved in the Last Year: 0    Homeless in the Last Year: No  Utilities: Not At Risk (11/25/2024)   Epic    Threatened with loss of  utilities: No  Health Literacy: Not on file   Past Medical History:  Past Medical History:  Diagnosis Date   Anemia    Hypertension     Past Surgical History:  Procedure Laterality Date   ABDOMINAL HYSTERECTOMY     COLONOSCOPY N/A 04/26/2019   Procedure: COLONOSCOPY;  Surgeon: Unk Corinn Skiff, MD;  Location: [facility] ENDOSCOPY;  Service: Gastroenterology;  Laterality: N/A;   COLONOSCOPY N/A 04/27/2019   Procedure: COLONOSCOPY;  Surgeon: Unk Corinn Skiff, MD;  Location: [facility] ENDOSCOPY;  Service: Gastroenterology;  Laterality: N/A;   ESOPHAGOGASTRODUODENOSCOPY N/A 04/25/2019   Procedure: ESOPHAGOGASTRODUODENOSCOPY (EGD);  Surgeon: Unk Corinn Skiff, MD;  Location: [facility] ENDOSCOPY;  Service: Gastroenterology;  Laterality: N/A;   ESOPHAGOGASTRODUODENOSCOPY N/A 04/26/2019   Procedure: ESOPHAGOGASTRODUODENOSCOPY (EGD);  Surgeon: Unk Corinn Skiff, MD;  Location: [facility] ENDOSCOPY;  Service: Gastroenterology;  Laterality: N/A;   TEMPORARY DIALYSIS CATHETER N/A 04/21/2019   Procedure: TEMPORARY DIALYSIS CATHETER;  Surgeon: Marea Selinda RAMAN, MD;  Location: [facility] INVASIVE CV LAB;  Service: Cardiovascular;  Laterality: N/A;    Current Medications: Current Facility-Administered Medications  Medication Dose Route Frequency Provider Last Rate Last Admin   acetaminophen  (TYLENOL ) tablet 650 mg  650 mg Oral Q6H PRN McLauchlin, Angela, NP   650 mg at 11/24/24 2338   alum & mag hydroxide-simeth (MAALOX/MYLANTA) 200-200-20 MG/5ML suspension 30 mL  30 mL Oral Q4H PRN McLauchlin, Angela, NP   30 mL at 11/29/24 1443   atorvastatin  (LIPITOR ) tablet 80 mg  80 mg Oral Daily Madaram, Kondal R, MD   80 mg at 11/30/24 0900   DULoxetine  (CYMBALTA ) DR capsule 60 mg  60 mg Oral Daily Arius Harnois, MD   60 mg at 11/30/24 0900   feeding supplement (ENSURE PLUS HIGH PROTEIN) liquid 237 mL  237 mL Oral TID BM Ramesh Moan, MD   237 mL at 11/30/24 0909   felodipine  (PLENDIL ) 24 hr tablet 10 mg  10 mg Oral Daily Madaram,  Kondal R, MD   10 mg at 11/30/24 9147   gabapentin  (NEURONTIN ) capsule 300 mg  300 mg Oral BID Elesa Perkins, RPH   300 mg at 11/30/24 0900   And   gabapentin  (NEURONTIN ) capsule 600 mg  600 mg Oral QHS Elesa Perkins, RPH   600 mg at 11/29/24 2058   irbesartan  (AVAPRO ) tablet 75 mg  75 mg Oral Daily Madaram, Kondal R, MD   75 mg at 11/30/24 0900   magnesium  hydroxide (MILK OF MAGNESIA) suspension 30 mL  30 mL Oral Daily PRN McLauchlin, Angela, NP       mirtazapine  (REMERON ) tablet 15 mg  15 mg Oral QHS Madaram, Kondal R, MD   15 mg at 11/29/24 2058   multivitamin with minerals tablet 1 tablet  1 tablet Oral Daily Baylea Milburn, MD   1 tablet at 11/30/24 0900   OLANZapine   zydis (ZYPREXA ) disintegrating tablet 5 mg  5 mg Oral TID PRN McLauchlin, Jon, NP   5 mg at 11/25/24 2318   pantoprazole  (PROTONIX ) EC tablet 40 mg  40 mg Oral BID AC Madaram, Kondal R, MD   40 mg at 11/30/24 0900   risperiDONE  (RISPERDAL ) tablet 2 mg  2 mg Oral BID Fatimata Talsma, MD   2 mg at 11/30/24 9147   thiamine  (VITAMIN B1) tablet 100 mg  100 mg Oral Daily Maeleigh Buschman, MD   100 mg at 11/30/24 9140   traZODone  (DESYREL ) tablet 50 mg  50 mg Oral QHS PRN McLauchlin, Angela, NP   50 mg at 11/29/24 2058    Lab Results: No results found for this or any previous visit (from the past 48 hours).  Blood Alcohol level:  Lab Results  Component Value Date   [facility] <15 11/24/2024    Metabolic Disorder Labs: Lab Results  Component Value Date   HGBA1C 5.3 04/29/2019   MPG 105.41 04/29/2019   No results found for: PROLACTIN Lab Results  Component Value Date   CHOL 175 04/29/2019   TRIG 93 04/29/2019   HDL 68 04/29/2019   CHOLHDL 2.6 04/29/2019   VLDL 19 04/29/2019   LDLCALC 88 04/29/2019    Physical Findings: AIMS:  , ,  ,  ,    CIWA:    COWS:      Psychiatric Specialty Exam:  Presentation  General Appearance: Bizarre  Eye Contact:Fleeting  Speech:Slow; Slurred  Speech  Volume:Decreased    Mood and Affect  Mood:Anxious  Affect:Blunt   Thought Process  Thought Processes:Disorganized  Orientation:Partial  Thought Content:Illogical; Paranoid Ideation; Delusions  Hallucinations:auditory hallucinations,  Ideas of Reference: Denies Suicidal Thoughts: Reports passive SI Homicidal Thoughts:denies   Sensorium  Memory:Immediate Fair; Remote Fair  Judgment:Impaired  Insight:Shallow   Executive Functions  Concentration:Poor  Attention Span:Fair  Recall:Fair  Fund of Knowledge:Fair  Language:Fair   Psychomotor Activity  Psychomotor Activity:No data recorded  Musculoskeletal: Strength & Muscle Tone: decreased Gait & Station: unsteady Assets  Assets:No data recorded   Physical Exam: Physical Exam Vitals and nursing note reviewed.    ROS Blood pressure (!) 198/82, pulse (!) 102, temperature 97.9 F (36.6 C), resp. rate 16, height 4' 10 (1.473 m), weight 38.8 kg, SpO2 100%. Body mass index is 17.87 kg/m.  Diagnosis: Principal Problem:   Psychosis (HCC)   PLAN: Safety and Monitoring:  -- Voluntary admission to inpatient psychiatric unit for safety, stabilization and treatment  -- Daily contact with patient to assess and evaluate symptoms and progress in treatment  -- Patient's case to be discussed in multi-disciplinary team meeting  -- Observation Level : q15 minute checks  -- Vital signs:  q12 hours  -- Precautions: suicide, elopement, and assault -- Encouraged patient to participate in unit milieu and in scheduled group therapies  2. Psychiatric Treatment:  Scheduled Medications:    Risperdal  1 mg twice daily-plan to titrated up to 2 mg twice daily Duloxetine  30 mg daily-increase to 60 mg daily Remeron  15 mg nightly -- The risks/benefits/side-effects/alternatives to this medication were discussed in detail with the patient and time was given for questions. The patient consents to medication trial.  3. Medical  Issues Being Addressed:     4. Discharge Planning:   -- Social work and case management to assist with discharge planning and identification of hospital follow-up needs prior to discharge  -- Estimated LOS: 3-4 days  Angelina Neece, MD 11/30/2024, 12:24 PM

## 2024-12-01 DIAGNOSIS — F29 Unspecified psychosis not due to a substance or known physiological condition: Secondary | ICD-10-CM | POA: Diagnosis not present

## 2024-12-01 MED ORDER — RISPERIDONE 1 MG PO TABS
3.0000 mg | ORAL_TABLET | Freq: Two times a day (BID) | ORAL | Status: DC
  Administered 2024-12-01 – 2024-12-08 (×14): 3 mg via ORAL
  Filled 2024-12-01 (×14): qty 3

## 2024-12-01 NOTE — Group Note (Signed)
 Date:  12/01/2024 Time:  8:34 PM  Group Topic/Focus:  Identifying Needs:   The focus of this group is to help patients identify their personal needs that have been historically problematic and identify healthy behaviors to address their needs.    Participation Level:  Active  Participation Quality:  Appropriate  Affect:  Appropriate  Cognitive:  Appropriate  Insight: Appropriate and Good  Engagement in Group:  Engaged  Modes of Intervention:  Discussion  Additional Comments:    Sarah Sullivan Hope 12/01/2024, 8:34 PM

## 2024-12-01 NOTE — Plan of Care (Signed)
  Problem: Education: Goal: Knowledge of the prescribed therapeutic regimen will improve Outcome: Progressing   Problem: Activity: Goal: Interest or engagement in leisure activities will improve Outcome: Progressing

## 2024-12-01 NOTE — Group Note (Signed)
 Physical/Occupational Therapy Group Note  Group Topic: UE Therex   Group Date: 12/01/2024 Start Time: 1305 End Time: 1335 Facilitators: Clive Warren CROME, OT   Group Description: Group instructed in series of upper extremities exercises, aimed to promote strength, flexibility, range of motion and functional endurance.  Patients provided cuing for proper mechanics and proper pace of exercise; exercises adjusted as necessary for individualized patient needs.  Patient also engaged in cognitive components throughout session, working to integrate attention to task, command following, turn-taking and appropriate social interaction throughout session.  Allowed to ask questions as appropriate, and encouraged to identify specific exercises that they could complete independently outside of group sessions.  Therapeutic Goal(s):  Demonstrate appropriate performance of upper extremity exercises to promote strength, flexibility, range of motion and functional endurance Identify 2-3 specific upper extremity exercises to complete as home exercise program outside of group session  Individual Participation: Pt did not attend.  Participation Level: Did not attend   Participation Quality:   Behavior:   Speech/Thought Process:   Affect/Mood:   Insight:   Judgement:   Modes of Intervention:   Patient Response to Interventions:    Plan: Continue to engage patient in PT/OT groups 1 - 2x/week.   Jaiden Wahab R., MPH, MS, OTR/L ascom [phone] 12/01/2024, 2:54 PM

## 2024-12-01 NOTE — Progress Notes (Signed)
 [facility] MD Progress Note  12/01/2024 11:57 AM Sarah Sullivan  MRN:  [id number] Ms. Popiel is a 79 year old female with a past psychiatric history of depression and mood disturbances, presenting to the ED for worsening auditory hallucinations. She reports hearing voices telling her that people are trying to harm her, and she has been increasingly paranoid. She describes having to whisper while speaking and being hypervigilant at home, checking windows every 15 minutes.Patient is admitted to [facility] unit with Q15 min safety monitoring. Multidisciplinary team approach is offered. Medication management; group/milieu therapy is offered.  11/29/23: Collaterals:Provider and social worker reached out to patient's family-they informed the team about abrupt onset of psychosis in the last few weeks and change in personality lately.  They informed that patient was independent with no mental health history till now.  Provider and the family discussed the possibility of underlying dementia and also discussed PT OT referrals to evaluate the appropriate level of living conditions.  Subjective:  Chart reviewed, case discussed in multidisciplinary meeting, patient seen during rounds.  Patient is noted to be resting in bed.  She had dentures on.  She reports having difficulty chewing food.  She reports that she threw up all the food but is unable to tell when the episode happened.  When asked about hallucinations she reports worsening hallucinations telling her bad things.  She becomes tearful saying the voices keep telling her go with him otherwise chopped up .  She denies SI/HI.  Reports being very distressed by the hallucinations.  Past Psychiatric History: see h&P Family History:  Family History  Problem Relation Age of Onset   Breast cancer Neg Hx    Social History:  Social History   Substance and Sexual Activity  Alcohol Use Not Currently     Social History   Substance and Sexual Activity  Drug Use Not  Currently    Social History   Socioeconomic History   Marital status: Married    Spouse name: Not on file   Number of children: Not on file   Years of education: Not on file   Highest education level: Not on file  Occupational History   Not on file  Tobacco Use   Smoking status: Never   Smokeless tobacco: Never  Substance and Sexual Activity   Alcohol use: Not Currently   Drug use: Not Currently   Sexual activity: Not on file  Other Topics Concern   Not on file  Social History Narrative   Not on file   Social Drivers of Health   Tobacco Use: Low Risk (11/25/2024)   Patient History    Smoking Tobacco Use: Never    Smokeless Tobacco Use: Never    Passive Exposure: Not on file  Financial Resource Strain: Low Risk  (10/18/2024)   Received from [facility]   Overall Financial Resource Strain (CARDIA)    Difficulty of Paying Living Expenses: Not hard at all  Food Insecurity: No Food Insecurity (11/25/2024)   Epic    Worried About Radiation Protection Practitioner of Food in the Last Year: Never true    Ran Out of Food in the Last Year: Never true  Transportation Needs: No Transportation Needs (11/25/2024)   Epic    Lack of Transportation (Medical): No    Lack of Transportation (Non-Medical): No  Physical Activity: Not on file  Stress: Not on file  Social Connections: Socially Isolated (11/25/2024)   Social Connection and Isolation Panel    Frequency of Communication with Friends and  Family: Never    Frequency of Social Gatherings with Friends and Family: Never    Attends Religious Services: Never    Database Administrator or Organizations: No    Attends Banker Meetings: Never    Marital Status: Widowed  Depression (PHQ2-9): Not on file  Alcohol Screen: Low Risk (11/24/2024)   Alcohol Screen    Last Alcohol Screening Score (AUDIT): 0  Housing: Low Risk (11/25/2024)   Epic    Unable to Pay for Housing in the Last Year: No    Number of Times Moved in the Last Year: 0     Homeless in the Last Year: No  Utilities: Not At Risk (11/25/2024)   Epic    Threatened with loss of utilities: No  Health Literacy: Not on file   Past Medical History:  Past Medical History:  Diagnosis Date   Anemia    Hypertension     Past Surgical History:  Procedure Laterality Date   ABDOMINAL HYSTERECTOMY     COLONOSCOPY N/A 04/26/2019   Procedure: COLONOSCOPY;  Surgeon: Unk Corinn Skiff, MD;  Location: [facility] ENDOSCOPY;  Service: Gastroenterology;  Laterality: N/A;   COLONOSCOPY N/A 04/27/2019   Procedure: COLONOSCOPY;  Surgeon: Unk Corinn Skiff, MD;  Location: [facility] ENDOSCOPY;  Service: Gastroenterology;  Laterality: N/A;   ESOPHAGOGASTRODUODENOSCOPY N/A 04/25/2019   Procedure: ESOPHAGOGASTRODUODENOSCOPY (EGD);  Surgeon: Unk Corinn Skiff, MD;  Location: [facility] ENDOSCOPY;  Service: Gastroenterology;  Laterality: N/A;   ESOPHAGOGASTRODUODENOSCOPY N/A 04/26/2019   Procedure: ESOPHAGOGASTRODUODENOSCOPY (EGD);  Surgeon: Unk Corinn Skiff, MD;  Location: [facility] ENDOSCOPY;  Service: Gastroenterology;  Laterality: N/A;   TEMPORARY DIALYSIS CATHETER N/A 04/21/2019   Procedure: TEMPORARY DIALYSIS CATHETER;  Surgeon: Marea Selinda RAMAN, MD;  Location: [facility] INVASIVE CV LAB;  Service: Cardiovascular;  Laterality: N/A;    Current Medications: Current Facility-Administered Medications  Medication Dose Route Frequency Provider Last Rate Last Admin   acetaminophen  (TYLENOL ) tablet 650 mg  650 mg Oral Q6H PRN McLauchlin, Angela, NP   650 mg at 11/24/24 2338   alum & mag hydroxide-simeth (MAALOX/MYLANTA) 200-200-20 MG/5ML suspension 30 mL  30 mL Oral Q4H PRN McLauchlin, Angela, NP   30 mL at 11/29/24 1443   atorvastatin  (LIPITOR ) tablet 80 mg  80 mg Oral Daily Madaram, Kondal R, MD   80 mg at 12/01/24 9161   DULoxetine  (CYMBALTA ) DR capsule 60 mg  60 mg Oral Daily Carling Liberman, MD   60 mg at 12/01/24 9161   feeding supplement (ENSURE PLUS HIGH PROTEIN) liquid 237 mL  237 mL Oral TID BM Zimir Kittleson,  Ural Acree, MD   237 mL at 12/01/24 0839   felodipine  (PLENDIL ) 24 hr tablet 10 mg  10 mg Oral Daily Madaram, Kondal R, MD   10 mg at 12/01/24 9162   gabapentin  (NEURONTIN ) capsule 300 mg  300 mg Oral BID Elesa Perkins, RPH   300 mg at 12/01/24 9161   And   gabapentin  (NEURONTIN ) capsule 600 mg  600 mg Oral QHS Elesa Perkins, RPH   600 mg at 11/30/24 2103   irbesartan  (AVAPRO ) tablet 75 mg  75 mg Oral Daily Madaram, Kondal R, MD   75 mg at 12/01/24 9162   magnesium  hydroxide (MILK OF MAGNESIA) suspension 30 mL  30 mL Oral Daily PRN McLauchlin, Angela, NP       mirtazapine  (REMERON ) tablet 15 mg  15 mg Oral QHS Madaram, Kondal R, MD   15 mg at 11/30/24 2103   multivitamin with minerals tablet 1 tablet  1 tablet Oral Daily Suprina Mandeville, MD   1 tablet at 12/01/24 0837   OLANZapine  zydis (ZYPREXA ) disintegrating tablet 5 mg  5 mg Oral TID PRN McLauchlin, Angela, NP   5 mg at 11/25/24 2318   pantoprazole  (PROTONIX ) EC tablet 40 mg  40 mg Oral BID AC Madaram, Kondal R, MD   40 mg at 12/01/24 9161   risperiDONE  (RISPERDAL ) tablet 2 mg  2 mg Oral BID Whyatt Klinger, MD   2 mg at 12/01/24 9162   thiamine  (VITAMIN B1) tablet 100 mg  100 mg Oral Daily Manasvi Dickard, MD   100 mg at 12/01/24 9161   traZODone  (DESYREL ) tablet 50 mg  50 mg Oral QHS PRN McLauchlin, Angela, NP   50 mg at 11/30/24 2103    Lab Results: No results found for this or any previous visit (from the past 48 hours).  Blood Alcohol level:  Lab Results  Component Value Date   [facility] <15 11/24/2024    Metabolic Disorder Labs: Lab Results  Component Value Date   HGBA1C 5.3 04/29/2019   MPG 105.41 04/29/2019   No results found for: PROLACTIN Lab Results  Component Value Date   CHOL 175 04/29/2019   TRIG 93 04/29/2019   HDL 68 04/29/2019   CHOLHDL 2.6 04/29/2019   VLDL 19 04/29/2019   LDLCALC 88 04/29/2019    Physical Findings: AIMS:  , ,  ,  ,    CIWA:    COWS:      Psychiatric Specialty Exam:  Presentation   General Appearance: Bizarre  Eye Contact:Fleeting  Speech:Slow; Slurred  Speech Volume:Decreased    Mood and Affect  Mood:Anxious  Affect:Blunt   Thought Process  Thought Processes:Disorganized  Orientation:Partial  Thought Content:Illogical; Paranoid Ideation; Delusions  Hallucinations:auditory hallucinations,  Ideas of Reference: Denies Suicidal Thoughts: Reports passive SI Homicidal Thoughts:denies   Sensorium  Memory:Immediate Fair; Remote Fair  Judgment:Impaired  Insight:Shallow   Executive Functions  Concentration:Poor  Attention Span:Fair  Recall:Fair  Fund of Knowledge:Fair  Language:Fair   Psychomotor Activity  Psychomotor Activity:No data recorded  Musculoskeletal: Strength & Muscle Tone: decreased Gait & Station: unsteady Assets  Assets:No data recorded   Physical Exam: Physical Exam Vitals and nursing note reviewed.    ROS Blood pressure (!) 167/67, pulse 88, temperature 98.1 F (36.7 C), resp. rate 18, height 4' 10 (1.473 m), weight 38.8 kg, SpO2 99%. Body mass index is 17.87 kg/m.  Diagnosis: Principal Problem:   Psychosis (HCC)   PLAN: Safety and Monitoring:  -- Voluntary admission to inpatient psychiatric unit for safety, stabilization and treatment  -- Daily contact with patient to assess and evaluate symptoms and progress in treatment  -- Patient's case to be discussed in multi-disciplinary team meeting  -- Observation Level : q15 minute checks  -- Vital signs:  q12 hours  -- Precautions: suicide, elopement, and assault -- Encouraged patient to participate in unit milieu and in scheduled group therapies  2. Psychiatric Treatment:  Scheduled Medications:    Risperdal  increased to 3 mg twice daily Duloxetine  30 mg daily-increase to 60 mg daily Remeron  15 mg nightly -- The risks/benefits/side-effects/alternatives to this medication were discussed in detail with the patient and time was given for questions. The  patient consents to medication trial.  3. Medical Issues Being Addressed:     4. Discharge Planning:   -- Social work and case management to assist with discharge planning and identification of hospital follow-up needs prior to discharge  -- Estimated LOS: 3-4 days  Allyn Foil, MD 12/01/2024, 11:57 AM

## 2024-12-01 NOTE — Plan of Care (Signed)
" °  Problem: Activity: Goal: Interest or engagement in leisure activities will improve Outcome: Progressing   Problem: Education: Goal: Emotional status will improve Outcome: Progressing Goal: Mental status will improve Outcome: Progressing   Problem: Activity: Goal: Interest or engagement in activities will improve Outcome: Progressing   Problem: Physical Regulation: Goal: Ability to maintain clinical measurements within normal limits will improve Outcome: Progressing   Problem: Safety: Goal: Periods of time without injury will increase Outcome: Progressing   "

## 2024-12-01 NOTE — Progress Notes (Signed)
 Behavior:  Pleasant and cooperative.  Pt reports feeling sleepy.   Psych assessment:  Endorses anxiety and AH.  Denies SI/HI and VH.   Group attendance:  0 / 2.  Medication/ PRNs: Compliant.  Pain: Denies.  15 min checks in place for safety.    Pt spoke to her sister in law this morning.

## 2024-12-01 NOTE — Group Note (Signed)
 Date:  12/01/2024 Time:  10:36 AM  Group Topic/Focus:  Coping With Mental Health Crisis:   The purpose of this group is to help patients identify strategies for coping with mental health crisis.  Group discusses possible causes of crisis and ways to manage them effectively.    Participation Level:  Did Not Attend    Norleen SHAUNNA Bias 12/01/2024, 10:36 AM

## 2024-12-01 NOTE — Progress Notes (Signed)
 Mobility Specialist - Progress Note     12/01/24 1534  Mobility  Activity Ambulated independently;Stood at bedside  Level of Assistance Independent after set-up  Assistive Device Front wheel walker  Distance Ambulated (ft) 650 ft  Range of Motion/Exercises Active  Activity Response Tolerated well  Mobility Referral Yes  Mobility visit 1 Mobility   Pt resting in day room watching movie upon entry. Pt STS and ambulates to hallway around behavior health unit indep after set-up with RW. Pt returned to sitting in day room. Behavior health staff present and pt left with needs in reach.   Guido Rumble Mobility Specialist 12/01/2024, 3:57 PM

## 2024-12-02 DIAGNOSIS — F29 Unspecified psychosis not due to a substance or known physiological condition: Secondary | ICD-10-CM | POA: Diagnosis not present

## 2024-12-02 LAB — COMPREHENSIVE METABOLIC PANEL WITH GFR
ALT: 36 U/L (ref 0–44)
AST: 40 U/L (ref 15–41)
Albumin: 3.9 g/dL (ref 3.5–5.0)
Alkaline Phosphatase: 103 U/L (ref 38–126)
Anion gap: 8 (ref 5–15)
BUN: 39 mg/dL — ABNORMAL HIGH (ref 8–23)
CO2: 27 mmol/L (ref 22–32)
Calcium: 9.1 mg/dL (ref 8.9–10.3)
Chloride: 106 mmol/L (ref 98–111)
Creatinine, Ser: 0.99 mg/dL (ref 0.44–1.00)
GFR, Estimated: 58 mL/min — ABNORMAL LOW
Glucose, Bld: 106 mg/dL — ABNORMAL HIGH (ref 70–99)
Potassium: 4.8 mmol/L (ref 3.5–5.1)
Sodium: 140 mmol/L (ref 135–145)
Total Bilirubin: 0.2 mg/dL (ref 0.0–1.2)
Total Protein: 6.9 g/dL (ref 6.5–8.1)

## 2024-12-02 LAB — AMMONIA: Ammonia: 14 umol/L (ref 9–35)

## 2024-12-02 NOTE — Group Note (Signed)
 Date:  12/02/2024 Time:  2:59 PM  Group Topic/Focus:  Movement Therapy    Participation Level:  Did Not Attend    Sarah Sullivan 12/02/2024, 2:59 PM

## 2024-12-02 NOTE — Group Note (Signed)
 Date:  12/02/2024 Time:  10:48 AM  Group Topic/Focus:  Coping With Mental Health Crisis:   The purpose of this group is to help patients identify strategies for coping with mental health crisis.  Group discusses possible causes of crisis and ways to manage them effectively.    Participation Level:  Did Not Attend   Arland Nutting 12/02/2024, 10:48 AM

## 2024-12-02 NOTE — Progress Notes (Addendum)
 [facility] MD Progress Note  12/02/2024 1:09 PM Sarah Sullivan  MRN:  [id number] Sarah Sullivan is a 79 year old female with a past psychiatric history of depression and mood disturbances, presenting to the ED for worsening auditory hallucinations. She reports hearing voices telling her that people are trying to harm her, and she has been increasingly paranoid. She describes having to whisper while speaking and being hypervigilant at home, checking windows every 15 minutes.Patient is admitted to [facility] unit with Q15 min safety monitoring. Multidisciplinary team approach is offered. Medication management; group/milieu therapy is offered.  11/29/23: Collaterals:Provider and social worker reached out to patient's family-they informed the team about abrupt onset of psychosis in the last few weeks and change in personality lately.  They informed that patient was independent with no mental health history till now.  Provider and the family discussed the possibility of underlying dementia and also discussed PT OT referrals to evaluate the appropriate level of living conditions.  Subjective:  Chart reviewed, case discussed in multidisciplinary meeting, patient seen during rounds.   Today on interview patient is noted to be resting in bed.  She reports feeling sleepy.  Patient is aware of her increasing Risperdal  dosage to 3 mg twice daily to help with the voices.  Patient continues to complain about ongoing hallucinations.  She clarifies that it is a female voices talking all night.  She reports hearing voices even in her sleep.  When asked about the content of the hallucinations she starts to [facility] and declines to use loud words to explain the hallucinations.  She was unable to give clear response to thoughts of suicide and makes gestures of being disturbed.  Per nursing she is able to come out of her room and eat her meals.  She is taking her medications with no reported side effects.  Reviewed care everywhere  : TECHNIQUE: Axial CT images of the head from skull base to vertex without contrast.   FINDINGS:  Mild global cerebral atrophy with ex vacuo dilation of the CSF spaces. There is no midline shift. No mass lesion. There is no evidence of acute infarct. Question prior basal ganglia lacunar infarcts. The sinuses are pneumatized. Bilateral carotid siphon calcifications. Bilateral pseudophakia.   No intracranial hemorrhage or skull fractures.   IMPRESSION:  No acute intracranial abnormality. Question prior basal ganglia lacunar infarcts. Consider MRI.  Past Psychiatric History: see h&P Family History:  Family History  Problem Relation Age of Onset   Breast cancer Neg Hx    Social History:  Social History   Substance and Sexual Activity  Alcohol Use Not Currently     Social History   Substance and Sexual Activity  Drug Use Not Currently    Social History   Socioeconomic History   Marital status: Married    Spouse name: Not on file   Number of children: Not on file   Years of education: Not on file   Highest education level: Not on file  Occupational History   Not on file  Tobacco Use   Smoking status: Never   Smokeless tobacco: Never  Substance and Sexual Activity   Alcohol use: Not Currently   Drug use: Not Currently   Sexual activity: Not on file  Other Topics Concern   Not on file  Social History Narrative   Not on file   Social Drivers of Health   Tobacco Use: Low Risk (11/25/2024)   Patient History    Smoking Tobacco Use: Never    Smokeless Tobacco  Use: Never    Passive Exposure: Not on file  Financial Resource Strain: Low Risk  (10/18/2024)   Received from [facility]   Overall Financial Resource Strain (CARDIA)    Difficulty of Paying Living Expenses: Not hard at all  Food Insecurity: No Food Insecurity (11/25/2024)   Epic    Worried About Running Out of Food in the Last Year: Never true    Ran Out of Food in the Last Year: Never true   Transportation Needs: No Transportation Needs (11/25/2024)   Epic    Lack of Transportation (Medical): No    Lack of Transportation (Non-Medical): No  Physical Activity: Not on file  Stress: Not on file  Social Connections: Socially Isolated (11/25/2024)   Social Connection and Isolation Panel    Frequency of Communication with Friends and Family: Never    Frequency of Social Gatherings with Friends and Family: Never    Attends Religious Services: Never    Database Administrator or Organizations: No    Attends Banker Meetings: Never    Marital Status: Widowed  Depression (PHQ2-9): Not on file  Alcohol Screen: Low Risk (11/24/2024)   Alcohol Screen    Last Alcohol Screening Score (AUDIT): 0  Housing: Low Risk (11/25/2024)   Epic    Unable to Pay for Housing in the Last Year: No    Number of Times Moved in the Last Year: 0    Homeless in the Last Year: No  Utilities: Not At Risk (11/25/2024)   Epic    Threatened with loss of utilities: No  Health Literacy: Not on file   Past Medical History:  Past Medical History:  Diagnosis Date   Anemia    Hypertension     Past Surgical History:  Procedure Laterality Date   ABDOMINAL HYSTERECTOMY     COLONOSCOPY N/A 04/26/2019   Procedure: COLONOSCOPY;  Surgeon: Unk Corinn Skiff, MD;  Location: [facility] ENDOSCOPY;  Service: Gastroenterology;  Laterality: N/A;   COLONOSCOPY N/A 04/27/2019   Procedure: COLONOSCOPY;  Surgeon: Unk Corinn Skiff, MD;  Location: [facility] ENDOSCOPY;  Service: Gastroenterology;  Laterality: N/A;   ESOPHAGOGASTRODUODENOSCOPY N/A 04/25/2019   Procedure: ESOPHAGOGASTRODUODENOSCOPY (EGD);  Surgeon: Unk Corinn Skiff, MD;  Location: [facility] ENDOSCOPY;  Service: Gastroenterology;  Laterality: N/A;   ESOPHAGOGASTRODUODENOSCOPY N/A 04/26/2019   Procedure: ESOPHAGOGASTRODUODENOSCOPY (EGD);  Surgeon: Unk Corinn Skiff, MD;  Location: [facility] ENDOSCOPY;  Service: Gastroenterology;  Laterality: N/A;   TEMPORARY DIALYSIS CATHETER  N/A 04/21/2019   Procedure: TEMPORARY DIALYSIS CATHETER;  Surgeon: Marea Selinda RAMAN, MD;  Location: [facility] INVASIVE CV LAB;  Service: Cardiovascular;  Laterality: N/A;    Current Medications: Current Facility-Administered Medications  Medication Dose Route Frequency Provider Last Rate Last Admin   acetaminophen  (TYLENOL ) tablet 650 mg  650 mg Oral Q6H PRN McLauchlin, Angela, NP   650 mg at 11/24/24 2338   alum & mag hydroxide-simeth (MAALOX/MYLANTA) 200-200-20 MG/5ML suspension 30 mL  30 mL Oral Q4H PRN McLauchlin, Angela, NP   30 mL at 11/29/24 1443   atorvastatin  (LIPITOR ) tablet 80 mg  80 mg Oral Daily Madaram, Kondal R, MD   80 mg at 12/02/24 0814   DULoxetine  (CYMBALTA ) DR capsule 60 mg  60 mg Oral Daily Westyn Keatley, MD   60 mg at 12/02/24 0814   feeding supplement (ENSURE PLUS HIGH PROTEIN) liquid 237 mL  237 mL Oral TID BM Marquette Blodgett, MD   237 mL at 12/02/24 0820   felodipine  (PLENDIL ) 24 hr tablet 10  mg  10 mg Oral Daily Madaram, Kondal R, MD   10 mg at 12/02/24 0815   gabapentin  (NEURONTIN ) capsule 300 mg  300 mg Oral BID Elesa Perkins, RPH   300 mg at 12/02/24 9185   And   gabapentin  (NEURONTIN ) capsule 600 mg  600 mg Oral QHS Elesa Perkins, RPH   600 mg at 12/01/24 2110   irbesartan  (AVAPRO ) tablet 75 mg  75 mg Oral Daily Madaram, Kondal R, MD   75 mg at 12/02/24 9184   magnesium  hydroxide (MILK OF MAGNESIA) suspension 30 mL  30 mL Oral Daily PRN McLauchlin, Angela, NP       mirtazapine  (REMERON ) tablet 15 mg  15 mg Oral QHS Madaram, Kondal R, MD   15 mg at 12/01/24 2110   multivitamin with minerals tablet 1 tablet  1 tablet Oral Daily Calianne Larue, MD   1 tablet at 12/02/24 9184   OLANZapine  zydis (ZYPREXA ) disintegrating tablet 5 mg  5 mg Oral TID PRN McLauchlin, Angela, NP   5 mg at 11/25/24 2318   pantoprazole  (PROTONIX ) EC tablet 40 mg  40 mg Oral BID AC Madaram, Kondal R, MD   40 mg at 12/02/24 9185   risperiDONE  (RISPERDAL ) tablet 3 mg  3 mg Oral BID Azreal Stthomas,  Honorio Devol, MD   3 mg at 12/02/24 0815   thiamine  (VITAMIN B1) tablet 100 mg  100 mg Oral Daily Shadi Larner, MD   100 mg at 12/02/24 0815   traZODone  (DESYREL ) tablet 50 mg  50 mg Oral QHS PRN McLauchlin, Angela, NP   50 mg at 12/01/24 2112    Lab Results: No results found for this or any previous visit (from the past 48 hours).  Blood Alcohol level:  Lab Results  Component Value Date   [facility] <15 11/24/2024    Metabolic Disorder Labs: Lab Results  Component Value Date   HGBA1C 5.3 04/29/2019   MPG 105.41 04/29/2019   No results found for: PROLACTIN Lab Results  Component Value Date   CHOL 175 04/29/2019   TRIG 93 04/29/2019   HDL 68 04/29/2019   CHOLHDL 2.6 04/29/2019   VLDL 19 04/29/2019   LDLCALC 88 04/29/2019    Physical Findings: AIMS:  , ,  ,  ,    CIWA:    COWS:      Psychiatric Specialty Exam:  Presentation  General Appearance: Bizarre  Eye Contact:Fleeting  Speech:Slow; Slurred  Speech Volume:Decreased    Mood and Affect  Mood:Anxious  Affect:Blunt   Thought Process  Thought Processes:Disorganized  Orientation:Partial  Thought Content:Illogical; Paranoid Ideation; Delusions  Hallucinations:auditory hallucinations,  Ideas of Reference: Denies Suicidal Thoughts: Reports passive SI Homicidal Thoughts:denies   Sensorium  Memory:Immediate Fair; Remote Fair  Judgment:Impaired  Insight:Shallow   Executive Functions  Concentration:Poor  Attention Span:Fair  Recall:Fair  Fund of Knowledge:Fair  Language:Fair   Psychomotor Activity  Psychomotor Activity:No data recorded  Musculoskeletal: Strength & Muscle Tone: decreased Gait & Station: unsteady Assets  Assets:No data recorded   Physical Exam: Physical Exam Vitals and nursing note reviewed.    ROS Blood pressure (!) 127/56, pulse 75, temperature (!) 97.4 F (36.3 C), resp. rate 18, height 4' 10 (1.473 m), weight 38.8 kg, SpO2 97%. Body mass index is 17.87  kg/m.  Diagnosis: Principal Problem:   Psychosis (HCC)   PLAN: Safety and Monitoring:  -- Voluntary admission to inpatient psychiatric unit for safety, stabilization and treatment  -- Daily contact with patient to assess and evaluate symptoms and progress  in treatment  -- Patient's case to be discussed in multi-disciplinary team meeting  -- Observation Level : q15 minute checks  -- Vital signs:  q12 hours  -- Precautions: suicide, elopement, and assault -- Encouraged patient to participate in unit milieu and in scheduled group therapies  2. Psychiatric Treatment:  Scheduled Medications:    Risperdal  increased to 3 mg twice daily Duloxetine  30 mg daily-increase to 60 mg daily Remeron  15 mg nightly -- The risks/benefits/side-effects/alternatives to this medication were discussed in detail with the patient and time was given for questions. The patient consents to medication trial.  3. Medical Issues Being Addressed:     4. Discharge Planning:   -- Social work and case management to assist with discharge planning and identification of hospital follow-up needs prior to discharge  -- Estimated LOS: 3-4 days  Allyn Foil, MD 12/02/2024, 1:09 PM

## 2024-12-02 NOTE — Plan of Care (Signed)
  Problem: Coping: Goal: Ability to identify and develop effective coping behavior will improve Outcome: Progressing   

## 2024-12-02 NOTE — Progress Notes (Signed)
" °   12/02/24 0900  Psych Admission Type (Psych Patients Only)  Admission Status Voluntary  Psychosocial Assessment  Patient Complaints None  Eye Contact Fair  Facial Expression Animated  Affect Appropriate to circumstance  Speech Soft  Interaction Assertive  Motor Activity Slow  Appearance/Hygiene In scrubs  Behavior Characteristics Cooperative  Mood Pleasant  Thought Process  Coherency Circumstantial  Content WDL  Delusions None reported or observed  Perception WDL  Hallucination None reported or observed  Judgment WDL  Confusion Mild  Danger to Self  Current suicidal ideation? Denies    "

## 2024-12-02 NOTE — Progress Notes (Incomplete)
 Cooperative with treatment,

## 2024-12-02 NOTE — Progress Notes (Signed)
" °   12/02/24 0712 12/02/24 0936  Vital Signs  BP (!) 166/73 (!) 127/56 (Mao 79)   BP elevated this morning, administered scheduled BP medications and reassessed,  "

## 2024-12-02 NOTE — BH IP Treatment Plan (Signed)
 Interdisciplinary Treatment and Diagnostic Plan Update  12/02/2024 Time of Session: 120pm Reianna Batdorf MRN: [id number]  Principal Diagnosis: Psychosis [facility])  Secondary Diagnoses: Principal Problem:   Psychosis (HCC)   Current Medications:  Current Facility-Administered Medications  Medication Dose Route Frequency Provider Last Rate Last Admin   acetaminophen  (TYLENOL ) tablet 650 mg  650 mg Oral Q6H PRN McLauchlin, Jon, NP   650 mg at 11/24/24 2338   alum & mag hydroxide-simeth (MAALOX/MYLANTA) 200-200-20 MG/5ML suspension 30 mL  30 mL Oral Q4H PRN McLauchlin, Jon, NP   30 mL at 11/29/24 1443   atorvastatin  (LIPITOR ) tablet 80 mg  80 mg Oral Daily Madaram, Kondal R, MD   80 mg at 12/02/24 0814   DULoxetine  (CYMBALTA ) DR capsule 60 mg  60 mg Oral Daily Jadapalle, Sree, MD   60 mg at 12/02/24 0814   feeding supplement (ENSURE PLUS HIGH PROTEIN) liquid 237 mL  237 mL Oral TID BM Jadapalle, Sree, MD   237 mL at 12/02/24 0820   felodipine  (PLENDIL ) 24 hr tablet 10 mg  10 mg Oral Daily Madaram, Kondal R, MD   10 mg at 12/02/24 0815   gabapentin  (NEURONTIN ) capsule 300 mg  300 mg Oral BID Elesa Perkins, RPH   300 mg at 12/02/24 9185   And   gabapentin  (NEURONTIN ) capsule 600 mg  600 mg Oral QHS Elesa Perkins, RPH   600 mg at 12/01/24 2110   irbesartan  (AVAPRO ) tablet 75 mg  75 mg Oral Daily Madaram, Kondal R, MD   75 mg at 12/02/24 9184   magnesium  hydroxide (MILK OF MAGNESIA) suspension 30 mL  30 mL Oral Daily PRN McLauchlin, Angela, NP       mirtazapine  (REMERON ) tablet 15 mg  15 mg Oral QHS Madaram, Kondal R, MD   15 mg at 12/01/24 2110   multivitamin with minerals tablet 1 tablet  1 tablet Oral Daily Jadapalle, Sree, MD   1 tablet at 12/02/24 0815   OLANZapine  zydis (ZYPREXA ) disintegrating tablet 5 mg  5 mg Oral TID PRN McLauchlin, Angela, NP   5 mg at 11/25/24 2318   pantoprazole  (PROTONIX ) EC tablet 40 mg  40 mg Oral BID AC Madaram, Kondal R, MD   40 mg at 12/02/24 9185    risperiDONE  (RISPERDAL ) tablet 3 mg  3 mg Oral BID Jadapalle, Sree, MD   3 mg at 12/02/24 0815   thiamine  (VITAMIN B1) tablet 100 mg  100 mg Oral Daily Jadapalle, Sree, MD   100 mg at 12/02/24 0815   traZODone  (DESYREL ) tablet 50 mg  50 mg Oral QHS PRN McLauchlin, Angela, NP   50 mg at 12/01/24 2112   PTA Medications: Medications Prior to Admission  Medication Sig Dispense Refill Last Dose/Taking   atorvastatin  (LIPITOR ) 80 MG tablet Take 80 mg by mouth daily.      DULoxetine  (CYMBALTA ) 30 MG capsule Take 30 mg by mouth daily.      felodipine  (PLENDIL ) 10 MG 24 hr tablet Take 10 mg by mouth daily.      gabapentin  (NEURONTIN ) 300 MG capsule Take 300-600 mg by mouth 3 (three) times daily. Take 300 mg (one capsule) by mouth twice daily and 600 mg (two capsules) at bedtime      mirtazapine  (REMERON ) 7.5 MG tablet Take 7.5 mg by mouth at bedtime.      pantoprazole  (PROTONIX ) 40 MG tablet Take 1 tablet (40 mg total) by mouth 2 (two) times daily before a meal. 120 tablet 0  sertraline (ZOLOFT) 100 MG tablet Take 100 mg by mouth daily.      telmisartan (MICARDIS) 40 MG tablet Take 40 mg by mouth daily.      traZODone  (DESYREL ) 50 MG tablet Take 50 mg by mouth at bedtime.       Patient Stressors:    Patient Strengths:    Treatment Modalities: Medication Management, Group therapy, Case management,  1 to 1 session with clinician, Psychoeducation, Recreational therapy.   Physician Treatment Plan for Primary Diagnosis: Psychosis (HCC) Long Term Goal(s):     Short Term Goals:    Medication Management: Evaluate patient's response, side effects, and tolerance of medication regimen.  Therapeutic Interventions: 1 to 1 sessions, Unit Group sessions and Medication administration.  Evaluation of Outcomes: Not Progressing  Physician Treatment Plan for Secondary Diagnosis: Principal Problem:   Psychosis (HCC)  Long Term Goal(s):     Short Term Goals:       Medication Management: Evaluate  patient's response, side effects, and tolerance of medication regimen.  Therapeutic Interventions: 1 to 1 sessions, Unit Group sessions and Medication administration.  Evaluation of Outcomes: Progressing   RN Treatment Plan for Primary Diagnosis: Psychosis (HCC) Long Term Goal(s): Knowledge of disease and therapeutic regimen to maintain health will improve  Short Term Goals: Ability to remain free from injury will improve, Ability to verbalize frustration and anger appropriately will improve, Ability to demonstrate self-control, Ability to participate in decision making will improve, Ability to verbalize feelings will improve, Ability to disclose and discuss suicidal ideas, Ability to identify and develop effective coping behaviors will improve, and Compliance with prescribed medications will improve  Medication Management: RN will administer medications as ordered by provider, will assess and evaluate patient's response and provide education to patient for prescribed medication. RN will report any adverse and/or side effects to prescribing provider.  Therapeutic Interventions: 1 on 1 counseling sessions, Psychoeducation, Medication administration, Evaluate responses to treatment, Monitor vital signs and CBGs as ordered, Perform/monitor CIWA, COWS, AIMS and Fall Risk screenings as ordered, Perform wound care treatments as ordered.  Evaluation of Outcomes: Progressing   LCSW Treatment Plan for Primary Diagnosis: Psychosis (HCC) Long Term Goal(s): Safe transition to appropriate next level of care at discharge, Engage patient in therapeutic group addressing interpersonal concerns.  Short Term Goals: Engage patient in aftercare planning with referrals and resources, Increase social support, Increase ability to appropriately verbalize feelings, Increase emotional regulation, Facilitate acceptance of mental health diagnosis and concerns, Facilitate patient progression through stages of change  regarding substance use diagnoses and concerns, Identify triggers associated with mental health/substance abuse issues, and Increase skills for wellness and recovery  Therapeutic Interventions: Assess for all discharge needs, 1 to 1 time with Social worker, Explore available resources and support systems, Assess for adequacy in community support network, Educate family and significant other(s) on suicide prevention, Complete Psychosocial Assessment, Interpersonal group therapy.  Evaluation of Outcomes: Progressing   Progress in Treatment: Attending groups: Yes.and no Participating in groups: Yes.and no Taking medication as prescribed: Yes. Toleration medication: Yes. Family/Significant other contact made: Yes, individual(s) contacted:  sister n law Patient understands diagnosis: No. Discussing patient identified problems/goals with staff: Yes. Medical problems stabilized or resolved: Yes. Denies suicidal/homicidal ideation: Yes. Issues/concerns per patient self-inventory: No. Other: none  New problem(s) identified: No, Describe:  none identified  New Short Term/Long Term Goal(s):elimination of symptoms of psychosis, medication management for mood stabilization; elimination of SI thoughts; development of comprehensive mental wellness plan.   Patient Goals:  Pt  reports she is hearing voices and reports she does not want to, pt has difficulty with speech     Discharge Plan or Barriers:  CSW will assist with appropriate discharge planning   Reason for Continuation of Hospitalization: Hallucinations Medication stabilization  Estimated Length of Stay:1 to 7 days  Last 3 [place] Suicide Severity Risk Score: Flowsheet Row Admission (Current) from 11/24/2024 in [facility] BEHAVIORAL MEDICINE Most recent reading at 11/25/2024 10:00 AM ED from 11/24/2024 in [facility] Emergency Department at [facility] Most recent reading at 11/24/2024  2:27 PM  C-SSRS RISK CATEGORY No Risk No Risk     Last PHQ 2/9 Scores:     No data to display          Scribe for Treatment Team: Pamila Nine, KEN 12/02/2024 1:19 PM

## 2024-12-02 NOTE — Group Note (Signed)
 Date:  12/02/2024 Time:  10:05 PM  Group Topic/Focus:  Healthy Communication:   The focus of this group is to discuss communication, barriers to communication, as well as healthy ways to communicate with others.    Participation Level:  Active  Participation Quality:  Appropriate  Affect:  Appropriate  Cognitive:  Appropriate  Insight: Appropriate and Good  Engagement in Group:  Engaged  Modes of Intervention:  Discussion  Additional Comments:    Sarah Sullivan Hope 12/02/2024, 10:05 PM

## 2024-12-02 NOTE — Group Note (Signed)
 Date:  12/02/2024 Time:  3:52 PM  Group Topic/Focus:  Managing Feelings:   The focus of this group is to identify what feelings patients have difficulty handling and develop a plan to handle them in a healthier way upon discharge.    Participation Level:  Active  Participation Quality:  Appropriate  Affect:  Appropriate  Cognitive:  Appropriate  Insight: Appropriate  Engagement in Group:  Engaged  Modes of Intervention:  Discussion   Arland Nutting 12/02/2024, 3:52 PM

## 2024-12-02 NOTE — Progress Notes (Signed)
 Mobility Specialist - Progress Note    12/02/24 1631  Mobility  Activity Ambulated independently  Level of Assistance Modified independent, requires aide device or extra time  Assistive Device Front wheel walker  Distance Ambulated (ft) 600 ft  Range of Motion/Exercises Active  Activity Response Tolerated well  Mobility Referral Yes  Mobility visit 1 Mobility   Pt resting in day room talking upon entry. Pt STS and ambulates to hallway around NS ModI with RW. Pt returned to day room to watch movie and left with needs in reach.   Guido Rumble Mobility Specialist 12/02/2024, 4:37 PM

## 2024-12-03 ENCOUNTER — Inpatient Hospital Stay

## 2024-12-03 DIAGNOSIS — F29 Unspecified psychosis not due to a substance or known physiological condition: Secondary | ICD-10-CM | POA: Diagnosis not present

## 2024-12-03 NOTE — Progress Notes (Signed)
 Mobility Specialist - Progress Note     12/03/24 1601  Mobility  Activity Ambulated independently;Stood at bedside  Level of Assistance Independent  Assistive Device Front wheel walker  Distance Ambulated (ft) 600 ft  Range of Motion/Exercises Active  Activity Response Tolerated well  Mobility Referral Yes  Mobility visit 1 Mobility     Teresa Nicodemus Mobility Specialist 12/03/2024, 4:15 PM

## 2024-12-03 NOTE — Plan of Care (Signed)
" °  Problem: Education: Goal: Ability to state activities that reduce stress will improve Outcome: Not Progressing   Problem: Coping: Goal: Ability to identify and develop effective coping behavior will improve Outcome: Not Progressing   Problem: Self-Concept: Goal: Ability to identify factors that promote anxiety will improve Outcome: Not Progressing Goal: Level of anxiety will decrease Outcome: Not Progressing   Problem: Education: Goal: Utilization of techniques to improve thought processes will improve Outcome: Not Progressing Goal: Knowledge of the prescribed therapeutic regimen will improve Outcome: Not Progressing   Problem: Activity: Goal: Interest or engagement in leisure activities will improve Outcome: Not Progressing   "

## 2024-12-03 NOTE — Group Note (Signed)
 Date:  12/03/2024 Time:  9:20 PM  Group Topic/Focus:  Wrap-Up Group:   The focus of this group is to help patients review their daily goal of treatment and discuss progress on daily workbooks.    Participation Level:  Minimal  Participation Quality:  Attentive  Affect:  Appropriate  Cognitive:  Appropriate  Insight: Appropriate  Engagement in Group:  Limited  Modes of Intervention:  Discussion  Additional Comments:    Tommas CHRISTELLA Bunker 12/03/2024, 9:20 PM

## 2024-12-03 NOTE — Group Note (Signed)
 Date:  12/03/2024 Time:  10:53 AM  Group Topic/Focus:  Self Care:   The focus of this group is to help patients understand the importance of self-care in order to improve or restore emotional, physical, spiritual, interpersonal, and financial health.    Participation Level:  Did Not Attend  Participation Quality:     Affect:     Cognitive:     Insight: None  Engagement in Group:  None  Modes of Intervention:     Additional Comments:    Mkenzie Dotts C Kamdyn Covel 12/03/2024, 10:53 AM

## 2024-12-03 NOTE — Group Note (Signed)
 [facility] LCSW Group Therapy Note   Group Date: 12/03/2024 Start Time: 1100 End Time: 1130  Type of Therapy and Topic:  Group Therapy:  Feelings around Relapse and Recovery  Participation Level:  Did Not Attend   Mood: Not able to assess.  Description of Group:    Patients in this group will discuss emotions they experience before and after a relapse. They will process how experiencing these feelings, or avoidance of experiencing them, relates to having a relapse. Facilitator will guide patients to explore emotions they have related to recovery. Patients will be encouraged to process which emotions are more powerful. They will be guided to discuss the emotional reaction significant others in their lives may have to patients relapse or recovery. Patients will be assisted in exploring ways to respond to the emotions of others without this contributing to a relapse.  Therapeutic Goals: Patient will identify two or more emotions that lead to relapse for them:  Patient will identify two emotions that result when they relapse:  Patient will identify two emotions related to recovery:  Patient will demonstrate ability to communicate their needs through discussion and/or role plays.   Summary of Patient Progress:  Did not attend group.   Therapeutic Modalities:   Cognitive Behavioral Therapy Solution-Focused Therapy Assertiveness Training Relapse Prevention Therapy   Rexene LELON Mae, LCSWA

## 2024-12-03 NOTE — Group Note (Signed)
 Date:  12/03/2024 Time:  3:16 PM  Group Topic/Focus:  House rules and unit expectations    Participation Level:  Did Not Attend    Sarah Sullivan Bias 12/03/2024, 3:16 PM

## 2024-12-03 NOTE — Progress Notes (Signed)
 [facility] MD Progress Note  12/03/2024 1:05 PM Sarah Sullivan  MRN:  [id number] Sarah Sullivan is a 79 year old female with a past psychiatric history of depression and mood disturbances, presenting to the ED for worsening auditory hallucinations. She reports hearing voices telling her that people are trying to harm her, and she has been increasingly paranoid. She describes having to whisper while speaking and being hypervigilant at home, checking windows every 15 minutes.Patient is admitted to [facility] unit with Q15 min safety monitoring. Multidisciplinary team approach is offered. Medication management; group/milieu therapy is offered.  11/29/23: Collaterals:Provider and social worker reached out to patient's family-they informed the team about abrupt onset of psychosis in the last few weeks and change in personality lately.  They informed that patient was independent with no mental health history till now.  Provider and the family discussed the possibility of underlying dementia and also discussed PT OT referrals to evaluate the appropriate level of living conditions.  Subjective:  Chart reviewed, case discussed in multidisciplinary meeting, patient seen during rounds.   Patient is noted to be resting in bed.  When she is in the dayroom and interacting with other peers she is very pleasant and calm and happy.  She is not responding to internal stimuli.  She continues to report ongoing voices in her right ear.  Patient and provider discussed at length about her previous neurological assessments, CT scan head, ENT examinations where she was found to have bilateral eardrum punctures and evidence of brain changes/atrophy raising concerns for dementia.  Patient denies SI/HI/plan.  Patient is participating in groups and is able to eat and sleep.  Reviewed care everywhere : TECHNIQUE: Axial CT images of the head from skull base to vertex without contrast.   FINDINGS:  Mild global cerebral atrophy with ex vacuo  dilation of the CSF spaces. There is no midline shift. No mass lesion. There is no evidence of acute infarct. Question prior basal ganglia lacunar infarcts. The sinuses are pneumatized. Bilateral carotid siphon calcifications. Bilateral pseudophakia.   No intracranial hemorrhage or skull fractures.   IMPRESSION:  No acute intracranial abnormality. Question prior basal ganglia lacunar infarcts. Consider MRI.  Past Psychiatric History: see h&P Family History:  Family History  Problem Relation Age of Onset   Breast cancer Neg Hx    Social History:  Social History   Substance and Sexual Activity  Alcohol Use Not Currently     Social History   Substance and Sexual Activity  Drug Use Not Currently    Social History   Socioeconomic History   Marital status: Married    Spouse name: Not on file   Number of children: Not on file   Years of education: Not on file   Highest education level: Not on file  Occupational History   Not on file  Tobacco Use   Smoking status: Never   Smokeless tobacco: Never  Substance and Sexual Activity   Alcohol use: Not Currently   Drug use: Not Currently   Sexual activity: Not on file  Other Topics Concern   Not on file  Social History Narrative   Not on file   Social Drivers of Health   Tobacco Use: Low Risk (11/25/2024)   Patient History    Smoking Tobacco Use: Never    Smokeless Tobacco Use: Never    Passive Exposure: Not on file  Financial Resource Strain: Low Risk  (10/18/2024)   Received from [facility]   Overall Financial Resource Strain (CARDIA)  Difficulty of Paying Living Expenses: Not hard at all  Food Insecurity: No Food Insecurity (11/25/2024)   Epic    Worried About Programme Researcher, Broadcasting/film/video in the Last Year: Never true    Ran Out of Food in the Last Year: Never true  Transportation Needs: No Transportation Needs (11/25/2024)   Epic    Lack of Transportation (Medical): No    Lack of Transportation (Non-Medical):  No  Physical Activity: Not on file  Stress: Not on file  Social Connections: Socially Isolated (11/25/2024)   Social Connection and Isolation Panel    Frequency of Communication with Friends and Family: Never    Frequency of Social Gatherings with Friends and Family: Never    Attends Religious Services: Never    Database Administrator or Organizations: No    Attends Banker Meetings: Never    Marital Status: Widowed  Depression (PHQ2-9): Not on file  Alcohol Screen: Low Risk (11/24/2024)   Alcohol Screen    Last Alcohol Screening Score (AUDIT): 0  Housing: Low Risk (11/25/2024)   Epic    Unable to Pay for Housing in the Last Year: No    Number of Times Moved in the Last Year: 0    Homeless in the Last Year: No  Utilities: Not At Risk (11/25/2024)   Epic    Threatened with loss of utilities: No  Health Literacy: Not on file   Past Medical History:  Past Medical History:  Diagnosis Date   Anemia    Hypertension     Past Surgical History:  Procedure Laterality Date   ABDOMINAL HYSTERECTOMY     COLONOSCOPY N/A 04/26/2019   Procedure: COLONOSCOPY;  Surgeon: Unk Corinn Skiff, MD;  Location: [facility] ENDOSCOPY;  Service: Gastroenterology;  Laterality: N/A;   COLONOSCOPY N/A 04/27/2019   Procedure: COLONOSCOPY;  Surgeon: Unk Corinn Skiff, MD;  Location: [facility] ENDOSCOPY;  Service: Gastroenterology;  Laterality: N/A;   ESOPHAGOGASTRODUODENOSCOPY N/A 04/25/2019   Procedure: ESOPHAGOGASTRODUODENOSCOPY (EGD);  Surgeon: Unk Corinn Skiff, MD;  Location: [facility] ENDOSCOPY;  Service: Gastroenterology;  Laterality: N/A;   ESOPHAGOGASTRODUODENOSCOPY N/A 04/26/2019   Procedure: ESOPHAGOGASTRODUODENOSCOPY (EGD);  Surgeon: Unk Corinn Skiff, MD;  Location: [facility] ENDOSCOPY;  Service: Gastroenterology;  Laterality: N/A;   TEMPORARY DIALYSIS CATHETER N/A 04/21/2019   Procedure: TEMPORARY DIALYSIS CATHETER;  Surgeon: Marea Selinda RAMAN, MD;  Location: [facility] INVASIVE CV LAB;  Service: Cardiovascular;   Laterality: N/A;    Current Medications: Current Facility-Administered Medications  Medication Dose Route Frequency Provider Last Rate Last Admin   acetaminophen  (TYLENOL ) tablet 650 mg  650 mg Oral Q6H PRN McLauchlin, Angela, NP   650 mg at 11/24/24 2338   alum & mag hydroxide-simeth (MAALOX/MYLANTA) 200-200-20 MG/5ML suspension 30 mL  30 mL Oral Q4H PRN McLauchlin, Angela, NP   30 mL at 11/29/24 1443   atorvastatin  (LIPITOR ) tablet 80 mg  80 mg Oral Daily Madaram, Kondal R, MD   80 mg at 12/03/24 0950   DULoxetine  (CYMBALTA ) DR capsule 60 mg  60 mg Oral Daily Odena Mcquaid, MD   60 mg at 12/03/24 0950   feeding supplement (ENSURE PLUS HIGH PROTEIN) liquid 237 mL  237 mL Oral TID BM Lajoya Dombek, MD   237 mL at 12/03/24 0954   felodipine  (PLENDIL ) 24 hr tablet 10 mg  10 mg Oral Daily Madaram, Kondal R, MD   10 mg at 12/03/24 0950   gabapentin  (NEURONTIN ) capsule 300 mg  300 mg Oral BID Elesa Perkins, RPH   300 mg  at 12/03/24 0751   And   gabapentin  (NEURONTIN ) capsule 600 mg  600 mg Oral QHS Elesa Perkins, RPH   600 mg at 12/02/24 2056   irbesartan  (AVAPRO ) tablet 75 mg  75 mg Oral Daily Madaram, Kondal R, MD   75 mg at 12/03/24 9049   magnesium  hydroxide (MILK OF MAGNESIA) suspension 30 mL  30 mL Oral Daily PRN McLauchlin, Jon, NP       mirtazapine  (REMERON ) tablet 15 mg  15 mg Oral QHS Madaram, Kondal R, MD   15 mg at 12/02/24 2056   multivitamin with minerals tablet 1 tablet  1 tablet Oral Daily Karista Aispuro, MD   1 tablet at 12/03/24 0950   OLANZapine  zydis (ZYPREXA ) disintegrating tablet 5 mg  5 mg Oral TID PRN McLauchlin, Angela, NP   5 mg at 11/25/24 2318   pantoprazole  (PROTONIX ) EC tablet 40 mg  40 mg Oral BID AC Madaram, Kondal R, MD   40 mg at 12/03/24 0751   risperiDONE  (RISPERDAL ) tablet 3 mg  3 mg Oral BID Kellen Hover, MD   3 mg at 12/03/24 9049   thiamine  (VITAMIN B1) tablet 100 mg  100 mg Oral Daily Evolett Somarriba, MD   100 mg at 12/03/24 0950   traZODone   (DESYREL ) tablet 50 mg  50 mg Oral QHS PRN McLauchlin, Angela, NP   50 mg at 12/02/24 2056    Lab Results:  Results for orders placed or performed during the hospital encounter of 11/24/24 (from the past 48 hours)  Ammonia     Status: None   Collection Time: 12/02/24  8:37 PM  Result Value Ref Range   Ammonia 14 9 - 35 umol/L    Comment: Performed at [facility], [address] [place], [place] [address]  Comprehensive metabolic panel     Status: Abnormal   Collection Time: 12/02/24  8:37 PM  Result Value Ref Range   Sodium 140 135 - 145 mmol/L   Potassium 4.8 3.5 - 5.1 mmol/L   Chloride 106 98 - 111 mmol/L   CO2 27 22 - 32 mmol/L   Glucose, Bld 106 (H) 70 - 99 mg/dL    Comment: Glucose reference range applies only to samples taken after fasting for at least 8 hours.   BUN 39 (H) 8 - 23 mg/dL   Creatinine, Ser 9.00 0.44 - 1.00 mg/dL   Calcium  9.1 8.9 - 10.3 mg/dL   Total Protein 6.9 6.5 - 8.1 g/dL   Albumin  3.9 3.5 - 5.0 g/dL   AST 40 15 - 41 U/L   ALT 36 0 - 44 U/L   Alkaline Phosphatase 103 38 - 126 U/L   Total Bilirubin 0.2 0.0 - 1.2 mg/dL   GFR, Estimated 58 (L) >60 mL/min    Comment: (NOTE) Calculated using the CKD-EPI Creatinine Equation (2021)    Anion gap 8 5 - 15    Comment: Performed at [facility], [address] [place], [place] [address]    Blood Alcohol level:  Lab Results  Component Value Date   [facility] <15 11/24/2024    Metabolic Disorder Labs: Lab Results  Component Value Date   HGBA1C 5.3 04/29/2019   MPG 105.41 04/29/2019   No results found for: PROLACTIN Lab Results  Component Value Date   CHOL 175 04/29/2019   TRIG 93 04/29/2019   HDL 68 04/29/2019   CHOLHDL 2.6 04/29/2019   VLDL 19 04/29/2019   LDLCALC 88 04/29/2019    Physical  Findings: AIMS:  , ,  ,  ,    CIWA:    COWS:      Psychiatric Specialty Exam:  Presentation  General Appearance: Stated age Eye Contact:Fleeting  Speech:Slow; Slurred  Speech  Volume:Decreased    Mood and Affect  Mood: Fine Affect:Blunt   Thought Process  Thought Processes: Linear Orientation:Partial  Thought Content:Illogical; Paranoid Ideation; Delusions  Hallucinations:auditory hallucinations,  Ideas of Reference: Denies Suicidal Thoughts: Reports passive SI Homicidal Thoughts:denies   Sensorium  Memory:Immediate Fair; Remote Fair  Judgment:Impaired  Insight:Shallow   Executive Functions  Concentration:Poor  Attention Span:Fair  Recall:Fair  Fund of Knowledge:Fair  Language:Fair   Psychomotor Activity  Psychomotor Activity:No data recorded  Musculoskeletal: Strength & Muscle Tone: decreased Gait & Station: unsteady Assets  Assets:No data recorded   Physical Exam: Physical Exam Vitals and nursing note reviewed.    ROS Blood pressure 122/61, pulse 88, temperature 98.1 F (36.7 C), resp. rate 16, height 4' 10 (1.473 m), weight 38.8 kg, SpO2 99%. Body mass index is 17.87 kg/m.  Diagnosis: Principal Problem:   Psychosis (HCC)   PLAN: Safety and Monitoring:  -- Voluntary admission to inpatient psychiatric unit for safety, stabilization and treatment  -- Daily contact with patient to assess and evaluate symptoms and progress in treatment  -- Patient's case to be discussed in multi-disciplinary team meeting  -- Observation Level : q15 minute checks  -- Vital signs:  q12 hours  -- Precautions: suicide, elopement, and assault -- Encouraged patient to participate in unit milieu and in scheduled group therapies  2. Psychiatric Treatment:  Scheduled Medications:    Risperdal  increased to 3 mg twice daily Duloxetine  30 mg daily-increase to 60 mg daily Remeron  15 mg nightly -- The risks/benefits/side-effects/alternatives to this medication were discussed in detail with the patient and time was given for questions. The patient consents to medication trial.  3. Medical Issues Being Addressed:     4. Discharge  Planning:   -- Social work and case management to assist with discharge planning and identification of hospital follow-up needs prior to discharge  -- Estimated LOS: 3-4 days  Allyn Foil, MD 12/03/2024, 1:05 PM

## 2024-12-03 NOTE — Plan of Care (Signed)
?  Problem: Education: ?Goal: Ability to state activities that reduce stress will improve ?Outcome: Progressing ?  ?Problem: Coping: ?Goal: Ability to identify and develop effective coping behavior will improve ?Outcome: Progressing ?  ?Problem: Self-Concept: ?Goal: Ability to identify factors that promote anxiety will improve ?Outcome: Progressing ?Goal: Level of anxiety will decrease ?Outcome: Progressing ?Goal: Ability to modify response to factors that promote anxiety will improve ?Outcome: Progressing ?  ?Problem: Education: ?Goal: Utilization of techniques to improve thought processes will improve ?Outcome: Progressing ?Goal: Knowledge of the prescribed therapeutic regimen will improve ?Outcome: Progressing ?  ?Problem: Activity: ?Goal: Interest or engagement in leisure activities will improve ?Outcome: Progressing ?Goal: Imbalance in normal sleep/wake cycle will improve ?Outcome: Progressing ?  ?

## 2024-12-03 NOTE — Progress Notes (Signed)
 Patient pleasant and talkative during interaction. Denies SI/HI and AVH earlier in shift. At this time she reports AH 'sometimes, just a little bit'. Patient accepted medications without issue. Throughout shift observed patient in dayroom for meals and group conversing with peers. Ongoing monitoring continues  12/03/24 0900  Psych Admission Type (Psych Patients Only)  Admission Status Voluntary  Psychosocial Assessment  Patient Complaints None  Eye Contact Fair  Facial Expression Animated  Affect Appropriate to circumstance  Speech Soft  Interaction Assertive  Motor Activity Slow;Unsteady  Appearance/Hygiene In scrubs;Unremarkable  Behavior Characteristics Cooperative  Mood Pleasant  Thought Process  Coherency Circumstantial  Content WDL  Delusions None reported or observed  Perception WDL  Hallucination None reported or observed  Judgment WDL  Confusion Mild  Danger to Others  Danger to Others None reported or observed

## 2024-12-04 DIAGNOSIS — F29 Unspecified psychosis not due to a substance or known physiological condition: Secondary | ICD-10-CM | POA: Diagnosis not present

## 2024-12-04 LAB — THYROID ANTIBODIES (THYROPEROXIDASE & THYROGLOBULIN)
Thyroglobulin Antibody: <1 [IU]/mL (ref 0.0–0.9)
Thyroperoxidase Ab SerPl-aCnc: <9 [IU]/mL (ref 0–34)

## 2024-12-04 LAB — THYROID PANEL WITH TSH
Free Thyroxine Index: 1.6 (ref 1.2–4.9)
T3 Uptake Ratio: 25 % (ref 24–39)
T4, Total: 6.5 ug/dL (ref 4.5–12.0)
TSH: 2.05 u[IU]/mL (ref 0.450–4.500)

## 2024-12-04 NOTE — Plan of Care (Signed)
" °  Problem: Self-Concept: Goal: Ability to identify factors that promote anxiety will improve Outcome: Progressing Goal: Level of anxiety will decrease Outcome: Progressing Goal: Ability to modify response to factors that promote anxiety will improve Outcome: Progressing   Problem: Education: Goal: Utilization of techniques to improve thought processes will improve Outcome: Progressing Goal: Knowledge of the prescribed therapeutic regimen will improve Outcome: Progressing   Problem: Health Behavior/Discharge Planning: Goal: Ability to make decisions will improve Outcome: Progressing Goal: Compliance with therapeutic regimen will improve Outcome: Progressing   Problem: Self-Care: Goal: Ability to participate in self-care as condition permits will improve Outcome: Progressing   "

## 2024-12-04 NOTE — Progress Notes (Signed)
 [facility] MD Progress Note  12/04/2024 1:31 PM Sarah Sullivan  MRN:  [id number] Sarah Sullivan is a 79 year old female with a past psychiatric history of depression and mood disturbances, presenting to the ED for worsening auditory hallucinations. She reports hearing voices telling her that people are trying to harm her, and she has been increasingly paranoid. She describes having to whisper while speaking and being hypervigilant at home, checking windows every 15 minutes.Patient is admitted to [facility] unit with Q15 min safety monitoring. Multidisciplinary team approach is offered. Medication management; group/milieu therapy is offered.  11/29/23: Collaterals:Provider and social worker reached out to patient's family-they informed the team about abrupt onset of psychosis in the last few weeks and change in personality lately.  They informed that patient was independent with no mental health history till now.  Provider and the family discussed the possibility of underlying dementia and also discussed PT OT referrals to evaluate the appropriate level of living conditions.  Subjective:  Chart reviewed, case discussed in multidisciplinary meeting, patient seen during rounds.   Patient is noted to be resting in bed.  She reports good night sleep and fair appetite.  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  She reports that home she was cooking for herself.  She reports her sister-in-law is her main support but patient lives by herself.  Provider and patient discussed at length the OT recommendations of patient needing supervision and help in daily activities.  Patient denies auditory/visual hallucinations today and denies SI/HI/plan.  She is noted to be participating in groups, interacting with peers with no problems. Reviewed care everywhere : TECHNIQUE: Axial CT images of the head from skull base to vertex without contrast.   FINDINGS:  Mild global cerebral atrophy with ex vacuo dilation of the  CSF spaces. There is no midline shift. No mass lesion. There is no evidence of acute infarct. Question prior basal ganglia lacunar infarcts. The sinuses are pneumatized. Bilateral carotid siphon calcifications. Bilateral pseudophakia.   No intracranial hemorrhage or skull fractures.   IMPRESSION:  No acute intracranial abnormality. Question prior basal ganglia lacunar infarcts. Consider MRI.  Past Psychiatric History: see h&P Family History:  Family History  Problem Relation Age of Onset   Breast cancer Neg Hx    Social History:  Social History   Substance and Sexual Activity  Alcohol Use Not Currently     Social History   Substance and Sexual Activity  Drug Use Not Currently    Social History   Socioeconomic History   Marital status: Married    Spouse name: Not on file   Number of children: Not on file   Years of education: Not on file   Highest education level: Not on file  Occupational History   Not on file  Tobacco Use   Smoking status: Never   Smokeless tobacco: Never  Substance and Sexual Activity   Alcohol use: Not Currently   Drug use: Not Currently   Sexual activity: Not on file  Other Topics Concern   Not on file  Social History Narrative   Not on file   Social Drivers of Health   Tobacco Use: Low Risk (11/25/2024)   Patient History    Smoking Tobacco Use: Never    Smokeless Tobacco Use: Never    Passive Exposure: Not on file  Financial Resource Strain: Low Risk  (10/18/2024)   Received from [facility]   Overall Financial Resource Strain (CARDIA)  Difficulty of Paying Living Expenses: Not hard at all  Food Insecurity: No Food Insecurity (11/25/2024)   Epic    Worried About Programme Researcher, Broadcasting/film/video in the Last Year: Never true    Ran Out of Food in the Last Year: Never true  Transportation Needs: No Transportation Needs (11/25/2024)   Epic    Lack of Transportation (Medical): No    Lack of Transportation (Non-Medical): No  Physical  Activity: Not on file  Stress: Not on file  Social Connections: Socially Isolated (11/25/2024)   Social Connection and Isolation Panel    Frequency of Communication with Friends and Family: Never    Frequency of Social Gatherings with Friends and Family: Never    Attends Religious Services: Never    Database Administrator or Organizations: No    Attends Banker Meetings: Never    Marital Status: Widowed  Depression (PHQ2-9): Not on file  Alcohol Screen: Low Risk (11/24/2024)   Alcohol Screen    Last Alcohol Screening Score (AUDIT): 0  Housing: Low Risk (11/25/2024)   Epic    Unable to Pay for Housing in the Last Year: No    Number of Times Moved in the Last Year: 0    Homeless in the Last Year: No  Utilities: Not At Risk (11/25/2024)   Epic    Threatened with loss of utilities: No  Health Literacy: Not on file   Past Medical History:  Past Medical History:  Diagnosis Date   Anemia    Hypertension     Past Surgical History:  Procedure Laterality Date   ABDOMINAL HYSTERECTOMY     COLONOSCOPY N/A 04/26/2019   Procedure: COLONOSCOPY;  Surgeon: Unk Corinn Skiff, MD;  Location: [facility] ENDOSCOPY;  Service: Gastroenterology;  Laterality: N/A;   COLONOSCOPY N/A 04/27/2019   Procedure: COLONOSCOPY;  Surgeon: Unk Corinn Skiff, MD;  Location: [facility] ENDOSCOPY;  Service: Gastroenterology;  Laterality: N/A;   ESOPHAGOGASTRODUODENOSCOPY N/A 04/25/2019   Procedure: ESOPHAGOGASTRODUODENOSCOPY (EGD);  Surgeon: Unk Corinn Skiff, MD;  Location: [facility] ENDOSCOPY;  Service: Gastroenterology;  Laterality: N/A;   ESOPHAGOGASTRODUODENOSCOPY N/A 04/26/2019   Procedure: ESOPHAGOGASTRODUODENOSCOPY (EGD);  Surgeon: Unk Corinn Skiff, MD;  Location: [facility] ENDOSCOPY;  Service: Gastroenterology;  Laterality: N/A;   TEMPORARY DIALYSIS CATHETER N/A 04/21/2019   Procedure: TEMPORARY DIALYSIS CATHETER;  Surgeon: Marea Selinda RAMAN, MD;  Location: [facility] INVASIVE CV LAB;  Service: Cardiovascular;  Laterality: N/A;     Current Medications: Current Facility-Administered Medications  Medication Dose Route Frequency Provider Last Rate Last Admin   acetaminophen  (TYLENOL ) tablet 650 mg  650 mg Oral Q6H PRN McLauchlin, Angela, NP   650 mg at 11/24/24 2338   alum & mag hydroxide-simeth (MAALOX/MYLANTA) 200-200-20 MG/5ML suspension 30 mL  30 mL Oral Q4H PRN McLauchlin, Angela, NP   30 mL at 11/29/24 1443   atorvastatin  (LIPITOR ) tablet 80 mg  80 mg Oral Daily Madaram, Kondal R, MD   80 mg at 12/04/24 1101   DULoxetine  (CYMBALTA ) DR capsule 60 mg  60 mg Oral Daily Werner Labella, MD   60 mg at 12/04/24 1100   feeding supplement (ENSURE PLUS HIGH PROTEIN) liquid 237 mL  237 mL Oral TID BM Ronit Cranfield, MD   237 mL at 12/04/24 1101   felodipine  (PLENDIL ) 24 hr tablet 10 mg  10 mg Oral Daily Madaram, Kondal R, MD   10 mg at 12/04/24 1101   gabapentin  (NEURONTIN ) capsule 300 mg  300 mg Oral BID Elesa Perkins, RPH   300 mg  at 12/04/24 9196   And   gabapentin  (NEURONTIN ) capsule 600 mg  600 mg Oral QHS Elesa Perkins, RPH   600 mg at 12/03/24 2104   irbesartan  (AVAPRO ) tablet 75 mg  75 mg Oral Daily Madaram, Kondal R, MD   75 mg at 12/04/24 1100   magnesium  hydroxide (MILK OF MAGNESIA) suspension 30 mL  30 mL Oral Daily PRN McLauchlin, Angela, NP       mirtazapine  (REMERON ) tablet 15 mg  15 mg Oral QHS Madaram, Kondal R, MD   15 mg at 12/03/24 2105   multivitamin with minerals tablet 1 tablet  1 tablet Oral Daily Dalynn Jhaveri, MD   1 tablet at 12/04/24 1101   OLANZapine  zydis (ZYPREXA ) disintegrating tablet 5 mg  5 mg Oral TID PRN McLauchlin, Angela, NP   5 mg at 11/25/24 2318   pantoprazole  (PROTONIX ) EC tablet 40 mg  40 mg Oral BID AC Madaram, Kondal R, MD   40 mg at 12/04/24 0803   risperiDONE  (RISPERDAL ) tablet 3 mg  3 mg Oral BID Adaira Centola, MD   3 mg at 12/04/24 1101   traZODone  (DESYREL ) tablet 50 mg  50 mg Oral QHS PRN McLauchlin, Angela, NP   50 mg at 12/02/24 2056    Lab Results:  Results  for orders placed or performed during the hospital encounter of 11/24/24 (from the past 48 hours)  Ammonia     Status: None   Collection Time: 12/02/24  8:37 PM  Result Value Ref Range   Ammonia 14 9 - 35 umol/L    Comment: Performed at [facility], [address] [place], [place] [address]  Comprehensive metabolic panel     Status: Abnormal   Collection Time: 12/02/24  8:37 PM  Result Value Ref Range   Sodium 140 135 - 145 mmol/L   Potassium 4.8 3.5 - 5.1 mmol/L   Chloride 106 98 - 111 mmol/L   CO2 27 22 - 32 mmol/L   Glucose, Bld 106 (H) 70 - 99 mg/dL    Comment: Glucose reference range applies only to samples taken after fasting for at least 8 hours.   BUN 39 (H) 8 - 23 mg/dL   Creatinine, Ser 9.00 0.44 - 1.00 mg/dL   Calcium  9.1 8.9 - 10.3 mg/dL   Total Protein 6.9 6.5 - 8.1 g/dL   Albumin  3.9 3.5 - 5.0 g/dL   AST 40 15 - 41 U/L   ALT 36 0 - 44 U/L   Alkaline Phosphatase 103 38 - 126 U/L   Total Bilirubin 0.2 0.0 - 1.2 mg/dL   GFR, Estimated 58 (L) >60 mL/min    Comment: (NOTE) Calculated using the CKD-EPI Creatinine Equation (2021)    Anion gap 8 5 - 15    Comment: Performed at [facility], [address] [place], [place] [address]  Thyroid  Panel With TSH     Status: None   Collection Time: 12/02/24  8:37 PM  Result Value Ref Range   TSH 2.050 0.450 - 4.500 uIU/mL   T4, Total 6.5 4.5 - 12.0 ug/dL   T3 Uptake Ratio 25 24 - 39 %   Free Thyroxine Index 1.6 1.2 - 4.9    Comment: (NOTE) Performed At: [facility] [address] [place], [place] [id number] Jennette Shorter MD Ey:[id number]     Blood Alcohol level:  Lab Results  Component Value Date   [facility] <15 11/24/2024    Metabolic Disorder Labs: Lab Results  Component Value  Date   HGBA1C 5.3 04/29/2019   MPG 105.41 04/29/2019   No results found for: PROLACTIN Lab Results  Component Value Date   CHOL 175 04/29/2019   TRIG 93 04/29/2019   HDL 68 04/29/2019   CHOLHDL 2.6  04/29/2019   VLDL 19 04/29/2019   LDLCALC 88 04/29/2019    Physical Findings: AIMS:  , ,  ,  ,    CIWA:    COWS:      Psychiatric Specialty Exam:  Presentation  General Appearance: Stated age Eye Contact:Fleeting  Speech:Slow; Slurred  Speech Volume:Decreased    Mood and Affect  Mood: Fine Affect:Blunt   Thought Process  Thought Processes: Linear Orientation:Partial  Thought Content:Illogical; Paranoid Ideation; Delusions  Hallucinations:auditory hallucinations,  Ideas of Reference: Denies Suicidal Thoughts: Reports passive SI Homicidal Thoughts:denies   Sensorium  Memory:Immediate Fair; Remote Fair  Judgment:Impaired  Insight:Shallow   Executive Functions  Concentration:Poor  Attention Span:Fair  Recall:Fair  Fund of Knowledge:Fair  Language:Fair   Psychomotor Activity  Psychomotor Activity:No data recorded  Musculoskeletal: Strength & Muscle Tone: decreased Gait & Station: unsteady Assets  Assets:No data recorded   Physical Exam: Physical Exam Vitals and nursing note reviewed.    ROS Blood pressure (!) 148/71, pulse 82, temperature 98.2 F (36.8 C), resp. rate 15, height 4' 10 (1.473 m), weight 38.8 kg, SpO2 98%. Body mass index is 17.87 kg/m.  Diagnosis: Principal Problem:   Psychosis (HCC)   PLAN: Safety and Monitoring:  -- Voluntary admission to inpatient psychiatric unit for safety, stabilization and treatment  -- Daily contact with patient to assess and evaluate symptoms and progress in treatment  -- Patient's case to be discussed in multi-disciplinary team meeting  -- Observation Level : q15 minute checks  -- Vital signs:  q12 hours  -- Precautions: suicide, elopement, and assault -- Encouraged patient to participate in unit milieu and in scheduled group therapies  2. Psychiatric Treatment:  Scheduled Medications:    Risperdal  increased to 3 mg twice daily Duloxetine  30 mg daily-increase to 60 mg daily Remeron   15 mg nightly -- The risks/benefits/side-effects/alternatives to this medication were discussed in detail with the patient and time was given for questions. The patient consents to medication trial.  3. Medical Issues Being Addressed:     4. Discharge Planning:   -- Social work and case management to assist with discharge planning and identification of hospital follow-up needs prior to discharge  -- Estimated LOS: 3-4 days  Allyn Foil, MD 12/04/2024, 1:31 PM

## 2024-12-04 NOTE — Group Note (Signed)
 Date:  12/04/2024 Time:  11:05 PM  Group Topic/Focus:  Wrap-Up Group:   The focus of this group is to help patients review their daily goal of treatment and discuss progress on daily workbooks.  Boundaries Group  Participation Level:  Active  Participation Quality:  Appropriate  Affect:  Appropriate  Cognitive:  Appropriate  Insight: Appropriate  Engagement in Group:  Engaged  Modes of Intervention:  support  Additional Comments:    Gwendlyn LITTIE Sharps 12/04/2024, 11:05 PM

## 2024-12-04 NOTE — Progress Notes (Signed)
 Behavior:  Pleasant and cooperative.  Pt states she feels happy today.    Psych assessment:  Denies SI/HI and AVH.    Group attendance:  1/2  Medication/ PRNs: Compliant.  PRN medication given for upset stomach.   Pain: Denies.  15 min checks in place for safety.

## 2024-12-04 NOTE — Plan of Care (Signed)
  Problem: Activity: Goal: Sleeping patterns will improve Outcome: Progressing   

## 2024-12-04 NOTE — BHH Counselor (Signed)
 CSW received 3 calls from pt's sister-in-law [name].   CSW returned missed calls from [place].   [name] expresses concern about pt's discharge plan.   CSW explained that pt needs supervision, [name] reports pt can stay with her moving forward.   [name] asks that provider call with clinical updates regarding pt's medication and brain scan.  CSW informed provider, provider reports she will call.   [name] reports she will not be able to pick up pt until Friday, reports she needs time to prepare things for pt.   CSW informed care team.   [name], MSW, [facility] 12/05/2024 11:30 AM

## 2024-12-04 NOTE — Plan of Care (Signed)
  Problem: Education: Goal: Knowledge of the prescribed therapeutic regimen will improve Outcome: Progressing   Problem: Coping: Goal: Coping ability will improve Outcome: Progressing   Problem: Health Behavior/Discharge Planning: Goal: Compliance with therapeutic regimen will improve Outcome: Progressing   

## 2024-12-04 NOTE — Group Note (Signed)
 Recreation Therapy Group Note   Group Topic:Stress Management  Group Date: 12/04/2024 Start Time: 1415 End Time: 1440 Facilitators: Celestia Jeoffrey BRAVO, LRT, CTRS Location: Dayroom  Group Description: PMR (Progressive Muscle Relaxation). LRT educates patients on what PMR is and the benefits that come from it. Patients are asked to sit with their feet flat on the floor while sitting up and all the way back in their chair, if possible. LRT and pts follow a prompt through a speaker that requires you to tense and release different muscles in their body and focus on their breathing. During session, lights are off and soft music is being played. Pts are given a stress ball to use if needed.   Goal Area(s) Addressed:  Patients will be able to describe progressive muscle relaxation.  Patient will practice using relaxation technique. Patient will identify a new coping skill.  Patient will follow multistep directions to reduce anxiety and stress.   Affect/Mood: Appropriate   Participation Level: Moderate    Clinical Observations/Individualized Feedback: Sarah Sullivan was originally present in group. Pt left and did not return. Pt was present for less than half the session.   Plan: Continue to engage patient in RT group sessions 2-3x/week.   Jeoffrey BRAVO Celestia, LRT, CTRS 12/04/2024 4:37 PM

## 2024-12-04 NOTE — Group Note (Signed)
 Date:  12/04/2024 Time:  10:52 AM  Group Topic/Focus:  Self Care:   The focus of this group is to help patients understand the importance of self-care in order to improve or restore emotional, physical, spiritual, interpersonal, and financial health.    Participation Level:  Did not attend  Participation Quality:    Affect:    Cognitive:    Insight:   Engagement in Group:    Modes of Intervention:    Additional Comments:    Lee-Ann Gal 12/04/2024, 10:52 AM

## 2024-12-05 DIAGNOSIS — F23 Brief psychotic disorder: Secondary | ICD-10-CM | POA: Diagnosis not present

## 2024-12-05 DIAGNOSIS — F29 Unspecified psychosis not due to a substance or known physiological condition: Secondary | ICD-10-CM | POA: Diagnosis not present

## 2024-12-05 NOTE — Group Note (Signed)
 Date:  12/05/2024 Time:  11:01 AM  Group Topic/Focus:  Orientation:   The focus of this group is to educate the patient on the purpose and policies of crisis stabilization and provide a format to answer questions about their admission.  The group details unit policies and expectations of patients while admitted. Wellness Toolbox:   The focus of this group is to discuss various aspects of wellness, balancing those aspects and exploring ways to increase the ability to experience wellness.  Patients will create a wellness toolbox for use upon discharge.    Participation Level:  Active  Participation Quality:  Appropriate and Attentive  Affect:  Appropriate  Cognitive:  Alert and Appropriate  Insight: Appropriate  Engagement in Group:  Engaged  Modes of Intervention:  Activity and Orientation  Additional Comments:     Maglione,[place] E 12/05/2024, 11:01 AM

## 2024-12-05 NOTE — Progress Notes (Signed)
 Mobility Specialist Progress Note:    12/05/24 1332  Mobility  Activity Ambulated independently;Stood with assistance  Level of Assistance Modified independent, requires aide device or extra time  Assistive Device Front wheel walker  Distance Ambulated (ft) 500 ft  Range of Motion/Exercises Active;All extremities  Activity Response Tolerated well  Mobility visit 1 Mobility  Mobility Specialist Start Time (ACUTE ONLY) 1332  Mobility Specialist Stop Time (ACUTE ONLY) 1345  Mobility Specialist Time Calculation (min) (ACUTE ONLY) 13 min   Pt received in bed, agreeable to mobility. ModI to stand and ambulate with RW. Tolerated well, asx throughout. Left pt in dayroom, all needs met.  Sherrilee Ditty Mobility Specialist Please contact via Special Educational Needs Teacher or  Rehab office at [phone]

## 2024-12-05 NOTE — Progress Notes (Addendum)
(  Sleep Hours) - 6.25 (Any PRNs that were needed, meds refused, or side effects to meds)- Trazodone -effective (Any disturbances and when (visitation, over night)-None reported or observed (Concerns raised by the patient)- None reported or observed (SI/HI/AVH)- Denies

## 2024-12-05 NOTE — Progress Notes (Signed)
 [facility] MD Progress Note  12/05/2024 3:42 PM Sarah Sullivan  MRN:  [id number] [name] is a 79 year old female with a past psychiatric history of depression and mood disturbances, presenting to the ED for worsening auditory hallucinations. She reports hearing voices telling her that people are trying to harm her, and she has been increasingly paranoid. She describes having to whisper while speaking and being hypervigilant at home, checking windows every 15 minutes.Patient is admitted to [facility] unit with Q15 min safety monitoring. Multidisciplinary team approach is offered. Medication management; group/milieu therapy is offered.  11/29/23: Collaterals:Provider and social worker reached out to patient's family-they informed the team about abrupt onset of psychosis in the last few weeks and change in personality lately.  They informed that patient was independent with no mental health history till now.  Provider and the family discussed the possibility of underlying dementia and also discussed PT OT referrals to evaluate the appropriate level of living conditions.  Subjective:  Chart reviewed, case discussed in multidisciplinary meeting, patient seen during rounds.   Patient is noted to be resting in bed.  She reports feeling better today.  She was discharged focused.  Provider updated her that her sister is requesting for patient to be discharged by Friday so that family can make arrangements to moving to stay with her to assist with her ADLs.  Patient denies auditory/visual hallucinations today and denies SI/HI/plan.  Patient reports fair appetite and sleep.  Past Psychiatric History: see h&P Family History:  Family History  Problem Relation Age of Onset   Breast cancer Neg Hx    Social History:  Social History   Substance and Sexual Activity  Alcohol Use Not Currently     Social History   Substance and Sexual Activity  Drug Use Not Currently    Social History   Socioeconomic History    Marital status: Married    Spouse name: Not on file   Number of children: Not on file   Years of education: Not on file   Highest education level: Not on file  Occupational History   Not on file  Tobacco Use   Smoking status: Never   Smokeless tobacco: Never  Substance and Sexual Activity   Alcohol use: Not Currently   Drug use: Not Currently   Sexual activity: Not on file  Other Topics Concern   Not on file  Social History Narrative   Not on file   Social Drivers of Health   Tobacco Use: Low Risk (11/25/2024)   Patient History    Smoking Tobacco Use: Never    Smokeless Tobacco Use: Never    Passive Exposure: Not on file  Financial Resource Strain: Low Risk  (10/18/2024)   Received from [facility]   Overall Financial Resource Strain (CARDIA)    Difficulty of Paying Living Expenses: Not hard at all  Food Insecurity: No Food Insecurity (11/25/2024)   Epic    Worried About Radiation Protection Practitioner of Food in the Last Year: Never true    Ran Out of Food in the Last Year: Never true  Transportation Needs: No Transportation Needs (11/25/2024)   Epic    Lack of Transportation (Medical): No    Lack of Transportation (Non-Medical): No  Physical Activity: Not on file  Stress: Not on file  Social Connections: Socially Isolated (11/25/2024)   Social Connection and Isolation Panel    Frequency of Communication with Friends and Family: Never    Frequency of Social Gatherings with Friends and  Family: Never    Attends Religious Services: Never    Active Member of Clubs or Organizations: No    Attends Banker Meetings: Never    Marital Status: Widowed  Depression (PHQ2-9): Not on file  Alcohol Screen: Low Risk (11/24/2024)   Alcohol Screen    Last Alcohol Screening Score (AUDIT): 0  Housing: Low Risk (11/25/2024)   Epic    Unable to Pay for Housing in the Last Year: No    Number of Times Moved in the Last Year: 0    Homeless in the Last Year: No  Utilities: Not At Risk  (11/25/2024)   Epic    Threatened with loss of utilities: No  Health Literacy: Not on file   Past Medical History:  Past Medical History:  Diagnosis Date   Anemia    Hypertension     Past Surgical History:  Procedure Laterality Date   ABDOMINAL HYSTERECTOMY     COLONOSCOPY N/A 04/26/2019   Procedure: COLONOSCOPY;  Surgeon: Unk Corinn Skiff, MD;  Location: [facility] ENDOSCOPY;  Service: Gastroenterology;  Laterality: N/A;   COLONOSCOPY N/A 04/27/2019   Procedure: COLONOSCOPY;  Surgeon: Unk Corinn Skiff, MD;  Location: [facility] ENDOSCOPY;  Service: Gastroenterology;  Laterality: N/A;   ESOPHAGOGASTRODUODENOSCOPY N/A 04/25/2019   Procedure: ESOPHAGOGASTRODUODENOSCOPY (EGD);  Surgeon: Unk Corinn Skiff, MD;  Location: [facility] ENDOSCOPY;  Service: Gastroenterology;  Laterality: N/A;   ESOPHAGOGASTRODUODENOSCOPY N/A 04/26/2019   Procedure: ESOPHAGOGASTRODUODENOSCOPY (EGD);  Surgeon: Unk Corinn Skiff, MD;  Location: [facility] ENDOSCOPY;  Service: Gastroenterology;  Laterality: N/A;   TEMPORARY DIALYSIS CATHETER N/A 04/21/2019   Procedure: TEMPORARY DIALYSIS CATHETER;  Surgeon: Marea Selinda RAMAN, MD;  Location: [facility] INVASIVE CV LAB;  Service: Cardiovascular;  Laterality: N/A;    Current Medications: Current Facility-Administered Medications  Medication Dose Route Frequency Provider Last Rate Last Admin   acetaminophen  (TYLENOL ) tablet 650 mg  650 mg Oral Q6H PRN McLauchlin, Angela, NP   650 mg at 11/24/24 2338   alum & mag hydroxide-simeth (MAALOX/MYLANTA) 200-200-20 MG/5ML suspension 30 mL  30 mL Oral Q4H PRN McLauchlin, Angela, NP   30 mL at 12/04/24 1436   atorvastatin  (LIPITOR ) tablet 80 mg  80 mg Oral Daily Madaram, Kondal R, MD   80 mg at 12/05/24 0956   DULoxetine  (CYMBALTA ) DR capsule 60 mg  60 mg Oral Daily Auriel Kist, MD   60 mg at 12/05/24 0956   feeding supplement (ENSURE PLUS HIGH PROTEIN) liquid 237 mL  237 mL Oral TID BM Edla Para, MD   237 mL at 12/05/24 1344   felodipine  (PLENDIL ) 24  hr tablet 10 mg  10 mg Oral Daily Madaram, Kondal R, MD   10 mg at 12/05/24 1059   gabapentin  (NEURONTIN ) capsule 300 mg  300 mg Oral BID Elesa Perkins, RPH   300 mg at 12/05/24 1343   And   gabapentin  (NEURONTIN ) capsule 600 mg  600 mg Oral QHS Elesa Perkins, RPH   600 mg at 12/04/24 2130   irbesartan  (AVAPRO ) tablet 75 mg  75 mg Oral Daily Madaram, Kondal R, MD   75 mg at 12/05/24 1059   magnesium  hydroxide (MILK OF MAGNESIA) suspension 30 mL  30 mL Oral Daily PRN McLauchlin, Angela, NP       mirtazapine  (REMERON ) tablet 15 mg  15 mg Oral QHS Madaram, Kondal R, MD   15 mg at 12/04/24 2130   multivitamin with minerals tablet 1 tablet  1 tablet Oral Daily Loel Betancur, MD   1 tablet at  12/05/24 0957   OLANZapine  zydis (ZYPREXA ) disintegrating tablet 5 mg  5 mg Oral TID PRN McLauchlin, Angela, NP   5 mg at 11/25/24 2318   pantoprazole  (PROTONIX ) EC tablet 40 mg  40 mg Oral BID AC Madaram, Kondal R, MD   40 mg at 12/05/24 0857   risperiDONE  (RISPERDAL ) tablet 3 mg  3 mg Oral BID Breven Guidroz, MD   3 mg at 12/05/24 9043   traZODone  (DESYREL ) tablet 50 mg  50 mg Oral QHS PRN McLauchlin, Angela, NP   50 mg at 12/04/24 2131    Lab Results:  No results found for this or any previous visit (from the past 48 hours).   Blood Alcohol level:  Lab Results  Component Value Date   [facility] <15 11/24/2024    Metabolic Disorder Labs: Lab Results  Component Value Date   HGBA1C 5.3 04/29/2019   MPG 105.41 04/29/2019   No results found for: PROLACTIN Lab Results  Component Value Date   CHOL 175 04/29/2019   TRIG 93 04/29/2019   HDL 68 04/29/2019   CHOLHDL 2.6 04/29/2019   VLDL 19 04/29/2019   LDLCALC 88 04/29/2019    Physical Findings: AIMS:  , ,  ,  ,    CIWA:    COWS:      Psychiatric Specialty Exam:  Presentation  General Appearance: Stated age Eye Contact:Fleeting  Speech:Slow; Slurred  Speech Volume:Decreased    Mood and Affect  Mood:  Fine Affect:Blunt   Thought Process  Thought Processes: Linear Orientation:Partial  Thought Content:Illogical; Paranoid Ideation; Delusions  Hallucinations:auditory hallucinations,  Ideas of Reference: Denies Suicidal Thoughts: Reports passive SI Homicidal Thoughts:denies   Sensorium  Memory:Immediate Fair; Remote Fair  Judgment:Impaired  Insight:Shallow   Executive Functions  Concentration:Poor  Attention Span:Fair  Recall:Fair  Fund of Knowledge:Fair  Language:Fair   Psychomotor Activity  Psychomotor Activity:No data recorded  Musculoskeletal: Strength & Muscle Tone: decreased Gait & Station: unsteady Assets  Assets:No data recorded   Physical Exam: Physical Exam Vitals and nursing note reviewed.    ROS Blood pressure (!) 144/76, pulse 84, temperature 97.7 F (36.5 C), resp. rate 17, height 4' 10 (1.473 m), weight 38.8 kg, SpO2 99%. Body mass index is 17.87 kg/m.  Diagnosis: Principal Problem:   Psychosis (HCC)   PLAN: Safety and Monitoring:  -- Voluntary admission to inpatient psychiatric unit for safety, stabilization and treatment  -- Daily contact with patient to assess and evaluate symptoms and progress in treatment  -- Patient's case to be discussed in multi-disciplinary team meeting  -- Observation Level : q15 minute checks  -- Vital signs:  q12 hours  -- Precautions: suicide, elopement, and assault -- Encouraged patient to participate in unit milieu and in scheduled group therapies  2. Psychiatric Treatment:  Scheduled Medications:    Risperdal  increased to 3 mg twice daily Duloxetine  30 mg daily-increase to 60 mg daily Remeron  15 mg nightly -- The risks/benefits/side-effects/alternatives to this medication were discussed in detail with the patient and time was given for questions. The patient consents to medication trial.  3. Medical Issues Being Addressed:   TECHNIQUE: Axial CT images of the head from skull base to vertex  without contrast.   FINDINGS:  Mild global cerebral atrophy with ex vacuo dilation of the CSF spaces. There is no midline shift. No mass lesion. There is no evidence of acute infarct. Question prior basal ganglia lacunar infarcts. The sinuses are pneumatized. Bilateral carotid siphon calcifications. Bilateral pseudophakia.   No intracranial hemorrhage  or skull fractures.   IMPRESSION:  No acute intracranial abnormality. Question prior basal ganglia lacunar infarcts. Consider MRI.   4. Discharge Planning:   -- Social work and case management to assist with discharge planning and identification of hospital follow-up needs prior to discharge  -- Estimated LOS: 3-4 days  Spurgeon Gancarz, MD 12/05/2024, 3:42 PM

## 2024-12-05 NOTE — Group Note (Signed)
 [facility] LCSW Group Therapy Note   Group Date: 12/05/2024 Start Time: 1330 End Time: 1400  Type of Therapy and Topic:  Group Therapy:  Feelings around Relapse and Recovery  Participation Level:  None   Mood:  Description of Group:    Patients in this group will discuss emotions they experience before and after a relapse. They will process how experiencing these feelings, or avoidance of experiencing them, relates to having a relapse. Facilitator will guide patients to explore emotions they have related to recovery. Patients will be encouraged to process which emotions are more powerful. They will be guided to discuss the emotional reaction significant others in their lives may have to patients relapse or recovery. Patients will be assisted in exploring ways to respond to the emotions of others without this contributing to a relapse.  Therapeutic Goals: Patient will identify two or more emotions that lead to relapse for them:  Patient will identify two emotions that result when they relapse:  Patient will identify two emotions related to recovery:  Patient will demonstrate ability to communicate their needs through discussion and/or role plays.   Summary of Patient Progress:  Pt came in to group later than the group start time. Pt was appropriate throughout group.    Therapeutic Modalities:   Cognitive Behavioral Therapy Solution-Focused Therapy Assertiveness Training Relapse Prevention Therapy   Lum JONETTA Croft, LCSWA

## 2024-12-05 NOTE — Group Note (Signed)
 Recreation Therapy Group Note   Group Topic:Leisure Education  Group Date: 12/05/2024 Start Time: 1415 End Time: 1500 Facilitators: Celestia Jeoffrey BRAVO, LRT, CTRS Location: Dayroom  Group Description: General Recreation. Patients were given the opportunity to play cards, journal, or color mandalas during group. Pt identified and conversated about things they enjoy doing in their free time and how they can continue to do that outside of the hospital.  Goal Area(s) Addressed: Patient will practice making a positive decision. Patient will have the opportunity to try a new leisure activity. Patient will communicate with peers and LRT.   Affect/Mood: Appropriate   Participation Level: Moderate   Participation Quality: Independent   Behavior: Calm and Cooperative   Speech/Thought Process: Coherent   Insight: Fair   Judgement: Fair    Modes of Intervention: Education, Exploration, Guided Discussion, and Music   Patient Response to Interventions:  Receptive   Education Outcome:  In group clarification offered    Clinical Observations/Individualized Feedback: Ayssa was mostly active in their participation of session activities and group discussion. Pt interacted well with LRT and peers duration of session.    Plan: Continue to engage patient in RT group sessions 2-3x/week.   Jeoffrey BRAVO Celestia, LRT, CTRS 12/05/2024 4:50 PM

## 2024-12-05 NOTE — Group Note (Signed)
 Date:  12/05/2024 Time:  9:17 PM  Group Topic/Focus:  Wrap-Up Group:   The focus of this group is to help patients review their daily goal of treatment and discuss progress on daily workbooks.    Participation Level:  Active  Participation Quality:  Appropriate  Affect:  Appropriate  Cognitive:  Alert  Insight: Appropriate  Engagement in Group:  Engaged  Modes of Intervention:  Discussion  Additional Comments:    Sarah Sullivan 12/05/2024, 9:17 PM

## 2024-12-05 NOTE — Plan of Care (Signed)
°  Problem: Education: Goal: Ability to state activities that reduce stress will improve Outcome: Progressing   Problem: Coping: Goal: Ability to identify and develop effective coping behavior will improve Outcome: Progressing   Problem: Self-Concept: Goal: Ability to identify factors that promote anxiety will improve Outcome: Progressing Goal: Level of anxiety will decrease Outcome: Progressing Goal: Ability to modify response to factors that promote anxiety will improve Outcome: Progressing   Problem: Education: Goal: Utilization of techniques to improve thought processes will improve Outcome: Progressing Goal: Knowledge of the prescribed therapeutic regimen will improve Outcome: Progressing

## 2024-12-06 DIAGNOSIS — F23 Brief psychotic disorder: Secondary | ICD-10-CM | POA: Diagnosis not present

## 2024-12-06 NOTE — Progress Notes (Signed)
 [facility] MD Progress Note  12/06/2024 10:11 PM Sarah Sullivan  MRN:  [id number] Sarah Sullivan is a 79 year old female with a past psychiatric history of depression and mood disturbances, presenting to the ED for worsening auditory hallucinations. She reports hearing voices telling her that people are trying to harm her, and she has been increasingly paranoid. She describes having to whisper while speaking and being hypervigilant at home, checking windows every 15 minutes.Patient is admitted to [facility] unit with Q15 min safety monitoring. Multidisciplinary team approach is offered. Medication management; group/milieu therapy is offered.  11/29/23: Collaterals:Provider and social worker reached out to patient's family-they informed the team about abrupt onset of psychosis in the last few weeks and change in personality lately.  They informed that patient was independent with no mental health history till now.  Provider and the family discussed the possibility of underlying dementia and also discussed PT OT referrals to evaluate the appropriate level of living conditions.  Subjective:  Chart reviewed, case discussed in multidisciplinary meeting, patient seen during rounds.   Patient is noted to be resting in bed.  She reports doing well.  She reports having some cough especially when laying down.  She continues to express her frustration regarding her female peer who is being targeting her on the unit.  She was very excited to learn about her discharge planning on Friday.  She acknowledged that her family will be staying with her to help her out.  She continues to deny auditory/visual has patient send denies SI/HI/plan.  She reports fair appetite and sleep Past Psychiatric History: see h&P Family History:  Family History  Problem Relation Age of Onset   Breast cancer Neg Hx    Social History:  Social History   Substance and Sexual Activity  Alcohol Use Not Currently     Social History   Substance and  Sexual Activity  Drug Use Not Currently    Social History   Socioeconomic History   Marital status: Married    Spouse name: Not on file   Number of children: Not on file   Years of education: Not on file   Highest education level: Not on file  Occupational History   Not on file  Tobacco Use   Smoking status: Never   Smokeless tobacco: Never  Substance and Sexual Activity   Alcohol use: Not Currently   Drug use: Not Currently   Sexual activity: Not on file  Other Topics Concern   Not on file  Social History Narrative   Not on file   Social Drivers of Health   Tobacco Use: Low Risk (11/25/2024)   Patient History    Smoking Tobacco Use: Never    Smokeless Tobacco Use: Never    Passive Exposure: Not on file  Financial Resource Strain: Low Risk  (10/18/2024)   Received from [facility]   Overall Financial Resource Strain (CARDIA)    Difficulty of Paying Living Expenses: Not hard at all  Food Insecurity: No Food Insecurity (11/25/2024)   Epic    Worried About Radiation Protection Practitioner of Food in the Last Year: Never true    Ran Out of Food in the Last Year: Never true  Transportation Needs: No Transportation Needs (11/25/2024)   Epic    Lack of Transportation (Medical): No    Lack of Transportation (Non-Medical): No  Physical Activity: Not on file  Stress: Not on file  Social Connections: Socially Isolated (11/25/2024)   Social Connection and Isolation Panel  Frequency of Communication with Friends and Family: Never    Frequency of Social Gatherings with Friends and Family: Never    Attends Religious Services: Never    Database Administrator or Organizations: No    Attends Banker Meetings: Never    Marital Status: Widowed  Depression (PHQ2-9): Not on file  Alcohol Screen: Low Risk (11/24/2024)   Alcohol Screen    Last Alcohol Screening Score (AUDIT): 0  Housing: Low Risk (11/25/2024)   Epic    Unable to Pay for Housing in the Last Year: No    Number of  Times Moved in the Last Year: 0    Homeless in the Last Year: No  Utilities: Not At Risk (11/25/2024)   Epic    Threatened with loss of utilities: No  Health Literacy: Not on file   Past Medical History:  Past Medical History:  Diagnosis Date   Anemia    Hypertension     Past Surgical History:  Procedure Laterality Date   ABDOMINAL HYSTERECTOMY     COLONOSCOPY N/A 04/26/2019   Procedure: COLONOSCOPY;  Surgeon: Unk Corinn Skiff, MD;  Location: [facility] ENDOSCOPY;  Service: Gastroenterology;  Laterality: N/A;   COLONOSCOPY N/A 04/27/2019   Procedure: COLONOSCOPY;  Surgeon: Unk Corinn Skiff, MD;  Location: [facility] ENDOSCOPY;  Service: Gastroenterology;  Laterality: N/A;   ESOPHAGOGASTRODUODENOSCOPY N/A 04/25/2019   Procedure: ESOPHAGOGASTRODUODENOSCOPY (EGD);  Surgeon: Unk Corinn Skiff, MD;  Location: [facility] ENDOSCOPY;  Service: Gastroenterology;  Laterality: N/A;   ESOPHAGOGASTRODUODENOSCOPY N/A 04/26/2019   Procedure: ESOPHAGOGASTRODUODENOSCOPY (EGD);  Surgeon: Unk Corinn Skiff, MD;  Location: [facility] ENDOSCOPY;  Service: Gastroenterology;  Laterality: N/A;   TEMPORARY DIALYSIS CATHETER N/A 04/21/2019   Procedure: TEMPORARY DIALYSIS CATHETER;  Surgeon: Marea Selinda RAMAN, MD;  Location: [facility] INVASIVE CV LAB;  Service: Cardiovascular;  Laterality: N/A;    Current Medications: Current Facility-Administered Medications  Medication Dose Route Frequency Provider Last Rate Last Admin   acetaminophen  (TYLENOL ) tablet 650 mg  650 mg Oral Q6H PRN McLauchlin, Angela, NP   650 mg at 11/24/24 2338   alum & mag hydroxide-simeth (MAALOX/MYLANTA) 200-200-20 MG/5ML suspension 30 mL  30 mL Oral Q4H PRN McLauchlin, Angela, NP   30 mL at 12/04/24 1436   atorvastatin  (LIPITOR ) tablet 80 mg  80 mg Oral Daily Madaram, Kondal R, MD   80 mg at 12/06/24 0913   DULoxetine  (CYMBALTA ) DR capsule 60 mg  60 mg Oral Daily Ming Mcmannis, MD   60 mg at 12/06/24 0912   feeding supplement (ENSURE PLUS HIGH PROTEIN) liquid 237 mL   237 mL Oral TID BM Donnah Levert, MD   Given at 12/06/24 2126   felodipine  (PLENDIL ) 24 hr tablet 10 mg  10 mg Oral Daily Madaram, Kondal R, MD   10 mg at 12/06/24 0913   gabapentin  (NEURONTIN ) capsule 300 mg  300 mg Oral BID Elesa Perkins, RPH   300 mg at 12/06/24 1520   And   gabapentin  (NEURONTIN ) capsule 600 mg  600 mg Oral QHS Elesa Perkins, RPH   600 mg at 12/06/24 2125   irbesartan  (AVAPRO ) tablet 75 mg  75 mg Oral Daily Madaram, Kondal R, MD   75 mg at 12/06/24 0913   magnesium  hydroxide (MILK OF MAGNESIA) suspension 30 mL  30 mL Oral Daily PRN McLauchlin, Angela, NP       mirtazapine  (REMERON ) tablet 15 mg  15 mg Oral QHS Madaram, Kondal R, MD   15 mg at 12/06/24 2125   multivitamin with  minerals tablet 1 tablet  1 tablet Oral Daily Darian Ace, MD   1 tablet at 12/06/24 0913   OLANZapine  zydis (ZYPREXA ) disintegrating tablet 5 mg  5 mg Oral TID PRN McLauchlin, Angela, NP   5 mg at 11/25/24 2318   pantoprazole  (PROTONIX ) EC tablet 40 mg  40 mg Oral BID AC Madaram, Kondal R, MD   40 mg at 12/06/24 1659   risperiDONE  (RISPERDAL ) tablet 3 mg  3 mg Oral BID Trinady Milewski, MD   3 mg at 12/06/24 2125   traZODone  (DESYREL ) tablet 50 mg  50 mg Oral QHS PRN McLauchlin, Angela, NP   50 mg at 12/06/24 2125    Lab Results:  No results found for this or any previous visit (from the past 48 hours).   Blood Alcohol level:  Lab Results  Component Value Date   [facility] <15 11/24/2024    Metabolic Disorder Labs: Lab Results  Component Value Date   HGBA1C 5.3 04/29/2019   MPG 105.41 04/29/2019   No results found for: PROLACTIN Lab Results  Component Value Date   CHOL 175 04/29/2019   TRIG 93 04/29/2019   HDL 68 04/29/2019   CHOLHDL 2.6 04/29/2019   VLDL 19 04/29/2019   LDLCALC 88 04/29/2019    Physical Findings: AIMS:  , ,  ,  ,    CIWA:    COWS:      Psychiatric Specialty Exam:  Presentation  General Appearance: Stated age Eye Contact:Fleeting  Speech:Slow;  Slurred  Speech Volume:Decreased    Mood and Affect  Mood: Fine Affect:Blunt   Thought Process  Thought Processes: Linear Orientation:Partial  Thought Content:Illogical; Paranoid Ideation; Delusions  Hallucinations:auditory hallucinations,  Ideas of Reference: Denies Suicidal Thoughts: Reports passive SI Homicidal Thoughts:denies   Sensorium  Memory:Immediate Fair; Remote Fair  Judgment:Impaired  Insight:Shallow   Executive Functions  Concentration:Poor  Attention Span:Fair  Recall:Fair  Fund of Knowledge:Fair  Language:Fair   Psychomotor Activity  Psychomotor Activity:No data recorded  Musculoskeletal: Strength & Muscle Tone: decreased Gait & Station: unsteady Assets  Assets:No data recorded   Physical Exam: Physical Exam Vitals and nursing note reviewed.    ROS Blood pressure 129/62, pulse 92, temperature 97.7 F (36.5 C), temperature source Oral, resp. rate 17, height 4' 10 (1.473 m), weight 38.8 kg, SpO2 100%. Body mass index is 17.87 kg/m.  Diagnosis: Active Problems:   Acute psychosis (HCC)   PLAN: Safety and Monitoring:  -- Voluntary admission to inpatient psychiatric unit for safety, stabilization and treatment  -- Daily contact with patient to assess and evaluate symptoms and progress in treatment  -- Patient's case to be discussed in multi-disciplinary team meeting  -- Observation Level : q15 minute checks  -- Vital signs:  q12 hours  -- Precautions: suicide, elopement, and assault -- Encouraged patient to participate in unit milieu and in scheduled group therapies  2. Psychiatric Treatment:  Scheduled Medications:    Risperdal  increased to 3 mg twice daily Duloxetine  30 mg daily-increase to 60 mg daily Remeron  15 mg nightly -- The risks/benefits/side-effects/alternatives to this medication were discussed in detail with the patient and time was given for questions. The patient consents to medication trial.  3. Medical  Issues Being Addressed:   TECHNIQUE: Axial CT images of the head from skull base to vertex without contrast.   FINDINGS:  Mild global cerebral atrophy with ex vacuo dilation of the CSF spaces. There is no midline shift. No mass lesion. There is no evidence of acute infarct. Question  prior basal ganglia lacunar infarcts. The sinuses are pneumatized. Bilateral carotid siphon calcifications. Bilateral pseudophakia.   No intracranial hemorrhage or skull fractures.   IMPRESSION:  No acute intracranial abnormality. Question prior basal ganglia lacunar infarcts. Consider MRI.   4. Discharge Planning:   -- Social work and case management to assist with discharge planning and identification of hospital follow-up needs prior to discharge  -- Estimated LOS: 3-4 days  Allyn Foil, MD 12/06/2024, 10:11 PM

## 2024-12-06 NOTE — Progress Notes (Signed)
 SI/HI/AVH: denies all  Behavior/Mood: cooperative/pleasant    Interaction/Group attendance: assertive/ 2 of 3 groups   Medication/PRNs: compliant/none   Pain: denies  Other: patient worked with mobility and physical therapy today.   Expected discharge Friday 01.16.

## 2024-12-06 NOTE — [facility] Group Notes (Signed)
 Spirituality Group   Description: Participant directed exploration of values, beliefs and meaning   **Focus on Gratitude: Invite reflection on sources of gratitude (external/internal); goal to invite internal gratitude to foster 1) reconnection with life-giving activities 2) self-compassion.   Following a brief framework of chaplains role and ground rules of group behavior, participants are invited to share concerns or questions that engage spiritual life. Emphasis placed on common themes and shared experiences and ways to make meaning and clarify living into ones values.   Theory/Process/Goal: Utilize the theoretical framework of group therapy established by Celena Kite, Relational Cultural Theory and Rogerian approaches to facilitate relational empathy and use of the here and now to foster reflection, self-awareness, and sharing.   Observations: Sarah Sullivan was present for 0.5 of group. She shared loss of five members of family and this seems an area for ongoing support.  [name] L. [name] HERO.Div

## 2024-12-06 NOTE — Plan of Care (Signed)
" °  Problem: Education: Goal: Ability to state activities that reduce stress will improve Outcome: Progressing   Problem: Coping: Goal: Ability to identify and develop effective coping behavior will improve Outcome: Progressing   Problem: Self-Concept: Goal: Ability to identify factors that promote anxiety will improve Outcome: Progressing Goal: Level of anxiety will decrease Outcome: Progressing Goal: Ability to modify response to factors that promote anxiety will improve Outcome: Progressing   Problem: Education: Goal: Utilization of techniques to improve thought processes will improve Outcome: Progressing Goal: Knowledge of the prescribed therapeutic regimen will improve Outcome: Progressing   Problem: Activity: Goal: Interest or engagement in leisure activities will improve Outcome: Progressing Goal: Imbalance in normal sleep/wake cycle will improve Outcome: Progressing   Problem: Coping: Goal: Coping ability will improve Outcome: Progressing Goal: Will verbalize feelings Outcome: Progressing   Problem: Health Behavior/Discharge Planning: Goal: Ability to make decisions will improve Outcome: Progressing Goal: Compliance with therapeutic regimen will improve Outcome: Progressing   Problem: Role Relationship: Goal: Will demonstrate positive changes in social behaviors and relationships Outcome: Progressing   Problem: Safety: Goal: Ability to disclose and discuss suicidal ideas will improve Outcome: Progressing Goal: Ability to identify and utilize support systems that promote safety will improve Outcome: Progressing   Problem: Self-Concept: Goal: Will verbalize positive feelings about self Outcome: Progressing Goal: Level of anxiety will decrease Outcome: Progressing   Problem: Education: Goal: Knowledge of [place] Education information/materials will improve Outcome: Progressing Goal: Emotional status will improve Outcome: Progressing Goal:  Mental status will improve Outcome: Progressing Goal: Verbalization of understanding the information provided will improve Outcome: Progressing   Problem: Activity: Goal: Interest or engagement in activities will improve Outcome: Progressing Goal: Sleeping patterns will improve Outcome: Progressing   Problem: Coping: Goal: Ability to verbalize frustrations and anger appropriately will improve Outcome: Progressing Goal: Ability to demonstrate self-control will improve Outcome: Progressing   Problem: Health Behavior/Discharge Planning: Goal: Identification of resources available to assist in meeting health care needs will improve Outcome: Progressing Goal: Compliance with treatment plan for underlying cause of condition will improve Outcome: Progressing   Problem: Physical Regulation: Goal: Ability to maintain clinical measurements within normal limits will improve Outcome: Progressing   Problem: Safety: Goal: Periods of time without injury will increase Outcome: Progressing   Problem: Activity: Goal: Will verbalize the importance of balancing activity with adequate rest periods Outcome: Progressing   Problem: Education: Goal: Will be free of psychotic symptoms Outcome: Progressing Goal: Knowledge of the prescribed therapeutic regimen will improve Outcome: Progressing   Problem: Coping: Goal: Coping ability will improve Outcome: Progressing Goal: Will verbalize feelings Outcome: Progressing   Problem: Health Behavior/Discharge Planning: Goal: Compliance with prescribed medication regimen will improve Outcome: Progressing   Problem: Nutritional: Goal: Ability to achieve adequate nutritional intake will improve Outcome: Progressing   Problem: Role Relationship: Goal: Ability to communicate needs accurately will improve Outcome: Progressing Goal: Ability to interact with others will improve Outcome: Progressing   Problem: Safety: Goal: Ability to redirect  hostility and anger into socially appropriate behaviors will improve Outcome: Progressing Goal: Ability to remain free from injury will improve Outcome: Progressing   Problem: Self-Care: Goal: Ability to participate in self-care as condition permits will improve Outcome: Progressing   Problem: Self-Concept: Goal: Will verbalize positive feelings about self Outcome: Progressing   "

## 2024-12-06 NOTE — Group Note (Signed)
 Date:  12/06/2024 Time:  10:21 AM  Group Topic/Focus:  Orientation:   The focus of this group is to educate the patient on the purpose and policies of crisis stabilization and provide a format to answer questions about their admission.  The group details unit policies and expectations of patients while admitted.  Effective geriatric exercises focus on aerobic activity, strength, balance, and flexibility, including brisk walking, swimming, chair yoga, tai chi, using resistance bands, and simple bodyweight moves (like chair squats, wall push-ups) to boost heart health, prevent falls, maintain independence, and improve overall well-being, aiming for 150 mins of moderate aerobic activity and 2+ days of strength training weekly.   Participation Level:  Did Not Attend   Harlene LITTIE Gavel 12/06/2024, 10:21 AM

## 2024-12-06 NOTE — Progress Notes (Signed)
 Physical Therapy Discharge Patient Details Name: Sarah Sullivan MRN: [id number] DOB: September 03, 1946 Today's Date: 12/06/2024 Time:  -     Patient discharged from PT services secondary to pt at baseline level of function, does not feel she needs continued PT.  Please see latest therapy progress note for current level of functioning and progress toward goals.    Progress and discharge plan discussed with patient and/or caregiver: Patient/Caregiver agrees with plan  On arrival pt was standing in the day room talking with staff and other patients.  She saw me and walked over, with her walker, and she reports she has been doing well and feeling strong (walked ~500 ft with mobility tech earlier today).  I asked if she though she was back to baseline and didn't need any further formal PT, to which she responded by showing me a tattoo with a cross and said she does not need any more PT because she had Jesus helping her.  Spoke with staff who say that she has been moving well and that is seems appropriate to complete the PT orders at this time.  If new mobility/functional issues arise, and pt needs PT, we will need new orders.  Signing off at this time.     Carmin JONELLE Deed, DPT 12/06/2024, 4:18 PM

## 2024-12-06 NOTE — Progress Notes (Signed)
" °   12/05/24 2000  Psych Admission Type (Psych Patients Only)  Admission Status Voluntary  Psychosocial Assessment  Patient Complaints None  Eye Contact Fair  Facial Expression Animated  Affect Appropriate to circumstance  Speech Soft  Interaction Assertive  Motor Activity Slow  Appearance/Hygiene In scrubs  Behavior Characteristics Cooperative;Appropriate to situation;Calm  Mood Pleasant  Thought Process  Coherency Circumstantial  Content WDL  Delusions None reported or observed  Perception WDL  Hallucination None reported or observed  Judgment Impaired  Confusion Mild  Danger to Self  Current suicidal ideation? Denies  Danger to Others  Danger to Others None reported or observed   Mood/Behavior:  Pleasant and cooperative.    Psych assessment: Denies SI/HI and AVH.     Interaction / Group attendance:  Present in the milieu. Minimal interaction with peers and staff.  Attended group.   Medication/ PRNs: Compliant with scheduled medications. Required PRNs Trazodone  for sleep and noted effective.   Pain: Denies  15 min checks in place for safety. "

## 2024-12-06 NOTE — Group Note (Signed)
 Date:  12/06/2024 Time:  3:57 PM  Group Topic/Focus:  Goals Group:   The focus of this group is to help patients establish daily goals to achieve during treatment and discuss how the patient can incorporate goal setting into their daily lives to aide in recovery.    Participation Level:  Active  Participation Quality:  Appropriate  Affect:  Appropriate  Cognitive:  Appropriate  Insight: Appropriate  Engagement in Group:  Engaged  Modes of Intervention:  Discussion  Sarah Sullivan 12/06/2024, 3:57 PM

## 2024-12-06 NOTE — Group Note (Signed)
 Date:  12/06/2024 Time:  9:41 PM  Group Topic/Focus:  Wrap-Up Group:   The focus of this group is to help patients review their daily goal of treatment and discuss progress on daily workbooks.    Participation Level:  Active  Participation Quality:  Appropriate  Affect:  Appropriate  Cognitive:  Alert  Insight: Appropriate  Engagement in Group:  Engaged  Modes of Intervention:  Discussion  Additional Comments:    Sarah Sullivan C Orman Matsumura 12/06/2024, 9:41 PM

## 2024-12-06 NOTE — Plan of Care (Signed)
  Problem: Activity: Goal: Interest or engagement in leisure activities will improve Outcome: Progressing   Problem: Activity: Goal: Imbalance in normal sleep/wake cycle will improve Outcome: Not Progressing

## 2024-12-06 NOTE — Progress Notes (Signed)
 Mobility Specialist Progress Note:    12/06/24 1349  Mobility  Activity Ambulated with assistance  Level of Assistance Modified independent, requires aide device or extra time  Assistive Device Front wheel walker  Distance Ambulated (ft) 500 ft  Range of Motion/Exercises Active;All extremities  Activity Response Tolerated well  Mobility visit 1 Mobility  Mobility Specialist Start Time (ACUTE ONLY) 1323  Mobility Specialist Stop Time (ACUTE ONLY) 1345  Mobility Specialist Time Calculation (min) (ACUTE ONLY) 22 min   Pt received in bathroom, pleasant and agreeable to mobility. Modi to stand and ambulate with RW. Tolerated well, asx throughout. Left pt in dayroom, all needs met.  Sherrilee Ditty Mobility Specialist Please contact via Special Educational Needs Teacher or  Rehab office at [phone]

## 2024-12-07 DIAGNOSIS — F23 Brief psychotic disorder: Secondary | ICD-10-CM | POA: Diagnosis not present

## 2024-12-07 NOTE — Group Note (Signed)
 Date:  12/07/2024 Time:  10:21 AM  Group Topic/Focus:  Orientation:   The focus of this group is to educate the patient on the purpose and policies of crisis stabilization and provide a format to answer questions about their admission.  The group details unit policies and expectations of patients while admitted.  Exercise for geriatric patients offers vast benefits, significantly improving physical function, mental health, and independence by reducing fall risk, managing chronic diseases (heart disease, diabetes, cancer), enhancing cognitive function, boosting mood, improving sleep, strengthening bones, and supporting longer, healthier lives. Regular activity helps combat age-related decline, preventing frailty and enabling older adults to live more independently and with better overall quality of life.   Participation Level:  Did Not Attend   Harlene LITTIE Gavel 12/07/2024, 10:21 AM

## 2024-12-07 NOTE — Progress Notes (Signed)
 Patient talkative and pleasant during interaction. Clearer in conversation that in previous interactions with this nurse. Denies SI/HI/AVH. Accepted medications without issue. Observed in the dayroom conversing with peers and participating in group. Ongoing monitoring continues.  12/07/24 1100  Psych Admission Type (Psych Patients Only)  Admission Status Voluntary  Psychosocial Assessment  Patient Complaints None  Eye Contact Fair  Facial Expression Animated  Affect Appropriate to circumstance  Speech Soft  Interaction Assertive  Motor Activity Slow  Appearance/Hygiene In scrubs  Behavior Characteristics Cooperative  Mood Pleasant  Thought Process  Coherency Circumstantial  Content WDL  Delusions None reported or observed  Perception WDL  Hallucination None reported or observed  Judgment Impaired  Confusion Mild  Danger to Self  Current suicidal ideation? Denies  Danger to Others  Danger to Others None reported or observed

## 2024-12-07 NOTE — Progress Notes (Signed)
 [facility] MD Progress Note  12/07/2024 12:14 PM Sarah Sullivan  MRN:  [id number] Sarah Sullivan is a 79 year old female with a past psychiatric history of depression and mood disturbances, presenting to the ED for worsening auditory hallucinations. She reports hearing voices telling her that people are trying to harm her, and she has been increasingly paranoid. She describes having to whisper while speaking and being hypervigilant at home, checking windows every 15 minutes.Patient is admitted to [facility] unit with Q15 min safety monitoring. Multidisciplinary team approach is offered. Medication management; group/milieu therapy is offered.  11/29/23: Collaterals:Provider and social worker reached out to patient's family-they informed the team about abrupt onset of psychosis in the last few weeks and change in personality lately.  They informed that patient was independent with no mental health history till now.  Provider and the family discussed the possibility of underlying dementia and also discussed PT OT referrals to evaluate the appropriate level of living conditions.  Subjective:  Chart reviewed, case discussed in multidisciplinary meeting, patient seen during rounds.  Patient is noted to be resting in bed.  She remains discharge focused.  She is excited about her discharge tomorrow to her family members.  Patient denies any auditory hallucinations today and denies SI/HI/plan.  She is taking her medications with no reported side effects.  She reports feeling tired during the daytime.  Patient is aware that she needs supervision and monitoring with her medications after discharge.  She reports that her family will help her with the medications  Past Psychiatric History: see h&P Family History:  Family History  Problem Relation Age of Onset   Breast cancer Neg Hx    Social History:  Social History   Substance and Sexual Activity  Alcohol Use Not Currently     Social History   Substance and Sexual  Activity  Drug Use Not Currently    Social History   Socioeconomic History   Marital status: Married    Spouse name: Not on file   Number of children: Not on file   Years of education: Not on file   Highest education level: Not on file  Occupational History   Not on file  Tobacco Use   Smoking status: Never   Smokeless tobacco: Never  Substance and Sexual Activity   Alcohol use: Not Currently   Drug use: Not Currently   Sexual activity: Not on file  Other Topics Concern   Not on file  Social History Narrative   Not on file   Social Drivers of Health   Tobacco Use: Low Risk (11/25/2024)   Patient History    Smoking Tobacco Use: Never    Smokeless Tobacco Use: Never    Passive Exposure: Not on file  Financial Resource Strain: Low Risk  (10/18/2024)   Received from [facility]   Overall Financial Resource Strain (CARDIA)    Difficulty of Paying Living Expenses: Not hard at all  Food Insecurity: No Food Insecurity (11/25/2024)   Epic    Worried About Radiation Protection Practitioner of Food in the Last Year: Never true    Ran Out of Food in the Last Year: Never true  Transportation Needs: No Transportation Needs (11/25/2024)   Epic    Lack of Transportation (Medical): No    Lack of Transportation (Non-Medical): No  Physical Activity: Not on file  Stress: Not on file  Social Connections: Socially Isolated (11/25/2024)   Social Connection and Isolation Panel    Frequency of Communication with Friends and  Family: Never    Frequency of Social Gatherings with Friends and Family: Never    Attends Religious Services: Never    Database Administrator or Organizations: No    Attends Banker Meetings: Never    Marital Status: Widowed  Depression (PHQ2-9): Not on file  Alcohol Screen: Low Risk (11/24/2024)   Alcohol Screen    Last Alcohol Screening Score (AUDIT): 0  Housing: Low Risk (11/25/2024)   Epic    Unable to Pay for Housing in the Last Year: No    Number of Times  Moved in the Last Year: 0    Homeless in the Last Year: No  Utilities: Not At Risk (11/25/2024)   Epic    Threatened with loss of utilities: No  Health Literacy: Not on file   Past Medical History:  Past Medical History:  Diagnosis Date   Anemia    Hypertension     Past Surgical History:  Procedure Laterality Date   ABDOMINAL HYSTERECTOMY     COLONOSCOPY N/A 04/26/2019   Procedure: COLONOSCOPY;  Surgeon: Unk Corinn Skiff, MD;  Location: [facility] ENDOSCOPY;  Service: Gastroenterology;  Laterality: N/A;   COLONOSCOPY N/A 04/27/2019   Procedure: COLONOSCOPY;  Surgeon: Unk Corinn Skiff, MD;  Location: [facility] ENDOSCOPY;  Service: Gastroenterology;  Laterality: N/A;   ESOPHAGOGASTRODUODENOSCOPY N/A 04/25/2019   Procedure: ESOPHAGOGASTRODUODENOSCOPY (EGD);  Surgeon: Unk Corinn Skiff, MD;  Location: [facility] ENDOSCOPY;  Service: Gastroenterology;  Laterality: N/A;   ESOPHAGOGASTRODUODENOSCOPY N/A 04/26/2019   Procedure: ESOPHAGOGASTRODUODENOSCOPY (EGD);  Surgeon: Unk Corinn Skiff, MD;  Location: [facility] ENDOSCOPY;  Service: Gastroenterology;  Laterality: N/A;   TEMPORARY DIALYSIS CATHETER N/A 04/21/2019   Procedure: TEMPORARY DIALYSIS CATHETER;  Surgeon: Marea Selinda RAMAN, MD;  Location: [facility] INVASIVE CV LAB;  Service: Cardiovascular;  Laterality: N/A;    Current Medications: Current Facility-Administered Medications  Medication Dose Route Frequency Provider Last Rate Last Admin   acetaminophen  (TYLENOL ) tablet 650 mg  650 mg Oral Q6H PRN McLauchlin, Angela, NP   650 mg at 11/24/24 2338   alum & mag hydroxide-simeth (MAALOX/MYLANTA) 200-200-20 MG/5ML suspension 30 mL  30 mL Oral Q4H PRN McLauchlin, Angela, NP   30 mL at 12/04/24 1436   atorvastatin  (LIPITOR ) tablet 80 mg  80 mg Oral Daily Madaram, Kondal R, MD   80 mg at 12/07/24 0923   DULoxetine  (CYMBALTA ) DR capsule 60 mg  60 mg Oral Daily Quantisha Marsicano, MD   60 mg at 12/07/24 9076   feeding supplement (ENSURE PLUS HIGH PROTEIN) liquid 237 mL  237 mL  Oral TID BM Nare Gaspari, MD   237 mL at 12/07/24 1014   felodipine  (PLENDIL ) 24 hr tablet 10 mg  10 mg Oral Daily Madaram, Kondal R, MD   10 mg at 12/07/24 9076   gabapentin  (NEURONTIN ) capsule 300 mg  300 mg Oral BID Elesa Perkins, RPH   300 mg at 12/07/24 9240   And   gabapentin  (NEURONTIN ) capsule 600 mg  600 mg Oral QHS Elesa Perkins, RPH   600 mg at 12/06/24 2125   irbesartan  (AVAPRO ) tablet 75 mg  75 mg Oral Daily Madaram, Kondal R, MD   75 mg at 12/07/24 9076   magnesium  hydroxide (MILK OF MAGNESIA) suspension 30 mL  30 mL Oral Daily PRN McLauchlin, Angela, NP       mirtazapine  (REMERON ) tablet 15 mg  15 mg Oral QHS Madaram, Kondal R, MD   15 mg at 12/06/24 2125   multivitamin with minerals tablet 1 tablet  1 tablet Oral Daily Clyda Smyth, MD   1 tablet at 12/07/24 9076   OLANZapine  zydis (ZYPREXA ) disintegrating tablet 5 mg  5 mg Oral TID PRN McLauchlin, Angela, NP   5 mg at 11/25/24 2318   pantoprazole  (PROTONIX ) EC tablet 40 mg  40 mg Oral BID AC Madaram, Kondal R, MD   40 mg at 12/07/24 0759   risperiDONE  (RISPERDAL ) tablet 3 mg  3 mg Oral BID Oaklen Thiam, MD   3 mg at 12/07/24 9076   traZODone  (DESYREL ) tablet 50 mg  50 mg Oral QHS PRN McLauchlin, Angela, NP   50 mg at 12/06/24 2125    Lab Results:  No results found for this or any previous visit (from the past 48 hours).   Blood Alcohol level:  Lab Results  Component Value Date   [facility] <15 11/24/2024    Metabolic Disorder Labs: Lab Results  Component Value Date   HGBA1C 5.3 04/29/2019   MPG 105.41 04/29/2019   No results found for: PROLACTIN Lab Results  Component Value Date   CHOL 175 04/29/2019   TRIG 93 04/29/2019   HDL 68 04/29/2019   CHOLHDL 2.6 04/29/2019   VLDL 19 04/29/2019   LDLCALC 88 04/29/2019    Physical Findings: AIMS:  , ,  ,  ,    CIWA:    COWS:      Psychiatric Specialty Exam:  Presentation  General Appearance: Stated age Eye Contact:Fleeting  Speech:Slow;  Slurred  Speech Volume:Decreased    Mood and Affect  Mood: Fine Affect:Blunt   Thought Process  Thought Processes: Linear Orientation:Partial  Thought Content:Illogical; Paranoid Ideation; Delusions  Hallucinations:auditory hallucinations,  Ideas of Reference: Denies Suicidal Thoughts: Reports passive SI Homicidal Thoughts:denies   Sensorium  Memory:Immediate Fair; Remote Fair  Judgment:Impaired  Insight:Shallow   Executive Functions  Concentration:Poor  Attention Span:Fair  Recall:Fair  Fund of Knowledge:Fair  Language:Fair   Psychomotor Activity  Psychomotor Activity:No data recorded  Musculoskeletal: Strength & Muscle Tone: decreased Gait & Station: unsteady Assets  Assets:No data recorded   Physical Exam: Physical Exam Vitals and nursing note reviewed.    ROS Blood pressure (!) 154/66, pulse 84, temperature 98.4 F (36.9 C), resp. rate 16, height 4' 10 (1.473 m), weight 38.8 kg, SpO2 97%. Body mass index is 17.87 kg/m.  Diagnosis: Active Problems:   Acute psychosis (HCC)   PLAN: Safety and Monitoring:  -- Voluntary admission to inpatient psychiatric unit for safety, stabilization and treatment  -- Daily contact with patient to assess and evaluate symptoms and progress in treatment  -- Patient's case to be discussed in multi-disciplinary team meeting  -- Observation Level : q15 minute checks  -- Vital signs:  q12 hours  -- Precautions: suicide, elopement, and assault -- Encouraged patient to participate in unit milieu and in scheduled group therapies  2. Psychiatric Treatment:  Scheduled Medications:    Risperdal   3 mg twice daily Duloxetine   60 mg daily Remeron  15 mg nightly -- The risks/benefits/side-effects/alternatives to this medication were discussed in detail with the patient and time was given for questions. The patient consents to medication trial.  3. Medical Issues Being Addressed:   TECHNIQUE: Axial CT images of the  head from skull base to vertex without contrast.   FINDINGS:  Mild global cerebral atrophy with ex vacuo dilation of the CSF spaces. There is no midline shift. No mass lesion. There is no evidence of acute infarct. Question prior basal ganglia lacunar infarcts. The sinuses are pneumatized. Bilateral carotid  siphon calcifications. Bilateral pseudophakia.   No intracranial hemorrhage or skull fractures.   IMPRESSION:  No acute intracranial abnormality. Question prior basal ganglia lacunar infarcts. Consider MRI.   4. Discharge Planning:   -- Social work and case management to assist with discharge planning and identification of hospital follow-up needs prior to discharge  -- Estimated LOS: 3-4 days  Allyn Foil, MD 12/07/2024, 12:14 PM

## 2024-12-07 NOTE — Plan of Care (Signed)
" °  Problem: Education: Goal: Ability to state activities that reduce stress will improve 12/07/2024 1712 by Rolena Nicanor RODES, RN Outcome: Progressing 12/07/2024 1711 by Rolena Nicanor RODES, RN Outcome: Progressing   Problem: Coping: Goal: Ability to identify and develop effective coping behavior will improve 12/07/2024 1712 by Rolena Nicanor RODES, RN Outcome: Progressing 12/07/2024 1711 by Rolena Nicanor RODES, RN Outcome: Progressing   Problem: Self-Concept: Goal: Ability to identify factors that promote anxiety will improve Outcome: Progressing Goal: Level of anxiety will decrease 12/07/2024 1712 by Rolena Nicanor RODES, RN Outcome: Progressing 12/07/2024 1711 by Rolena Nicanor RODES, RN Outcome: Progressing   Problem: Education: Goal: Utilization of techniques to improve thought processes will improve Outcome: Progressing   Problem: Coping: Goal: Coping ability will improve Outcome: Progressing Goal: Will verbalize feelings Outcome: Progressing   "

## 2024-12-07 NOTE — Group Note (Signed)
 Recreation Therapy Group Note   Group Topic:General Recreation  Group Date: 12/07/2024 Start Time: 1400 End Time: 1450 Facilitators: Celestia Jeoffrey BRAVO, LRT, CTRS Location: Dayroom  Group Description: Bingo. Patients played multiple rounds of bingo. LRT and pts discussed the definition of leisure, things they do in their free time outside of the hospital, and how bingo is also a leisure activity. Pts received a coloring book, word search book, or journal as a prize.    Goal Area(s) Addressed:  Patient will identify a current leisure interest.  Patient will learn the definition of leisure. Patient will have the opportunity to try a new leisure activity. Patient will communicate with peers and LRT.   Affect/Mood: Appropriate   Participation Level: Minimal    Clinical Observations/Individualized Feedback: Sarah Sullivan came late to group. Pt was present for less than half the session.   Plan: Continue to engage patient in RT group sessions 2-3x/week.   Jeoffrey BRAVO Celestia, LRT, CTRS 12/07/2024 4:34 PM

## 2024-12-07 NOTE — Progress Notes (Signed)
" °   12/07/24 0035  Psych Admission Type (Psych Patients Only)  Admission Status Voluntary  Psychosocial Assessment  Patient Complaints None  Eye Contact Fair  Facial Expression Animated  Affect Appropriate to circumstance  Speech Soft  Interaction Assertive  Motor Activity Slow  Appearance/Hygiene In scrubs  Behavior Characteristics Cooperative  Mood Pleasant  Aggressive Behavior  Effect No apparent injury  Thought Process  Coherency Circumstantial  Content WDL  Delusions None reported or observed  Perception WDL  Hallucination None reported or observed  Judgment Impaired  Confusion Mild  Danger to Self  Current suicidal ideation? Denies  Danger to Others  Danger to Others None reported or observed    "

## 2024-12-07 NOTE — BH IP Treatment Plan (Signed)
 Interdisciplinary Treatment and Diagnostic Plan Update  12/07/2024 Time of Session: 3:00PM Sarah Sullivan MRN: [id number]  Principal Diagnosis: <principal problem not specified>  Secondary Diagnoses: Active Problems:   Acute psychosis (HCC)   Current Medications:  Current Facility-Administered Medications  Medication Dose Route Frequency Provider Last Rate Last Admin   acetaminophen  (TYLENOL ) tablet 650 mg  650 mg Oral Q6H PRN McLauchlin, Angela, NP   650 mg at 11/24/24 2338   alum & mag hydroxide-simeth (MAALOX/MYLANTA) 200-200-20 MG/5ML suspension 30 mL  30 mL Oral Q4H PRN McLauchlin, Angela, NP   30 mL at 12/04/24 1436   atorvastatin  (LIPITOR ) tablet 80 mg  80 mg Oral Daily Madaram, Kondal R, MD   80 mg at 12/07/24 9076   DULoxetine  (CYMBALTA ) DR capsule 60 mg  60 mg Oral Daily Jadapalle, Sree, MD   60 mg at 12/07/24 9076   feeding supplement (ENSURE PLUS HIGH PROTEIN) liquid 237 mL  237 mL Oral TID BM Jadapalle, Sree, MD   237 mL at 12/07/24 1310   felodipine  (PLENDIL ) 24 hr tablet 10 mg  10 mg Oral Daily Madaram, Kondal R, MD   10 mg at 12/07/24 9076   gabapentin  (NEURONTIN ) capsule 300 mg  300 mg Oral BID Elesa Perkins, RPH   300 mg at 12/07/24 1310   And   gabapentin  (NEURONTIN ) capsule 600 mg  600 mg Oral QHS Elesa Perkins, RPH   600 mg at 12/06/24 2125   irbesartan  (AVAPRO ) tablet 75 mg  75 mg Oral Daily Madaram, Kondal R, MD   75 mg at 12/07/24 9076   magnesium  hydroxide (MILK OF MAGNESIA) suspension 30 mL  30 mL Oral Daily PRN McLauchlin, Angela, NP       mirtazapine  (REMERON ) tablet 15 mg  15 mg Oral QHS Madaram, Kondal R, MD   15 mg at 12/06/24 2125   multivitamin with minerals tablet 1 tablet  1 tablet Oral Daily Jadapalle, Sree, MD   1 tablet at 12/07/24 9076   OLANZapine  zydis (ZYPREXA ) disintegrating tablet 5 mg  5 mg Oral TID PRN McLauchlin, Angela, NP   5 mg at 11/25/24 2318   pantoprazole  (PROTONIX ) EC tablet 40 mg  40 mg Oral BID AC Madaram, Kondal R, MD   40 mg  at 12/07/24 0759   risperiDONE  (RISPERDAL ) tablet 3 mg  3 mg Oral BID Jadapalle, Sree, MD   3 mg at 12/07/24 9076   traZODone  (DESYREL ) tablet 50 mg  50 mg Oral QHS PRN McLauchlin, Angela, NP   50 mg at 12/06/24 2125   PTA Medications: Medications Prior to Admission  Medication Sig Dispense Refill Last Dose/Taking   atorvastatin  (LIPITOR ) 80 MG tablet Take 80 mg by mouth daily.      DULoxetine  (CYMBALTA ) 30 MG capsule Take 30 mg by mouth daily.      felodipine  (PLENDIL ) 10 MG 24 hr tablet Take 10 mg by mouth daily.      gabapentin  (NEURONTIN ) 300 MG capsule Take 300-600 mg by mouth 3 (three) times daily. Take 300 mg (one capsule) by mouth twice daily and 600 mg (two capsules) at bedtime      mirtazapine  (REMERON ) 7.5 MG tablet Take 7.5 mg by mouth at bedtime.      pantoprazole  (PROTONIX ) 40 MG tablet Take 1 tablet (40 mg total) by mouth 2 (two) times daily before a meal. 120 tablet 0    sertraline (ZOLOFT) 100 MG tablet Take 100 mg by mouth daily.      telmisartan (MICARDIS) 40 MG  tablet Take 40 mg by mouth daily.      traZODone  (DESYREL ) 50 MG tablet Take 50 mg by mouth at bedtime.       Patient Stressors:    Patient Strengths:    Treatment Modalities: Medication Management, Group therapy, Case management,  1 to 1 session with clinician, Psychoeducation, Recreational therapy.   Physician Treatment Plan for Primary Diagnosis: <principal problem not specified> Long Term Goal(s):     Short Term Goals:    Medication Management: Evaluate patient's response, side effects, and tolerance of medication regimen.  Therapeutic Interventions: 1 to 1 sessions, Unit Group sessions and Medication administration.  Evaluation of Outcomes: Adequate for Discharge  Physician Treatment Plan for Secondary Diagnosis: Active Problems:   Acute psychosis (HCC)  Long Term Goal(s):     Short Term Goals:       Medication Management: Evaluate patient's response, side effects, and tolerance of  medication regimen.  Therapeutic Interventions: 1 to 1 sessions, Unit Group sessions and Medication administration.  Evaluation of Outcomes: Adequate for Discharge   RN Treatment Plan for Primary Diagnosis: <principal problem not specified> Long Term Goal(s): Knowledge of disease and therapeutic regimen to maintain health will improve  Short Term Goals: Ability to demonstrate self-control, Ability to participate in decision making will improve, Ability to verbalize feelings will improve, Ability to disclose and discuss suicidal ideas, Ability to identify and develop effective coping behaviors will improve, and Compliance with prescribed medications will improve  Medication Management: RN will administer medications as ordered by provider, will assess and evaluate patient's response and provide education to patient for prescribed medication. RN will report any adverse and/or side effects to prescribing provider.  Therapeutic Interventions: 1 on 1 counseling sessions, Psychoeducation, Medication administration, Evaluate responses to treatment, Monitor vital signs and CBGs as ordered, Perform/monitor CIWA, COWS, AIMS and Fall Risk screenings as ordered, Perform wound care treatments as ordered.  Evaluation of Outcomes: Adequate for Discharge   LCSW Treatment Plan for Primary Diagnosis: <principal problem not specified> Long Term Goal(s): Safe transition to appropriate next level of care at discharge, Engage patient in therapeutic group addressing interpersonal concerns.  Short Term Goals: Engage patient in aftercare planning with referrals and resources, Increase social support, Increase ability to appropriately verbalize feelings, Increase emotional regulation, Facilitate acceptance of mental health diagnosis and concerns, and Increase skills for wellness and recovery  Therapeutic Interventions: Assess for all discharge needs, 1 to 1 time with Social worker, Explore available resources and  support systems, Assess for adequacy in community support network, Educate family and significant other(s) on suicide prevention, Complete Psychosocial Assessment, Interpersonal group therapy.  Evaluation of Outcomes: Adequate for Discharge   Progress in Treatment: Attending groups: Yes. Participating in groups: Yes. Taking medication as prescribed: Yes. Toleration medication: Yes. Family/Significant other contact made: Yes, individual(s) contacted:  SPE completed with [name] Maynor, sister in law Patient understands diagnosis: No. Discussing patient identified problems/goals with staff: Yes. Medical problems stabilized or resolved: Yes. Denies suicidal/homicidal ideation: Yes. Issues/concerns per patient self-inventory: No. Other: none  New problem(s) identified: No, Describe:  none identified  Update 12/07/2024: No changes at this time.    New Short Term/Long Term Goal(s):elimination of symptoms of psychosis, medication management for mood stabilization; elimination of SI thoughts; development of comprehensive mental wellness plan.  Update 12/07/2024: No changes at this time.    Patient Goals:  Pt reports she is hearing voices and reports she does not want to, pt has difficulty with speech Update 12/07/2024: No changes at  this time.     Discharge Plan or Barriers:  CSW will assist with appropriate discharge planning.  Update 12/07/2024: Plan is for the patient to return to her home at this time. No barriers identified.    Reason for Continuation of Hospitalization: Hallucinations Medication stabilization   Estimated Length of Stay:1 to 7 days  Update 12/07/2024: TBD  Last 3 [place] Suicide Severity Risk Score: Flowsheet Row Admission (Current) from 11/24/2024 in [facility] Most recent reading at 11/25/2024 10:00 AM ED from 11/24/2024 in [facility] Emergency Department at [facility] Most recent reading at 11/24/2024  2:27 PM  C-SSRS RISK CATEGORY No Risk No  Risk    Last PHQ 2/9 Scores:     No data to display          Scribe for Treatment Team: Sherryle JINNY Margo, LCSW 12/07/2024 4:05 PM

## 2024-12-07 NOTE — Plan of Care (Signed)
" °  Problem: Education: Goal: Ability to state activities that reduce stress will improve Outcome: Progressing   Problem: Coping: Goal: Ability to identify and develop effective coping behavior will improve Outcome: Progressing   Problem: Self-Concept: Goal: Ability to identify factors that promote anxiety will improve Outcome: Progressing Goal: Level of anxiety will decrease Outcome: Progressing Goal: Ability to modify response to factors that promote anxiety will improve Outcome: Progressing   Problem: Education: Goal: Utilization of techniques to improve thought processes will improve Outcome: Progressing Goal: Knowledge of the prescribed therapeutic regimen will improve Outcome: Progressing   Problem: Activity: Goal: Interest or engagement in leisure activities will improve Outcome: Progressing Goal: Imbalance in normal sleep/wake cycle will improve Outcome: Progressing   Problem: Coping: Goal: Coping ability will improve Outcome: Progressing Goal: Will verbalize feelings Outcome: Progressing   Problem: Health Behavior/Discharge Planning: Goal: Ability to make decisions will improve Outcome: Progressing Goal: Compliance with therapeutic regimen will improve Outcome: Progressing   Problem: Role Relationship: Goal: Will demonstrate positive changes in social behaviors and relationships Outcome: Progressing   Problem: Safety: Goal: Ability to disclose and discuss suicidal ideas will improve Outcome: Progressing Goal: Ability to identify and utilize support systems that promote safety will improve Outcome: Progressing   Problem: Self-Concept: Goal: Will verbalize positive feelings about self Outcome: Progressing Goal: Level of anxiety will decrease Outcome: Progressing   Problem: Education: Goal: Knowledge of [place] Education information/materials will improve Outcome: Progressing Goal: Emotional status will improve Outcome: Progressing Goal:  Mental status will improve Outcome: Progressing Goal: Verbalization of understanding the information provided will improve Outcome: Progressing   Problem: Activity: Goal: Interest or engagement in activities will improve Outcome: Progressing Goal: Sleeping patterns will improve Outcome: Progressing   Problem: Coping: Goal: Ability to verbalize frustrations and anger appropriately will improve Outcome: Progressing Goal: Ability to demonstrate self-control will improve Outcome: Progressing   Problem: Health Behavior/Discharge Planning: Goal: Identification of resources available to assist in meeting health care needs will improve Outcome: Progressing Goal: Compliance with treatment plan for underlying cause of condition will improve Outcome: Progressing   Problem: Physical Regulation: Goal: Ability to maintain clinical measurements within normal limits will improve Outcome: Progressing   Problem: Safety: Goal: Periods of time without injury will increase Outcome: Progressing   Problem: Activity: Goal: Will verbalize the importance of balancing activity with adequate rest periods Outcome: Progressing   Problem: Education: Goal: Will be free of psychotic symptoms Outcome: Progressing Goal: Knowledge of the prescribed therapeutic regimen will improve Outcome: Progressing   Problem: Coping: Goal: Coping ability will improve Outcome: Progressing Goal: Will verbalize feelings Outcome: Progressing   Problem: Health Behavior/Discharge Planning: Goal: Compliance with prescribed medication regimen will improve Outcome: Progressing   Problem: Nutritional: Goal: Ability to achieve adequate nutritional intake will improve Outcome: Progressing   Problem: Role Relationship: Goal: Ability to communicate needs accurately will improve Outcome: Progressing Goal: Ability to interact with others will improve Outcome: Progressing   Problem: Safety: Goal: Ability to redirect  hostility and anger into socially appropriate behaviors will improve Outcome: Progressing Goal: Ability to remain free from injury will improve Outcome: Progressing   Problem: Self-Care: Goal: Ability to participate in self-care as condition permits will improve Outcome: Progressing   Problem: Self-Concept: Goal: Will verbalize positive feelings about self Outcome: Progressing   "

## 2024-12-07 NOTE — Group Note (Signed)
 Date:  12/07/2024 Time:  8:40 PM  Group Topic/Focus:  Identifying Needs:   The focus of this group is to help patients identify their personal needs that have been historically problematic and identify healthy behaviors to address their needs.    Participation Level:  Active  Participation Quality:  Appropriate  Affect:  Appropriate  Cognitive:  Appropriate  Insight: Good  Engagement in Group:  Engaged  Modes of Intervention:  Discussion  Additional Comments:    Romero Earnie Hope 12/07/2024, 8:40 PM

## 2024-12-07 NOTE — Plan of Care (Signed)
" °  Problem: Education: Goal: Ability to state activities that reduce stress will improve Outcome: Progressing   Problem: Coping: Goal: Ability to identify and develop effective coping behavior will improve Outcome: Progressing   Problem: Self-Concept: Goal: Level of anxiety will decrease Outcome: Progressing   Problem: Education: Goal: Utilization of techniques to improve thought processes will improve Outcome: Progressing   Problem: Coping: Goal: Coping ability will improve Outcome: Progressing Goal: Will verbalize feelings Outcome: Progressing   "

## 2024-12-08 ENCOUNTER — Other Ambulatory Visit: Payer: Self-pay

## 2024-12-08 DIAGNOSIS — F23 Brief psychotic disorder: Secondary | ICD-10-CM | POA: Diagnosis not present

## 2024-12-08 MED ORDER — DULOXETINE HCL 60 MG PO CPEP
60.0000 mg | ORAL_CAPSULE | Freq: Every day | ORAL | 0 refills | Status: AC
  Filled 2024-12-08: qty 30, 30d supply, fill #0

## 2024-12-08 MED ORDER — PANTOPRAZOLE SODIUM 40 MG PO TBEC
40.0000 mg | DELAYED_RELEASE_TABLET | Freq: Two times a day (BID) | ORAL | 0 refills | Status: AC
  Filled 2024-12-08: qty 120, 60d supply, fill #0

## 2024-12-08 MED ORDER — TRAZODONE HCL 50 MG PO TABS
50.0000 mg | ORAL_TABLET | Freq: Every day | ORAL | 11 refills | Status: AC
  Filled 2024-12-08: qty 30, 30d supply, fill #0

## 2024-12-08 MED ORDER — MIRTAZAPINE 15 MG PO TABS
15.0000 mg | ORAL_TABLET | Freq: Every day | ORAL | 0 refills | Status: AC
  Filled 2024-12-08: qty 30, 30d supply, fill #0

## 2024-12-08 MED ORDER — ADULT MULTIVITAMIN W/MINERALS CH
1.0000 | ORAL_TABLET | Freq: Every day | ORAL | 0 refills | Status: AC
  Filled 2024-12-08: qty 30, 30d supply, fill #0

## 2024-12-08 MED ORDER — IRBESARTAN 75 MG PO TABS
75.0000 mg | ORAL_TABLET | Freq: Every day | ORAL | 0 refills | Status: AC
  Filled 2024-12-08: qty 30, 30d supply, fill #0

## 2024-12-08 MED ORDER — FELODIPINE ER 10 MG PO TB24
10.0000 mg | ORAL_TABLET | Freq: Every day | ORAL | 0 refills | Status: AC
  Filled 2024-12-08: qty 10, 10d supply, fill #0

## 2024-12-08 MED ORDER — ATORVASTATIN CALCIUM 80 MG PO TABS
80.0000 mg | ORAL_TABLET | Freq: Every day | ORAL | 0 refills | Status: AC
  Filled 2024-12-08: qty 30, 30d supply, fill #0

## 2024-12-08 MED ORDER — GABAPENTIN 300 MG PO CAPS
ORAL_CAPSULE | ORAL | 0 refills | Status: AC
  Filled 2024-12-08: qty 120, 30d supply, fill #0

## 2024-12-08 MED ORDER — RISPERIDONE 3 MG PO TABS
3.0000 mg | ORAL_TABLET | Freq: Two times a day (BID) | ORAL | 0 refills | Status: AC
  Filled 2024-12-08: qty 60, 30d supply, fill #0

## 2024-12-08 NOTE — Care Management Important Message (Addendum)
 Important Message    CSW utilized [employer] [phone]  Patient Details  Name: Rosealyn Little MRN: [id number] Date of Birth: 03-27-1946   Important Message Given:  Yes - Medicare IM     Sherryle JINNY Margo, LCSW 12/08/2024, 10:55 AM

## 2024-12-08 NOTE — Discharge Summary (Signed)
 " Physician Discharge Summary Note  Patient:  Sarah Sullivan is an 79 y.o., female MRN:  [id number] DOB:  1946/05/19 Patient phone:  [phone] (home)  Patient address:   [address] [place] [address],   Total time spent: 40 min Date of Admission:  11/24/2024 Date of Discharge: 12/08/2024  Reason for Admission:  Sarah Sullivan is a 79 year old female with a past psychiatric history of depression and mood disturbances, presenting to the ED for worsening auditory hallucinations. She reports hearing voices telling her that people are trying to harm her, and she has been increasingly paranoid. She describes having to whisper while speaking and being hypervigilant at home, checking windows every 15 minutes.Patient is admitted to [facility] unit with Q15 min safety monitoring. Multidisciplinary team approach is offered. Medication management; group/milieu therapy is offered.   Principal Problem: <principal problem not specified> Discharge Diagnoses: MDD , recurrent severe with psychosis   Past Psychiatric History: see h&p  Family Psychiatric  History: see h&p Social History:  Social History   Substance and Sexual Activity  Alcohol Use Not Currently     Social History   Substance and Sexual Activity  Drug Use Not Currently    Social History   Socioeconomic History   Marital status: Married    Spouse name: Not on file   Number of children: Not on file   Years of education: Not on file   Highest education level: Not on file  Occupational History   Not on file  Tobacco Use   Smoking status: Never   Smokeless tobacco: Never  Substance and Sexual Activity   Alcohol use: Not Currently   Drug use: Not Currently   Sexual activity: Not on file  Other Topics Concern   Not on file  Social History Narrative   Not on file   Social Drivers of Health   Tobacco Use: Low Risk (11/25/2024)   Patient History    Smoking Tobacco Use: Never    Smokeless Tobacco Use: Never    Passive Exposure: Not  on file  Financial Resource Strain: Low Risk  (10/18/2024)   Received from [facility]   Overall Financial Resource Strain (CARDIA)    Difficulty of Paying Living Expenses: Not hard at all  Food Insecurity: No Food Insecurity (11/25/2024)   Epic    Worried About Radiation Protection Practitioner of Food in the Last Year: Never true    Ran Out of Food in the Last Year: Never true  Transportation Needs: No Transportation Needs (11/25/2024)   Epic    Lack of Transportation (Medical): No    Lack of Transportation (Non-Medical): No  Physical Activity: Not on file  Stress: Not on file  Social Connections: Socially Isolated (11/25/2024)   Social Connection and Isolation Panel    Frequency of Communication with Friends and Family: Never    Frequency of Social Gatherings with Friends and Family: Never    Attends Religious Services: Never    Database Administrator or Organizations: No    Attends Banker Meetings: Never    Marital Status: Widowed  Depression (PHQ2-9): Not on file  Alcohol Screen: Low Risk (11/24/2024)   Alcohol Screen    Last Alcohol Screening Score (AUDIT): 0  Housing: Low Risk (11/25/2024)   Epic    Unable to Pay for Housing in the Last Year: No    Number of Times Moved in the Last Year: 0    Homeless in the Last Year: No  Utilities: Not  At Risk (11/25/2024)   Epic    Threatened with loss of utilities: No  Health Literacy: Not on file   Past Medical History:  Past Medical History:  Diagnosis Date   Anemia    Hypertension     Past Surgical History:  Procedure Laterality Date   ABDOMINAL HYSTERECTOMY     COLONOSCOPY N/A 04/26/2019   Procedure: COLONOSCOPY;  Surgeon: Unk Corinn Skiff, MD;  Location: [facility] ENDOSCOPY;  Service: Gastroenterology;  Laterality: N/A;   COLONOSCOPY N/A 04/27/2019   Procedure: COLONOSCOPY;  Surgeon: Unk Corinn Skiff, MD;  Location: [facility] ENDOSCOPY;  Service: Gastroenterology;  Laterality: N/A;   ESOPHAGOGASTRODUODENOSCOPY N/A 04/25/2019    Procedure: ESOPHAGOGASTRODUODENOSCOPY (EGD);  Surgeon: Unk Corinn Skiff, MD;  Location: [facility] ENDOSCOPY;  Service: Gastroenterology;  Laterality: N/A;   ESOPHAGOGASTRODUODENOSCOPY N/A 04/26/2019   Procedure: ESOPHAGOGASTRODUODENOSCOPY (EGD);  Surgeon: Unk Corinn Skiff, MD;  Location: [facility] ENDOSCOPY;  Service: Gastroenterology;  Laterality: N/A;   TEMPORARY DIALYSIS CATHETER N/A 04/21/2019   Procedure: TEMPORARY DIALYSIS CATHETER;  Surgeon: Marea Selinda RAMAN, MD;  Location: [facility] INVASIVE CV LAB;  Service: Cardiovascular;  Laterality: N/A;   Family History:  Family History  Problem Relation Age of Onset   Breast cancer Neg Hx     Hospital Course:  Sarah Sullivan is a 79 year old female with a past psychiatric history of depression and mood disturbances, presenting to the ED for worsening auditory hallucinations. She reports hearing voices telling her that people are trying to harm her, and she has been increasingly paranoid. She describes having to whisper while speaking and being hypervigilant at home, checking windows every 15 minutes.Patient is admitted to [facility] unit with Q15 min safety monitoring. Multidisciplinary team approach is offered. Medication management; group/milieu therapy is offered.   On admission, patient was started on Risperdal  and dosage was optimized to 3 mg twice daily for psychosis.  Her home meds is adjusted to Cymbalta  and Remeron  appropriate dosage.  Patient tolerated medications fairly well and showed improvement in her mood.  The hallucinations resolved.  Patient participated in groups and treatment plan with no problems.  Discharge planning and treatment plan was discussed with her family members and patient was discharged back home to her family  Detailed risk assessment is complete based on clinical exam and individual risk factors and acute suicide risk is low and acute violence risk is low.    On the day of discharge, patient denies SI/HI/plan and denies  hallucinations.  Patient remains future oriented and is willing to participate in outpatient mental health services.  Currently, all modifiable risk of harm to self/harm to others have been addressed and patient is no longer appropriate for the acute inpatient setting and is able to continue treatment for mental health needs in the community with the supports as indicated below.  Patient is educated and verbalized understanding of discharge plan of care including medications, follow-up appointments, mental health resources and further crisis services in the community.  He is instructed to call 911 or present to the nearest emergency room should he experience any decompensation in mood, disturbance of bowel or return of suicidal/homicidal ideations.  Patient verbalizes understanding of this education and agrees to this plan of care  Physical Findings: AIMS:  , ,  ,  ,    CIWA:    COWS:      Psychiatric Specialty Exam:  Presentation  General Appearance:  Appropriate for Environment  Eye Contact: Fair  Speech: Clear and Coherent  Speech Volume: Normal  Mood and Affect  Mood: Euthymic  Affect: Appropriate   Thought Process  Thought Processes: Coherent  Descriptions of Associations:Intact  Orientation:Full (Time, Place and Person)  Thought Content:Logical  Hallucinations:Hallucinations: None  Ideas of Reference:None  Suicidal Thoughts:Suicidal Thoughts: No  Homicidal Thoughts:Homicidal Thoughts: No   Sensorium  Memory: Immediate Fair; Recent Fair  Judgment: Fair  Insight: Fair   Art Therapist  Concentration: Fair  Attention Span: Fair  Recall: [employer] of Knowledge: Fair  Language: Fair   Psychomotor Activity  Psychomotor Activity: Psychomotor Activity: Normal  Musculoskeletal: Strength & Muscle Tone: decreased Gait & Station: unsteady Assets  Assets: Manufacturing Systems Engineer; Desire for Improvement; Resilience   Sleep   Sleep: Sleep: Fair    Physical Exam: Physical Exam Vitals and nursing note reviewed.    ROS Blood pressure (!) 177/82, pulse 86, temperature 98 F (36.7 C), resp. rate 16, height 4' 10 (1.473 m), weight 38.8 kg, SpO2 97%. Body mass index is 17.87 kg/m.   Tobacco Use History[1] Tobacco Cessation:  N/A, patient does not currently use tobacco products   Blood Alcohol level:  Lab Results  Component Value Date   [facility] <15 11/24/2024    Metabolic Disorder Labs:  Lab Results  Component Value Date   HGBA1C 5.3 04/29/2019   MPG 105.41 04/29/2019   No results found for: PROLACTIN Lab Results  Component Value Date   CHOL 175 04/29/2019   TRIG 93 04/29/2019   HDL 68 04/29/2019   CHOLHDL 2.6 04/29/2019   VLDL 19 04/29/2019   LDLCALC 88 04/29/2019    See Psychiatric Specialty Exam and Suicide Risk Assessment completed by Attending Physician prior to discharge.  Discharge destination:  Home  Is patient on multiple antipsychotic therapies at discharge:  No   Has Patient had three or more failed trials of antipsychotic monotherapy by history:  No  Recommended Plan for Multiple Antipsychotic Therapies: NA   Allergies as of 12/08/2024       Reactions   Baclofen Other (See Comments)   Excessive drowsiness   Tramadol Other (See Comments)   Patient states causes Hallucinations        Medication List     STOP taking these medications    sertraline 100 MG tablet Commonly known as: ZOLOFT   telmisartan 40 MG tablet Commonly known as: MICARDIS Replaced by: irbesartan  75 MG tablet       TAKE these medications      Indication  atorvastatin  80 MG tablet Commonly known as: LIPITOR  Take 1 tablet (80 mg total) by mouth daily.    DULoxetine  60 MG capsule Commonly known as: CYMBALTA  Take 1 capsule (60 mg total) by mouth daily. What changed:  medication strength how much to take  Indication: Major Depressive Disorder   felodipine  10 MG 24 hr  tablet Commonly known as: PLENDIL  Take 1 tablet (10 mg total) by mouth daily.    gabapentin  300 MG capsule Commonly known as: NEURONTIN  Take 1 capsule (300 mg total) by mouth 2 (two) times daily AND 2 capsules (600 mg total) at bedtime. What changed: See the new instructions.    irbesartan  75 MG tablet Commonly known as: AVAPRO  Take 1 tablet (75 mg total) by mouth daily. Replaces: telmisartan 40 MG tablet  Indication: High Blood Pressure   mirtazapine  15 MG tablet Commonly known as: REMERON  Take 1 tablet (15 mg total) by mouth at bedtime. What changed:  medication strength how much to take  Indication: Major Depressive Disorder   multivitamin with minerals  Tabs tablet Take 1 tablet by mouth daily.    pantoprazole  40 MG tablet Commonly known as: PROTONIX  Take 1 tablet (40 mg total) by mouth 2 (two) times daily before a meal.    risperiDONE  3 MG tablet Commonly known as: RISPERDAL  Take 1 tablet (3 mg total) by mouth 2 (two) times daily.  Indication: Delusions   traZODone  50 MG tablet Commonly known as: DESYREL  Take 1 tablet (50 mg total) by mouth at bedtime.  Indication: Trouble Sleeping         Follow-up recommendations:  Activity:  As tolerated    Signed: Willman Cuny, MD 12/08/2024, 8:03 AM          [1]  Social History Tobacco Use  Smoking Status Never  Smokeless Tobacco Never   "

## 2024-12-08 NOTE — Plan of Care (Signed)
 " Problem: Education: Goal: Ability to state activities that reduce stress will improve Outcome: Adequate for Discharge   Problem: Coping: Goal: Ability to identify and develop effective coping behavior will improve Outcome: Adequate for Discharge   Problem: Self-Concept: Goal: Ability to identify factors that promote anxiety will improve Outcome: Adequate for Discharge Goal: Level of anxiety will decrease Outcome: Adequate for Discharge Goal: Ability to modify response to factors that promote anxiety will improve Outcome: Adequate for Discharge   Problem: Education: Goal: Utilization of techniques to improve thought processes will improve Outcome: Adequate for Discharge Goal: Knowledge of the prescribed therapeutic regimen will improve Outcome: Adequate for Discharge   Problem: Activity: Goal: Interest or engagement in leisure activities will improve Outcome: Adequate for Discharge Goal: Imbalance in normal sleep/wake cycle will improve Outcome: Adequate for Discharge   Problem: Coping: Goal: Coping ability will improve Outcome: Adequate for Discharge Goal: Will verbalize feelings Outcome: Adequate for Discharge   Problem: Health Behavior/Discharge Planning: Goal: Ability to make decisions will improve Outcome: Adequate for Discharge Goal: Compliance with therapeutic regimen will improve Outcome: Adequate for Discharge   Problem: Role Relationship: Goal: Will demonstrate positive changes in social behaviors and relationships Outcome: Adequate for Discharge   Problem: Safety: Goal: Ability to disclose and discuss suicidal ideas will improve Outcome: Adequate for Discharge Goal: Ability to identify and utilize support systems that promote safety will improve Outcome: Adequate for Discharge   Problem: Self-Concept: Goal: Will verbalize positive feelings about self Outcome: Adequate for Discharge Goal: Level of anxiety will decrease Outcome: Adequate for Discharge    Problem: Education: Goal: Knowledge of [facility] Education information/materials will improve Outcome: Adequate for Discharge Goal: Emotional status will improve Outcome: Adequate for Discharge Goal: Mental status will improve Outcome: Adequate for Discharge Goal: Verbalization of understanding the information provided will improve Outcome: Adequate for Discharge   Problem: Activity: Goal: Interest or engagement in activities will improve Outcome: Adequate for Discharge Goal: Sleeping patterns will improve Outcome: Adequate for Discharge   Problem: Coping: Goal: Ability to verbalize frustrations and anger appropriately will improve Outcome: Adequate for Discharge Goal: Ability to demonstrate self-control will improve Outcome: Adequate for Discharge   Problem: Health Behavior/Discharge Planning: Goal: Identification of resources available to assist in meeting health care needs will improve Outcome: Adequate for Discharge Goal: Compliance with treatment plan for underlying cause of condition will improve Outcome: Adequate for Discharge   Problem: Physical Regulation: Goal: Ability to maintain clinical measurements within normal limits will improve Outcome: Adequate for Discharge   Problem: Safety: Goal: Periods of time without injury will increase Outcome: Adequate for Discharge   Problem: Activity: Goal: Will verbalize the importance of balancing activity with adequate rest periods Outcome: Adequate for Discharge   Problem: Education: Goal: Will be free of psychotic symptoms Outcome: Adequate for Discharge Goal: Knowledge of the prescribed therapeutic regimen will improve Outcome: Adequate for Discharge   Problem: Coping: Goal: Coping ability will improve Outcome: Adequate for Discharge Goal: Will verbalize feelings Outcome: Adequate for Discharge   Problem: Health Behavior/Discharge Planning: Goal: Compliance with prescribed medication regimen will  improve Outcome: Adequate for Discharge   Problem: Nutritional: Goal: Ability to achieve adequate nutritional intake will improve Outcome: Adequate for Discharge   Problem: Role Relationship: Goal: Ability to communicate needs accurately will improve Outcome: Adequate for Discharge Goal: Ability to interact with others will improve Outcome: Adequate for Discharge   Problem: Safety: Goal: Ability to redirect hostility and anger into socially appropriate behaviors will improve Outcome:  Adequate for Discharge Goal: Ability to remain free from injury will improve Outcome: Adequate for Discharge   Problem: Self-Care: Goal: Ability to participate in self-care as condition permits will improve Outcome: Adequate for Discharge   Problem: Self-Concept: Goal: Will verbalize positive feelings about self Outcome: Adequate for Discharge   Problem: Acute Rehab PT Goals(only PT should resolve) Goal: Pt Will Go Up/Down Stairs Outcome: Adequate for Discharge   "

## 2024-12-08 NOTE — BHH Suicide Risk Assessment (Signed)
 [facility] Discharge Suicide Risk Assessment   Principal Problem: <principal problem not specified> Discharge Diagnoses: Active Problems:   Acute psychosis (HCC)   Total Time spent with patient: 30 minutes  Musculoskeletal: Strength & Muscle Tone: within normal limits Gait & Station: normal Patient leans: N/A  Psychiatric Specialty Exam  Presentation  General Appearance:  Appropriate for Environment  Eye Contact: Fair  Speech: Clear and Coherent  Speech Volume: Normal  Handedness: Right   Mood and Affect  Mood: Euthymic  Duration of Depression Symptoms: Greater than two weeks  Affect: Appropriate   Thought Process  Thought Processes: Coherent  Descriptions of Associations:Intact  Orientation:Full (Time, Place and Person)  Thought Content:Logical  History of Schizophrenia/Schizoaffective disorder:No  Duration of Psychotic Symptoms:Less than six months  Hallucinations:Hallucinations: None  Ideas of Reference:None  Suicidal Thoughts:Suicidal Thoughts: No  Homicidal Thoughts:Homicidal Thoughts: No   Sensorium  Memory: Immediate Fair; Recent Fair  Judgment: Fair  Insight: Fair   Art Therapist  Concentration: Fair  Attention Span: Fair  Recall: [employer] of Knowledge: Fair  Language: Fair   Psychomotor Activity  Psychomotor Activity: Psychomotor Activity: Normal   Assets  Assets: Communication Skills; Desire for Improvement; Resilience   Sleep  Sleep: Sleep: Fair  Estimated Sleeping Duration (Last 24 Hours): 7.25-9.25 hours  Physical Exam: Physical Exam Vitals and nursing note reviewed.    ROS Blood pressure (!) 177/82, pulse 86, temperature 98 F (36.7 C), resp. rate 16, height 4' 10 (1.473 m), weight 38.8 kg, SpO2 97%. Body mass index is 17.87 kg/m.  Mental Status Per Nursing Assessment::   On Admission:  NA  Demographic Factors:  Low socioeconomic status  Loss Factors: Decrease in vocational  status  Historical Factors: Impulsivity  Risk Reduction Factors:   Living with another person, especially a relative, Positive social support, Positive therapeutic relationship, and Positive coping skills or problem solving skills  Continued Clinical Symptoms:  Currently Psychotic  Cognitive Features That Contribute To Risk:  None    Suicide Risk:  Minimal: No identifiable suicidal ideation.  Patients presenting with no risk factors but with morbid ruminations; may be classified as minimal risk based on the severity of the depressive symptoms    Plan Of Care/Follow-up recommendations:  Activity:  as tolerated  Allyn Foil, MD 12/08/2024, 8:02 AM

## 2024-12-08 NOTE — Plan of Care (Signed)
" °  Problem: Education: Goal: Ability to state activities that reduce stress will improve Outcome: Progressing   Problem: Coping: Goal: Ability to identify and develop effective coping behavior will improve Outcome: Progressing   Problem: Education: Goal: Utilization of techniques to improve thought processes will improve Outcome: Progressing Goal: Knowledge of the prescribed therapeutic regimen will improve Outcome: Progressing   Problem: Activity: Goal: Interest or engagement in leisure activities will improve Outcome: Progressing Goal: Imbalance in normal sleep/wake cycle will improve Outcome: Progressing   Problem: Coping: Goal: Coping ability will improve Outcome: Progressing   Problem: Health Behavior/Discharge Planning: Goal: Ability to make decisions will improve Outcome: Progressing   Problem: Safety: Goal: Ability to identify and utilize support systems that promote safety will improve Outcome: Progressing   "

## 2024-12-08 NOTE — BHH Group Notes (Signed)
 Physical/Occupational Therapy Group Note   Group Topic: Pain Management and Coping   Group Date: 12/08/2024 Start Time: 1300 End Time: 1345 Facilitators: Clive Warren CROME, OT    Group Description:  Group discussed impact of chronic/acute pain on safety and independence with functional tasks and impact on mental health.  Identified and discussed any previously learned or implemented strategies used.  Discussed and reviewed cognitive behavioral pain coping strategies to address/improve overall management of pain. Discussed relaxation, distraction techniques, cognitive restructuring, activity pacing/energy conservation, environment/home safety modifications, and role of sleep and sleep hygiene. Allowed time for questions and further discussion.      Therapeutic Goal(s):   Identify and discuss previously utilized pain coping strategies and implications of pain on function/well-being Identify and discuss implementing new cognitive behavioral pain coping strategies into daily routines Demonstrate understanding and performance of learned cognitive behavioral pain coping strategies.    Individual Participation: Pt present for for first 1/3 of session. Independently went to get pens for each participant at start of session. Alert and engaged but left intermittently during session.    Participation Level: Minimal    Participation Quality: Minimal Cues    Behavior: Alert     Speech/Thought Process: Appropriate    Affect/Mood: Appropriate    Insight: Limited    Judgement: Limited    Modes of Intervention: Activity, Clarification, Discussion, Education, Exploration, Problem-solving, Rapport Building, Socialization, and Support  Patient Response to Interventions:  Disengaged    Plan: Continue to engage patient in PT/OT groups 1 - 2x/week.   Shearon Clonch R., MPH, MS, OTR/L ascom [phone] 12/08/24, 4:20 PM

## 2024-12-08 NOTE — Progress Notes (Addendum)
 Patient clear and responsive during interaction. Decline Language Line. Agrees for nurse to share teaching to family at pick up. Denies SI/HI/AVH. Patient discharged complete. Patient declined suicide safety plan. Completed survey, AVS and discharge paperwork. Patient dressed in street clothes. Medications from pharmacy. Patient met with provider and SW. Call made to family. Ongoing monitoring continues. Awaiting family for ride home.

## 2024-12-08 NOTE — Progress Notes (Signed)
"  °  Patient was pleasant. Tolerated medications without issues. Denied SI/HI/AVH. Slept for 6.5 hours.   12/07/24 2100  Psych Admission Type (Psych Patients Only)  Admission Status Voluntary  Psychosocial Assessment  Patient Complaints None  Eye Contact Fair  Facial Expression Animated  Affect Appropriate to circumstance  Speech Soft  Interaction Assertive  Motor Activity Slow  Appearance/Hygiene In scrubs  Behavior Characteristics Cooperative  Mood Pleasant  Thought Process  Coherency Circumstantial  Content WDL  Delusions None reported or observed  Perception WDL  Hallucination None reported or observed  Judgment Impaired  Confusion Mild  Danger to Self  Current suicidal ideation? Denies  Danger to Others  Danger to Others None reported or observed    "

## 2024-12-08 NOTE — Progress Notes (Signed)
" °  [facility] Adult Case Management Discharge Plan :  Will you be returning to the same living situation after discharge:  Yes,  pt reports that she is returning to her home.  Sister reports that pt will be moving into a tiny house at her home.  At discharge, do you have transportation home?: Yes,  sister will provide transportation.  Do you have the ability to pay for your medications: Yes,  [employer] MEDICARE / [employer] MEDICARE HMO  Release of information consent forms completed and in the chart;  Patient's signature needed at discharge.  Patient to Follow up at:  Follow-up Information     [facility] Follow up.   Why: Appointment is scheduled for 12/29/2024 at 9:30AM with Dr. Nilda  Please arrive 20 minutes early for appointment to complete paperwork. Contact information: [address] [place],  [place], [place] [address] [phone]                Next level of care provider has access to [facility] Link:no  Safety Planning and Suicide Prevention discussed: Yes,  SPE completed with the patient and patient's sister.      Has patient been referred to the Quitline?: Patient does not use tobacco/nicotine products  Patient has been referred for addiction treatment: No known substance use disorder.  Sherryle JINNY Margo, LCSW 12/08/2024, 11:17 AM "

## 2024-12-08 NOTE — Progress Notes (Signed)
 Patient escorted off the unit by RN. Discharge teaching with family member, sister in law (SIL), [name] Maynor. Medications and paperwork given to  SIL. Patient left with family with no signs of distress.

## 2024-12-08 NOTE — Group Note (Signed)
 Date:  12/08/2024 Time:  10:02 AM  Group Topic/Focus:   Goals Group:   The focus of this group is to help patients establish daily goals to achieve during treatment and discuss how the patient can incorporate goal setting into their daily lives to aide in recovery.    Participation Level:  Minimal  Participation Quality:  Appropriate and Attentive  Affect:  Appropriate  Cognitive:  Alert  Insight: Appropriate  Engagement in Group:  Engaged  Modes of Intervention:  Activity and Discussion  Additional Comments:     Maglione,Rolla Servidio E 12/08/2024, 10:02 AM
